# Patient Record
Sex: Female | Born: 1935 | Race: White | Hispanic: No | Marital: Married | Smoking: Former smoker
Health system: Southern US, Community
[De-identification: ages and names within clinical notes are randomized; demographics above are authoritative.]

## PROBLEM LIST (undated history)

## (undated) DIAGNOSIS — I480 Paroxysmal atrial fibrillation: Secondary | ICD-10-CM

## (undated) DIAGNOSIS — E785 Hyperlipidemia, unspecified: Secondary | ICD-10-CM

## (undated) DIAGNOSIS — C189 Malignant neoplasm of colon, unspecified: Secondary | ICD-10-CM

## (undated) DIAGNOSIS — E039 Hypothyroidism, unspecified: Secondary | ICD-10-CM

## (undated) DIAGNOSIS — I6529 Occlusion and stenosis of unspecified carotid artery: Secondary | ICD-10-CM

## (undated) DIAGNOSIS — M1990 Unspecified osteoarthritis, unspecified site: Secondary | ICD-10-CM

## (undated) DIAGNOSIS — Z808 Family history of malignant neoplasm of other organs or systems: Secondary | ICD-10-CM

## (undated) DIAGNOSIS — Z87891 Personal history of nicotine dependence: Secondary | ICD-10-CM

## (undated) DIAGNOSIS — Z8673 Personal history of transient ischemic attack (TIA), and cerebral infarction without residual deficits: Secondary | ICD-10-CM

## (undated) DIAGNOSIS — Z952 Presence of prosthetic heart valve: Secondary | ICD-10-CM

## (undated) DIAGNOSIS — I350 Nonrheumatic aortic (valve) stenosis: Secondary | ICD-10-CM

## (undated) DIAGNOSIS — I4510 Unspecified right bundle-branch block: Secondary | ICD-10-CM

## (undated) DIAGNOSIS — I10 Essential (primary) hypertension: Secondary | ICD-10-CM

## (undated) DIAGNOSIS — I5032 Chronic diastolic (congestive) heart failure: Secondary | ICD-10-CM

## (undated) DIAGNOSIS — I739 Peripheral vascular disease, unspecified: Secondary | ICD-10-CM

## (undated) HISTORY — DX: Family history of malignant neoplasm of other organs or systems: Z80.8

## (undated) HISTORY — DX: Personal history of nicotine dependence: Z87.891

## (undated) HISTORY — DX: Peripheral vascular disease, unspecified: I73.9

## (undated) HISTORY — PX: CARDIAC CATHETERIZATION: SHX172

## (undated) HISTORY — DX: Hypothyroidism, unspecified: E03.9

## (undated) HISTORY — PX: TONSILLECTOMY: SUR1361

## (undated) HISTORY — DX: Essential (primary) hypertension: I10

## (undated) HISTORY — DX: Malignant neoplasm of colon, unspecified: C18.9

## (undated) HISTORY — DX: Occlusion and stenosis of unspecified carotid artery: I65.29

## (undated) HISTORY — DX: Unspecified osteoarthritis, unspecified site: M19.90

## (undated) HISTORY — DX: Paroxysmal atrial fibrillation: I48.0

## (undated) HISTORY — DX: Hyperlipidemia, unspecified: E78.5

---

## 1996-01-12 HISTORY — PX: KNEE SURGERY: SHX244

## 1999-01-12 HISTORY — PX: COLONOSCOPY: SHX174

## 2003-01-12 HISTORY — PX: HIP SURGERY: SHX245

## 2009-01-11 DIAGNOSIS — Z8673 Personal history of transient ischemic attack (TIA), and cerebral infarction without residual deficits: Secondary | ICD-10-CM

## 2009-01-11 HISTORY — DX: Personal history of transient ischemic attack (TIA), and cerebral infarction without residual deficits: Z86.73

## 2015-01-20 DIAGNOSIS — D6851 Activated protein C resistance: Secondary | ICD-10-CM | POA: Diagnosis not present

## 2015-01-20 DIAGNOSIS — Z7901 Long term (current) use of anticoagulants: Secondary | ICD-10-CM | POA: Diagnosis not present

## 2015-01-23 DIAGNOSIS — E7801 Familial hypercholesterolemia: Secondary | ICD-10-CM | POA: Diagnosis not present

## 2015-01-23 DIAGNOSIS — E871 Hypo-osmolality and hyponatremia: Secondary | ICD-10-CM | POA: Diagnosis not present

## 2015-01-23 DIAGNOSIS — Z7901 Long term (current) use of anticoagulants: Secondary | ICD-10-CM | POA: Diagnosis not present

## 2015-01-23 DIAGNOSIS — E039 Hypothyroidism, unspecified: Secondary | ICD-10-CM | POA: Diagnosis not present

## 2015-02-04 DIAGNOSIS — M1712 Unilateral primary osteoarthritis, left knee: Secondary | ICD-10-CM | POA: Diagnosis not present

## 2015-02-11 DIAGNOSIS — M79674 Pain in right toe(s): Secondary | ICD-10-CM | POA: Diagnosis not present

## 2015-02-11 DIAGNOSIS — M79675 Pain in left toe(s): Secondary | ICD-10-CM | POA: Diagnosis not present

## 2015-02-11 DIAGNOSIS — B351 Tinea unguium: Secondary | ICD-10-CM | POA: Diagnosis not present

## 2015-02-26 DIAGNOSIS — L299 Pruritus, unspecified: Secondary | ICD-10-CM | POA: Diagnosis not present

## 2015-02-26 DIAGNOSIS — L821 Other seborrheic keratosis: Secondary | ICD-10-CM | POA: Diagnosis not present

## 2015-03-18 DIAGNOSIS — D6851 Activated protein C resistance: Secondary | ICD-10-CM | POA: Diagnosis not present

## 2015-03-18 DIAGNOSIS — Z7901 Long term (current) use of anticoagulants: Secondary | ICD-10-CM | POA: Diagnosis not present

## 2015-04-03 DIAGNOSIS — Z483 Aftercare following surgery for neoplasm: Secondary | ICD-10-CM | POA: Diagnosis not present

## 2015-04-03 DIAGNOSIS — Z85828 Personal history of other malignant neoplasm of skin: Secondary | ICD-10-CM | POA: Diagnosis not present

## 2015-04-15 DIAGNOSIS — D151 Benign neoplasm of heart: Secondary | ICD-10-CM | POA: Diagnosis not present

## 2015-04-15 DIAGNOSIS — I350 Nonrheumatic aortic (valve) stenosis: Secondary | ICD-10-CM | POA: Diagnosis not present

## 2015-04-15 DIAGNOSIS — I352 Nonrheumatic aortic (valve) stenosis with insufficiency: Secondary | ICD-10-CM | POA: Diagnosis not present

## 2015-04-15 DIAGNOSIS — I340 Nonrheumatic mitral (valve) insufficiency: Secondary | ICD-10-CM | POA: Diagnosis not present

## 2015-04-15 DIAGNOSIS — I10 Essential (primary) hypertension: Secondary | ICD-10-CM | POA: Diagnosis not present

## 2015-04-15 DIAGNOSIS — I342 Nonrheumatic mitral (valve) stenosis: Secondary | ICD-10-CM | POA: Diagnosis not present

## 2015-04-23 DIAGNOSIS — M79674 Pain in right toe(s): Secondary | ICD-10-CM | POA: Diagnosis not present

## 2015-04-23 DIAGNOSIS — M79675 Pain in left toe(s): Secondary | ICD-10-CM | POA: Diagnosis not present

## 2015-04-23 DIAGNOSIS — B351 Tinea unguium: Secondary | ICD-10-CM | POA: Diagnosis not present

## 2015-05-05 DIAGNOSIS — L821 Other seborrheic keratosis: Secondary | ICD-10-CM | POA: Diagnosis not present

## 2015-05-05 DIAGNOSIS — L299 Pruritus, unspecified: Secondary | ICD-10-CM | POA: Diagnosis not present

## 2015-05-05 DIAGNOSIS — L110 Acquired keratosis follicularis: Secondary | ICD-10-CM | POA: Diagnosis not present

## 2015-05-12 DIAGNOSIS — D6851 Activated protein C resistance: Secondary | ICD-10-CM | POA: Diagnosis not present

## 2015-05-12 DIAGNOSIS — Z7901 Long term (current) use of anticoagulants: Secondary | ICD-10-CM | POA: Diagnosis not present

## 2015-06-03 DIAGNOSIS — D2371 Other benign neoplasm of skin of right lower limb, including hip: Secondary | ICD-10-CM | POA: Diagnosis not present

## 2015-06-03 DIAGNOSIS — Z85828 Personal history of other malignant neoplasm of skin: Secondary | ICD-10-CM | POA: Diagnosis not present

## 2015-06-23 DIAGNOSIS — I350 Nonrheumatic aortic (valve) stenosis: Secondary | ICD-10-CM | POA: Diagnosis not present

## 2015-06-23 DIAGNOSIS — D151 Benign neoplasm of heart: Secondary | ICD-10-CM | POA: Diagnosis not present

## 2015-06-23 DIAGNOSIS — I340 Nonrheumatic mitral (valve) insufficiency: Secondary | ICD-10-CM | POA: Diagnosis not present

## 2015-06-23 DIAGNOSIS — I10 Essential (primary) hypertension: Secondary | ICD-10-CM | POA: Diagnosis not present

## 2015-07-02 DIAGNOSIS — M79674 Pain in right toe(s): Secondary | ICD-10-CM | POA: Diagnosis not present

## 2015-07-02 DIAGNOSIS — B351 Tinea unguium: Secondary | ICD-10-CM | POA: Diagnosis not present

## 2015-07-02 DIAGNOSIS — M79675 Pain in left toe(s): Secondary | ICD-10-CM | POA: Diagnosis not present

## 2015-07-17 DIAGNOSIS — D6851 Activated protein C resistance: Secondary | ICD-10-CM | POA: Diagnosis not present

## 2015-07-17 DIAGNOSIS — Z7901 Long term (current) use of anticoagulants: Secondary | ICD-10-CM | POA: Diagnosis not present

## 2015-07-21 DIAGNOSIS — E669 Obesity, unspecified: Secondary | ICD-10-CM | POA: Diagnosis not present

## 2015-07-21 DIAGNOSIS — Z8673 Personal history of transient ischemic attack (TIA), and cerebral infarction without residual deficits: Secondary | ICD-10-CM | POA: Diagnosis not present

## 2015-07-21 DIAGNOSIS — E7800 Pure hypercholesterolemia, unspecified: Secondary | ICD-10-CM | POA: Diagnosis not present

## 2015-07-21 DIAGNOSIS — I340 Nonrheumatic mitral (valve) insufficiency: Secondary | ICD-10-CM | POA: Diagnosis not present

## 2015-07-21 DIAGNOSIS — Z6837 Body mass index (BMI) 37.0-37.9, adult: Secondary | ICD-10-CM | POA: Diagnosis not present

## 2015-07-21 DIAGNOSIS — M549 Dorsalgia, unspecified: Secondary | ICD-10-CM | POA: Diagnosis not present

## 2015-07-21 DIAGNOSIS — Z79899 Other long term (current) drug therapy: Secondary | ICD-10-CM | POA: Diagnosis not present

## 2015-07-21 DIAGNOSIS — W19XXXA Unspecified fall, initial encounter: Secondary | ICD-10-CM | POA: Diagnosis not present

## 2015-07-21 DIAGNOSIS — N390 Urinary tract infection, site not specified: Secondary | ICD-10-CM | POA: Diagnosis not present

## 2015-07-21 DIAGNOSIS — Y9289 Other specified places as the place of occurrence of the external cause: Secondary | ICD-10-CM | POA: Diagnosis not present

## 2015-07-21 DIAGNOSIS — I350 Nonrheumatic aortic (valve) stenosis: Secondary | ICD-10-CM | POA: Diagnosis not present

## 2015-07-21 DIAGNOSIS — S32019A Unspecified fracture of first lumbar vertebra, initial encounter for closed fracture: Secondary | ICD-10-CM | POA: Diagnosis not present

## 2015-07-21 DIAGNOSIS — E039 Hypothyroidism, unspecified: Secondary | ICD-10-CM | POA: Diagnosis not present

## 2015-07-21 DIAGNOSIS — S32000A Wedge compression fracture of unspecified lumbar vertebra, initial encounter for closed fracture: Secondary | ICD-10-CM | POA: Diagnosis not present

## 2015-07-21 DIAGNOSIS — Y9301 Activity, walking, marching and hiking: Secondary | ICD-10-CM | POA: Diagnosis not present

## 2015-07-21 DIAGNOSIS — M545 Low back pain: Secondary | ICD-10-CM | POA: Diagnosis not present

## 2015-07-21 DIAGNOSIS — I10 Essential (primary) hypertension: Secondary | ICD-10-CM | POA: Diagnosis not present

## 2015-07-21 DIAGNOSIS — S32010A Wedge compression fracture of first lumbar vertebra, initial encounter for closed fracture: Secondary | ICD-10-CM | POA: Diagnosis not present

## 2015-07-21 DIAGNOSIS — W010XXA Fall on same level from slipping, tripping and stumbling without subsequent striking against object, initial encounter: Secondary | ICD-10-CM | POA: Diagnosis not present

## 2015-07-21 DIAGNOSIS — Z7901 Long term (current) use of anticoagulants: Secondary | ICD-10-CM | POA: Diagnosis not present

## 2015-07-22 DIAGNOSIS — S32010D Wedge compression fracture of first lumbar vertebra, subsequent encounter for fracture with routine healing: Secondary | ICD-10-CM | POA: Diagnosis not present

## 2015-07-23 DIAGNOSIS — S32010A Wedge compression fracture of first lumbar vertebra, initial encounter for closed fracture: Secondary | ICD-10-CM | POA: Diagnosis not present

## 2015-07-24 DIAGNOSIS — N390 Urinary tract infection, site not specified: Secondary | ICD-10-CM | POA: Diagnosis not present

## 2015-07-24 DIAGNOSIS — E785 Hyperlipidemia, unspecified: Secondary | ICD-10-CM | POA: Diagnosis not present

## 2015-07-24 DIAGNOSIS — I69331 Monoplegia of upper limb following cerebral infarction affecting right dominant side: Secondary | ICD-10-CM | POA: Diagnosis not present

## 2015-07-24 DIAGNOSIS — Z7901 Long term (current) use of anticoagulants: Secondary | ICD-10-CM | POA: Diagnosis not present

## 2015-07-24 DIAGNOSIS — I080 Rheumatic disorders of both mitral and aortic valves: Secondary | ICD-10-CM | POA: Diagnosis not present

## 2015-07-24 DIAGNOSIS — S32010D Wedge compression fracture of first lumbar vertebra, subsequent encounter for fracture with routine healing: Secondary | ICD-10-CM | POA: Diagnosis not present

## 2015-07-24 DIAGNOSIS — I10 Essential (primary) hypertension: Secondary | ICD-10-CM | POA: Diagnosis not present

## 2015-07-24 DIAGNOSIS — E039 Hypothyroidism, unspecified: Secondary | ICD-10-CM | POA: Diagnosis not present

## 2015-07-24 DIAGNOSIS — M5137 Other intervertebral disc degeneration, lumbosacral region: Secondary | ICD-10-CM | POA: Diagnosis not present

## 2015-07-24 DIAGNOSIS — D151 Benign neoplasm of heart: Secondary | ICD-10-CM | POA: Diagnosis not present

## 2015-07-24 DIAGNOSIS — Z9181 History of falling: Secondary | ICD-10-CM | POA: Diagnosis not present

## 2015-07-25 DIAGNOSIS — M5137 Other intervertebral disc degeneration, lumbosacral region: Secondary | ICD-10-CM | POA: Diagnosis not present

## 2015-07-25 DIAGNOSIS — N390 Urinary tract infection, site not specified: Secondary | ICD-10-CM | POA: Diagnosis not present

## 2015-07-25 DIAGNOSIS — D151 Benign neoplasm of heart: Secondary | ICD-10-CM | POA: Diagnosis not present

## 2015-07-25 DIAGNOSIS — I080 Rheumatic disorders of both mitral and aortic valves: Secondary | ICD-10-CM | POA: Diagnosis not present

## 2015-07-25 DIAGNOSIS — Z7901 Long term (current) use of anticoagulants: Secondary | ICD-10-CM | POA: Diagnosis not present

## 2015-07-25 DIAGNOSIS — S32010D Wedge compression fracture of first lumbar vertebra, subsequent encounter for fracture with routine healing: Secondary | ICD-10-CM | POA: Diagnosis not present

## 2015-07-25 DIAGNOSIS — E039 Hypothyroidism, unspecified: Secondary | ICD-10-CM | POA: Diagnosis not present

## 2015-07-25 DIAGNOSIS — I69331 Monoplegia of upper limb following cerebral infarction affecting right dominant side: Secondary | ICD-10-CM | POA: Diagnosis not present

## 2015-07-28 DIAGNOSIS — S32010D Wedge compression fracture of first lumbar vertebra, subsequent encounter for fracture with routine healing: Secondary | ICD-10-CM | POA: Diagnosis not present

## 2015-07-28 DIAGNOSIS — N390 Urinary tract infection, site not specified: Secondary | ICD-10-CM | POA: Diagnosis not present

## 2015-07-28 DIAGNOSIS — I69331 Monoplegia of upper limb following cerebral infarction affecting right dominant side: Secondary | ICD-10-CM | POA: Diagnosis not present

## 2015-07-28 DIAGNOSIS — D151 Benign neoplasm of heart: Secondary | ICD-10-CM | POA: Diagnosis not present

## 2015-07-28 DIAGNOSIS — I080 Rheumatic disorders of both mitral and aortic valves: Secondary | ICD-10-CM | POA: Diagnosis not present

## 2015-07-28 DIAGNOSIS — Z7901 Long term (current) use of anticoagulants: Secondary | ICD-10-CM | POA: Diagnosis not present

## 2015-07-28 DIAGNOSIS — M5137 Other intervertebral disc degeneration, lumbosacral region: Secondary | ICD-10-CM | POA: Diagnosis not present

## 2015-07-29 DIAGNOSIS — S32010D Wedge compression fracture of first lumbar vertebra, subsequent encounter for fracture with routine healing: Secondary | ICD-10-CM | POA: Diagnosis not present

## 2015-07-29 DIAGNOSIS — I69331 Monoplegia of upper limb following cerebral infarction affecting right dominant side: Secondary | ICD-10-CM | POA: Diagnosis not present

## 2015-07-29 DIAGNOSIS — D151 Benign neoplasm of heart: Secondary | ICD-10-CM | POA: Diagnosis not present

## 2015-07-29 DIAGNOSIS — I080 Rheumatic disorders of both mitral and aortic valves: Secondary | ICD-10-CM | POA: Diagnosis not present

## 2015-07-29 DIAGNOSIS — N390 Urinary tract infection, site not specified: Secondary | ICD-10-CM | POA: Diagnosis not present

## 2015-07-29 DIAGNOSIS — M5137 Other intervertebral disc degeneration, lumbosacral region: Secondary | ICD-10-CM | POA: Diagnosis not present

## 2015-07-30 DIAGNOSIS — S32010A Wedge compression fracture of first lumbar vertebra, initial encounter for closed fracture: Secondary | ICD-10-CM | POA: Diagnosis not present

## 2015-07-30 DIAGNOSIS — W1839XA Other fall on same level, initial encounter: Secondary | ICD-10-CM | POA: Diagnosis not present

## 2015-07-31 DIAGNOSIS — D151 Benign neoplasm of heart: Secondary | ICD-10-CM | POA: Diagnosis not present

## 2015-07-31 DIAGNOSIS — I69331 Monoplegia of upper limb following cerebral infarction affecting right dominant side: Secondary | ICD-10-CM | POA: Diagnosis not present

## 2015-07-31 DIAGNOSIS — S32010D Wedge compression fracture of first lumbar vertebra, subsequent encounter for fracture with routine healing: Secondary | ICD-10-CM | POA: Diagnosis not present

## 2015-07-31 DIAGNOSIS — I080 Rheumatic disorders of both mitral and aortic valves: Secondary | ICD-10-CM | POA: Diagnosis not present

## 2015-07-31 DIAGNOSIS — M5137 Other intervertebral disc degeneration, lumbosacral region: Secondary | ICD-10-CM | POA: Diagnosis not present

## 2015-07-31 DIAGNOSIS — N390 Urinary tract infection, site not specified: Secondary | ICD-10-CM | POA: Diagnosis not present

## 2015-08-01 DIAGNOSIS — S32010D Wedge compression fracture of first lumbar vertebra, subsequent encounter for fracture with routine healing: Secondary | ICD-10-CM | POA: Diagnosis not present

## 2015-08-01 DIAGNOSIS — N390 Urinary tract infection, site not specified: Secondary | ICD-10-CM | POA: Diagnosis not present

## 2015-08-01 DIAGNOSIS — M5137 Other intervertebral disc degeneration, lumbosacral region: Secondary | ICD-10-CM | POA: Diagnosis not present

## 2015-08-01 DIAGNOSIS — I69331 Monoplegia of upper limb following cerebral infarction affecting right dominant side: Secondary | ICD-10-CM | POA: Diagnosis not present

## 2015-08-01 DIAGNOSIS — I080 Rheumatic disorders of both mitral and aortic valves: Secondary | ICD-10-CM | POA: Diagnosis not present

## 2015-08-01 DIAGNOSIS — D151 Benign neoplasm of heart: Secondary | ICD-10-CM | POA: Diagnosis not present

## 2015-08-04 DIAGNOSIS — S32010D Wedge compression fracture of first lumbar vertebra, subsequent encounter for fracture with routine healing: Secondary | ICD-10-CM | POA: Diagnosis not present

## 2015-08-04 DIAGNOSIS — I69331 Monoplegia of upper limb following cerebral infarction affecting right dominant side: Secondary | ICD-10-CM | POA: Diagnosis not present

## 2015-08-04 DIAGNOSIS — M5137 Other intervertebral disc degeneration, lumbosacral region: Secondary | ICD-10-CM | POA: Diagnosis not present

## 2015-08-04 DIAGNOSIS — N390 Urinary tract infection, site not specified: Secondary | ICD-10-CM | POA: Diagnosis not present

## 2015-08-04 DIAGNOSIS — D151 Benign neoplasm of heart: Secondary | ICD-10-CM | POA: Diagnosis not present

## 2015-08-04 DIAGNOSIS — I080 Rheumatic disorders of both mitral and aortic valves: Secondary | ICD-10-CM | POA: Diagnosis not present

## 2015-08-05 DIAGNOSIS — N390 Urinary tract infection, site not specified: Secondary | ICD-10-CM | POA: Diagnosis not present

## 2015-08-05 DIAGNOSIS — I69331 Monoplegia of upper limb following cerebral infarction affecting right dominant side: Secondary | ICD-10-CM | POA: Diagnosis not present

## 2015-08-05 DIAGNOSIS — I080 Rheumatic disorders of both mitral and aortic valves: Secondary | ICD-10-CM | POA: Diagnosis not present

## 2015-08-05 DIAGNOSIS — S32010D Wedge compression fracture of first lumbar vertebra, subsequent encounter for fracture with routine healing: Secondary | ICD-10-CM | POA: Diagnosis not present

## 2015-08-05 DIAGNOSIS — M5137 Other intervertebral disc degeneration, lumbosacral region: Secondary | ICD-10-CM | POA: Diagnosis not present

## 2015-08-05 DIAGNOSIS — D151 Benign neoplasm of heart: Secondary | ICD-10-CM | POA: Diagnosis not present

## 2015-08-06 DIAGNOSIS — M5137 Other intervertebral disc degeneration, lumbosacral region: Secondary | ICD-10-CM | POA: Diagnosis not present

## 2015-08-06 DIAGNOSIS — I69331 Monoplegia of upper limb following cerebral infarction affecting right dominant side: Secondary | ICD-10-CM | POA: Diagnosis not present

## 2015-08-06 DIAGNOSIS — S32010D Wedge compression fracture of first lumbar vertebra, subsequent encounter for fracture with routine healing: Secondary | ICD-10-CM | POA: Diagnosis not present

## 2015-08-06 DIAGNOSIS — I080 Rheumatic disorders of both mitral and aortic valves: Secondary | ICD-10-CM | POA: Diagnosis not present

## 2015-08-06 DIAGNOSIS — D151 Benign neoplasm of heart: Secondary | ICD-10-CM | POA: Diagnosis not present

## 2015-08-06 DIAGNOSIS — N390 Urinary tract infection, site not specified: Secondary | ICD-10-CM | POA: Diagnosis not present

## 2015-08-07 DIAGNOSIS — N390 Urinary tract infection, site not specified: Secondary | ICD-10-CM | POA: Diagnosis not present

## 2015-08-07 DIAGNOSIS — S32010D Wedge compression fracture of first lumbar vertebra, subsequent encounter for fracture with routine healing: Secondary | ICD-10-CM | POA: Diagnosis not present

## 2015-08-07 DIAGNOSIS — I080 Rheumatic disorders of both mitral and aortic valves: Secondary | ICD-10-CM | POA: Diagnosis not present

## 2015-08-07 DIAGNOSIS — D151 Benign neoplasm of heart: Secondary | ICD-10-CM | POA: Diagnosis not present

## 2015-08-07 DIAGNOSIS — M5137 Other intervertebral disc degeneration, lumbosacral region: Secondary | ICD-10-CM | POA: Diagnosis not present

## 2015-08-07 DIAGNOSIS — I69331 Monoplegia of upper limb following cerebral infarction affecting right dominant side: Secondary | ICD-10-CM | POA: Diagnosis not present

## 2015-08-08 DIAGNOSIS — I080 Rheumatic disorders of both mitral and aortic valves: Secondary | ICD-10-CM | POA: Diagnosis not present

## 2015-08-08 DIAGNOSIS — N390 Urinary tract infection, site not specified: Secondary | ICD-10-CM | POA: Diagnosis not present

## 2015-08-08 DIAGNOSIS — M5137 Other intervertebral disc degeneration, lumbosacral region: Secondary | ICD-10-CM | POA: Diagnosis not present

## 2015-08-08 DIAGNOSIS — S32010D Wedge compression fracture of first lumbar vertebra, subsequent encounter for fracture with routine healing: Secondary | ICD-10-CM | POA: Diagnosis not present

## 2015-08-08 DIAGNOSIS — D151 Benign neoplasm of heart: Secondary | ICD-10-CM | POA: Diagnosis not present

## 2015-08-08 DIAGNOSIS — I69331 Monoplegia of upper limb following cerebral infarction affecting right dominant side: Secondary | ICD-10-CM | POA: Diagnosis not present

## 2015-08-11 DIAGNOSIS — I080 Rheumatic disorders of both mitral and aortic valves: Secondary | ICD-10-CM | POA: Diagnosis not present

## 2015-08-11 DIAGNOSIS — S32010D Wedge compression fracture of first lumbar vertebra, subsequent encounter for fracture with routine healing: Secondary | ICD-10-CM | POA: Diagnosis not present

## 2015-08-11 DIAGNOSIS — D151 Benign neoplasm of heart: Secondary | ICD-10-CM | POA: Diagnosis not present

## 2015-08-11 DIAGNOSIS — M5137 Other intervertebral disc degeneration, lumbosacral region: Secondary | ICD-10-CM | POA: Diagnosis not present

## 2015-08-11 DIAGNOSIS — I69331 Monoplegia of upper limb following cerebral infarction affecting right dominant side: Secondary | ICD-10-CM | POA: Diagnosis not present

## 2015-08-11 DIAGNOSIS — N390 Urinary tract infection, site not specified: Secondary | ICD-10-CM | POA: Diagnosis not present

## 2015-08-13 DIAGNOSIS — S32010D Wedge compression fracture of first lumbar vertebra, subsequent encounter for fracture with routine healing: Secondary | ICD-10-CM | POA: Diagnosis not present

## 2015-08-13 DIAGNOSIS — N390 Urinary tract infection, site not specified: Secondary | ICD-10-CM | POA: Diagnosis not present

## 2015-08-13 DIAGNOSIS — M5137 Other intervertebral disc degeneration, lumbosacral region: Secondary | ICD-10-CM | POA: Diagnosis not present

## 2015-08-13 DIAGNOSIS — I69331 Monoplegia of upper limb following cerebral infarction affecting right dominant side: Secondary | ICD-10-CM | POA: Diagnosis not present

## 2015-08-13 DIAGNOSIS — D151 Benign neoplasm of heart: Secondary | ICD-10-CM | POA: Diagnosis not present

## 2015-08-13 DIAGNOSIS — I080 Rheumatic disorders of both mitral and aortic valves: Secondary | ICD-10-CM | POA: Diagnosis not present

## 2015-08-14 DIAGNOSIS — M5137 Other intervertebral disc degeneration, lumbosacral region: Secondary | ICD-10-CM | POA: Diagnosis not present

## 2015-08-14 DIAGNOSIS — I080 Rheumatic disorders of both mitral and aortic valves: Secondary | ICD-10-CM | POA: Diagnosis not present

## 2015-08-14 DIAGNOSIS — S32010D Wedge compression fracture of first lumbar vertebra, subsequent encounter for fracture with routine healing: Secondary | ICD-10-CM | POA: Diagnosis not present

## 2015-08-14 DIAGNOSIS — N390 Urinary tract infection, site not specified: Secondary | ICD-10-CM | POA: Diagnosis not present

## 2015-08-14 DIAGNOSIS — I69331 Monoplegia of upper limb following cerebral infarction affecting right dominant side: Secondary | ICD-10-CM | POA: Diagnosis not present

## 2015-08-14 DIAGNOSIS — D151 Benign neoplasm of heart: Secondary | ICD-10-CM | POA: Diagnosis not present

## 2015-08-15 DIAGNOSIS — I69331 Monoplegia of upper limb following cerebral infarction affecting right dominant side: Secondary | ICD-10-CM | POA: Diagnosis not present

## 2015-08-15 DIAGNOSIS — D151 Benign neoplasm of heart: Secondary | ICD-10-CM | POA: Diagnosis not present

## 2015-08-15 DIAGNOSIS — N390 Urinary tract infection, site not specified: Secondary | ICD-10-CM | POA: Diagnosis not present

## 2015-08-15 DIAGNOSIS — S32010D Wedge compression fracture of first lumbar vertebra, subsequent encounter for fracture with routine healing: Secondary | ICD-10-CM | POA: Diagnosis not present

## 2015-08-15 DIAGNOSIS — M5137 Other intervertebral disc degeneration, lumbosacral region: Secondary | ICD-10-CM | POA: Diagnosis not present

## 2015-08-15 DIAGNOSIS — I080 Rheumatic disorders of both mitral and aortic valves: Secondary | ICD-10-CM | POA: Diagnosis not present

## 2015-08-19 DIAGNOSIS — M5137 Other intervertebral disc degeneration, lumbosacral region: Secondary | ICD-10-CM | POA: Diagnosis not present

## 2015-08-19 DIAGNOSIS — N390 Urinary tract infection, site not specified: Secondary | ICD-10-CM | POA: Diagnosis not present

## 2015-08-19 DIAGNOSIS — D151 Benign neoplasm of heart: Secondary | ICD-10-CM | POA: Diagnosis not present

## 2015-08-19 DIAGNOSIS — S32010D Wedge compression fracture of first lumbar vertebra, subsequent encounter for fracture with routine healing: Secondary | ICD-10-CM | POA: Diagnosis not present

## 2015-08-19 DIAGNOSIS — I69331 Monoplegia of upper limb following cerebral infarction affecting right dominant side: Secondary | ICD-10-CM | POA: Diagnosis not present

## 2015-08-19 DIAGNOSIS — I080 Rheumatic disorders of both mitral and aortic valves: Secondary | ICD-10-CM | POA: Diagnosis not present

## 2015-08-20 DIAGNOSIS — I080 Rheumatic disorders of both mitral and aortic valves: Secondary | ICD-10-CM | POA: Diagnosis not present

## 2015-08-20 DIAGNOSIS — S32010D Wedge compression fracture of first lumbar vertebra, subsequent encounter for fracture with routine healing: Secondary | ICD-10-CM | POA: Diagnosis not present

## 2015-08-20 DIAGNOSIS — M5137 Other intervertebral disc degeneration, lumbosacral region: Secondary | ICD-10-CM | POA: Diagnosis not present

## 2015-08-20 DIAGNOSIS — D151 Benign neoplasm of heart: Secondary | ICD-10-CM | POA: Diagnosis not present

## 2015-08-20 DIAGNOSIS — N390 Urinary tract infection, site not specified: Secondary | ICD-10-CM | POA: Diagnosis not present

## 2015-08-20 DIAGNOSIS — I69331 Monoplegia of upper limb following cerebral infarction affecting right dominant side: Secondary | ICD-10-CM | POA: Diagnosis not present

## 2015-08-21 DIAGNOSIS — M5137 Other intervertebral disc degeneration, lumbosacral region: Secondary | ICD-10-CM | POA: Diagnosis not present

## 2015-08-21 DIAGNOSIS — D151 Benign neoplasm of heart: Secondary | ICD-10-CM | POA: Diagnosis not present

## 2015-08-21 DIAGNOSIS — I69331 Monoplegia of upper limb following cerebral infarction affecting right dominant side: Secondary | ICD-10-CM | POA: Diagnosis not present

## 2015-08-21 DIAGNOSIS — S32010D Wedge compression fracture of first lumbar vertebra, subsequent encounter for fracture with routine healing: Secondary | ICD-10-CM | POA: Diagnosis not present

## 2015-08-21 DIAGNOSIS — I080 Rheumatic disorders of both mitral and aortic valves: Secondary | ICD-10-CM | POA: Diagnosis not present

## 2015-08-21 DIAGNOSIS — N390 Urinary tract infection, site not specified: Secondary | ICD-10-CM | POA: Diagnosis not present

## 2015-08-25 DIAGNOSIS — E871 Hypo-osmolality and hyponatremia: Secondary | ICD-10-CM | POA: Diagnosis not present

## 2015-08-25 DIAGNOSIS — E7801 Familial hypercholesterolemia: Secondary | ICD-10-CM | POA: Diagnosis not present

## 2015-08-25 DIAGNOSIS — S32010D Wedge compression fracture of first lumbar vertebra, subsequent encounter for fracture with routine healing: Secondary | ICD-10-CM | POA: Diagnosis not present

## 2015-08-25 DIAGNOSIS — M545 Low back pain: Secondary | ICD-10-CM | POA: Diagnosis not present

## 2015-08-25 DIAGNOSIS — M5137 Other intervertebral disc degeneration, lumbosacral region: Secondary | ICD-10-CM | POA: Diagnosis not present

## 2015-08-25 DIAGNOSIS — E039 Hypothyroidism, unspecified: Secondary | ICD-10-CM | POA: Diagnosis not present

## 2015-08-25 DIAGNOSIS — S32000D Wedge compression fracture of unspecified lumbar vertebra, subsequent encounter for fracture with routine healing: Secondary | ICD-10-CM | POA: Diagnosis not present

## 2015-08-25 DIAGNOSIS — D151 Benign neoplasm of heart: Secondary | ICD-10-CM | POA: Diagnosis not present

## 2015-08-25 DIAGNOSIS — I080 Rheumatic disorders of both mitral and aortic valves: Secondary | ICD-10-CM | POA: Diagnosis not present

## 2015-08-25 DIAGNOSIS — I69359 Hemiplegia and hemiparesis following cerebral infarction affecting unspecified side: Secondary | ICD-10-CM | POA: Diagnosis not present

## 2015-08-25 DIAGNOSIS — I69331 Monoplegia of upper limb following cerebral infarction affecting right dominant side: Secondary | ICD-10-CM | POA: Diagnosis not present

## 2015-08-25 DIAGNOSIS — N390 Urinary tract infection, site not specified: Secondary | ICD-10-CM | POA: Diagnosis not present

## 2015-08-25 DIAGNOSIS — Z23 Encounter for immunization: Secondary | ICD-10-CM | POA: Diagnosis not present

## 2015-08-28 DIAGNOSIS — S32010D Wedge compression fracture of first lumbar vertebra, subsequent encounter for fracture with routine healing: Secondary | ICD-10-CM | POA: Diagnosis not present

## 2015-08-28 DIAGNOSIS — N390 Urinary tract infection, site not specified: Secondary | ICD-10-CM | POA: Diagnosis not present

## 2015-08-28 DIAGNOSIS — I080 Rheumatic disorders of both mitral and aortic valves: Secondary | ICD-10-CM | POA: Diagnosis not present

## 2015-08-28 DIAGNOSIS — D151 Benign neoplasm of heart: Secondary | ICD-10-CM | POA: Diagnosis not present

## 2015-08-28 DIAGNOSIS — M5137 Other intervertebral disc degeneration, lumbosacral region: Secondary | ICD-10-CM | POA: Diagnosis not present

## 2015-08-28 DIAGNOSIS — I69331 Monoplegia of upper limb following cerebral infarction affecting right dominant side: Secondary | ICD-10-CM | POA: Diagnosis not present

## 2015-09-01 DIAGNOSIS — D151 Benign neoplasm of heart: Secondary | ICD-10-CM | POA: Diagnosis not present

## 2015-09-01 DIAGNOSIS — M5137 Other intervertebral disc degeneration, lumbosacral region: Secondary | ICD-10-CM | POA: Diagnosis not present

## 2015-09-01 DIAGNOSIS — S32010D Wedge compression fracture of first lumbar vertebra, subsequent encounter for fracture with routine healing: Secondary | ICD-10-CM | POA: Diagnosis not present

## 2015-09-01 DIAGNOSIS — I69331 Monoplegia of upper limb following cerebral infarction affecting right dominant side: Secondary | ICD-10-CM | POA: Diagnosis not present

## 2015-09-01 DIAGNOSIS — I080 Rheumatic disorders of both mitral and aortic valves: Secondary | ICD-10-CM | POA: Diagnosis not present

## 2015-09-01 DIAGNOSIS — N390 Urinary tract infection, site not specified: Secondary | ICD-10-CM | POA: Diagnosis not present

## 2015-09-04 DIAGNOSIS — M5137 Other intervertebral disc degeneration, lumbosacral region: Secondary | ICD-10-CM | POA: Diagnosis not present

## 2015-09-04 DIAGNOSIS — N390 Urinary tract infection, site not specified: Secondary | ICD-10-CM | POA: Diagnosis not present

## 2015-09-04 DIAGNOSIS — D151 Benign neoplasm of heart: Secondary | ICD-10-CM | POA: Diagnosis not present

## 2015-09-04 DIAGNOSIS — S32010D Wedge compression fracture of first lumbar vertebra, subsequent encounter for fracture with routine healing: Secondary | ICD-10-CM | POA: Diagnosis not present

## 2015-09-04 DIAGNOSIS — I080 Rheumatic disorders of both mitral and aortic valves: Secondary | ICD-10-CM | POA: Diagnosis not present

## 2015-09-04 DIAGNOSIS — I69331 Monoplegia of upper limb following cerebral infarction affecting right dominant side: Secondary | ICD-10-CM | POA: Diagnosis not present

## 2015-09-08 DIAGNOSIS — D6851 Activated protein C resistance: Secondary | ICD-10-CM | POA: Diagnosis not present

## 2015-09-08 DIAGNOSIS — Z7901 Long term (current) use of anticoagulants: Secondary | ICD-10-CM | POA: Diagnosis not present

## 2015-09-10 DIAGNOSIS — I69398 Other sequelae of cerebral infarction: Secondary | ICD-10-CM | POA: Diagnosis not present

## 2015-09-10 DIAGNOSIS — S32010D Wedge compression fracture of first lumbar vertebra, subsequent encounter for fracture with routine healing: Secondary | ICD-10-CM | POA: Diagnosis not present

## 2015-09-10 DIAGNOSIS — R29818 Other symptoms and signs involving the nervous system: Secondary | ICD-10-CM | POA: Diagnosis not present

## 2015-09-11 DIAGNOSIS — S32010D Wedge compression fracture of first lumbar vertebra, subsequent encounter for fracture with routine healing: Secondary | ICD-10-CM | POA: Diagnosis not present

## 2015-09-11 DIAGNOSIS — M545 Low back pain: Secondary | ICD-10-CM | POA: Diagnosis not present

## 2015-09-11 DIAGNOSIS — R2681 Unsteadiness on feet: Secondary | ICD-10-CM | POA: Diagnosis not present

## 2015-09-16 DIAGNOSIS — M79674 Pain in right toe(s): Secondary | ICD-10-CM | POA: Diagnosis not present

## 2015-09-16 DIAGNOSIS — B351 Tinea unguium: Secondary | ICD-10-CM | POA: Diagnosis not present

## 2015-09-16 DIAGNOSIS — M79675 Pain in left toe(s): Secondary | ICD-10-CM | POA: Diagnosis not present

## 2015-09-17 DIAGNOSIS — S32010D Wedge compression fracture of first lumbar vertebra, subsequent encounter for fracture with routine healing: Secondary | ICD-10-CM | POA: Diagnosis not present

## 2015-09-17 DIAGNOSIS — R2681 Unsteadiness on feet: Secondary | ICD-10-CM | POA: Diagnosis not present

## 2015-09-17 DIAGNOSIS — M545 Low back pain: Secondary | ICD-10-CM | POA: Diagnosis not present

## 2015-09-20 DIAGNOSIS — S32010D Wedge compression fracture of first lumbar vertebra, subsequent encounter for fracture with routine healing: Secondary | ICD-10-CM | POA: Diagnosis not present

## 2015-09-20 DIAGNOSIS — M545 Low back pain: Secondary | ICD-10-CM | POA: Diagnosis not present

## 2015-09-20 DIAGNOSIS — R2681 Unsteadiness on feet: Secondary | ICD-10-CM | POA: Diagnosis not present

## 2015-09-22 DIAGNOSIS — S32010D Wedge compression fracture of first lumbar vertebra, subsequent encounter for fracture with routine healing: Secondary | ICD-10-CM | POA: Diagnosis not present

## 2015-09-22 DIAGNOSIS — M545 Low back pain: Secondary | ICD-10-CM | POA: Diagnosis not present

## 2015-09-22 DIAGNOSIS — R2681 Unsteadiness on feet: Secondary | ICD-10-CM | POA: Diagnosis not present

## 2015-09-24 DIAGNOSIS — M545 Low back pain: Secondary | ICD-10-CM | POA: Diagnosis not present

## 2015-09-24 DIAGNOSIS — R2681 Unsteadiness on feet: Secondary | ICD-10-CM | POA: Diagnosis not present

## 2015-09-24 DIAGNOSIS — S32010D Wedge compression fracture of first lumbar vertebra, subsequent encounter for fracture with routine healing: Secondary | ICD-10-CM | POA: Diagnosis not present

## 2015-09-29 DIAGNOSIS — R2681 Unsteadiness on feet: Secondary | ICD-10-CM | POA: Diagnosis not present

## 2015-09-29 DIAGNOSIS — S32010D Wedge compression fracture of first lumbar vertebra, subsequent encounter for fracture with routine healing: Secondary | ICD-10-CM | POA: Diagnosis not present

## 2015-09-29 DIAGNOSIS — M545 Low back pain: Secondary | ICD-10-CM | POA: Diagnosis not present

## 2015-10-01 DIAGNOSIS — M545 Low back pain: Secondary | ICD-10-CM | POA: Diagnosis not present

## 2015-10-01 DIAGNOSIS — S32010D Wedge compression fracture of first lumbar vertebra, subsequent encounter for fracture with routine healing: Secondary | ICD-10-CM | POA: Diagnosis not present

## 2015-10-01 DIAGNOSIS — R2681 Unsteadiness on feet: Secondary | ICD-10-CM | POA: Diagnosis not present

## 2015-10-06 DIAGNOSIS — R2681 Unsteadiness on feet: Secondary | ICD-10-CM | POA: Diagnosis not present

## 2015-10-06 DIAGNOSIS — S32010D Wedge compression fracture of first lumbar vertebra, subsequent encounter for fracture with routine healing: Secondary | ICD-10-CM | POA: Diagnosis not present

## 2015-10-06 DIAGNOSIS — M545 Low back pain: Secondary | ICD-10-CM | POA: Diagnosis not present

## 2015-10-31 DIAGNOSIS — Z09 Encounter for follow-up examination after completed treatment for conditions other than malignant neoplasm: Secondary | ICD-10-CM | POA: Diagnosis not present

## 2015-10-31 DIAGNOSIS — Z96642 Presence of left artificial hip joint: Secondary | ICD-10-CM | POA: Diagnosis not present

## 2015-11-03 DIAGNOSIS — Z7901 Long term (current) use of anticoagulants: Secondary | ICD-10-CM | POA: Diagnosis not present

## 2015-11-03 DIAGNOSIS — D6851 Activated protein C resistance: Secondary | ICD-10-CM | POA: Diagnosis not present

## 2015-11-18 DIAGNOSIS — M1712 Unilateral primary osteoarthritis, left knee: Secondary | ICD-10-CM | POA: Diagnosis not present

## 2015-11-25 DIAGNOSIS — M79675 Pain in left toe(s): Secondary | ICD-10-CM | POA: Diagnosis not present

## 2015-11-25 DIAGNOSIS — B351 Tinea unguium: Secondary | ICD-10-CM | POA: Diagnosis not present

## 2015-11-25 DIAGNOSIS — M79674 Pain in right toe(s): Secondary | ICD-10-CM | POA: Diagnosis not present

## 2015-12-31 DIAGNOSIS — D6851 Activated protein C resistance: Secondary | ICD-10-CM | POA: Diagnosis not present

## 2015-12-31 DIAGNOSIS — Z7901 Long term (current) use of anticoagulants: Secondary | ICD-10-CM | POA: Diagnosis not present

## 2016-01-12 HISTORY — PX: BASAL CELL CARCINOMA EXCISION: SHX1214

## 2016-02-03 DIAGNOSIS — M79674 Pain in right toe(s): Secondary | ICD-10-CM | POA: Diagnosis not present

## 2016-02-03 DIAGNOSIS — B351 Tinea unguium: Secondary | ICD-10-CM | POA: Diagnosis not present

## 2016-02-03 DIAGNOSIS — M79675 Pain in left toe(s): Secondary | ICD-10-CM | POA: Diagnosis not present

## 2016-02-24 DIAGNOSIS — Z7901 Long term (current) use of anticoagulants: Secondary | ICD-10-CM | POA: Diagnosis not present

## 2016-02-24 DIAGNOSIS — D6851 Activated protein C resistance: Secondary | ICD-10-CM | POA: Diagnosis not present

## 2016-02-25 DIAGNOSIS — R197 Diarrhea, unspecified: Secondary | ICD-10-CM | POA: Diagnosis not present

## 2016-02-25 DIAGNOSIS — I7389 Other specified peripheral vascular diseases: Secondary | ICD-10-CM | POA: Diagnosis not present

## 2016-02-25 DIAGNOSIS — I63032 Cerebral infarction due to thrombosis of left carotid artery: Secondary | ICD-10-CM | POA: Diagnosis not present

## 2016-02-25 DIAGNOSIS — Z0001 Encounter for general adult medical examination with abnormal findings: Secondary | ICD-10-CM | POA: Diagnosis not present

## 2016-02-25 DIAGNOSIS — E039 Hypothyroidism, unspecified: Secondary | ICD-10-CM | POA: Diagnosis not present

## 2016-02-25 DIAGNOSIS — B372 Candidiasis of skin and nail: Secondary | ICD-10-CM | POA: Diagnosis not present

## 2016-02-25 DIAGNOSIS — E7801 Familial hypercholesterolemia: Secondary | ICD-10-CM | POA: Diagnosis not present

## 2016-02-25 DIAGNOSIS — Z7901 Long term (current) use of anticoagulants: Secondary | ICD-10-CM | POA: Diagnosis not present

## 2016-02-25 DIAGNOSIS — E871 Hypo-osmolality and hyponatremia: Secondary | ICD-10-CM | POA: Diagnosis not present

## 2016-04-07 DIAGNOSIS — H40013 Open angle with borderline findings, low risk, bilateral: Secondary | ICD-10-CM | POA: Diagnosis not present

## 2016-04-07 DIAGNOSIS — Z961 Presence of intraocular lens: Secondary | ICD-10-CM | POA: Diagnosis not present

## 2016-04-13 DIAGNOSIS — B351 Tinea unguium: Secondary | ICD-10-CM | POA: Diagnosis not present

## 2016-04-13 DIAGNOSIS — M79675 Pain in left toe(s): Secondary | ICD-10-CM | POA: Diagnosis not present

## 2016-04-13 DIAGNOSIS — M79674 Pain in right toe(s): Secondary | ICD-10-CM | POA: Diagnosis not present

## 2016-04-19 DIAGNOSIS — Z7901 Long term (current) use of anticoagulants: Secondary | ICD-10-CM | POA: Diagnosis not present

## 2016-04-20 DIAGNOSIS — Z7901 Long term (current) use of anticoagulants: Secondary | ICD-10-CM | POA: Diagnosis not present

## 2016-04-20 DIAGNOSIS — D6851 Activated protein C resistance: Secondary | ICD-10-CM | POA: Diagnosis not present

## 2016-04-26 DIAGNOSIS — D485 Neoplasm of uncertain behavior of skin: Secondary | ICD-10-CM | POA: Diagnosis not present

## 2016-04-26 DIAGNOSIS — Z85828 Personal history of other malignant neoplasm of skin: Secondary | ICD-10-CM | POA: Diagnosis not present

## 2016-04-29 DIAGNOSIS — Z7901 Long term (current) use of anticoagulants: Secondary | ICD-10-CM | POA: Diagnosis not present

## 2016-04-29 DIAGNOSIS — D151 Benign neoplasm of heart: Secondary | ICD-10-CM | POA: Diagnosis not present

## 2016-04-29 DIAGNOSIS — I10 Essential (primary) hypertension: Secondary | ICD-10-CM | POA: Diagnosis not present

## 2016-04-29 DIAGNOSIS — I350 Nonrheumatic aortic (valve) stenosis: Secondary | ICD-10-CM | POA: Diagnosis not present

## 2016-04-29 DIAGNOSIS — I340 Nonrheumatic mitral (valve) insufficiency: Secondary | ICD-10-CM | POA: Diagnosis not present

## 2016-05-17 DIAGNOSIS — Z7901 Long term (current) use of anticoagulants: Secondary | ICD-10-CM | POA: Diagnosis not present

## 2016-06-14 DIAGNOSIS — Z7901 Long term (current) use of anticoagulants: Secondary | ICD-10-CM | POA: Diagnosis not present

## 2016-06-14 DIAGNOSIS — D6851 Activated protein C resistance: Secondary | ICD-10-CM | POA: Diagnosis not present

## 2016-06-22 DIAGNOSIS — M79674 Pain in right toe(s): Secondary | ICD-10-CM | POA: Diagnosis not present

## 2016-06-22 DIAGNOSIS — M79675 Pain in left toe(s): Secondary | ICD-10-CM | POA: Diagnosis not present

## 2016-06-22 DIAGNOSIS — B351 Tinea unguium: Secondary | ICD-10-CM | POA: Diagnosis not present

## 2016-07-12 DIAGNOSIS — Z7901 Long term (current) use of anticoagulants: Secondary | ICD-10-CM | POA: Diagnosis not present

## 2016-08-10 DIAGNOSIS — Z7901 Long term (current) use of anticoagulants: Secondary | ICD-10-CM | POA: Diagnosis not present

## 2016-08-10 DIAGNOSIS — D6851 Activated protein C resistance: Secondary | ICD-10-CM | POA: Diagnosis not present

## 2016-08-31 DIAGNOSIS — B351 Tinea unguium: Secondary | ICD-10-CM | POA: Diagnosis not present

## 2016-08-31 DIAGNOSIS — M79674 Pain in right toe(s): Secondary | ICD-10-CM | POA: Diagnosis not present

## 2016-08-31 DIAGNOSIS — M79675 Pain in left toe(s): Secondary | ICD-10-CM | POA: Diagnosis not present

## 2016-09-20 DIAGNOSIS — Z7901 Long term (current) use of anticoagulants: Secondary | ICD-10-CM | POA: Diagnosis not present

## 2016-10-04 DIAGNOSIS — D6851 Activated protein C resistance: Secondary | ICD-10-CM | POA: Diagnosis not present

## 2016-10-04 DIAGNOSIS — Z7901 Long term (current) use of anticoagulants: Secondary | ICD-10-CM | POA: Diagnosis not present

## 2016-10-06 DIAGNOSIS — M545 Low back pain: Secondary | ICD-10-CM | POA: Diagnosis not present

## 2016-10-15 DIAGNOSIS — M2560 Stiffness of unspecified joint, not elsewhere classified: Secondary | ICD-10-CM | POA: Diagnosis not present

## 2016-10-15 DIAGNOSIS — M545 Low back pain: Secondary | ICD-10-CM | POA: Diagnosis not present

## 2016-10-15 DIAGNOSIS — R2681 Unsteadiness on feet: Secondary | ICD-10-CM | POA: Diagnosis not present

## 2016-10-17 DIAGNOSIS — Z23 Encounter for immunization: Secondary | ICD-10-CM | POA: Diagnosis not present

## 2016-10-22 DIAGNOSIS — M2560 Stiffness of unspecified joint, not elsewhere classified: Secondary | ICD-10-CM | POA: Diagnosis not present

## 2016-10-22 DIAGNOSIS — R2681 Unsteadiness on feet: Secondary | ICD-10-CM | POA: Diagnosis not present

## 2016-10-22 DIAGNOSIS — M545 Low back pain: Secondary | ICD-10-CM | POA: Diagnosis not present

## 2016-10-26 DIAGNOSIS — R2681 Unsteadiness on feet: Secondary | ICD-10-CM | POA: Diagnosis not present

## 2016-10-26 DIAGNOSIS — M2560 Stiffness of unspecified joint, not elsewhere classified: Secondary | ICD-10-CM | POA: Diagnosis not present

## 2016-10-26 DIAGNOSIS — M545 Low back pain: Secondary | ICD-10-CM | POA: Diagnosis not present

## 2016-10-28 DIAGNOSIS — M545 Low back pain: Secondary | ICD-10-CM | POA: Diagnosis not present

## 2016-10-28 DIAGNOSIS — M2560 Stiffness of unspecified joint, not elsewhere classified: Secondary | ICD-10-CM | POA: Diagnosis not present

## 2016-10-28 DIAGNOSIS — R2681 Unsteadiness on feet: Secondary | ICD-10-CM | POA: Diagnosis not present

## 2016-11-01 DIAGNOSIS — Z7901 Long term (current) use of anticoagulants: Secondary | ICD-10-CM | POA: Diagnosis not present

## 2016-11-09 DIAGNOSIS — M2560 Stiffness of unspecified joint, not elsewhere classified: Secondary | ICD-10-CM | POA: Diagnosis not present

## 2016-11-09 DIAGNOSIS — R2681 Unsteadiness on feet: Secondary | ICD-10-CM | POA: Diagnosis not present

## 2016-11-09 DIAGNOSIS — D485 Neoplasm of uncertain behavior of skin: Secondary | ICD-10-CM | POA: Diagnosis not present

## 2016-11-09 DIAGNOSIS — M545 Low back pain: Secondary | ICD-10-CM | POA: Diagnosis not present

## 2016-11-11 DIAGNOSIS — R2681 Unsteadiness on feet: Secondary | ICD-10-CM | POA: Diagnosis not present

## 2016-11-11 DIAGNOSIS — M2560 Stiffness of unspecified joint, not elsewhere classified: Secondary | ICD-10-CM | POA: Diagnosis not present

## 2016-11-11 DIAGNOSIS — M545 Low back pain: Secondary | ICD-10-CM | POA: Diagnosis not present

## 2016-11-16 DIAGNOSIS — B351 Tinea unguium: Secondary | ICD-10-CM | POA: Diagnosis not present

## 2016-11-16 DIAGNOSIS — M79675 Pain in left toe(s): Secondary | ICD-10-CM | POA: Diagnosis not present

## 2016-11-16 DIAGNOSIS — M79674 Pain in right toe(s): Secondary | ICD-10-CM | POA: Diagnosis not present

## 2016-11-17 DIAGNOSIS — C4491 Basal cell carcinoma of skin, unspecified: Secondary | ICD-10-CM | POA: Diagnosis not present

## 2016-11-17 DIAGNOSIS — C44311 Basal cell carcinoma of skin of nose: Secondary | ICD-10-CM | POA: Diagnosis not present

## 2016-11-22 DIAGNOSIS — Z7901 Long term (current) use of anticoagulants: Secondary | ICD-10-CM | POA: Diagnosis not present

## 2016-11-24 DIAGNOSIS — Z859 Personal history of malignant neoplasm, unspecified: Secondary | ICD-10-CM | POA: Diagnosis not present

## 2016-11-24 DIAGNOSIS — C44311 Basal cell carcinoma of skin of nose: Secondary | ICD-10-CM | POA: Diagnosis not present

## 2016-12-06 DIAGNOSIS — Z7901 Long term (current) use of anticoagulants: Secondary | ICD-10-CM | POA: Diagnosis not present

## 2016-12-21 DIAGNOSIS — Z7901 Long term (current) use of anticoagulants: Secondary | ICD-10-CM | POA: Diagnosis not present

## 2016-12-21 DIAGNOSIS — D6851 Activated protein C resistance: Secondary | ICD-10-CM | POA: Diagnosis not present

## 2017-01-25 DIAGNOSIS — B351 Tinea unguium: Secondary | ICD-10-CM | POA: Diagnosis not present

## 2017-01-25 DIAGNOSIS — M79674 Pain in right toe(s): Secondary | ICD-10-CM | POA: Diagnosis not present

## 2017-01-25 DIAGNOSIS — M79675 Pain in left toe(s): Secondary | ICD-10-CM | POA: Diagnosis not present

## 2017-02-18 DIAGNOSIS — E7801 Familial hypercholesterolemia: Secondary | ICD-10-CM | POA: Diagnosis not present

## 2017-02-18 DIAGNOSIS — I69359 Hemiplegia and hemiparesis following cerebral infarction affecting unspecified side: Secondary | ICD-10-CM | POA: Diagnosis not present

## 2017-02-18 DIAGNOSIS — I10 Essential (primary) hypertension: Secondary | ICD-10-CM | POA: Diagnosis not present

## 2017-02-18 DIAGNOSIS — E039 Hypothyroidism, unspecified: Secondary | ICD-10-CM | POA: Diagnosis not present

## 2017-02-18 DIAGNOSIS — Z7901 Long term (current) use of anticoagulants: Secondary | ICD-10-CM | POA: Diagnosis not present

## 2017-02-18 DIAGNOSIS — Z6839 Body mass index (BMI) 39.0-39.9, adult: Secondary | ICD-10-CM | POA: Diagnosis not present

## 2017-02-18 DIAGNOSIS — I350 Nonrheumatic aortic (valve) stenosis: Secondary | ICD-10-CM | POA: Diagnosis not present

## 2017-02-18 DIAGNOSIS — B372 Candidiasis of skin and nail: Secondary | ICD-10-CM | POA: Diagnosis not present

## 2017-02-18 DIAGNOSIS — E871 Hypo-osmolality and hyponatremia: Secondary | ICD-10-CM | POA: Diagnosis not present

## 2017-02-21 DIAGNOSIS — D6851 Activated protein C resistance: Secondary | ICD-10-CM | POA: Diagnosis not present

## 2017-02-21 DIAGNOSIS — Z7901 Long term (current) use of anticoagulants: Secondary | ICD-10-CM | POA: Diagnosis not present

## 2017-03-07 DIAGNOSIS — Z7901 Long term (current) use of anticoagulants: Secondary | ICD-10-CM | POA: Diagnosis not present

## 2017-03-08 DIAGNOSIS — I340 Nonrheumatic mitral (valve) insufficiency: Secondary | ICD-10-CM | POA: Diagnosis not present

## 2017-03-08 DIAGNOSIS — I361 Nonrheumatic tricuspid (valve) insufficiency: Secondary | ICD-10-CM | POA: Diagnosis not present

## 2017-03-08 DIAGNOSIS — D151 Benign neoplasm of heart: Secondary | ICD-10-CM | POA: Diagnosis not present

## 2017-03-08 DIAGNOSIS — I517 Cardiomegaly: Secondary | ICD-10-CM | POA: Diagnosis not present

## 2017-03-08 DIAGNOSIS — I10 Essential (primary) hypertension: Secondary | ICD-10-CM | POA: Diagnosis not present

## 2017-03-08 DIAGNOSIS — I350 Nonrheumatic aortic (valve) stenosis: Secondary | ICD-10-CM | POA: Diagnosis not present

## 2017-03-08 DIAGNOSIS — Z7901 Long term (current) use of anticoagulants: Secondary | ICD-10-CM | POA: Diagnosis not present

## 2017-04-14 DIAGNOSIS — I10 Essential (primary) hypertension: Secondary | ICD-10-CM | POA: Diagnosis not present

## 2017-04-14 DIAGNOSIS — E039 Hypothyroidism, unspecified: Secondary | ICD-10-CM | POA: Diagnosis not present

## 2017-04-14 DIAGNOSIS — Z0000 Encounter for general adult medical examination without abnormal findings: Secondary | ICD-10-CM | POA: Diagnosis not present

## 2017-04-14 DIAGNOSIS — I639 Cerebral infarction, unspecified: Secondary | ICD-10-CM | POA: Diagnosis not present

## 2017-04-14 DIAGNOSIS — Z5181 Encounter for therapeutic drug level monitoring: Secondary | ICD-10-CM | POA: Diagnosis not present

## 2017-04-14 DIAGNOSIS — Z7901 Long term (current) use of anticoagulants: Secondary | ICD-10-CM | POA: Diagnosis not present

## 2017-04-14 DIAGNOSIS — I2510 Atherosclerotic heart disease of native coronary artery without angina pectoris: Secondary | ICD-10-CM | POA: Diagnosis not present

## 2017-04-25 DIAGNOSIS — D6851 Activated protein C resistance: Secondary | ICD-10-CM | POA: Diagnosis not present

## 2017-04-25 DIAGNOSIS — Z7901 Long term (current) use of anticoagulants: Secondary | ICD-10-CM | POA: Diagnosis not present

## 2017-05-13 ENCOUNTER — Other Ambulatory Visit: Payer: Self-pay

## 2017-05-13 DIAGNOSIS — E039 Hypothyroidism, unspecified: Secondary | ICD-10-CM | POA: Insufficient documentation

## 2017-05-13 DIAGNOSIS — I10 Essential (primary) hypertension: Secondary | ICD-10-CM | POA: Insufficient documentation

## 2017-05-13 DIAGNOSIS — E785 Hyperlipidemia, unspecified: Secondary | ICD-10-CM | POA: Insufficient documentation

## 2017-05-13 DIAGNOSIS — M1990 Unspecified osteoarthritis, unspecified site: Secondary | ICD-10-CM | POA: Insufficient documentation

## 2017-05-17 DIAGNOSIS — Z7901 Long term (current) use of anticoagulants: Secondary | ICD-10-CM | POA: Diagnosis not present

## 2017-05-17 DIAGNOSIS — W108XXA Fall (on) (from) other stairs and steps, initial encounter: Secondary | ICD-10-CM | POA: Diagnosis not present

## 2017-05-17 DIAGNOSIS — Z7409 Other reduced mobility: Secondary | ICD-10-CM | POA: Diagnosis not present

## 2017-05-17 DIAGNOSIS — M25552 Pain in left hip: Secondary | ICD-10-CM | POA: Diagnosis not present

## 2017-05-17 DIAGNOSIS — S5002XA Contusion of left elbow, initial encounter: Secondary | ICD-10-CM | POA: Diagnosis not present

## 2017-05-17 DIAGNOSIS — M25522 Pain in left elbow: Secondary | ICD-10-CM | POA: Diagnosis not present

## 2017-05-17 DIAGNOSIS — I639 Cerebral infarction, unspecified: Secondary | ICD-10-CM | POA: Diagnosis not present

## 2017-05-19 DIAGNOSIS — R261 Paralytic gait: Secondary | ICD-10-CM | POA: Diagnosis not present

## 2017-05-19 DIAGNOSIS — C250 Malignant neoplasm of head of pancreas: Secondary | ICD-10-CM | POA: Diagnosis not present

## 2017-05-19 DIAGNOSIS — Z950 Presence of cardiac pacemaker: Secondary | ICD-10-CM | POA: Diagnosis not present

## 2017-05-19 DIAGNOSIS — I10 Essential (primary) hypertension: Secondary | ICD-10-CM | POA: Diagnosis not present

## 2017-05-19 DIAGNOSIS — M6281 Muscle weakness (generalized): Secondary | ICD-10-CM | POA: Diagnosis not present

## 2017-05-20 DIAGNOSIS — Z7901 Long term (current) use of anticoagulants: Secondary | ICD-10-CM | POA: Diagnosis not present

## 2017-05-20 DIAGNOSIS — Z8673 Personal history of transient ischemic attack (TIA), and cerebral infarction without residual deficits: Secondary | ICD-10-CM | POA: Diagnosis not present

## 2017-05-20 DIAGNOSIS — M25522 Pain in left elbow: Secondary | ICD-10-CM | POA: Diagnosis not present

## 2017-05-20 DIAGNOSIS — R261 Paralytic gait: Secondary | ICD-10-CM | POA: Diagnosis not present

## 2017-05-20 DIAGNOSIS — M6281 Muscle weakness (generalized): Secondary | ICD-10-CM | POA: Diagnosis not present

## 2017-05-20 DIAGNOSIS — S50812D Abrasion of left forearm, subsequent encounter: Secondary | ICD-10-CM | POA: Diagnosis not present

## 2017-05-20 DIAGNOSIS — M25552 Pain in left hip: Secondary | ICD-10-CM | POA: Diagnosis not present

## 2017-05-20 DIAGNOSIS — M25532 Pain in left wrist: Secondary | ICD-10-CM | POA: Diagnosis not present

## 2017-05-20 DIAGNOSIS — Z9181 History of falling: Secondary | ICD-10-CM | POA: Diagnosis not present

## 2017-05-20 DIAGNOSIS — I10 Essential (primary) hypertension: Secondary | ICD-10-CM | POA: Diagnosis not present

## 2017-05-20 DIAGNOSIS — I2510 Atherosclerotic heart disease of native coronary artery without angina pectoris: Secondary | ICD-10-CM | POA: Diagnosis not present

## 2017-05-23 ENCOUNTER — Encounter: Payer: Self-pay | Admitting: Cardiology

## 2017-05-23 DIAGNOSIS — M6281 Muscle weakness (generalized): Secondary | ICD-10-CM | POA: Diagnosis not present

## 2017-05-23 DIAGNOSIS — I10 Essential (primary) hypertension: Secondary | ICD-10-CM | POA: Diagnosis not present

## 2017-05-23 DIAGNOSIS — R261 Paralytic gait: Secondary | ICD-10-CM | POA: Diagnosis not present

## 2017-05-23 DIAGNOSIS — M25552 Pain in left hip: Secondary | ICD-10-CM | POA: Diagnosis not present

## 2017-05-23 DIAGNOSIS — S50812D Abrasion of left forearm, subsequent encounter: Secondary | ICD-10-CM | POA: Diagnosis not present

## 2017-05-23 DIAGNOSIS — I2510 Atherosclerotic heart disease of native coronary artery without angina pectoris: Secondary | ICD-10-CM | POA: Diagnosis not present

## 2017-05-24 DIAGNOSIS — W19XXXD Unspecified fall, subsequent encounter: Secondary | ICD-10-CM | POA: Diagnosis not present

## 2017-05-24 DIAGNOSIS — S5002XD Contusion of left elbow, subsequent encounter: Secondary | ICD-10-CM | POA: Diagnosis not present

## 2017-05-25 DIAGNOSIS — S50812D Abrasion of left forearm, subsequent encounter: Secondary | ICD-10-CM | POA: Diagnosis not present

## 2017-05-25 DIAGNOSIS — M6281 Muscle weakness (generalized): Secondary | ICD-10-CM | POA: Diagnosis not present

## 2017-05-25 DIAGNOSIS — M25552 Pain in left hip: Secondary | ICD-10-CM | POA: Diagnosis not present

## 2017-05-25 DIAGNOSIS — I10 Essential (primary) hypertension: Secondary | ICD-10-CM | POA: Diagnosis not present

## 2017-05-25 DIAGNOSIS — R261 Paralytic gait: Secondary | ICD-10-CM | POA: Diagnosis not present

## 2017-05-25 DIAGNOSIS — I2510 Atherosclerotic heart disease of native coronary artery without angina pectoris: Secondary | ICD-10-CM | POA: Diagnosis not present

## 2017-05-26 DIAGNOSIS — I2510 Atherosclerotic heart disease of native coronary artery without angina pectoris: Secondary | ICD-10-CM | POA: Diagnosis not present

## 2017-05-26 DIAGNOSIS — I10 Essential (primary) hypertension: Secondary | ICD-10-CM | POA: Diagnosis not present

## 2017-05-26 DIAGNOSIS — M25552 Pain in left hip: Secondary | ICD-10-CM | POA: Diagnosis not present

## 2017-05-26 DIAGNOSIS — S50812D Abrasion of left forearm, subsequent encounter: Secondary | ICD-10-CM | POA: Diagnosis not present

## 2017-05-26 DIAGNOSIS — R261 Paralytic gait: Secondary | ICD-10-CM | POA: Diagnosis not present

## 2017-05-26 DIAGNOSIS — M6281 Muscle weakness (generalized): Secondary | ICD-10-CM | POA: Diagnosis not present

## 2017-05-30 DIAGNOSIS — M6281 Muscle weakness (generalized): Secondary | ICD-10-CM | POA: Diagnosis not present

## 2017-05-30 DIAGNOSIS — M25552 Pain in left hip: Secondary | ICD-10-CM | POA: Diagnosis not present

## 2017-05-30 DIAGNOSIS — I10 Essential (primary) hypertension: Secondary | ICD-10-CM | POA: Diagnosis not present

## 2017-05-30 DIAGNOSIS — I2510 Atherosclerotic heart disease of native coronary artery without angina pectoris: Secondary | ICD-10-CM | POA: Diagnosis not present

## 2017-05-30 DIAGNOSIS — S50812D Abrasion of left forearm, subsequent encounter: Secondary | ICD-10-CM | POA: Diagnosis not present

## 2017-05-30 DIAGNOSIS — R261 Paralytic gait: Secondary | ICD-10-CM | POA: Diagnosis not present

## 2017-05-31 ENCOUNTER — Encounter: Payer: Self-pay | Admitting: Cardiology

## 2017-05-31 ENCOUNTER — Ambulatory Visit (INDEPENDENT_AMBULATORY_CARE_PROVIDER_SITE_OTHER): Payer: Medicare Other | Admitting: Cardiology

## 2017-05-31 VITALS — BP 128/62 | HR 65 | Ht 62.0 in | Wt 205.4 lb

## 2017-05-31 DIAGNOSIS — R2681 Unsteadiness on feet: Secondary | ICD-10-CM | POA: Diagnosis not present

## 2017-05-31 DIAGNOSIS — Z7901 Long term (current) use of anticoagulants: Secondary | ICD-10-CM | POA: Diagnosis not present

## 2017-05-31 DIAGNOSIS — R261 Paralytic gait: Secondary | ICD-10-CM | POA: Diagnosis not present

## 2017-05-31 DIAGNOSIS — I10 Essential (primary) hypertension: Secondary | ICD-10-CM | POA: Diagnosis not present

## 2017-05-31 DIAGNOSIS — M6281 Muscle weakness (generalized): Secondary | ICD-10-CM | POA: Diagnosis not present

## 2017-05-31 DIAGNOSIS — M25552 Pain in left hip: Secondary | ICD-10-CM | POA: Diagnosis not present

## 2017-05-31 DIAGNOSIS — S50812D Abrasion of left forearm, subsequent encounter: Secondary | ICD-10-CM | POA: Diagnosis not present

## 2017-05-31 DIAGNOSIS — Z8673 Personal history of transient ischemic attack (TIA), and cerebral infarction without residual deficits: Secondary | ICD-10-CM | POA: Insufficient documentation

## 2017-05-31 DIAGNOSIS — R296 Repeated falls: Secondary | ICD-10-CM | POA: Diagnosis not present

## 2017-05-31 DIAGNOSIS — I2510 Atherosclerotic heart disease of native coronary artery without angina pectoris: Secondary | ICD-10-CM | POA: Diagnosis not present

## 2017-05-31 DIAGNOSIS — I350 Nonrheumatic aortic (valve) stenosis: Secondary | ICD-10-CM | POA: Diagnosis not present

## 2017-05-31 MED ORDER — ASPIRIN EC 325 MG PO TBEC: 325.0000 mg | DELAYED_RELEASE_TABLET | Freq: Every day | ORAL | 5 refills | Status: DC

## 2017-05-31 NOTE — Patient Instructions (Signed)
Medication Instructions:  Your physician has recommended you make the following change in your medication:  Please call the office today or tomorrow regarding stopping coumadin once this is discussed with your PCP.  Labwork: none  Testing/Procedures: none  Follow-Up: Your physician recommends that you schedule a follow-up appointment in: 6 months  Any Other Special Instructions Will Be Listed Below (If Applicable).     If you need a refill on your cardiac medications before your next appointment, please call your pharmacy.   [place], RN, BSN

## 2017-05-31 NOTE — Progress Notes (Signed)
Cardiology Office Note:    Date:  05/31/2017   ID:  Julie Mcgee, DOB Dec 24, 1933, MRN [id number]  PCP:  Glenford Bayley, DO  Cardiologist:  Jenean Lindau, MD   Referring MD: Glenford Bayley, DO    ASSESSMENT:    1. Essential hypertension   2. Moderate to severe aortic stenosis   3. Current use of long term anticoagulation   4. History of stroke   5. Unstable gait   6. Recurrent falls    PLAN:    In order of problems listed above:  1. Secondary prevention stressed with the patient.  Importance of compliance with diet and medications stressed and she vocalized understanding.  Her blood pressure stable.  Diet was discussed for obesity.  Risks of obesity explained and she vocalized understanding. 2. Patient is on anticoagulation for history of stroke several years ago.  There is no history of atrial fibrillation in the record.  The patient does not provide any history of atrial fibrillation.  She is very unstable on her gait.  She is morbidly obese.  She uses a walker.  She has had 2 significant falls in the past 2 weeks and a significant hematoma in her elbow.  I told her that this would be a very high risk for anticoagulation use and recommended stopping anticoagulation.  I told her to substitute this with 325 mg of coated aspirin on a daily basis.  She mentions to me that she would like to call her primary care physician and her doctors in [place] about this before she makes a decision.  I respect her wishes.  I told her that they would be more than welcome to call me to discuss this issue.  I also told her that they could request for a copy of these records and my evaluation to understand my thought process on this issue. 3. Patient has moderate to severe aortic stenosis.  Aortic valve assessment by echocardiography and the report was reviewed by me.  Patient is aware of aortic valve intervention such as TAVR as her husband has had this intervention.  She is not keen on any intervention  at this time.  I respect her wishes she denies any history of chest pain or syncope.  She has no shortness of breath on exertion that is worse than what she has had for the past several years. 4. Patient will be seen in follow-up appointment in 6 months or earlier if the patient has any concerns    Medication Adjustments/Labs and Tests Ordered: Current medicines are reviewed at length with the patient today.  Concerns regarding medicines are outlined above.  No orders of the defined types were placed in this encounter.  No orders of the defined types were placed in this encounter.    History of Present Illness:    Julie Mcgee is a 84 y.o. female who is being seen today for getting established with me  At the request of [name], [name], DO.  The patient has history of moderate to severe aortic stenosis and history of stroke.  She is here to get established.  She denies history of hypertension or diabetes mellitus.  She is morbidly obese.  Her son brings her along.  She has moved from [place] to this area.  She is using a walker.  She is very unstable on her gait.  She has had 2 significant falls in the past 2 weeks.  She has a significant hematoma in  her left elbow managed by her primary care provider.  She has had a history of stroke.  There is no documented history of any arrhythmia such as atrial fibrillation in the documents that are available to me.  She denies any history of atrial fibrillation.  She mentions to me that her anticoagulation is only for stroke and she is been on it for the past several years.  Past Medical History:  Diagnosis Date  . Anticoagulant long-term use   . Arthritis   . Carotid artery stenosis   . CVA (cerebral vascular accident) ([place])   . Former smoker    1/2 ppd for 45 years  . Hyperlipidemia   . Hypertension   . Hyponatremia   . Hypothyroidism   . Peripheral vascular disease [facility]     Past Surgical History:  Procedure Laterality Date  . BASAL  CELL CARCINOMA EXCISION  2018  . COLONOSCOPY  2001  . HIP SURGERY  2005  . KNEE SURGERY  1998    Current Medications: Current Meds  Medication Sig  . calcium carbonate (OSCAL) 1500 (600 Ca) MG TABS tablet Take 1 tablet by mouth 2 (two) times daily with a meal.  . hydrochlorothiazide (HYDRODIURIL) 12.5 MG tablet Take 12.5 mg by mouth daily.  [name] levothyroxine (SYNTHROID, LEVOTHROID) 100 MCG tablet Take 100 mcg by mouth daily before breakfast.  . metoprolol succinate (TOPROL-XL) 50 MG 24 hr tablet Take 50 mg by mouth 2 (two) times daily. Take with or immediately following a meal.  . metoprolol tartrate (LOPRESSOR) 50 MG tablet Take 1 tablet by mouth 2 (two) times daily.  . Multiple Vitamins-Minerals (MULTIVITAMIN ADULTS 50+) TABS Take 1 tablet by mouth daily.  . simvastatin (ZOCOR) 80 MG tablet Take 80 mg by mouth every evening.  . warfarin (COUMADIN) 2 MG tablet Take 2 mg by mouth daily.     Allergies:   Patient has no known allergies.   Social History   Socioeconomic History  . Marital status: Married    Spouse name: Not on file  . Number of children: Not on file  . Years of education: Not on file  . Highest education level: Not on file  Occupational History  . Not on file  Social Needs  . Financial resource strain: Not on file  . Food insecurity:    Worry: Not on file    Inability: Not on file  . Transportation needs:    Medical: Not on file    Non-medical: Not on file  Tobacco Use  . Smoking status: Former Smoker    Packs/day: 0.50    Years: 45.00    Pack years: 22.50    Types: Cigarettes    Last attempt to quit: 05/23/2009    Years since quitting: 8.0  . Smokeless tobacco: Never Used  Substance and Sexual Activity  . Alcohol use: Not Currently  . Drug use: Not on file  . Sexual activity: Not on file  Lifestyle  . Physical activity:    Days per week: Not on file    Minutes per session: Not on file  . Stress: Not on file  Relationships  . Social connections:     Talks on phone: Not on file    Gets together: Not on file    Attends religious service: Not on file    Active member of club or organization: Not on file    Attends meetings of clubs or organizations: Not on file    Relationship status: Not on file  Other Topics Concern  . Not on file  Social History Narrative  . Not on file     Family History: The patient's family history is not on file.  ROS:   Please see the history of present illness.    All other systems reviewed and are negative.  EKGs/Labs/Other Studies Reviewed:    The following studies were reviewed today: EKG reveals sinus rhythm and nonspecific ST-T changes.   Recent Labs: No results found for requested labs within last 8760 hours.  Recent Lipid Panel No results found for: CHOL, TRIG, HDL, CHOLHDL, VLDL, LDLCALC, LDLDIRECT  Physical Exam:    VS:  BP 128/62 (BP Location: Right Arm, Patient Position: Sitting, Cuff Size: Normal)   Pulse 65   Ht 5\' 2"  (1.575 m)   Wt 205 lb 6.4 oz (93.2 kg)   SpO2 97%   BMI 37.57 kg/m     Wt Readings from Last 3 Encounters:  05/31/17 205 lb 6.4 oz (93.2 kg)     GEN: Patient is in no acute distress HEENT: Normal NECK: No JVD; No carotid bruits LYMPHATICS: No lymphadenopathy CARDIAC: S1 S2 regular, 2/6 systolic murmur at the apex. RESPIRATORY:  Clear to auscultation without rales, wheezing or rhonchi  ABDOMEN: Soft, non-tender, non-distended MUSCULOSKELETAL:  No edema; No deformity  SKIN: Warm and dry NEUROLOGIC:  Alert and oriented x 3 PSYCHIATRIC:  Normal affect    Signed, Jenean Lindau, MD  05/31/2017 11:01 AM    [place]

## 2017-06-01 ENCOUNTER — Telehealth: Payer: Self-pay | Admitting: Cardiology

## 2017-06-01 DIAGNOSIS — M6281 Muscle weakness (generalized): Secondary | ICD-10-CM | POA: Diagnosis not present

## 2017-06-01 DIAGNOSIS — M25552 Pain in left hip: Secondary | ICD-10-CM | POA: Diagnosis not present

## 2017-06-01 DIAGNOSIS — I10 Essential (primary) hypertension: Secondary | ICD-10-CM | POA: Diagnosis not present

## 2017-06-01 DIAGNOSIS — S50812D Abrasion of left forearm, subsequent encounter: Secondary | ICD-10-CM | POA: Diagnosis not present

## 2017-06-01 DIAGNOSIS — I2510 Atherosclerotic heart disease of native coronary artery without angina pectoris: Secondary | ICD-10-CM | POA: Diagnosis not present

## 2017-06-01 DIAGNOSIS — R261 Paralytic gait: Secondary | ICD-10-CM | POA: Diagnosis not present

## 2017-06-01 NOTE — Telephone Encounter (Signed)
Please call patient's son.[name]

## 2017-06-02 ENCOUNTER — Other Ambulatory Visit: Payer: Self-pay

## 2017-06-02 NOTE — Telephone Encounter (Signed)
Spoke with patient's son yesterday regarding stopping warfarin per Dr. Julien Nordmann recommendation. The son stated that the patient's doctor's were not convinced that she needed to discontinue the medication. Per the patient's wishes she does not want to discontinue warfarin at this time. Dr. Geraldo Pitter spoke with Dr. Marin Comment regarding warfarin, per Dr. Marin Comment she would review the patient's case and decide. Informed patient's son to not initiate the 325 mg ASA if staying on warfarin.

## 2017-06-03 DIAGNOSIS — I2510 Atherosclerotic heart disease of native coronary artery without angina pectoris: Secondary | ICD-10-CM | POA: Diagnosis not present

## 2017-06-03 DIAGNOSIS — M6281 Muscle weakness (generalized): Secondary | ICD-10-CM | POA: Diagnosis not present

## 2017-06-03 DIAGNOSIS — M25552 Pain in left hip: Secondary | ICD-10-CM | POA: Diagnosis not present

## 2017-06-03 DIAGNOSIS — I10 Essential (primary) hypertension: Secondary | ICD-10-CM | POA: Diagnosis not present

## 2017-06-03 DIAGNOSIS — S50812D Abrasion of left forearm, subsequent encounter: Secondary | ICD-10-CM | POA: Diagnosis not present

## 2017-06-03 DIAGNOSIS — R261 Paralytic gait: Secondary | ICD-10-CM | POA: Diagnosis not present

## 2017-06-07 ENCOUNTER — Encounter: Payer: Self-pay | Admitting: Neurology

## 2017-06-07 ENCOUNTER — Ambulatory Visit (INDEPENDENT_AMBULATORY_CARE_PROVIDER_SITE_OTHER): Payer: Medicare Other | Admitting: Neurology

## 2017-06-07 VITALS — BP 156/70 | HR 62 | Ht 62.0 in | Wt 203.0 lb

## 2017-06-07 DIAGNOSIS — I2510 Atherosclerotic heart disease of native coronary artery without angina pectoris: Secondary | ICD-10-CM | POA: Diagnosis not present

## 2017-06-07 DIAGNOSIS — Z7901 Long term (current) use of anticoagulants: Secondary | ICD-10-CM | POA: Diagnosis not present

## 2017-06-07 DIAGNOSIS — Z8673 Personal history of transient ischemic attack (TIA), and cerebral infarction without residual deficits: Secondary | ICD-10-CM | POA: Diagnosis not present

## 2017-06-07 DIAGNOSIS — R2681 Unsteadiness on feet: Secondary | ICD-10-CM

## 2017-06-07 DIAGNOSIS — R261 Paralytic gait: Secondary | ICD-10-CM | POA: Diagnosis not present

## 2017-06-07 DIAGNOSIS — I350 Nonrheumatic aortic (valve) stenosis: Secondary | ICD-10-CM

## 2017-06-07 DIAGNOSIS — M6281 Muscle weakness (generalized): Secondary | ICD-10-CM | POA: Diagnosis not present

## 2017-06-07 DIAGNOSIS — S50812D Abrasion of left forearm, subsequent encounter: Secondary | ICD-10-CM | POA: Diagnosis not present

## 2017-06-07 DIAGNOSIS — I10 Essential (primary) hypertension: Secondary | ICD-10-CM

## 2017-06-07 DIAGNOSIS — M25552 Pain in left hip: Secondary | ICD-10-CM | POA: Diagnosis not present

## 2017-06-07 NOTE — Patient Instructions (Addendum)
-   continue coumadin and simvastatin for now. - will get the medical record in [facility] for the etiology of your stroke in 01/2009.  - will call you after reviewing the record - continue PT/OT for gait and balance training - be cautious on walking and avoid fall - Follow up with your primary care physician for stroke risk factor modification. Recommend maintain blood pressure goal around130/80, diabetes with hemoglobin A1c goal below 7.0% and lipids with LDL cholesterol goal below 70 mg/dL.  - check BP at home and record - follow up in 2 months with Janett Billow

## 2017-06-07 NOTE — Progress Notes (Signed)
NEUROLOGY CLINIC NEW PATIENT NOTE  NAME: Julie Mcgee DOB: 1933-12-04 REFERRING PHYSICIAN: Glenford Bayley, DO  I saw Julie Mcgee as a new consult in the neurovascular clinic today regarding  Chief Complaint  Patient presents with  . New Patient (Initial Visit)    h/o stroke. Patient has had 2 falls in the last few weeks, difficulty walking.   [name]  HPI: Julie Mcgee is a 84 y.o. female with PMH of HTN, stroke in 2011 and aortic stenosis who presents as a new patient for hx of stroke on [facility].   Pt accompanied by son and husband. She stated that she had stroke on 02/09/09, she was on computer and had acute onset weakness and not able to speak. Husband saw her at that time and stated that she became unconscious, drooling and right sided weakness. 911 called and she was admitted to neuro ICU in [facility] in [place] for 3-4 days. Not intubated as per son. She then had rehab for 3 months. Her neuro condition improved over the years, only had mild weakness of right hand and balance difficulty, walking with walker. However, she and family did not know what the etiology for the stroke but was put on coumadin since the stroke.  She just moved from [place] in 03/2017 and already had two falls about 3 and 4 weeks ago, respectively. However, both were mechanical falls, including one in the parking lot due to uneven ground, and one stumbled on the curb and falling backward. She had left elbow hematoma and has been following with PCP. Her last INR 2.6. She denies any black out, CP, weakness or numbness, seizure associated with the falls. Currently has PT/OT at home for balance training.  She saw Dr. Geraldo Pitter on 05/31/17 in cardiology for AS, and did not find any indication for coumadin use based on available records. Given the falls, he recommended switch coumadin to ASA 325mg . However, pt is very worried about recurrent stroke if off coumadin, she was referred here for further opinion.    She has hx of HTN, on HCTZ and metoprolol. Her BP today 156/70. She did not check BP at home. She had AS and following with Dr. Marijean Bravo at [place] before moved to [place] 2 months ago.   Past Medical History:  Diagnosis Date  . Anticoagulant long-term use   . Arthritis   . Carotid artery stenosis   . CVA (cerebral vascular accident) ([place])   . Former smoker    1/2 ppd for 45 years  . Hyperlipidemia   . Hypertension   . Hyponatremia   . Hypothyroidism   . Peripheral vascular disease [facility])    Past Surgical History:  Procedure Laterality Date  . BASAL CELL CARCINOMA EXCISION  2018  . COLONOSCOPY  2001  . HIP SURGERY  2005  . KNEE SURGERY  1998   No family history on file. Current Outpatient Medications  Medication Sig Dispense Refill  . calcium carbonate (OSCAL) 1500 (600 Ca) MG TABS tablet Take 1 tablet by mouth 2 (two) times daily with a meal.    . hydrochlorothiazide (HYDRODIURIL) 12.5 MG tablet Take 12.5 mg by mouth daily.    [name] levothyroxine (SYNTHROID, LEVOTHROID) 100 MCG tablet Take 100 mcg by mouth daily before breakfast.    . metoprolol succinate (TOPROL-XL) 50 MG 24 hr tablet Take 50 mg by mouth 2 (two) times daily. Take with or immediately following a meal.    . metoprolol tartrate (LOPRESSOR) 50 MG  tablet Take 1 tablet by mouth 2 (two) times daily.  3  . Multiple Vitamins-Minerals (MULTIVITAMIN ADULTS 50+) TABS Take 1 tablet by mouth daily.    . simvastatin (ZOCOR) 80 MG tablet Take 80 mg by mouth every evening.    . warfarin (COUMADIN) 2 MG tablet Take 2 mg by mouth daily.     No current facility-administered medications for this visit.    No Known Allergies Social History   Socioeconomic History  . Marital status: Married    Spouse name: Not on file  . Number of children: Not on file  . Years of education: Not on file  . Highest education level: Not on file  Occupational History  . Not on file  Social Needs  . Financial resource strain: Not on file  .  Food insecurity:    Worry: Not on file    Inability: Not on file  . Transportation needs:    Medical: Not on file    Non-medical: Not on file  Tobacco Use  . Smoking status: Former Smoker    Packs/day: 0.50    Years: 45.00    Pack years: 22.50    Types: Cigarettes    Last attempt to quit: 05/23/2009    Years since quitting: 8.0  . Smokeless tobacco: Never Used  Substance and Sexual Activity  . Alcohol use: Not Currently  . Drug use: Not on file  . Sexual activity: Not on file  Lifestyle  . Physical activity:    Days per week: Not on file    Minutes per session: Not on file  . Stress: Not on file  Relationships  . Social connections:    Talks on phone: Not on file    Gets together: Not on file    Attends religious service: Not on file    Active member of club or organization: Not on file    Attends meetings of clubs or organizations: Not on file    Relationship status: Not on file  . Intimate partner violence:    Fear of current or ex partner: Not on file    Emotionally abused: Not on file    Physically abused: Not on file    Forced sexual activity: Not on file  Other Topics Concern  . Not on file  Social History Narrative  . Not on file    Review of Systems Full 14 system review of systems performed and notable only for those listed, all others are neg:  Constitutional:   Cardiovascular:  Ear/Nose/Throat:   Skin:  Eyes:   Respiratory:  SOB Gastroitestinal:  incontinence Genitourinary:  Hematology/Lymphatic:  Easy bruising, easy bleeding Endocrine:  Musculoskeletal:   Allergy/Immunology:   Neurological:  Slurry speech Psychiatric:  Sleep:    Physical Exam  Vitals:   06/07/17 1112  BP: (!) 156/70  Pulse: 62    General - Well nourished, well developed, in no apparent distress.  Ophthalmologic - Sharp disc margins OU.  Cardiovascular - Regular rate and rhythm with no murmur.   Neck - supple, no nuchal rigidity .  Mental Status -  Level of  arousal and orientation to time, place, and person were intact. Language including expression, naming, repetition, comprehension, reading, and writing was assessed and found intact. Fund of Knowledge was assessed and was intact.  Cranial Nerves II - XII - II - Visual field intact OU. III, IV, VI - Extraocular movements intact. V - Facial sensation intact bilaterally. VII - Facial movement intact bilaterally. VIII -  Hearing & vestibular intact bilaterally. X - Palate elevates symmetrically, mild dysarthria. XI - Chin turning & shoulder shrug intact bilaterally. XII - Tongue protrusion intact.  Motor Strength - The patient's strength was normal in all extremities except right hand mild dexterity difficulty and pronator drift was absent.  Bulk was normal and fasciculations were absent.   Motor Tone - Muscle tone was assessed at the neck and appendages and was normal.  Reflexes - The patient's reflexes were normal in all extremities and she had no pathological reflexes.  Sensory - Light touch, temperature/pinprick were assessed and were normal.    Coordination - The patient had normal movements in the hands with no ataxia or dysmetria.  Tremor was absent.  Gait and Station - walking with walker, steady, very subtle right hemiparetic gait.   Imaging none  Lab Review none    Assessment and Plan:   In summary, Sidonie Dexheimer is a 84 y.o. female with PMH of HTN, stroke in 2011 and aortic stenosis who presents as a new patient for hx of stroke on [facility]. Pt had stroke on 02/09/09 with aphasia and right sided weakness and treated in [facility] in [place]. Not sure about the etiology for the stroke but she was on coumadin since the stroke. She just moved to [facility] in 03/2017 and already had two mechanical falls about 3 and 4 weeks ago, respectively. She saw cardiology for AS, and did not find any indication for coumadin use based on available records. Given the falls, he  recommended switch coumadin to ASA 325mg . However, pt is very worried about recurrent stroke if off coumadin. We need more info about her stroke before making medication changes. Pt has PT/OT for balance training.  - continue coumadin and simvastatin for now. - will get the medical record in [facility] for the etiology of the stroke in 01/2009.  - continue PT/OT for gait and balance training - be cautious on walking and avoid fall - Follow up with your primary care physician for stroke risk factor modification. Recommend maintain blood pressure goal around130/80, diabetes with hemoglobin A1c goal below 7.0% and lipids with LDL cholesterol goal below 70 mg/dL.  - check BP at home and record - follow up in 2 months with Janett Billow  Thank you very much for the opportunity to participate in the care of this patient.  Please do not hesitate to call if any questions or concerns arise.  No orders of the defined types were placed in this encounter.   No orders of the defined types were placed in this encounter.   Patient Instructions  - continue coumadin and simvastatin for now. - will get the medical record in [facility] for the etiology of your stroke in 01/2009.  - will call you after reviewing the record - continue PT/OT for gait and balance training - be cautious on walking and avoid fall - Follow up with your primary care physician for stroke risk factor modification. Recommend maintain blood pressure goal around130/80, diabetes with hemoglobin A1c goal below 7.0% and lipids with LDL cholesterol goal below 70 mg/dL.  - check BP at home and record - follow up in 2 months with Ace Gins, MD PhD [facility] [address] [place], [place] [address] [phone]

## 2017-06-08 ENCOUNTER — Telehealth: Payer: Self-pay

## 2017-06-08 DIAGNOSIS — R261 Paralytic gait: Secondary | ICD-10-CM | POA: Diagnosis not present

## 2017-06-08 DIAGNOSIS — M6281 Muscle weakness (generalized): Secondary | ICD-10-CM | POA: Diagnosis not present

## 2017-06-08 DIAGNOSIS — I10 Essential (primary) hypertension: Secondary | ICD-10-CM | POA: Diagnosis not present

## 2017-06-08 DIAGNOSIS — M25552 Pain in left hip: Secondary | ICD-10-CM | POA: Diagnosis not present

## 2017-06-08 DIAGNOSIS — S50812D Abrasion of left forearm, subsequent encounter: Secondary | ICD-10-CM | POA: Diagnosis not present

## 2017-06-08 DIAGNOSIS — I2510 Atherosclerotic heart disease of native coronary artery without angina pectoris: Secondary | ICD-10-CM | POA: Diagnosis not present

## 2017-06-08 NOTE — Telephone Encounter (Signed)
RN call patients son [name] on dpr. Rn stated the correct name of the hospital in [place] needs to be verify to get his mom hospital notes from 2011 to 2012. The son stated its  [facility].

## 2017-06-08 NOTE — Telephone Encounter (Signed)
Medical records from hospital in [place] in [place] receive. Given to Dr. Erlinda Hong.

## 2017-06-08 NOTE — Telephone Encounter (Signed)
Rn call [facility] at [phone] to get medical records. They did verify pt was hospitalized 201, 2012,and 2017. Rn stated per MD only 2011 to 2012 was needed per stroke MD. Fax number was given of [phone]. Release from fax for stat request at [phone]. Form fax twice and confirmed.

## 2017-06-09 ENCOUNTER — Telehealth: Payer: Self-pay | Admitting: Neurology

## 2017-06-09 NOTE — Telephone Encounter (Addendum)
Received medical record from [facility] between 02/09/2009 to 02/18/2009 to find out the reason she was on coumadin.   Summary of the record: Pt presented on 02/09/2009 for left sided weakness, left facial droop, dysarthria.  NIH score 7 at that time.  She was admitted for neuro ICU for 5 days.  Stroke work-up showed acute bilateral cerebellar, right occipital lobe and right thalamic infarcts.  Embolic pattern.  CTA head showed no evidence of aneurysm or significant stenosis or AVM.  CT of the neck severe stenosis of the right vertebral artery origin along with moderate stenosis of left ICA proximal.  There was also mild stenosis at right ICA proximal.  2D echo showed EF 60 to 65% with left atrium moderately dilated.  However, there was a moderate sized vegetation or mass in the mitral wall.  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  Differential include thrombus and fibroblastoma.  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  Patient was started on heparin drip and then transition to Coumadin on discharge in anticipation of outpatient follow-up with cardiovascular surgery.  INR 2.61 on discharge.  During hospitalization, neurosurgery was also consulted for cerebral edema and fourth ventricle encasement, however, no surgery was EVD needed.  She was started on 3% saline.  However, developed pulmonary edema which gradually resolved after stopping 3% saline.  Repeat CT showed no midline shift or hydrocephalus.  EEG shows diffuse slow with no seizure like activity.  Patient also developed alcohol withdrawal with DT, restless, agitated.  On CIWA protocol, vitamin B1, folic acid and multivitamin.  Withdrawal symptoms improved.  She was also put on nicotine patch  for smoking cessation. Discharged in stable condition.   Telephone call: I called pt today and let her know the medical record I reviewed and I asked her whether she saw the cardiovascular surgeon after discharge in 2011.  She said she cannot remember the details but seems that she saw the cardiologist surgeon and was given option for surgery but she declined surgery at that time and prefer to continue medical therapy with Coumadin.  Therefore, her Coumadin was continued until now.  I told her that the Coumadin use probably appropriate for her condition at that time.  However, it is also an option now to repeat TEE to evaluate the current status of mitral valve mass. This will also help [place] decide her anticoagulation use. She said she will talk to her husband and son and will let us know. I told her that I would keep her on coumadin in the meantime. She expressed understanding and appreciation.   Rosalin Hawking, MD PhD Stroke Neurology 06/09/2017 9:49 AM

## 2017-06-09 NOTE — Telephone Encounter (Signed)
Rn call the son about the telephone call his wife had with Dr.Xu. Rn the MD calls the pt and the main number for any concerns that was listed.Rn stated the MD does not call anyone on the dpr unless they cannot get in contact with the pt. Rn also stated there were no instructions to call him for everything. RN stated there can be a note and the main number can be change to him. The son stated he would like for all calls to be sent to him concerning labs, meds or etc. Rn made a note in demographics,and change to the sons phone number. .Rn read the son Dr. Erlinda Hong note below about pt staying on coumadin,and considering her having a TEE. Rn stated his mom wanted to discuss it with son and husband. The son verbalized understanding,and will speak with his mom. He will call back when they make a decision about the TEE.

## 2017-06-09 NOTE — Telephone Encounter (Signed)
Pt son(on DPR) has called stating Dr Erlinda Hong relayed information to pt and he would like to be made aware of what all was relayed to pt.  Pt son is asking for a call

## 2017-06-10 DIAGNOSIS — S50812D Abrasion of left forearm, subsequent encounter: Secondary | ICD-10-CM | POA: Diagnosis not present

## 2017-06-10 DIAGNOSIS — M25552 Pain in left hip: Secondary | ICD-10-CM | POA: Diagnosis not present

## 2017-06-10 DIAGNOSIS — M6281 Muscle weakness (generalized): Secondary | ICD-10-CM | POA: Diagnosis not present

## 2017-06-10 DIAGNOSIS — S5002XD Contusion of left elbow, subsequent encounter: Secondary | ICD-10-CM | POA: Diagnosis not present

## 2017-06-10 DIAGNOSIS — I10 Essential (primary) hypertension: Secondary | ICD-10-CM | POA: Diagnosis not present

## 2017-06-10 DIAGNOSIS — I2510 Atherosclerotic heart disease of native coronary artery without angina pectoris: Secondary | ICD-10-CM | POA: Diagnosis not present

## 2017-06-10 DIAGNOSIS — R261 Paralytic gait: Secondary | ICD-10-CM | POA: Diagnosis not present

## 2017-06-13 DIAGNOSIS — M25552 Pain in left hip: Secondary | ICD-10-CM | POA: Diagnosis not present

## 2017-06-13 DIAGNOSIS — I2510 Atherosclerotic heart disease of native coronary artery without angina pectoris: Secondary | ICD-10-CM | POA: Diagnosis not present

## 2017-06-13 DIAGNOSIS — M6281 Muscle weakness (generalized): Secondary | ICD-10-CM | POA: Diagnosis not present

## 2017-06-13 DIAGNOSIS — S50812D Abrasion of left forearm, subsequent encounter: Secondary | ICD-10-CM | POA: Diagnosis not present

## 2017-06-13 DIAGNOSIS — I10 Essential (primary) hypertension: Secondary | ICD-10-CM | POA: Diagnosis not present

## 2017-06-13 DIAGNOSIS — R261 Paralytic gait: Secondary | ICD-10-CM | POA: Diagnosis not present

## 2017-06-16 DIAGNOSIS — S50812D Abrasion of left forearm, subsequent encounter: Secondary | ICD-10-CM | POA: Diagnosis not present

## 2017-06-16 DIAGNOSIS — I2510 Atherosclerotic heart disease of native coronary artery without angina pectoris: Secondary | ICD-10-CM | POA: Diagnosis not present

## 2017-06-16 DIAGNOSIS — I10 Essential (primary) hypertension: Secondary | ICD-10-CM | POA: Diagnosis not present

## 2017-06-16 DIAGNOSIS — M25552 Pain in left hip: Secondary | ICD-10-CM | POA: Diagnosis not present

## 2017-06-16 DIAGNOSIS — R261 Paralytic gait: Secondary | ICD-10-CM | POA: Diagnosis not present

## 2017-06-16 DIAGNOSIS — M6281 Muscle weakness (generalized): Secondary | ICD-10-CM | POA: Diagnosis not present

## 2017-06-22 DIAGNOSIS — S50812D Abrasion of left forearm, subsequent encounter: Secondary | ICD-10-CM | POA: Diagnosis not present

## 2017-06-22 DIAGNOSIS — M6281 Muscle weakness (generalized): Secondary | ICD-10-CM | POA: Diagnosis not present

## 2017-06-22 DIAGNOSIS — I2510 Atherosclerotic heart disease of native coronary artery without angina pectoris: Secondary | ICD-10-CM | POA: Diagnosis not present

## 2017-06-22 DIAGNOSIS — M25552 Pain in left hip: Secondary | ICD-10-CM | POA: Diagnosis not present

## 2017-06-22 DIAGNOSIS — I10 Essential (primary) hypertension: Secondary | ICD-10-CM | POA: Diagnosis not present

## 2017-06-22 DIAGNOSIS — R261 Paralytic gait: Secondary | ICD-10-CM | POA: Diagnosis not present

## 2017-06-23 ENCOUNTER — Telehealth: Payer: Self-pay

## 2017-06-23 DIAGNOSIS — M6281 Muscle weakness (generalized): Secondary | ICD-10-CM | POA: Diagnosis not present

## 2017-06-23 DIAGNOSIS — M25552 Pain in left hip: Secondary | ICD-10-CM | POA: Diagnosis not present

## 2017-06-23 DIAGNOSIS — R261 Paralytic gait: Secondary | ICD-10-CM | POA: Diagnosis not present

## 2017-06-23 DIAGNOSIS — I2510 Atherosclerotic heart disease of native coronary artery without angina pectoris: Secondary | ICD-10-CM | POA: Diagnosis not present

## 2017-06-23 DIAGNOSIS — S50812D Abrasion of left forearm, subsequent encounter: Secondary | ICD-10-CM | POA: Diagnosis not present

## 2017-06-23 DIAGNOSIS — I10 Essential (primary) hypertension: Secondary | ICD-10-CM | POA: Diagnosis not present

## 2017-06-23 NOTE — Telephone Encounter (Signed)
-----   Message from Jenean Lindau, MD sent at 06/23/2017  2:29 PM EDT ----- Her anticoagulation is managed by her primary care doctor.  They need to review this echo report and her primary care doctor and neurology must determine whether she needs to continue anticoagulation.  I have discussed this issue with the patient and in view of her gait instability mentioned that I would be very unfavorable to her continuing on anticoagulation considering that benefit risk ratio does not benefit her Jenean Lindau, MD 06/23/2017 2:28 PM

## 2017-06-23 NOTE — Telephone Encounter (Signed)
Contacted patient's son per DPR. Advised that Dr. Geraldo Pitter would like for the patient to review anticoagulation with primary care physician and neurologist to determine risk versus benefit as patient has history of falls.   Julie Mcgee states that the patient has seen primary care and neurology since appointment with Dr. Geraldo Pitter. Julie Mcgee states that they spent a lot of time discussing this with the neurologist. Julie Mcgee states that the patient has spent a lot of time with physical therapy and gait has approved. Julie Mcgee, the patient, and neuorologist agreed patient should stay on warfarin at this time.   Will make Dr. Geraldo Pitter aware. No further questions.

## 2017-06-24 DIAGNOSIS — I10 Essential (primary) hypertension: Secondary | ICD-10-CM | POA: Diagnosis not present

## 2017-06-24 DIAGNOSIS — M6281 Muscle weakness (generalized): Secondary | ICD-10-CM | POA: Diagnosis not present

## 2017-06-24 DIAGNOSIS — I2510 Atherosclerotic heart disease of native coronary artery without angina pectoris: Secondary | ICD-10-CM | POA: Diagnosis not present

## 2017-06-24 DIAGNOSIS — M25552 Pain in left hip: Secondary | ICD-10-CM | POA: Diagnosis not present

## 2017-06-24 DIAGNOSIS — S50812D Abrasion of left forearm, subsequent encounter: Secondary | ICD-10-CM | POA: Diagnosis not present

## 2017-06-24 DIAGNOSIS — R261 Paralytic gait: Secondary | ICD-10-CM | POA: Diagnosis not present

## 2017-06-27 DIAGNOSIS — R261 Paralytic gait: Secondary | ICD-10-CM | POA: Diagnosis not present

## 2017-06-27 DIAGNOSIS — I2510 Atherosclerotic heart disease of native coronary artery without angina pectoris: Secondary | ICD-10-CM | POA: Diagnosis not present

## 2017-06-27 DIAGNOSIS — S50812D Abrasion of left forearm, subsequent encounter: Secondary | ICD-10-CM | POA: Diagnosis not present

## 2017-06-27 DIAGNOSIS — M6281 Muscle weakness (generalized): Secondary | ICD-10-CM | POA: Diagnosis not present

## 2017-06-27 DIAGNOSIS — M25552 Pain in left hip: Secondary | ICD-10-CM | POA: Diagnosis not present

## 2017-06-27 DIAGNOSIS — I10 Essential (primary) hypertension: Secondary | ICD-10-CM | POA: Diagnosis not present

## 2017-06-30 DIAGNOSIS — S50812D Abrasion of left forearm, subsequent encounter: Secondary | ICD-10-CM | POA: Diagnosis not present

## 2017-06-30 DIAGNOSIS — I10 Essential (primary) hypertension: Secondary | ICD-10-CM | POA: Diagnosis not present

## 2017-06-30 DIAGNOSIS — M25552 Pain in left hip: Secondary | ICD-10-CM | POA: Diagnosis not present

## 2017-06-30 DIAGNOSIS — I2510 Atherosclerotic heart disease of native coronary artery without angina pectoris: Secondary | ICD-10-CM | POA: Diagnosis not present

## 2017-06-30 DIAGNOSIS — M6281 Muscle weakness (generalized): Secondary | ICD-10-CM | POA: Diagnosis not present

## 2017-06-30 DIAGNOSIS — R261 Paralytic gait: Secondary | ICD-10-CM | POA: Diagnosis not present

## 2017-07-04 DIAGNOSIS — Z7901 Long term (current) use of anticoagulants: Secondary | ICD-10-CM | POA: Diagnosis not present

## 2017-07-04 DIAGNOSIS — D6851 Activated protein C resistance: Secondary | ICD-10-CM | POA: Diagnosis not present

## 2017-07-06 DIAGNOSIS — I2510 Atherosclerotic heart disease of native coronary artery without angina pectoris: Secondary | ICD-10-CM | POA: Diagnosis not present

## 2017-07-06 DIAGNOSIS — I10 Essential (primary) hypertension: Secondary | ICD-10-CM | POA: Diagnosis not present

## 2017-07-06 DIAGNOSIS — R261 Paralytic gait: Secondary | ICD-10-CM | POA: Diagnosis not present

## 2017-07-06 DIAGNOSIS — M6281 Muscle weakness (generalized): Secondary | ICD-10-CM | POA: Diagnosis not present

## 2017-07-06 DIAGNOSIS — S50812D Abrasion of left forearm, subsequent encounter: Secondary | ICD-10-CM | POA: Diagnosis not present

## 2017-07-06 DIAGNOSIS — M25552 Pain in left hip: Secondary | ICD-10-CM | POA: Diagnosis not present

## 2017-07-13 DIAGNOSIS — I10 Essential (primary) hypertension: Secondary | ICD-10-CM | POA: Diagnosis not present

## 2017-07-13 DIAGNOSIS — M6281 Muscle weakness (generalized): Secondary | ICD-10-CM | POA: Diagnosis not present

## 2017-07-13 DIAGNOSIS — R261 Paralytic gait: Secondary | ICD-10-CM | POA: Diagnosis not present

## 2017-07-13 DIAGNOSIS — I2510 Atherosclerotic heart disease of native coronary artery without angina pectoris: Secondary | ICD-10-CM | POA: Diagnosis not present

## 2017-07-13 DIAGNOSIS — M25552 Pain in left hip: Secondary | ICD-10-CM | POA: Diagnosis not present

## 2017-07-13 DIAGNOSIS — S50812D Abrasion of left forearm, subsequent encounter: Secondary | ICD-10-CM | POA: Diagnosis not present

## 2017-07-20 DIAGNOSIS — R2689 Other abnormalities of gait and mobility: Secondary | ICD-10-CM | POA: Diagnosis not present

## 2017-07-20 DIAGNOSIS — I69353 Hemiplegia and hemiparesis following cerebral infarction affecting right non-dominant side: Secondary | ICD-10-CM | POA: Diagnosis not present

## 2017-07-20 DIAGNOSIS — Z9181 History of falling: Secondary | ICD-10-CM | POA: Diagnosis not present

## 2017-07-26 DIAGNOSIS — Z9181 History of falling: Secondary | ICD-10-CM | POA: Diagnosis not present

## 2017-07-26 DIAGNOSIS — R2689 Other abnormalities of gait and mobility: Secondary | ICD-10-CM | POA: Diagnosis not present

## 2017-07-26 DIAGNOSIS — I69353 Hemiplegia and hemiparesis following cerebral infarction affecting right non-dominant side: Secondary | ICD-10-CM | POA: Diagnosis not present

## 2017-07-29 DIAGNOSIS — I69353 Hemiplegia and hemiparesis following cerebral infarction affecting right non-dominant side: Secondary | ICD-10-CM | POA: Diagnosis not present

## 2017-07-29 DIAGNOSIS — R2689 Other abnormalities of gait and mobility: Secondary | ICD-10-CM | POA: Diagnosis not present

## 2017-07-29 DIAGNOSIS — Z9181 History of falling: Secondary | ICD-10-CM | POA: Diagnosis not present

## 2017-08-03 DIAGNOSIS — R2689 Other abnormalities of gait and mobility: Secondary | ICD-10-CM | POA: Diagnosis not present

## 2017-08-03 DIAGNOSIS — I69353 Hemiplegia and hemiparesis following cerebral infarction affecting right non-dominant side: Secondary | ICD-10-CM | POA: Diagnosis not present

## 2017-08-03 DIAGNOSIS — Z9181 History of falling: Secondary | ICD-10-CM | POA: Diagnosis not present

## 2017-08-05 DIAGNOSIS — R2689 Other abnormalities of gait and mobility: Secondary | ICD-10-CM | POA: Diagnosis not present

## 2017-08-05 DIAGNOSIS — Z9181 History of falling: Secondary | ICD-10-CM | POA: Diagnosis not present

## 2017-08-05 DIAGNOSIS — I69353 Hemiplegia and hemiparesis following cerebral infarction affecting right non-dominant side: Secondary | ICD-10-CM | POA: Diagnosis not present

## 2017-08-09 ENCOUNTER — Ambulatory Visit: Payer: Medicare Other | Admitting: Adult Health

## 2017-08-09 DIAGNOSIS — Z9181 History of falling: Secondary | ICD-10-CM | POA: Diagnosis not present

## 2017-08-09 DIAGNOSIS — R2689 Other abnormalities of gait and mobility: Secondary | ICD-10-CM | POA: Diagnosis not present

## 2017-08-09 DIAGNOSIS — I69353 Hemiplegia and hemiparesis following cerebral infarction affecting right non-dominant side: Secondary | ICD-10-CM | POA: Diagnosis not present

## 2017-08-16 DIAGNOSIS — I69353 Hemiplegia and hemiparesis following cerebral infarction affecting right non-dominant side: Secondary | ICD-10-CM | POA: Diagnosis not present

## 2017-08-16 DIAGNOSIS — R2689 Other abnormalities of gait and mobility: Secondary | ICD-10-CM | POA: Diagnosis not present

## 2017-08-16 DIAGNOSIS — Z9181 History of falling: Secondary | ICD-10-CM | POA: Diagnosis not present

## 2017-08-19 DIAGNOSIS — I69353 Hemiplegia and hemiparesis following cerebral infarction affecting right non-dominant side: Secondary | ICD-10-CM | POA: Diagnosis not present

## 2017-08-19 DIAGNOSIS — R2689 Other abnormalities of gait and mobility: Secondary | ICD-10-CM | POA: Diagnosis not present

## 2017-08-19 DIAGNOSIS — Z9181 History of falling: Secondary | ICD-10-CM | POA: Diagnosis not present

## 2017-08-24 DIAGNOSIS — Z9181 History of falling: Secondary | ICD-10-CM | POA: Diagnosis not present

## 2017-08-24 DIAGNOSIS — R2689 Other abnormalities of gait and mobility: Secondary | ICD-10-CM | POA: Diagnosis not present

## 2017-08-24 DIAGNOSIS — I69353 Hemiplegia and hemiparesis following cerebral infarction affecting right non-dominant side: Secondary | ICD-10-CM | POA: Diagnosis not present

## 2017-08-26 DIAGNOSIS — Z9181 History of falling: Secondary | ICD-10-CM | POA: Diagnosis not present

## 2017-08-26 DIAGNOSIS — I69353 Hemiplegia and hemiparesis following cerebral infarction affecting right non-dominant side: Secondary | ICD-10-CM | POA: Diagnosis not present

## 2017-08-26 DIAGNOSIS — R2689 Other abnormalities of gait and mobility: Secondary | ICD-10-CM | POA: Diagnosis not present

## 2017-08-30 DIAGNOSIS — I69353 Hemiplegia and hemiparesis following cerebral infarction affecting right non-dominant side: Secondary | ICD-10-CM | POA: Diagnosis not present

## 2017-08-30 DIAGNOSIS — R2689 Other abnormalities of gait and mobility: Secondary | ICD-10-CM | POA: Diagnosis not present

## 2017-08-30 DIAGNOSIS — Z9181 History of falling: Secondary | ICD-10-CM | POA: Diagnosis not present

## 2017-08-30 DIAGNOSIS — Z7901 Long term (current) use of anticoagulants: Secondary | ICD-10-CM | POA: Diagnosis not present

## 2017-08-30 DIAGNOSIS — D6851 Activated protein C resistance: Secondary | ICD-10-CM | POA: Diagnosis not present

## 2017-09-02 DIAGNOSIS — Z9181 History of falling: Secondary | ICD-10-CM | POA: Diagnosis not present

## 2017-09-02 DIAGNOSIS — R2689 Other abnormalities of gait and mobility: Secondary | ICD-10-CM | POA: Diagnosis not present

## 2017-09-02 DIAGNOSIS — I69353 Hemiplegia and hemiparesis following cerebral infarction affecting right non-dominant side: Secondary | ICD-10-CM | POA: Diagnosis not present

## 2017-09-06 DIAGNOSIS — I69353 Hemiplegia and hemiparesis following cerebral infarction affecting right non-dominant side: Secondary | ICD-10-CM | POA: Diagnosis not present

## 2017-09-06 DIAGNOSIS — R2689 Other abnormalities of gait and mobility: Secondary | ICD-10-CM | POA: Diagnosis not present

## 2017-09-06 DIAGNOSIS — Z9181 History of falling: Secondary | ICD-10-CM | POA: Diagnosis not present

## 2017-09-09 DIAGNOSIS — R2689 Other abnormalities of gait and mobility: Secondary | ICD-10-CM | POA: Diagnosis not present

## 2017-09-09 DIAGNOSIS — Z9181 History of falling: Secondary | ICD-10-CM | POA: Diagnosis not present

## 2017-09-09 DIAGNOSIS — I69353 Hemiplegia and hemiparesis following cerebral infarction affecting right non-dominant side: Secondary | ICD-10-CM | POA: Diagnosis not present

## 2017-09-13 DIAGNOSIS — Z9181 History of falling: Secondary | ICD-10-CM | POA: Diagnosis not present

## 2017-09-13 DIAGNOSIS — R2689 Other abnormalities of gait and mobility: Secondary | ICD-10-CM | POA: Diagnosis not present

## 2017-09-13 DIAGNOSIS — I69353 Hemiplegia and hemiparesis following cerebral infarction affecting right non-dominant side: Secondary | ICD-10-CM | POA: Diagnosis not present

## 2017-09-16 DIAGNOSIS — R2689 Other abnormalities of gait and mobility: Secondary | ICD-10-CM | POA: Diagnosis not present

## 2017-09-16 DIAGNOSIS — I69353 Hemiplegia and hemiparesis following cerebral infarction affecting right non-dominant side: Secondary | ICD-10-CM | POA: Diagnosis not present

## 2017-09-16 DIAGNOSIS — Z9181 History of falling: Secondary | ICD-10-CM | POA: Diagnosis not present

## 2017-10-14 ENCOUNTER — Other Ambulatory Visit: Payer: Self-pay

## 2017-10-14 ENCOUNTER — Emergency Department (HOSPITAL_COMMUNITY)
Admission: EM | Admit: 2017-10-14 | Discharge: 2017-10-14 | Disposition: A | Discharge status: Home / Self Care | Payer: Medicare Other | Attending: Emergency Medicine | Admitting: Emergency Medicine

## 2017-10-14 ENCOUNTER — Encounter (HOSPITAL_COMMUNITY): Payer: Self-pay

## 2017-10-14 ENCOUNTER — Emergency Department (HOSPITAL_COMMUNITY): Payer: Medicare Other

## 2017-10-14 DIAGNOSIS — Z23 Encounter for immunization: Secondary | ICD-10-CM | POA: Insufficient documentation

## 2017-10-14 DIAGNOSIS — R58 Hemorrhage, not elsewhere classified: Secondary | ICD-10-CM | POA: Diagnosis not present

## 2017-10-14 DIAGNOSIS — Z87891 Personal history of nicotine dependence: Secondary | ICD-10-CM | POA: Diagnosis not present

## 2017-10-14 DIAGNOSIS — E039 Hypothyroidism, unspecified: Secondary | ICD-10-CM | POA: Insufficient documentation

## 2017-10-14 DIAGNOSIS — I10 Essential (primary) hypertension: Secondary | ICD-10-CM | POA: Insufficient documentation

## 2017-10-14 DIAGNOSIS — Z8673 Personal history of transient ischemic attack (TIA), and cerebral infarction without residual deficits: Secondary | ICD-10-CM | POA: Insufficient documentation

## 2017-10-14 DIAGNOSIS — S0990XA Unspecified injury of head, initial encounter: Secondary | ICD-10-CM | POA: Diagnosis not present

## 2017-10-14 DIAGNOSIS — Z79899 Other long term (current) drug therapy: Secondary | ICD-10-CM | POA: Insufficient documentation

## 2017-10-14 DIAGNOSIS — I959 Hypotension, unspecified: Secondary | ICD-10-CM | POA: Diagnosis not present

## 2017-10-14 DIAGNOSIS — E785 Hyperlipidemia, unspecified: Secondary | ICD-10-CM | POA: Diagnosis not present

## 2017-10-14 DIAGNOSIS — R55 Syncope and collapse: Secondary | ICD-10-CM | POA: Diagnosis not present

## 2017-10-14 DIAGNOSIS — Z743 Need for continuous supervision: Secondary | ICD-10-CM | POA: Diagnosis not present

## 2017-10-14 DIAGNOSIS — R0902 Hypoxemia: Secondary | ICD-10-CM | POA: Diagnosis not present

## 2017-10-14 LAB — URINALYSIS, ROUTINE W REFLEX MICROSCOPIC
Bilirubin Urine: NEGATIVE
Glucose, UA: NEGATIVE mg/dL
Ketones, ur: 5 mg/dL — AB
Nitrite: NEGATIVE
Protein, ur: NEGATIVE mg/dL
Specific Gravity, Urine: 1.011 (ref 1.005–1.030)
pH: 7 (ref 5.0–8.0)

## 2017-10-14 LAB — CBC
HCT: 39.8 % (ref 36.0–46.0)
Hemoglobin: 12.8 g/dL (ref 12.0–15.0)
MCH: 29.4 pg (ref 26.0–34.0)
MCHC: 32.2 g/dL (ref 30.0–36.0)
MCV: 91.5 fL (ref 78.0–100.0)
Platelets: 272 10*3/uL (ref 150–400)
RBC: 4.35 MIL/uL (ref 3.87–5.11)
RDW: 14.1 % (ref 11.5–15.5)
WBC: 10 10*3/uL (ref 4.0–10.5)

## 2017-10-14 LAB — PROTIME-INR
INR: 2.05
Prothrombin Time: 22.9 seconds — ABNORMAL HIGH (ref 11.4–15.2)

## 2017-10-14 LAB — BASIC METABOLIC PANEL
Anion gap: 12 (ref 5–15)
BUN: 12 mg/dL (ref 8–23)
CO2: 24 mmol/L (ref 22–32)
Calcium: 9.6 mg/dL (ref 8.9–10.3)
Chloride: 101 mmol/L (ref 98–111)
Creatinine, Ser: 0.87 mg/dL (ref 0.44–1.00)
GFR calc Af Amer: >60 mL/min (ref >60)
GFR calc non Af Amer: >60 mL/min (ref >60)
Glucose, Bld: 98 mg/dL (ref 70–99)
Potassium: 3.9 mmol/L (ref 3.5–5.1)
Sodium: 137 mmol/L (ref 135–145)

## 2017-10-14 LAB — CBG MONITORING, ED: Glucose-Capillary: 91 mg/dL (ref 70–99)

## 2017-10-14 LAB — I-STAT TROPONIN, ED: Troponin i, poc: 0.04 ng/mL (ref 0.00–0.08)

## 2017-10-14 LAB — MAGNESIUM: Magnesium: 1.8 mg/dL (ref 1.7–2.4)

## 2017-10-14 MED ORDER — SODIUM CHLORIDE 0.9 % IV BOLUS
1000.0000 mL | Freq: Once | INTRAVENOUS | Status: AC
  Administered 2017-10-14: 1000 mL via INTRAVENOUS

## 2017-10-14 MED ORDER — TETANUS-DIPHTH-ACELL PERTUSSIS 5-2.5-18.5 LF-MCG/0.5 IM SUSP
0.5000 mL | Freq: Once | INTRAMUSCULAR | Status: AC
  Administered 2017-10-14: 0.5 mL via INTRAMUSCULAR
  Filled 2017-10-14: qty 0.5

## 2017-10-14 NOTE — ED Provider Notes (Signed)
See the patient in signout from Dr. Sherry Ruffing.  Briefly the patient had multiple syncopal episodes today.  Both seem to be orthostatic in nature.  She has been able to ambulate here without difficulty has been able to eat.  Currently is asymptomatic.  We are awaiting a urine if that is negative will discharge home.  UA with TNTC bacteria though no whites or reds.  I feel this is unlikely to represent infection.  She has no urinary complaints.  Will discharge home.   Deno Etienne, DO 10/14/17 1643

## 2017-10-14 NOTE — Discharge Instructions (Signed)
Eat and drink well for the next couple days.  Return for worsening symptoms, chest pain, repeat syncope

## 2017-10-14 NOTE — ED Triage Notes (Signed)
Per [facility], pt had Witnessed syncopal episode in the kitchen where pt hit the bridge of her nose and has an abrasion, pt helped up and passed back out. Pt is on coumadin. EKG showed 1st degree av block and RBBB. Hx of cva 7 years ago. Pt is disoriented to situation with repetitive questioning. BP 180/90, HR 62, RR 16, spo2 98% on RA, CBG 116. Pt has some baseline confusion but this is abnormal for her.

## 2017-10-14 NOTE — ED Provider Notes (Signed)
[facility] Provider Note   CSN: [id number] Arrival date & time: 10/14/17  1122     History   Chief Complaint Chief Complaint  Patient presents with  . Loss of Consciousness    HPI Julie Mcgee is a 84 y.o. female.  The history is provided by the patient, a relative and medical records. No language interpreter was used.  Loss of Consciousness   This is a new problem. The current episode started less than 1 hour ago. The problem occurs constantly. The problem has been resolved. She lost consciousness for a period of less than one minute. The problem is associated with standing up. Associated symptoms include headaches (mild) and light-headedness. Pertinent negatives include abdominal pain, back pain, bowel incontinence, chest pain, confusion, congestion, diaphoresis, dizziness, fever, focal sensory loss, focal weakness, malaise/fatigue, nausea, palpitations, seizures, slurred speech, vertigo, visual change, vomiting and weakness. She has tried nothing for the symptoms. The treatment provided no relief.    Past Medical History:  Diagnosis Date  . Anticoagulant long-term use   . Arthritis   . Carotid artery stenosis   . CVA (cerebral vascular accident) ([place])   . Former smoker    1/2 ppd for 45 years  . Hyperlipidemia   . Hypertension   . Hyponatremia   . Hypothyroidism   . Peripheral vascular disease [facility])     Patient Active Problem List   Diagnosis Date Noted  . Moderate to severe aortic stenosis 05/31/2017  . Current use of long term anticoagulation 05/31/2017  . History of stroke 05/31/2017  . Unstable gait 05/31/2017  . Recurrent falls 05/31/2017  . Hypertension 05/13/2017  . Hyperlipidemia 05/13/2017  . Hypothyroidism 05/13/2017  . Arthritis 05/13/2017    Past Surgical History:  Procedure Laterality Date  . BASAL CELL CARCINOMA EXCISION  2018  . COLONOSCOPY  2001  . HIP SURGERY  2005  . KNEE SURGERY  1998     OB  History   None      Home Medications    Prior to Admission medications   Medication Sig Start Date End Date Taking? Authorizing Provider  calcium carbonate (OSCAL) 1500 (600 Ca) MG TABS tablet Take 1 tablet by mouth 2 (two) times daily with a meal.    [provider]  hydrochlorothiazide (HYDRODIURIL) 12.5 MG tablet Take 12.5 mg by mouth daily.    [provider]  levothyroxine (SYNTHROID, LEVOTHROID) 100 MCG tablet Take 100 mcg by mouth daily before breakfast.    [provider]  metoprolol succinate (TOPROL-XL) 50 MG 24 hr tablet Take 50 mg by mouth 2 (two) times daily. Take with or immediately following a meal.    [provider]  metoprolol tartrate (LOPRESSOR) 50 MG tablet Take 1 tablet by mouth 2 (two) times daily. 04/22/17   [provider]  Multiple Vitamins-Minerals (MULTIVITAMIN ADULTS 50+) TABS Take 1 tablet by mouth daily.    [provider]  simvastatin (ZOCOR) 80 MG tablet Take 80 mg by mouth every evening.    [provider]  warfarin (COUMADIN) 2 MG tablet Take 2 mg by mouth daily.    [provider]    Family History History reviewed. No pertinent family history.  Social History Social History   Tobacco Use  . Smoking status: Former Smoker    Packs/day: 0.50    Years: 45.00    Pack years: 22.50    Types: Cigarettes    Last attempt to quit: 05/23/2009  Years since quitting: 8.4  . Smokeless tobacco: Never Used  Substance Use Topics  . Alcohol use: Not Currently  . Drug use: Not on file     Allergies   Patient has no known allergies.   Review of Systems Review of Systems  Constitutional: Negative for chills, diaphoresis, fatigue, fever and malaise/fatigue.  HENT: Negative for congestion.   Eyes: Negative for photophobia and visual disturbance.  Respiratory: Negative for cough, chest tightness, shortness of breath and wheezing.   Cardiovascular: Positive for syncope. Negative for  chest pain and palpitations.  Gastrointestinal: Negative for abdominal pain, bowel incontinence, nausea and vomiting.  Genitourinary: Negative for flank pain.  Musculoskeletal: Negative for back pain, neck pain and neck stiffness.  Skin: Positive for wound. Negative for color change and rash.  Neurological: Positive for syncope, light-headedness and headaches (mild). Negative for dizziness, vertigo, focal weakness, seizures, facial asymmetry, speech difficulty, weakness and numbness.  Psychiatric/Behavioral: Negative for agitation and confusion.  All other systems reviewed and are negative.    Physical Exam Updated Vital Signs BP (!) 141/57   Pulse (!) 58   Temp 98.3 F (36.8 C) (Oral)   Resp (!) 22   Ht 5\' 2"  (1.575 m)   Wt 90.7 kg   SpO2 98%   BMI 36.58 kg/m   Physical Exam  Constitutional: She is oriented to person, place, and time. She appears well-developed and well-nourished. No distress.  HENT:  Head: Head is with abrasion.    Mouth/Throat: Oropharynx is clear and moist. No oropharyngeal exudate.  Eyes: Pupils are equal, round, and reactive to light. Conjunctivae and EOM are normal.  Neck: Normal range of motion. Neck supple.  Cardiovascular: Normal rate, regular rhythm and intact distal pulses.  No murmur heard. Pulmonary/Chest: Effort normal and breath sounds normal. No stridor. No respiratory distress. She has no wheezes. She has no rales. She exhibits no tenderness.  Abdominal: Soft. She exhibits no distension. There is no tenderness.  Musculoskeletal: She exhibits no edema or tenderness.  Neurological: She is alert and oriented to person, place, and time. She displays normal reflexes. No cranial nerve deficit or sensory deficit. She exhibits normal muscle tone. Coordination ( Ataxia with finger-nose-finger testing on the right.  This is unchanged from baseline per report.) abnormal.  Skin: Skin is warm and dry. Capillary refill takes less than 2 seconds. No rash  noted. She is not diaphoretic. No erythema.  Psychiatric: She has a normal mood and affect.  Nursing note and vitals reviewed.    ED Treatments / Results  Labs (all labs ordered are listed, but only abnormal results are displayed) Labs Reviewed  URINALYSIS, ROUTINE W REFLEX MICROSCOPIC - Abnormal; Notable for the following components:      Result Value   APPearance CLOUDY (*)    Hgb urine dipstick MODERATE (*)    Ketones, ur 5 (*)    Leukocytes, UA SMALL (*)    Bacteria, UA MANY (*)    All other components within normal limits  PROTIME-INR - Abnormal; Notable for the following components:   Prothrombin Time 22.9 (*)    All other components within normal limits  BASIC METABOLIC PANEL  CBC  MAGNESIUM  CBG MONITORING, ED  I-STAT TROPONIN, ED    EKG EKG Interpretation  Date/Time:  Friday October 14 2017 11:28:52 EDT Ventricular Rate:  65 PR Interval:    QRS Duration: 156 QT Interval:  485 QTC Calculation: 505 R Axis:   68 Text Interpretation:  Sinus rhythm Probable left  atrial enlargement Right bundle branch block No prior ECG for comparison.  No STEMI Confirmed by Antony Blackbird [phone]) on 10/14/2017 12:12:25 PM   Radiology Ct Head Wo Contrast  Result Date: 10/14/2017 CLINICAL DATA:  Witnessed syncopal episode, fell in kitchen striking bridge of nose; history hypertension, former smoker, stroke EXAM: CT HEAD WITHOUT CONTRAST TECHNIQUE: Contiguous axial images were obtained from the base of the skull through the vertex without intravenous contrast. Sagittal and coronal MPR images reconstructed from axial data set. COMPARISON:  None FINDINGS: Brain: Generalized atrophy. Normal ventricular morphology. No midline shift or mass effect. Small vessel chronic ischemic changes of deep cerebral white matter. Large old BILATERAL cerebellar infarcts. No intracranial hemorrhage, mass lesion or evidence of acute infarction. No extra-axial fluid collections. Vascular: Atherosclerotic  calcification of internal carotid arteries at skull base Skull: Demineralized but intact Sinuses/Orbits: Clear Other: N/A IMPRESSION: Atrophy with small vessel chronic ischemic changes of deep cerebral white matter. Large old BILATERAL cerebellar infarcts. No acute intracranial abnormalities. Electronically Signed   By: Lavonia Dana M.D.   On: 10/14/2017 13:01    Procedures Procedures (including critical care time)  Medications Ordered in ED Medications  sodium chloride 0.9 % bolus 1,000 mL (0 mLs Intravenous Stopped 10/14/17 1510)  Tdap (BOOSTRIX) injection 0.5 mL (0.5 mLs Intramuscular Given 10/14/17 1625)     Initial Impression / Assessment and Plan / ED Course  I have reviewed the triage vital signs and the nursing notes.  Pertinent labs & imaging results that were available during my care of the patient were reviewed by me and considered in my medical decision making (see chart for details).     Julie Mcgee is a 84 y.o. female with a past medical history significant for prior stroke on Coumadin therapy, hyper tension, hyperlipidemia, and carotid stenosis who presents for syncopal episode.  Patient reports that she had 2 syncopal episodes today.  She reports that she was in the kitchen and got lightheaded and passed out.  She reports that after standing back up again she passed out a second time.  Episode lasted less than 10 seconds each episode.  She had no persistent unconsciousness.  She denied any palpitations, chest pain, or shortness of breath.  She denies any preceding symptoms such as fevers, chills, cough, congestion, urinary symptoms or GI symptoms.  She thinks she is staying relatively well hydrated.  She denies recent medication changes.  She reports striking her bridge of her nose on the wall when she passed out the first time.  She is unsure of her last tetanus shot.  She reports mild headache but denies any vision changes or unilateral neurologic deficits.  On exam, lungs  clear chest nontender.  Abdomen nontender.  Patient had ataxia with finger-nose finger testing on right arm which she reports is at her baseline.  No new focal neurologic deficit seen.  Normal extra ocular movements and pupils.  Clear speech.  Patient's heart rate is normal and she is not hypotensive.  No nasal septal hematoma seen.  No significant maxillary or mandibular tenderness.  Due to fall causing an abrasion to her face and mild headache on Coumadin, CT head was ordered.  CT was reassuring.  No evidence of fracture or dislocation.    Patient other screen laboratory testing to look for etiologies of syncope.  Patient had no urinary symptoms but will obtain urine to look for convincing UTI.    Patient was given fluids during initial work-up.  After fluids she reports feeling  even better and no longer feels any lightheadedness.  Patient's troponin was negative.  Glucose normal.  Magnesium and metabolic panel reassuring.  INR was therapeutic at 2.05 and CBC shows no anemia or leukocytosis.    Care transferred to Dr. Tyrone Nine while awaiting results of urinalysis.  Anticipate discharge after urinalysis.  Patient was able to ambulate without difficulty and was able to tolerate p.o.    Final Clinical Impressions(s) / ED Diagnoses   Final diagnoses:  Syncope and collapse    ED Discharge Orders    None      Clinical Impression: 1. Syncope and collapse     Disposition: Discharge  Condition: Good  I have discussed the results, Dx and Tx plan with the pt(& family if present). He/she/they expressed understanding and agree(s) with the plan. Discharge instructions discussed at great length. Strict return precautions discussed and pt &/or family have verbalized understanding of the instructions. No further questions at time of discharge.    Discharge Medication List as of 10/14/2017  4:25 PM      Follow Up: Glenford Bayley, DO [place] [address] [phone]  In 1  week      Teriana Danker, Gwenyth Allegra, MD 10/14/17 [phone]

## 2017-10-14 NOTE — ED Notes (Signed)
Pt stated she needed to use restroom. Pt ambulated to restroom across from room with one assist while using walker.

## 2017-10-24 DIAGNOSIS — D6851 Activated protein C resistance: Secondary | ICD-10-CM | POA: Diagnosis not present

## 2017-10-24 DIAGNOSIS — Z7901 Long term (current) use of anticoagulants: Secondary | ICD-10-CM | POA: Diagnosis not present

## 2017-11-30 DIAGNOSIS — I080 Rheumatic disorders of both mitral and aortic valves: Secondary | ICD-10-CM | POA: Diagnosis not present

## 2017-11-30 DIAGNOSIS — Z23 Encounter for immunization: Secondary | ICD-10-CM | POA: Diagnosis not present

## 2017-11-30 DIAGNOSIS — Z7901 Long term (current) use of anticoagulants: Secondary | ICD-10-CM | POA: Diagnosis not present

## 2017-11-30 DIAGNOSIS — I639 Cerebral infarction, unspecified: Secondary | ICD-10-CM | POA: Diagnosis not present

## 2017-11-30 DIAGNOSIS — I10 Essential (primary) hypertension: Secondary | ICD-10-CM | POA: Diagnosis not present

## 2018-02-18 ENCOUNTER — Other Ambulatory Visit: Payer: Self-pay

## 2018-02-18 ENCOUNTER — Emergency Department (HOSPITAL_COMMUNITY): Payer: Medicare Other

## 2018-02-18 ENCOUNTER — Encounter (HOSPITAL_COMMUNITY): Payer: Self-pay | Admitting: Internal Medicine

## 2018-02-18 ENCOUNTER — Inpatient Hospital Stay (HOSPITAL_COMMUNITY)
Admission: EM | Admit: 2018-02-18 | Discharge: 2018-02-22 | DRG: 287 | Disposition: A | Discharge status: Home Health | Payer: Medicare Other | Attending: Internal Medicine | Admitting: Internal Medicine

## 2018-02-18 DIAGNOSIS — R0989 Other specified symptoms and signs involving the circulatory and respiratory systems: Secondary | ICD-10-CM | POA: Diagnosis not present

## 2018-02-18 DIAGNOSIS — Z79890 Hormone replacement therapy (postmenopausal): Secondary | ICD-10-CM

## 2018-02-18 DIAGNOSIS — D6489 Other specified anemias: Secondary | ICD-10-CM | POA: Diagnosis present

## 2018-02-18 DIAGNOSIS — I2510 Atherosclerotic heart disease of native coronary artery without angina pectoris: Secondary | ICD-10-CM | POA: Diagnosis present

## 2018-02-18 DIAGNOSIS — E785 Hyperlipidemia, unspecified: Secondary | ICD-10-CM | POA: Diagnosis present

## 2018-02-18 DIAGNOSIS — Z8673 Personal history of transient ischemic attack (TIA), and cerebral infarction without residual deficits: Secondary | ICD-10-CM

## 2018-02-18 DIAGNOSIS — Z7901 Long term (current) use of anticoagulants: Secondary | ICD-10-CM

## 2018-02-18 DIAGNOSIS — R0602 Shortness of breath: Secondary | ICD-10-CM

## 2018-02-18 DIAGNOSIS — I5031 Acute diastolic (congestive) heart failure: Secondary | ICD-10-CM | POA: Diagnosis not present

## 2018-02-18 DIAGNOSIS — I110 Hypertensive heart disease with heart failure: Principal | ICD-10-CM | POA: Diagnosis present

## 2018-02-18 DIAGNOSIS — Z87891 Personal history of nicotine dependence: Secondary | ICD-10-CM

## 2018-02-18 DIAGNOSIS — E039 Hypothyroidism, unspecified: Secondary | ICD-10-CM | POA: Diagnosis not present

## 2018-02-18 DIAGNOSIS — Z6835 Body mass index (BMI) 35.0-35.9, adult: Secondary | ICD-10-CM

## 2018-02-18 DIAGNOSIS — J90 Pleural effusion, not elsewhere classified: Secondary | ICD-10-CM | POA: Diagnosis not present

## 2018-02-18 DIAGNOSIS — Z85828 Personal history of other malignant neoplasm of skin: Secondary | ICD-10-CM

## 2018-02-18 DIAGNOSIS — Z79899 Other long term (current) drug therapy: Secondary | ICD-10-CM | POA: Diagnosis not present

## 2018-02-18 DIAGNOSIS — I5033 Acute on chronic diastolic (congestive) heart failure: Secondary | ICD-10-CM | POA: Diagnosis not present

## 2018-02-18 DIAGNOSIS — I509 Heart failure, unspecified: Secondary | ICD-10-CM | POA: Diagnosis not present

## 2018-02-18 DIAGNOSIS — D72829 Elevated white blood cell count, unspecified: Secondary | ICD-10-CM | POA: Diagnosis present

## 2018-02-18 DIAGNOSIS — I248 Other forms of acute ischemic heart disease: Secondary | ICD-10-CM | POA: Diagnosis present

## 2018-02-18 DIAGNOSIS — I10 Essential (primary) hypertension: Secondary | ICD-10-CM | POA: Diagnosis not present

## 2018-02-18 DIAGNOSIS — R0609 Other forms of dyspnea: Secondary | ICD-10-CM | POA: Diagnosis present

## 2018-02-18 DIAGNOSIS — I739 Peripheral vascular disease, unspecified: Secondary | ICD-10-CM | POA: Diagnosis present

## 2018-02-18 DIAGNOSIS — I69322 Dysarthria following cerebral infarction: Secondary | ICD-10-CM | POA: Diagnosis not present

## 2018-02-18 DIAGNOSIS — I350 Nonrheumatic aortic (valve) stenosis: Secondary | ICD-10-CM | POA: Diagnosis not present

## 2018-02-18 DIAGNOSIS — R079 Chest pain, unspecified: Secondary | ICD-10-CM | POA: Diagnosis not present

## 2018-02-18 DIAGNOSIS — I080 Rheumatic disorders of both mitral and aortic valves: Secondary | ICD-10-CM | POA: Diagnosis present

## 2018-02-18 DIAGNOSIS — I272 Pulmonary hypertension, unspecified: Secondary | ICD-10-CM | POA: Diagnosis present

## 2018-02-18 DIAGNOSIS — D649 Anemia, unspecified: Secondary | ICD-10-CM

## 2018-02-18 DIAGNOSIS — R0902 Hypoxemia: Secondary | ICD-10-CM | POA: Diagnosis not present

## 2018-02-18 DIAGNOSIS — R296 Repeated falls: Secondary | ICD-10-CM | POA: Diagnosis present

## 2018-02-18 DIAGNOSIS — Z66 Do not resuscitate: Secondary | ICD-10-CM | POA: Diagnosis present

## 2018-02-18 DIAGNOSIS — Z7982 Long term (current) use of aspirin: Secondary | ICD-10-CM

## 2018-02-18 DIAGNOSIS — Z8249 Family history of ischemic heart disease and other diseases of the circulatory system: Secondary | ICD-10-CM

## 2018-02-18 DIAGNOSIS — R0789 Other chest pain: Secondary | ICD-10-CM | POA: Diagnosis not present

## 2018-02-18 DIAGNOSIS — I351 Nonrheumatic aortic (valve) insufficiency: Secondary | ICD-10-CM | POA: Diagnosis not present

## 2018-02-18 DIAGNOSIS — I340 Nonrheumatic mitral (valve) insufficiency: Secondary | ICD-10-CM | POA: Diagnosis not present

## 2018-02-18 DIAGNOSIS — I050 Rheumatic mitral stenosis: Secondary | ICD-10-CM | POA: Diagnosis not present

## 2018-02-18 DIAGNOSIS — R0600 Dyspnea, unspecified: Secondary | ICD-10-CM | POA: Diagnosis present

## 2018-02-18 HISTORY — DX: Unspecified right bundle-branch block: I45.10

## 2018-02-18 LAB — CBC WITH DIFFERENTIAL/PLATELET
Abs Immature Granulocytes: 0.03 10*3/uL (ref 0.00–0.07)
Basophils Absolute: 0 10*3/uL (ref 0.0–0.1)
Basophils Relative: 0 %
Eosinophils Absolute: 0.1 10*3/uL (ref 0.0–0.5)
Eosinophils Relative: 1 %
HCT: 28.9 % — ABNORMAL LOW (ref 36.0–46.0)
Hemoglobin: 8.8 g/dL — ABNORMAL LOW (ref 12.0–15.0)
Immature Granulocytes: 0 %
Lymphocytes Relative: 7 %
Lymphs Abs: 0.8 10*3/uL (ref 0.7–4.0)
MCH: 27.7 pg (ref 26.0–34.0)
MCHC: 30.4 g/dL (ref 30.0–36.0)
MCV: 90.9 fL (ref 80.0–100.0)
Monocytes Absolute: 0.6 10*3/uL (ref 0.1–1.0)
Monocytes Relative: 6 %
Neutro Abs: 9.8 10*3/uL — ABNORMAL HIGH (ref 1.7–7.7)
Neutrophils Relative %: 86 %
Platelets: 294 10*3/uL (ref 150–400)
RBC: 3.18 MIL/uL — ABNORMAL LOW (ref 3.87–5.11)
RDW: 15 % (ref 11.5–15.5)
WBC: 11.4 10*3/uL — ABNORMAL HIGH (ref 4.0–10.5)
nRBC: 0 % (ref 0.0–0.2)

## 2018-02-18 LAB — PROTIME-INR
INR: 1.13
Prothrombin Time: 14.4 seconds (ref 11.4–15.2)

## 2018-02-18 LAB — URINALYSIS, ROUTINE W REFLEX MICROSCOPIC
Bilirubin Urine: NEGATIVE
Glucose, UA: NEGATIVE mg/dL
Hgb urine dipstick: NEGATIVE
Ketones, ur: NEGATIVE mg/dL
Nitrite: NEGATIVE
Protein, ur: NEGATIVE mg/dL
Specific Gravity, Urine: 1.019 (ref 1.005–1.030)
pH: 5 (ref 5.0–8.0)

## 2018-02-18 LAB — TSH: TSH: 3.355 u[IU]/mL (ref 0.350–4.500)

## 2018-02-18 LAB — I-STAT TROPONIN, ED: Troponin i, poc: 0.04 ng/mL (ref 0.00–0.08)

## 2018-02-18 LAB — BASIC METABOLIC PANEL
Anion gap: 11 (ref 5–15)
BUN: 23 mg/dL (ref 8–23)
CO2: 23 mmol/L (ref 22–32)
Calcium: 9.2 mg/dL (ref 8.9–10.3)
Chloride: 102 mmol/L (ref 98–111)
Creatinine, Ser: 0.84 mg/dL (ref 0.44–1.00)
GFR calc Af Amer: >60 mL/min (ref >60)
GFR calc non Af Amer: >60 mL/min (ref >60)
Glucose, Bld: 119 mg/dL — ABNORMAL HIGH (ref 70–99)
Potassium: 4.6 mmol/L (ref 3.5–5.1)
Sodium: 136 mmol/L (ref 135–145)

## 2018-02-18 LAB — TROPONIN I: Troponin I: 0.12 ng/mL (ref <0.03)

## 2018-02-18 LAB — PROCALCITONIN: Procalcitonin: <0.1 ng/mL

## 2018-02-18 LAB — BRAIN NATRIURETIC PEPTIDE: B Natriuretic Peptide: 735.4 pg/mL — ABNORMAL HIGH (ref 0.0–100.0)

## 2018-02-18 MED ORDER — ONDANSETRON HCL 4 MG PO TABS: 4.0000 mg | ORAL_TABLET | Freq: Four times a day (QID) | ORAL | Status: DC | PRN

## 2018-02-18 MED ORDER — FUROSEMIDE 10 MG/ML IJ SOLN
40.0000 mg | Freq: Once | INTRAMUSCULAR | Status: AC
  Administered 2018-02-18: 40 mg via INTRAVENOUS
  Filled 2018-02-18: qty 4

## 2018-02-18 MED ORDER — ACETAMINOPHEN 650 MG RE SUPP: 650.0000 mg | Freq: Four times a day (QID) | RECTAL | Status: DC | PRN

## 2018-02-18 MED ORDER — ATORVASTATIN CALCIUM 40 MG PO TABS
40.0000 mg | ORAL_TABLET | Freq: Every day | ORAL | Status: DC
  Administered 2018-02-19 – 2018-02-21 (×3): 40 mg via ORAL
  Filled 2018-02-18 (×3): qty 1

## 2018-02-18 MED ORDER — ENOXAPARIN SODIUM 40 MG/0.4ML ~~LOC~~ SOLN
40.0000 mg | SUBCUTANEOUS | Status: DC
  Administered 2018-02-18 – 2018-02-21 (×4): 40 mg via SUBCUTANEOUS
  Filled 2018-02-18 (×4): qty 0.4

## 2018-02-18 MED ORDER — LEVOTHYROXINE SODIUM 100 MCG PO TABS
100.0000 ug | ORAL_TABLET | Freq: Every day | ORAL | Status: DC
  Administered 2018-02-19 – 2018-02-22 (×4): 100 ug via ORAL
  Filled 2018-02-18 (×4): qty 1

## 2018-02-18 MED ORDER — FUROSEMIDE 10 MG/ML IJ SOLN
40.0000 mg | Freq: Every day | INTRAMUSCULAR | Status: DC
  Administered 2018-02-18 – 2018-02-19 (×2): 40 mg via INTRAVENOUS
  Filled 2018-02-18 (×2): qty 4

## 2018-02-18 MED ORDER — ACETAMINOPHEN 325 MG PO TABS: 650.0000 mg | ORAL_TABLET | Freq: Four times a day (QID) | ORAL | Status: DC | PRN

## 2018-02-18 MED ORDER — ONDANSETRON HCL 4 MG/2ML IJ SOLN: 4.0000 mg | Freq: Four times a day (QID) | INTRAMUSCULAR | Status: DC | PRN

## 2018-02-18 MED ORDER — ASPIRIN EC 325 MG PO TBEC
325.0000 mg | DELAYED_RELEASE_TABLET | Freq: Every day | ORAL | Status: DC
  Administered 2018-02-19 – 2018-02-22 (×4): 325 mg via ORAL
  Filled 2018-02-18 (×4): qty 1

## 2018-02-18 MED ORDER — TRAMADOL HCL 50 MG PO TABS: 50.0000 mg | ORAL_TABLET | Freq: Four times a day (QID) | ORAL | Status: DC | PRN

## 2018-02-18 MED ORDER — METOPROLOL TARTRATE 50 MG PO TABS
50.0000 mg | ORAL_TABLET | Freq: Two times a day (BID) | ORAL | Status: DC
  Administered 2018-02-18 – 2018-02-22 (×7): 50 mg via ORAL
  Filled 2018-02-18 (×8): qty 1

## 2018-02-18 MED ORDER — POLYETHYLENE GLYCOL 3350 17 G PO PACK: 17.0000 g | PACK | Freq: Every day | ORAL | Status: DC | PRN

## 2018-02-18 NOTE — ED Provider Notes (Signed)
[facility] EMERGENCY DEPARTMENT Provider Note   CSN: [id number] Arrival date & time: 02/18/18  1250     History   Chief Complaint Chief Complaint  Patient presents with  . Shortness of Breath  . Chest Pain    HPI Julie Mcgee is a 85 y.o. female.  85 y.o female with a PMH of CVA, HTN, Aortic stenosis presents to the ED with a chief complaint of shortness of breath and chest pain x yesterday. Patient reports exertional chest pain which she describes as an intermittent ache on the right side of her chest. She reports the pain is worse with standing from her chair and better with sitting down and resting.  She also endorses some shortness of breath when these episodes come in. According to medical records patient was seen in the ED in October due to syncopal episode. She reports following up with "a cardiologist at [facility]", but I am unable to track this records. She denies any previous Cardiac history. She is currently on coumadin. She denies any fever, chills, headache, abdominal pain or urinary symptoms.      Past Medical History:  Diagnosis Date  . Anticoagulant long-term use   . Arthritis   . Carotid artery stenosis   . CVA (cerebral vascular accident) ([place])   . Former smoker    1/2 ppd for 45 years  . Hyperlipidemia   . Hypertension   . Hyponatremia   . Hypothyroidism   . Peripheral vascular disease [facility])     Patient Active Problem List   Diagnosis Date Noted  . Moderate to severe aortic stenosis 05/31/2017  . Current use of long term anticoagulation 05/31/2017  . History of stroke 05/31/2017  . Unstable gait 05/31/2017  . Recurrent falls 05/31/2017  . Hypertension 05/13/2017  . Hyperlipidemia 05/13/2017  . Hypothyroidism 05/13/2017  . Arthritis 05/13/2017    Past Surgical History:  Procedure Laterality Date  . BASAL CELL CARCINOMA EXCISION  2018  . COLONOSCOPY  2001  . HIP SURGERY  2005  . KNEE SURGERY  1998     OB History   No  obstetric history on file.      Home Medications    Prior to Admission medications   Medication Sig Start Date End Date Taking? Authorizing Provider  aspirin EC 325 MG tablet Take 325 mg by mouth daily.   Yes [provider]  Calcium Carb-Cholecalciferol (CALTRATE 600+D3 PO) Take 1 tablet by mouth 2 (two) times daily.   Yes [provider]  hydrochlorothiazide (HYDRODIURIL) 12.5 MG tablet Take 12.5 mg by mouth daily.   Yes [provider]  levothyroxine (SYNTHROID, LEVOTHROID) 100 MCG tablet Take 100 mcg by mouth daily before breakfast.   Yes [provider]  metoprolol tartrate (LOPRESSOR) 50 MG tablet Take 50 mg by mouth 2 (two) times daily.   Yes [provider]  Multiple Vitamins-Minerals (MULTIVITAMIN ADULTS 50+) TABS Take 1 tablet by mouth daily.   Yes [provider]  simvastatin (ZOCOR) 80 MG tablet Take 80 mg by mouth every evening.   Yes [provider]    Family History No family history on file.  Social History Social History   Tobacco Use  . Smoking status: Former Smoker    Packs/day: 0.50    Years: 45.00    Pack years: 22.50    Types: Cigarettes    Last attempt to quit: 05/23/2009    Years since quitting: 8.7  . Smokeless tobacco: Never Used  Substance  Use Topics  . Alcohol use: Not Currently  . Drug use: Not on file     Allergies   Adhesive [tape]   Review of Systems Review of Systems  Constitutional: Negative for chills and fever.  HENT: Negative for ear pain and sore throat.   Eyes: Negative for pain and visual disturbance.  Respiratory: Positive for shortness of breath. Negative for cough.   Cardiovascular: Positive for chest pain. Negative for palpitations.  Gastrointestinal: Negative for abdominal pain and vomiting.  Genitourinary: Negative for dysuria and hematuria.  Musculoskeletal: Negative for arthralgias and back pain.  Skin: Negative for color change and rash.  Neurological:  Negative for seizures, syncope, facial asymmetry and headaches.  All other systems reviewed and are negative.    Physical Exam Updated Vital Signs BP (!) 142/45   Pulse 61   Temp 98.2 F (36.8 C) (Oral)   Resp 18   Ht 5\' 2"  (1.575 m)   Wt 90.7 kg   SpO2 96%   BMI 36.58 kg/m   Physical Exam Vitals signs and nursing note reviewed.  Constitutional:      General: She is not in acute distress.    Appearance: She is well-developed.     Comments: No acute distress, non ill appearing with husband at the bedside.   HENT:     Head: Normocephalic and atraumatic.     Mouth/Throat:     Pharynx: No oropharyngeal exudate.  Eyes:     Pupils: Pupils are equal, round, and reactive to light.  Neck:     Musculoskeletal: Normal range of motion.  Cardiovascular:     Rate and Rhythm: Regular rhythm.     Heart sounds: Murmur present. Systolic murmur present.  Pulmonary:     Effort: Pulmonary effort is normal. No respiratory distress.     Breath sounds: Decreased breath sounds present. No wheezing, rhonchi or rales.  Abdominal:     General: Bowel sounds are normal. There is no distension.     Palpations: Abdomen is soft.     Tenderness: There is no abdominal tenderness.  Musculoskeletal:        General: No tenderness or deformity.     Right lower leg: No edema.     Left lower leg: No edema.  Skin:    General: Skin is warm and dry.  Neurological:     Mental Status: She is alert and oriented to person, place, and time.     Comments: Alert, oriented, thought content appropriate. Speech fluent without evidence of aphasia. Able to follow 2 step commands without difficulty.  Cranial Nerves:  II:  Peripheral visual fields grossly normal, pupils, round, reactive to light III,IV, VI: ptosis not present, extra-ocular motions intact bilaterally  V,VII: smile symmetric, facial light touch sensation equal VIII: hearing grossly normal bilaterally  IX,X: midline uvula rise  XI: bilateral shoulder  shrug equal and strong XII: midline tongue extension  Motor:  4/5 on right upper extremities and lower, previous CVA deficit. 5/5 in rest of upper and lower extremities bilaterally including strong and equal grip strength and dorsiflexion/plantar flexion Sensory: light touch normal in all extremities.  Cerebellar: normal finger-to-nose with bilateral upper extremities, pronator drift negative.       ED Treatments / Results  Labs (all labs ordered are listed, but only abnormal results are displayed) Labs Reviewed  CBC WITH DIFFERENTIAL/PLATELET - Abnormal; Notable for the following components:      Result Value   WBC 11.4 (*)    RBC 3.18 (*)  Hemoglobin 8.8 (*)    HCT 28.9 (*)    Neutro Abs 9.8 (*)    All other components within normal limits  BASIC METABOLIC PANEL - Abnormal; Notable for the following components:   Glucose, Bld 119 (*)    All other components within normal limits  URINALYSIS, ROUTINE W REFLEX MICROSCOPIC - Abnormal; Notable for the following components:   APPearance HAZY (*)    Leukocytes, UA MODERATE (*)    Bacteria, UA RARE (*)    All other components within normal limits  BRAIN NATRIURETIC PEPTIDE - Abnormal; Notable for the following components:   B Natriuretic Peptide 735.4 (*)    All other components within normal limits  PROTIME-INR  I-STAT TROPONIN, ED    EKG EKG Interpretation  Date/Time:  Saturday February 18 2018 12:56:25 EST Ventricular Rate:  61 PR Interval:    QRS Duration: 152 QT Interval:  497 QTC Calculation: 501 R Axis:   84 Text Interpretation:  Sinus rhythm Right bundle branch block No significant change since last tracing Confirmed by Wandra Arthurs [phone]) on 02/18/2018 3:40:49 PM   Radiology Dg Chest 2 View  Result Date: 02/18/2018 CLINICAL DATA:  Episodes of SOB on exertion for about 2 days, pt also reports she had a episode of non radiating centralized cp along with nausea that lasted around 5 min EXAM: CHEST - 2 VIEW  COMPARISON:  None. FINDINGS: The heart is enlarged. There is significant mitral annulus calcification. Pulmonary vascular congestion is present. Patchy opacities are identified in the RIGHT UPPER lobe and RIGHT LOWER lobe. There is an associated RIGHT pleural effusion. L1 compression fracture is chronic. IMPRESSION: 1. Cardiomegaly and pulmonary vascular congestion. 2. RIGHT lung infiltrates and pleural effusion. Electronically Signed   By: Nolon Nations M.D.   On: 02/18/2018 14:18    Procedures Procedures (including critical care time)  Medications Ordered in ED Medications  furosemide (LASIX) injection 40 mg (40 mg Intravenous Given 02/18/18 1647)     Initial Impression / Assessment and Plan / ED Course  I have reviewed the triage vital signs and the nursing notes.  Pertinent labs & imaging results that were available during my care of the patient were reviewed by me and considered in my medical decision making (see chart for details).     Presents from home with shortness of breath, chest pain on exertion for 2 days.  EMS reported patient was at 90% on room air.  Also endorses some nausea with this chest pain.  Was given aspirin by EMS prior to arrival.  Evaluation patient is resting comfortably satting at 95%.  No tachycardia.  She does have a previous visit in the month of October, reports she had a syncopal episode, was released from the ED and according to daughter she followed up with a cardiologist, unsure who followed her care.  No previous records for this on chart. BC shows slight elevation of white blood cell count, hemoglobin is significantly decreased at 8.8 since last visit of 12.8, denies any bleeding.  BMP showed no electrolyte changes, creatinine is within normal limits.  Troponin was negative.  DG chest x-ray showed: 1. Cardiomegaly and pulmonary vascular congestion.  2. RIGHT lung infiltrates and pleural effusion.   A BNP has been ordered, patient does have a previous  history of aortic stenosis, unsure whether her shortness of breath is due to artifact versus infiltrates.  3:55 PM IV Lasix 40 mg given.   4:36 PM BNP is 735.4  At this  time I believe patient will need to be further evaluate due to cardiac , pneumonia and symptomatic anemia. Call placed for hospitalist.   4:54 PM Spoke to hospitalist, patient will be admitted.   Final Clinical Impressions(s) / ED Diagnoses   Final diagnoses:  Low hemoglobin  Chest pain, unspecified type  Shortness of breath    ED Discharge Orders    None       Janeece Fitting, PA-C 02/18/18 1654    Drenda Freeze, MD 02/20/18 [phone]

## 2018-02-18 NOTE — H&P (Signed)
[facility] History and Physical  Julie Mcgee ZOX:[id number] DOB: 01/10/1934 DOA: 02/18/2018  Referring physician: Janeece Fitting, PA PCP: Glenford Bayley, DO   Chief Complaint: shortness of breath and chest pain  HPI: Julie Mcgee is a 85 y.o. female with medical history significant for HTN, aortic stenosis, hypertension, history of stroke (2011) who presents on 02/18/2018 with 2 days of progressive dyspnea on exertion.  She states noticing worsening shortness of breath while walking back and forth from her bedroom to the living room over the past 2 days.  Typically she is fairly active and does not have difficulty with breathing.  With rest it tended to resolve on its own, she did have an occasional nonproductive cough but otherwise denied any fevers, chills, recent sick contacts.  She has not had any changes to her medications.  On the day of admission she also noticed some slight chest pain described as an achy feeling in the middle of the chest that was nonradiating and not associated with nausea, vomiting.  She is unsure if this occurred during her episodes of dyspnea.  This morning was the first time she noticed significant lower leg swelling.  She has no history of CHF preserved on diuretics.  She was seen by her cardiologist (who follows her for aortic stenosis) on 05/31/2017 at which time her warfarin for stroke was discontinued given recent history of falls.  She was seen by neurology on 06/07/2017 and she is since remained on full dose aspirin.  Last evaluated in ED on 10/2017 for syncopal episodes.  She denies any recurrent falls or syncope since that presentation.    ED Course:  Afebrile, blood pressure 139/56. UA negative WBC BC 11.4, hemoglobin 8.8 BNP 735 X-ray with cardiomegaly, pulmonary vascular congestion, right lung infiltrates and pleural effusion Given IV Lasix.  [facility] hospital[facility] was called for admission.  Review of Systems (positive bolded):  Constitutional:  No  weight loss, night sweats, Fevers, chills, fatigue.  HEENT:  No headaches, Difficulty swallowing,Tooth/dental problems,Sore throat,  No sneezing, itching, ear ache, nasal congestion, post nasal drip,  Cardio-vascular:  No chest pain, Orthopnea, PND, swelling in lower extremities, anasarca, dizziness, palpitations  GI:  No heartburn, indigestion, abdominal pain, nausea, vomiting, diarrhea, change in bowel habits, loss of appetite  Resp:  shortness of breath with exertion . No excess mucus, no productive cough,  non-productive cough, No coughing up of blood.No change in color of mucus.No wheezing.No chest wall deformity  Skin:  no rash or lesions.  GU:  no dysuria, change in color of urine, no urgency or frequency. No flank pain.  Musculoskeletal:  No joint pain or swelling. No decreased range of motion. No back pain.  Psych:  No change in mood or affect. No depression or anxiety. No memory loss.   Past Medical History:  Diagnosis Date  . Anticoagulant long-term use   . Arthritis   . Carotid artery stenosis   . CVA (cerebral vascular accident) ([place])   . Former smoker    1/2 ppd for 45 years  . Hyperlipidemia   . Hypertension   . Hyponatremia   . Hypothyroidism   . Peripheral vascular disease [facility])    Past Surgical History:  Procedure Laterality Date  . BASAL CELL CARCINOMA EXCISION  2018  . COLONOSCOPY  2001  . HIP SURGERY  2005  . KNEE SURGERY  1998   Social History:  reports that she quit smoking about 8 years ago. Her smoking use included cigarettes. She has a  45.00 pack-year smoking history. She has never used smokeless tobacco. She reports previous alcohol use. No history on file for drug.  Allergies  Allergen Reactions  . Adhesive [Tape] Other (See Comments)    Causes BLISTERS    Family History  Problem Relation Age of Onset  . Heart failure Mother   . Chronic Renal Failure Mother   . Alcohol abuse Father     Prior to Admission medications   Medication Sig  Start Date End Date Taking? Authorizing Provider  aspirin EC 325 MG tablet Take 325 mg by mouth daily.   Yes [provider]  Calcium Carb-Cholecalciferol (CALTRATE 600+D3 PO) Take 1 tablet by mouth 2 (two) times daily.   Yes [provider]  hydrochlorothiazide (HYDRODIURIL) 12.5 MG tablet Take 12.5 mg by mouth daily.   Yes [provider]  levothyroxine (SYNTHROID, LEVOTHROID) 100 MCG tablet Take 100 mcg by mouth daily before breakfast.   Yes [provider]  metoprolol tartrate (LOPRESSOR) 50 MG tablet Take 50 mg by mouth 2 (two) times daily.   Yes [provider]  Multiple Vitamins-Minerals (MULTIVITAMIN ADULTS 50+) TABS Take 1 tablet by mouth daily.   Yes [provider]  simvastatin (ZOCOR) 80 MG tablet Take 80 mg by mouth every evening.   Yes [provider]   Physical Exam: Vitals:   02/18/18 1445 02/18/18 1515 02/18/18 1615 02/18/18 1745  BP: (!) 146/56  (!) 142/45 (!) 139/56  Pulse: 62 60 61 60  Resp: (!) 23 17 18 18   Temp:      TempSrc:      SpO2: 96% 95% 96%   Weight:      Height:        Wt Readings from Last 3 Encounters:  02/18/18 90.7 kg  10/14/17 90.7 kg  06/07/17 92.1 kg    Constitutional normal appearing elderly female, lying in bed in no distress Eyes: EOMI, anicteric, normal conjunctivae ENMT: Oropharynx with moist mucous membranes Cardiovascular: RRR with loud systolic murmur heard loudest in second left intercostal space, JVD positive to mid neck, 1+ pitting edema to both ankles Respiratory: Normal respiratory effort on room air, crackles heard at bases Abdomen: Soft,non-tender,  Musculoskeletal: no clubbing / cyanosis. No joint deformity upper and lower extremities. Good ROM, no contractures. Normal muscle tone. Skin: No rash ulcers, or lesions. Without skin tenting  Neurologic: Grossly no focal neuro deficit. Psychiatric:Appropriate affect, and mood. Mental status AAOx3          Labs on  Admission:  Basic Metabolic Panel: Recent Labs  Lab 02/18/18 1319  NA 136  K 4.6  CL 102  CO2 23  GLUCOSE 119*  BUN 23  CREATININE 0.84  CALCIUM 9.2   Liver Function Tests: No results for input(s): AST, ALT, ALKPHOS, BILITOT, PROT, ALBUMIN in the last 168 hours. No results for input(s): LIPASE, AMYLASE in the last 168 hours. No results for input(s): AMMONIA in the last 168 hours. CBC: Recent Labs  Lab 02/18/18 1319  WBC 11.4*  NEUTROABS 9.8*  HGB 8.8*  HCT 28.9*  MCV 90.9  PLT 294   Cardiac Enzymes: No results for input(s): CKTOTAL, CKMB, CKMBINDEX, TROPONINI in the last 168 hours.  BNP (last 3 results) Recent Labs    02/18/18 1319  BNP 735.4*    ProBNP (last 3 results) No results for input(s): PROBNP in the last 8760 hours.  CBG: No results for input(s): GLUCAP in the last 168 hours.  Radiological Exams on Admission: Dg Chest 2  View  Result Date: 02/18/2018 CLINICAL DATA:  Episodes of SOB on exertion for about 2 days, pt also reports she had a episode of non radiating centralized cp along with nausea that lasted around 5 min EXAM: CHEST - 2 VIEW COMPARISON:  None. FINDINGS: The heart is enlarged. There is significant mitral annulus calcification. Pulmonary vascular congestion is present. Patchy opacities are identified in the RIGHT UPPER lobe and RIGHT LOWER lobe. There is an associated RIGHT pleural effusion. L1 compression fracture is chronic. IMPRESSION: 1. Cardiomegaly and pulmonary vascular congestion. 2. RIGHT lung infiltrates and pleural effusion. Electronically Signed   By: Nolon Nations M.D.   On: 02/18/2018 14:18    EKG: Independently reviewed. unchanged from previous tracings, normal sinus rhythm, RBBB.  Assessment/Plan Active Problems:   Hypertension   Hyperlipidemia   Hypothyroidism   Moderate to severe aortic stenosis   History of stroke   CHF (congestive heart failure) (HCC)   Dyspnea on exertion   Leukocytosis   1. Dyspnea on  exertion x2 days, most concerning for new onset CHF, stable.  Given elevated BNP, vascular congestion and cardiomegaly, peripheral edema and positive JVD I think this is most consistent with new onset CHF.  We will follow-up with TTE for better evaluation.  Continue IV Lasix, strict I's and O's, daily weights.   She also has acute on chronic anemia which could contribute to shortness of breath but given above symptoms think CHF is most likely.  2. Right lung infiltrates and pleural effusion. I think infection is less likely given she is afebrile, no productive cough, no risk factors, though does have nonspecific slight white count, follow-up procalcitonin.  Pleural effusion likely related to presumed CHF.  Currently not requiring any supplemental oxygen.  3. Acute on chronic anemia.  Previous baseline hemoglobin of 11 on 11/2017.  8.8 on admission.  Denies any melena, hematuria or other signs of bleeding.  Follow daily CBC.  4. Chest pain, atypical, currently resolved.  Occurring at rest, described as achy, and initial troponin negative.  Do not suspect cardiac etiology, nonischemic EKG.  Continue tropoin trend. Will be able to assess for wall motion abnormalities on TTE.  5. Leukocytosis.  Slight elevation in WBC to 11.  Patient afebrile with no localizing signs or symptoms of infection.   Blood cultures if spikes temperature hold off on antibiotics for now as infection less likely as mentioned above.  6. Moderate to severe aortic stenosis.  Followed by cardiology as outpatient (Dr.Renvankar, [facility]).  Evaluate on TTE.  7. Hypothyroidism.  Check TSH, continue home Synthroid  8. Hyperlipidemia, stable.  Continue statin  9. Hypertension, stable continue metoprolol tartrate.  Hold home HCTZ while on IV Lasix.  10. History of stroke, with right hand weakness and some dysarthria, stable.  Continue full dose aspirin.  Was previously on warfarin that was discontinued several months ago  by cardiologist    Consultants: None Code Status: DNR, discussed on day of admission  DVT Prophylaxis:lovenox Family Communication: husband and son updated at bedside  Disposition Plan: admitted as inpatient, expect > 2 midnight stay given need for IV lasix for likely new CHF, evaluation of aortic stenosis and anemia.      Desiree Hane MD [facility]  Pager [phone]  If 7PM-7AM, please contact night-coverage [email or web address] Password [facility]  02/18/2018, 6:04 PM

## 2018-02-18 NOTE — ED Triage Notes (Addendum)
Pt brought in by ems for c.o sob with exertion x2 days; pt denies any cough ; pt also reports she had a episode of non radiating centralized cp along with nausea that lasted around 5 min ; pt states she is still having some chest pain and some nausea  ; ems reports patient was 90 % on RA upon arrival ; pt received 324 of asa prior to arrival

## 2018-02-19 DIAGNOSIS — I350 Nonrheumatic aortic (valve) stenosis: Secondary | ICD-10-CM

## 2018-02-19 DIAGNOSIS — I509 Heart failure, unspecified: Secondary | ICD-10-CM

## 2018-02-19 DIAGNOSIS — R0609 Other forms of dyspnea: Secondary | ICD-10-CM

## 2018-02-19 DIAGNOSIS — D72829 Elevated white blood cell count, unspecified: Secondary | ICD-10-CM

## 2018-02-19 LAB — BASIC METABOLIC PANEL
Anion gap: 15 (ref 5–15)
BUN: 22 mg/dL (ref 8–23)
CO2: 26 mmol/L (ref 22–32)
Calcium: 8.7 mg/dL — ABNORMAL LOW (ref 8.9–10.3)
Chloride: 99 mmol/L (ref 98–111)
Creatinine, Ser: 1.09 mg/dL — ABNORMAL HIGH (ref 0.44–1.00)
GFR calc Af Amer: 55 mL/min — ABNORMAL LOW (ref >60)
GFR calc non Af Amer: 47 mL/min — ABNORMAL LOW (ref >60)
Glucose, Bld: 89 mg/dL (ref 70–99)
Potassium: 3.9 mmol/L (ref 3.5–5.1)
Sodium: 140 mmol/L (ref 135–145)

## 2018-02-19 LAB — CBC
HCT: 27.6 % — ABNORMAL LOW (ref 36.0–46.0)
Hemoglobin: 8.9 g/dL — ABNORMAL LOW (ref 12.0–15.0)
MCH: 28.6 pg (ref 26.0–34.0)
MCHC: 32.2 g/dL (ref 30.0–36.0)
MCV: 88.7 fL (ref 80.0–100.0)
Platelets: 320 10*3/uL (ref 150–400)
RBC: 3.11 MIL/uL — ABNORMAL LOW (ref 3.87–5.11)
RDW: 15 % (ref 11.5–15.5)
WBC: 10.9 10*3/uL — ABNORMAL HIGH (ref 4.0–10.5)
nRBC: 0 % (ref 0.0–0.2)

## 2018-02-19 LAB — PROCALCITONIN: Procalcitonin: <0.1 ng/mL

## 2018-02-19 LAB — TROPONIN I
Troponin I: 0.1 ng/mL (ref <0.03)
Troponin I: 0.12 ng/mL (ref <0.03)

## 2018-02-19 MED ORDER — TRAZODONE HCL 50 MG PO TABS
50.0000 mg | ORAL_TABLET | Freq: Once | ORAL | Status: AC
  Administered 2018-02-19: 50 mg via ORAL
  Filled 2018-02-19: qty 1

## 2018-02-19 MED ORDER — FUROSEMIDE 10 MG/ML IJ SOLN
40.0000 mg | Freq: Two times a day (BID) | INTRAMUSCULAR | Status: DC
  Administered 2018-02-19 – 2018-02-21 (×4): 40 mg via INTRAVENOUS
  Filled 2018-02-19 (×4): qty 4

## 2018-02-19 NOTE — Progress Notes (Deleted)
Incentive spirometer ordered. Education given. Patient demonstrated correct use of device. 

## 2018-02-19 NOTE — Progress Notes (Signed)
PROGRESS NOTE    Julie Mcgee  WSF:[id number] DOB: 01-17-1933 DOA: 02/18/2018 PCP: Glenford Bayley, DO  Outpatient Specialists:   Brief Narrative:  Julie Mcgee is an 85 year old Caucasian female, with past medical history significant for carotid artery disease, CVA, hyperlipidemia, hypertension, PVD, hyponatremia, hypothyroidism and aortic stenosis as per scanned echocardiogram done November 2015.  No recent echocardiogram visualized.  Patient has had prior syncopal episode.  Patient was admitted with dyspnea on exertion, fatigue and shortness of breath.  Patient started noticing worsening shortness of breath while walking back and forth from her bedroom to the living room about 2 days prior to presentation.  Typically, patient was fairly active and did not have difficulty with breathing.  Cardiac BNP was elevated at 735.  Troponin was 0.12 on presentation, but has remained fairly stable.  Chest x-ray done revealed cardiomegaly and pulmonary vascular congestion.  Patient is currently on IV diuretics, and seems to be improving.  Echocardiogram is pending.  Will have a low threshold to consult cardiology team.  Assessment & Plan:   Active Problems:   Hypertension   Hyperlipidemia   Hypothyroidism   Moderate to severe aortic stenosis   History of stroke   CHF (congestive heart failure) (HCC)   Dyspnea on exertion   Leukocytosis  Dyspnea on exertion x2 days, most concerning for new onset CHF, stable.  Given elevated BNP, vascular congestion and cardiomegaly, peripheral edema and positive JVD I think this is most consistent with new onset CHF.  We will follow-up with TTE for better evaluation.  Continue IV Lasix, strict I's and O's, daily weights.   She also has acute on chronic anemia which could contribute to shortness of breath but given above symptoms think CHF is most likely. 02/19/2018: Patient is improving.  Continue IV diuretics.  Pursue echocardiogram.   Right lung infiltrates and pleural  effusion. I think infection is less likely given she is afebrile, no productive cough, no risk factors, though does have nonspecific slight white count, follow-up procalcitonin.  Pleural effusion likely related to presumed CHF.  Currently not requiring any supplemental oxygen. 02/19/2018: Repeat chest x-ray in the morning.  Acute on chronic anemia.  Previous baseline hemoglobin of 11 on 11/2017.  8.8 on admission.  Denies any melena, hematuria or other signs of bleeding.  Follow daily CBC. 02/19/2018: Hemoglobin is 8.9 g/dL today.  Hemoglobin is currently stable.  Chest pain, atypical, currently resolved.  Occurring at rest, described as achy, and initial troponin negative.  Do not suspect cardiac etiology, nonischemic EKG.  Continue tropoin trend. Will be able to assess for wall motion abnormalities on TTE. 02/19/2018: Chest pain has resolved.  Leukocytosis.  Slight elevation in WBC to 11.  Patient afebrile with no localizing signs or symptoms of infection.   Blood cultures if spikes temperature hold off on antibiotics for now as infection less likely as mentioned above. 02/19/2018: Leukocytosis is resolving.  Her calcitonin is less than 0.1.  Moderate to severe aortic stenosis.  Followed by cardiology as outpatient (Dr.Renvankar, [facility] [place]).  Evaluate on TTE.  Hypothyroidism.  Check TSH, continue home Synthroid  Hyperlipidemia, stable.  Continue statin  Hypertension, stable continue metoprolol tartrate.  Hold home HCTZ while on IV Lasix.  History of stroke, with right hand weakness and some dysarthria, stable.  Continue full dose aspirin.  Was previously on warfarin that was discontinued several months ago by cardiologist  Consultants: None. Code Status: DNR. DVT Prophylaxis:lovenox Family Communication:  Daughter-in-law Disposition Plan:  Likely DC home.     Procedures:   None  Antimicrobials:   None   Subjective: Shortness of breath is  improving.  Objective: Vitals:   02/18/18 1828 02/18/18 1917 02/19/18 0029 02/19/18 0429  BP: (!) 131/50 (!) 126/55 (!) 138/51 (!) 125/47  Pulse: 64 63 61 (!) 57  Resp: 18 18 18 18   Temp: 97.6 F (36.4 C) (!) 97.4 F (36.3 C) 98.7 F (37.1 C) 98.5 F (36.9 C)  TempSrc: Oral Oral Oral Oral  SpO2: 96% 95% 94% 98%  Weight: 86.9 kg     Height: 5' 1.5" (1.562 m)       Intake/Output Summary (Last 24 hours) at 02/19/2018 1349 Last data filed at 02/19/2018 1109 Gross per 24 hour  Intake 360 ml  Output 2100 ml  Net -1740 ml   Filed Weights   02/18/18 1256 02/18/18 1828  Weight: 90.7 kg 86.9 kg    Examination:  General exam: Patient is moderately obese.  Not in any distress.  Awake, alert and oriented to time, place and person. Respiratory system: Clear to auscultation.  Cardiovascular system: S1 & S2, with ejection systolic murmur.  Minimal bilateral lower leg edema.   Gastrointestinal system: Abdomen is morbidly obese, soft and nontender.  Organs difficult to assess. Central nervous system: Alert and oriented.  Is all limbs. Extremities: Mild bilateral lower leg edema.  Data Reviewed: I have personally reviewed following labs and imaging studies  CBC: Recent Labs  Lab 02/18/18 1319 02/19/18 0548  WBC 11.4* 10.9*  NEUTROABS 9.8*  --   HGB 8.8* 8.9*  HCT 28.9* 27.6*  MCV 90.9 88.7  PLT 294 341   Basic Metabolic Panel: Recent Labs  Lab 02/18/18 1319 02/19/18 0548  NA 136 140  K 4.6 3.9  CL 102 99  CO2 23 26  GLUCOSE 119* 89  BUN 23 22  CREATININE 0.84 1.09*  CALCIUM 9.2 8.7*   GFR: Estimated Creatinine Clearance: 40.3 mL/min (A) (by C-G formula based on SCr of 1.09 mg/dL (H)). Liver Function Tests: No results for input(s): AST, ALT, ALKPHOS, BILITOT, PROT, ALBUMIN in the last 168 hours. No results for input(s): LIPASE, AMYLASE in the last 168 hours. No results for input(s): AMMONIA in the last 168 hours. Coagulation Profile: Recent Labs  Lab 02/18/18 1319   INR 1.13   Cardiac Enzymes: Recent Labs  Lab 02/18/18 1822 02/18/18 2341 02/19/18 0548  TROPONINI 0.12* 0.12* 0.10*   BNP (last 3 results) No results for input(s): PROBNP in the last 8760 hours. HbA1C: No results for input(s): HGBA1C in the last 72 hours. CBG: No results for input(s): GLUCAP in the last 168 hours. Lipid Profile: No results for input(s): CHOL, HDL, LDLCALC, TRIG, CHOLHDL, LDLDIRECT in the last 72 hours. Thyroid Function Tests: Recent Labs    02/18/18 1822  TSH 3.355   Anemia Panel: No results for input(s): VITAMINB12, FOLATE, FERRITIN, TIBC, IRON, RETICCTPCT in the last 72 hours. Urine analysis:    Component Value Date/Time   COLORURINE YELLOW 02/18/2018 1544   APPEARANCEUR HAZY (A) 02/18/2018 1544   LABSPEC 1.019 02/18/2018 1544   PHURINE 5.0 02/18/2018 1544   GLUCOSEU NEGATIVE 02/18/2018 1544   HGBUR NEGATIVE 02/18/2018 1544   BILIRUBINUR NEGATIVE 02/18/2018 1544   KETONESUR NEGATIVE 02/18/2018 1544   PROTEINUR NEGATIVE 02/18/2018 1544   NITRITE NEGATIVE 02/18/2018 1544   LEUKOCYTESUR MODERATE (A) 02/18/2018 1544   Sepsis Labs: @LABRCNTIP (procalcitonin:4,lacticidven:4)  )No results found for this or any previous visit (from the past 240  hour(s)).       Radiology Studies: Dg Chest 2 View  Result Date: 02/18/2018 CLINICAL DATA:  Episodes of SOB on exertion for about 2 days, pt also reports she had a episode of non radiating centralized cp along with nausea that lasted around 5 min EXAM: CHEST - 2 VIEW COMPARISON:  None. FINDINGS: The heart is enlarged. There is significant mitral annulus calcification. Pulmonary vascular congestion is present. Patchy opacities are identified in the RIGHT UPPER lobe and RIGHT LOWER lobe. There is an associated RIGHT pleural effusion. L1 compression fracture is chronic. IMPRESSION: 1. Cardiomegaly and pulmonary vascular congestion. 2. RIGHT lung infiltrates and pleural effusion. Electronically Signed   By: Nolon Nations M.D.   On: 02/18/2018 14:18    Scheduled Meds: . aspirin EC  325 mg Oral Daily  . atorvastatin  40 mg Oral q1800  . enoxaparin (LOVENOX) injection  40 mg Subcutaneous Q24H  . furosemide  40 mg Intravenous Daily  . levothyroxine  100 mcg Oral QAC breakfast  . metoprolol tartrate  50 mg Oral BID   Continuous Infusions:   LOS: 1 day    Time spent: 77 Minutes   Dana Allan, MD  [facility] Pager #: [phone] 7PM-7AM contact night coverage as above

## 2018-02-20 ENCOUNTER — Inpatient Hospital Stay (HOSPITAL_COMMUNITY): Payer: Medicare Other

## 2018-02-20 DIAGNOSIS — I050 Rheumatic mitral stenosis: Secondary | ICD-10-CM

## 2018-02-20 DIAGNOSIS — I10 Essential (primary) hypertension: Secondary | ICD-10-CM

## 2018-02-20 DIAGNOSIS — I5031 Acute diastolic (congestive) heart failure: Secondary | ICD-10-CM

## 2018-02-20 DIAGNOSIS — R0789 Other chest pain: Secondary | ICD-10-CM

## 2018-02-20 DIAGNOSIS — I340 Nonrheumatic mitral (valve) insufficiency: Secondary | ICD-10-CM

## 2018-02-20 DIAGNOSIS — I351 Nonrheumatic aortic (valve) insufficiency: Secondary | ICD-10-CM

## 2018-02-20 LAB — CBC WITH DIFFERENTIAL/PLATELET
Abs Immature Granulocytes: 0.03 10*3/uL (ref 0.00–0.07)
Basophils Absolute: 0 10*3/uL (ref 0.0–0.1)
Basophils Relative: 0 %
Eosinophils Absolute: 0.7 10*3/uL — ABNORMAL HIGH (ref 0.0–0.5)
Eosinophils Relative: 7 %
HCT: 29 % — ABNORMAL LOW (ref 36.0–46.0)
Hemoglobin: 9.1 g/dL — ABNORMAL LOW (ref 12.0–15.0)
Immature Granulocytes: 0 %
Lymphocytes Relative: 21 %
Lymphs Abs: 2 10*3/uL (ref 0.7–4.0)
MCH: 28 pg (ref 26.0–34.0)
MCHC: 31.4 g/dL (ref 30.0–36.0)
MCV: 89.2 fL (ref 80.0–100.0)
Monocytes Absolute: 0.9 10*3/uL (ref 0.1–1.0)
Monocytes Relative: 9 %
Neutro Abs: 6 10*3/uL (ref 1.7–7.7)
Neutrophils Relative %: 63 %
Platelets: 329 10*3/uL (ref 150–400)
RBC: 3.25 MIL/uL — ABNORMAL LOW (ref 3.87–5.11)
RDW: 14.8 % (ref 11.5–15.5)
WBC: 9.6 10*3/uL (ref 4.0–10.5)
nRBC: 0 % (ref 0.0–0.2)

## 2018-02-20 LAB — RENAL FUNCTION PANEL
Albumin: 3.1 g/dL — ABNORMAL LOW (ref 3.5–5.0)
Anion gap: 13 (ref 5–15)
BUN: 23 mg/dL (ref 8–23)
CO2: 27 mmol/L (ref 22–32)
Calcium: 8.9 mg/dL (ref 8.9–10.3)
Chloride: 97 mmol/L — ABNORMAL LOW (ref 98–111)
Creatinine, Ser: 1.02 mg/dL — ABNORMAL HIGH (ref 0.44–1.00)
GFR calc Af Amer: 59 mL/min — ABNORMAL LOW (ref >60)
GFR calc non Af Amer: 51 mL/min — ABNORMAL LOW (ref >60)
Glucose, Bld: 99 mg/dL (ref 70–99)
Phosphorus: 4.1 mg/dL (ref 2.5–4.6)
Potassium: 3.6 mmol/L (ref 3.5–5.1)
Sodium: 137 mmol/L (ref 135–145)

## 2018-02-20 LAB — MAGNESIUM: Magnesium: 2 mg/dL (ref 1.7–2.4)

## 2018-02-20 LAB — ECHOCARDIOGRAM COMPLETE
Height: 61.5 in
Weight: 3022.4 oz

## 2018-02-20 LAB — PROCALCITONIN: Procalcitonin: <0.1 ng/mL

## 2018-02-20 MED ORDER — SODIUM CHLORIDE 0.9% FLUSH: 3.0000 mL | INTRAVENOUS | Status: DC | PRN

## 2018-02-20 MED ORDER — ASPIRIN 81 MG PO CHEW
81.0000 mg | CHEWABLE_TABLET | ORAL | Status: AC
  Administered 2018-02-21: 81 mg via ORAL
  Filled 2018-02-20: qty 1

## 2018-02-20 MED ORDER — TRAZODONE HCL 50 MG PO TABS
50.0000 mg | ORAL_TABLET | Freq: Every evening | ORAL | Status: DC | PRN
  Administered 2018-02-20: 50 mg via ORAL
  Filled 2018-02-20: qty 1

## 2018-02-20 MED ORDER — SODIUM CHLORIDE 0.9% FLUSH
3.0000 mL | Freq: Two times a day (BID) | INTRAVENOUS | Status: DC
  Administered 2018-02-20: 3 mL via INTRAVENOUS

## 2018-02-20 MED ORDER — SODIUM CHLORIDE 0.9 % IV SOLN: 250.0000 mL | INTRAVENOUS | Status: DC | PRN

## 2018-02-20 MED ORDER — SODIUM CHLORIDE 0.9 % IV SOLN: INTRAVENOUS | Status: DC

## 2018-02-20 NOTE — H&P (View-Only) (Signed)
Cardiology Consultation:   Patient ID: Julie Mcgee MRN: [id number]; DOB: Feb 01, 1933  Admit date: 02/18/2018 Date of Consult: 02/20/2018  Primary Care Provider: Glenford Bayley, DO Primary Cardiologist: Jenean Lindau, MD  Primary Electrophysiologist:  None    Patient Profile:   Julie Mcgee is a 85 y.o. female with a hx of HTN, HLD, hx of CVA (2011), CAD, carotid artery disease, moderate to severe aortic stenosis, and current long-term anticoagulation who is being seen today for the evaluation of CHF exacerbation at the request of Dr. Marthenia Rolling.  History of Present Illness:   Julie Mcgee established cardiac care with Dr. Geraldo Pitter 05/2017 after she moved to [facility] from [facility]. She has a history of stroke and no documented history of atrial fibrillation. She was noted to be on chronic anticoagulation with warfarin, but has an unsteady gait and frequent falls. Dr. Geraldo Pitter recommended stopping anticoagulation and replacing with full dose ASA.  She is also on lopressor, HCTZ, and zocor.   She presented to [facility] with lower extremity swelling, SOB, and chest pain. BNP was 735. CXR with minimal pleural effusions. Troponin was mildly elevated 0.12 --> 0.12 --> 0.10.  EKG with RBBB, not new. Cardiology was consulted.   On my interview, she seems confused about the timeline of her symptoms and whether or not she had chest pain. She developed shortness of breath/dyspnea on exertion approximately 4 days ago. She describes increased shortness of breath when she walked short distances. This was accompanied by right-sided chest discomfort. The chest discomfort did not radiate. The chest discomfort was associated with the SOB/DOE and was relieved when her SOB was relieved - with rest. She also reports mild lower extremity edema. She denies orthopnea, palpitations, dizziness, syncope or recent falls, and recent illness. She has received 2 doses of IV lasix. She has not had shortness of breath since being in the  hospital, unclear if she has had symptoms while ambulating in the halls.   Echo with severe AS and moderate mitral stenosis secondary to rheumatic disease. Preserved EF.    Past Medical History:  Diagnosis Date  . Anticoagulant long-term use   . Arthritis   . Carotid artery stenosis   . CVA (cerebral vascular accident) ([place])   . Former smoker    1/2 ppd for 45 years  . Hyperlipidemia   . Hypertension   . Hyponatremia   . Hypothyroidism   . Peripheral vascular disease [facility])     Past Surgical History:  Procedure Laterality Date  . BASAL CELL CARCINOMA EXCISION  2018  . COLONOSCOPY  2001  . HIP SURGERY  2005  . KNEE SURGERY  1998     Home Medications:  Prior to Admission medications   Medication Sig Start Date End Date Taking? Authorizing Provider  aspirin EC 325 MG tablet Take 325 mg by mouth daily.   Yes [provider]  Calcium Carb-Cholecalciferol (CALTRATE 600+D3 PO) Take 1 tablet by mouth 2 (two) times daily.   Yes [provider]  hydrochlorothiazide (HYDRODIURIL) 12.5 MG tablet Take 12.5 mg by mouth daily.   Yes [provider]  levothyroxine (SYNTHROID, LEVOTHROID) 100 MCG tablet Take 100 mcg by mouth daily before breakfast.   Yes [provider]  metoprolol tartrate (LOPRESSOR) 50 MG tablet Take 50 mg by mouth 2 (two) times daily.   Yes [provider]  Multiple Vitamins-Minerals (MULTIVITAMIN ADULTS 50+) TABS Take 1 tablet by mouth daily.   Yes [provider]  simvastatin (ZOCOR) 80 MG  tablet Take 80 mg by mouth every evening.   Yes [provider]    Inpatient Medications: Scheduled Meds: . aspirin EC  325 mg Oral Daily  . atorvastatin  40 mg Oral q1800  . enoxaparin (LOVENOX) injection  40 mg Subcutaneous Q24H  . furosemide  40 mg Intravenous BID  . levothyroxine  100 mcg Oral QAC breakfast  . metoprolol tartrate  50 mg Oral BID   Continuous Infusions:  PRN Meds: acetaminophen **OR**  acetaminophen, ondansetron **OR** ondansetron (ZOFRAN) IV, polyethylene glycol  Allergies:    Allergies  Allergen Reactions  . Adhesive [Tape] Other (See Comments)    Causes BLISTERS    Social History:   Social History   Socioeconomic History  . Marital status: Married    Spouse name: Not on file  . Number of children: Not on file  . Years of education: Not on file  . Highest education level: Not on file  Occupational History  . Not on file  Social Needs  . Financial resource strain: Not on file  . Food insecurity:    Worry: Not on file    Inability: Not on file  . Transportation needs:    Medical: Not on file    Non-medical: Not on file  Tobacco Use  . Smoking status: Former Smoker    Packs/day: 1.00    Years: 45.00    Pack years: 45.00    Types: Cigarettes    Last attempt to quit: 05/23/2009    Years since quitting: 8.7  . Smokeless tobacco: Never Used  Substance and Sexual Activity  . Alcohol use: Not Currently  . Drug use: Not on file  . Sexual activity: Not on file  Lifestyle  . Physical activity:    Days per week: Not on file    Minutes per session: Not on file  . Stress: Not on file  Relationships  . Social connections:    Talks on phone: Not on file    Gets together: Not on file    Attends religious service: Not on file    Active member of club or organization: Not on file    Attends meetings of clubs or organizations: Not on file    Relationship status: Not on file  . Intimate partner violence:    Fear of current or ex partner: Not on file    Emotionally abused: Not on file    Physically abused: Not on file    Forced sexual activity: Not on file  Other Topics Concern  . Not on file  Social History Narrative  . Not on file    Family History:    Family History  Problem Relation Age of Onset  . Heart failure Mother   . Chronic Renal Failure Mother   . Alcohol abuse Father      ROS:  Please see the history of present illness.   All other  ROS reviewed and negative.     Physical Exam/Data:   Vitals:   02/20/18 0042 02/20/18 0526 02/20/18 0829 02/20/18 0955  BP: (!) 121/45 (!) 142/55 (!) 123/41   Pulse: (!) 58 (!) 56 (!) 56 65  Resp: 20 18 18    Temp: 97.6 F (36.4 C) 97.8 F (36.6 C) 98.1 F (36.7 C)   TempSrc: Oral Oral Oral   SpO2: 95% 96% 95%   Weight:  85.7 kg    Height:        Intake/Output Summary (Last 24 hours) at 02/20/2018 1454 Last data  filed at 02/20/2018 1227 Gross per 24 hour  Intake 720 ml  Output 1800 ml  Net -1080 ml   Last 3 Weights 02/20/2018 02/18/2018 02/18/2018  Weight (lbs) 188 lb 14.4 oz 191 lb 9.6 oz 200 lb  Weight (kg) 85.684 kg 86.909 kg 90.719 kg     Body mass index is 35.11 kg/m.  General:  Well nourished, well developed, in no acute distress HEENT: normal Neck: no JVD Vascular: bruits difficult given murmur  Cardiac:  normal S1, S2; RRR; 4/6 systolic murmur Lungs:  clear to auscultation bilaterally, no wheezing, rhonchi or rales, slightly diminished in bases Abd: soft, nontender, no hepatomegaly  Ext: no edema Musculoskeletal:  No deformities, BUE and BLE strength normal and equal Skin: warm and dry  Neuro:  CNs 2-12 intact, no focal abnormalities noted Psych:  Normal affect   EKG:  The EKG was personally reviewed and demonstrates:  Sinus with RBBB (not new) Telemetry:  Telemetry was personally reviewed and demonstrates:  Sinus bradycardia  Relevant CV Studies:  Echo 02/20/18: 1. The left ventricle has hyperdynamic systolic function of >82%. The cavity size was normal. There is mildly increased left ventricular wall thickness. Echo evidence of pseudonormalization in diastolic relaxation Elevated mean left atrial pressure.  2. The right ventricle has normal systolic function. The cavity was normal. There is no increase in right ventricular wall thickness.  3. Left atrial size was severely dilated.  4. The mitral valve is rheumatic. There is moderate thickening and moderate  calcification. There is severe mitral annular calcification present. moderate mitral valve stenosis.  5. The tricuspid valve is normal in structure.  6. The aortic valve is tricuspid There is severe thickening and severe calcifcation of the aortic valve. Aortic valve regurgitation is mild to moderate by color flow Doppler. The calculated aortic valve area is 0.71 cm, consistent with severe stenosis.  7. The inferior vena cava was dilated in size with >50% respiratory variability.  8. AV is thickened, calcified with restricted motion. Peak and mean gradients through the valve are 69 and 44 mm Hg repectively consistent with severe AS.  Laboratory Data:  Chemistry Recent Labs  Lab 02/18/18 1319 02/19/18 0548 02/20/18 0616  NA 136 140 137  K 4.6 3.9 3.6  CL 102 99 97*  CO2 23 26 27   GLUCOSE 119* 89 99  BUN 23 22 23   CREATININE 0.84 1.09* 1.02*  CALCIUM 9.2 8.7* 8.9  GFRNONAA >60 47* 51*  GFRAA >60 55* 59*  ANIONGAP 11 15 13     Recent Labs  Lab 02/20/18 0616  ALBUMIN 3.1*   Hematology Recent Labs  Lab 02/18/18 1319 02/19/18 0548 02/20/18 0616  WBC 11.4* 10.9* 9.6  RBC 3.18* 3.11* 3.25*  HGB 8.8* 8.9* 9.1*  HCT 28.9* 27.6* 29.0*  MCV 90.9 88.7 89.2  MCH 27.7 28.6 28.0  MCHC 30.4 32.2 31.4  RDW 15.0 15.0 14.8  PLT 294 320 329   Cardiac Enzymes Recent Labs  Lab 02/18/18 1822 02/18/18 2341 02/19/18 0548  TROPONINI 0.12* 0.12* 0.10*    Recent Labs  Lab 02/18/18 1322  TROPIPOC 0.04    BNP Recent Labs  Lab 02/18/18 1319  BNP 735.4*    DDimer No results for input(s): DDIMER in the last 168 hours.  Radiology/Studies:  Dg Chest 2 View  Result Date: 02/20/2018 CLINICAL DATA:  Congestive heart failure. EXAM: CHEST - 2 VIEW COMPARISON:  Radiographs of February 18, 2018. FINDINGS: Stable cardiomegaly. Atherosclerosis of thoracic aorta is noted. No pneumothorax is  noted. Minimal pleural effusions are noted. No consolidative process is noted. Bony thorax is  unremarkable. IMPRESSION: Minimal pleural effusions.  No consolidative process is noted. Aortic Atherosclerosis (ICD10-I70.0). Electronically Signed   By: Marijo Conception, M.D.   On: 02/20/2018 09:40   Dg Chest 2 View  Result Date: 02/18/2018 CLINICAL DATA:  Episodes of SOB on exertion for about 2 days, pt also reports she had a episode of non radiating centralized cp along with nausea that lasted around 5 min EXAM: CHEST - 2 VIEW COMPARISON:  None. FINDINGS: The heart is enlarged. There is significant mitral annulus calcification. Pulmonary vascular congestion is present. Patchy opacities are identified in the RIGHT UPPER lobe and RIGHT LOWER lobe. There is an associated RIGHT pleural effusion. L1 compression fracture is chronic. IMPRESSION: 1. Cardiomegaly and pulmonary vascular congestion. 2. RIGHT lung infiltrates and pleural effusion. Electronically Signed   By: Nolon Nations M.D.   On: 02/18/2018 14:18    Assessment and Plan:   1. Chronic diastolic heart failure - EF is preserved at greater than 65% - primary started IV lasix, she reports improvement in symptoms - BNP elevated in the setting of normal creatinine - CXR clear - transition lasix to 40 mg daily, she appears euvolemic on exam - may require BID dosing given her valvular disease - monitor pressures - stop home HCTZ - continue home meds ASA, statin, and lopressor   2. 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 Severe aortic stenosis - likely contributing to her shortness of breath - may consider TAVR (husband had TAVR 3 years ago) - needs to be followed closely   4. Chest discomfort - mildly elevated troponin: 0.12 --> 0.12 --> 0.10 - EKG with old RBBB - troponin elevation may be due to demand ischemia in the setting of new onset heart failure - HPI difficult as patient didn't remember progression of chest pain at first, prompted by her son at bedside - once  she is euvolemic without symptoms, would consider ischemic evaluation given exertional chest pain resolved with rest      For questions or updates, please contact [place] Please consult [email or web address] for contact info under     Signed, Ledora Bottcher, PA  02/20/2018 2:54 PM

## 2018-02-20 NOTE — Consult Note (Signed)
Cardiology Consultation:   Patient ID: Lowen Mansouri MRN: [id number]; DOB: Oct 12, 1933  Admit date: 02/18/2018 Date of Consult: 02/20/2018  Primary Care Provider: Glenford Bayley, DO Primary Cardiologist: Jenean Lindau, MD  Primary Electrophysiologist:  None    Patient Profile:   Dyna Figuereo is a 85 y.o. female with a hx of HTN, HLD, hx of CVA (2011), CAD, carotid artery disease, moderate to severe aortic stenosis, and current long-term anticoagulation who is being seen today for the evaluation of CHF exacerbation at the request of Dr. Marthenia Rolling.  History of Present Illness:   Ms. Murdock established cardiac care with Dr. Geraldo Pitter 05/2017 after she moved to [facility] from [facility]. She has a history of stroke and no documented history of atrial fibrillation. She was noted to be on chronic anticoagulation with warfarin, but has an unsteady gait and frequent falls. Dr. Geraldo Pitter recommended stopping anticoagulation and replacing with full dose ASA.  She is also on lopressor, HCTZ, and zocor.   She presented to [facility] with lower extremity swelling, SOB, and chest pain. BNP was 735. CXR with minimal pleural effusions. Troponin was mildly elevated 0.12 --> 0.12 --> 0.10.  EKG with RBBB, not new. Cardiology was consulted.   On my interview, she seems confused about the timeline of her symptoms and whether or not she had chest pain. She developed shortness of breath/dyspnea on exertion approximately 4 days ago. She describes increased shortness of breath when she walked short distances. This was accompanied by right-sided chest discomfort. The chest discomfort did not radiate. The chest discomfort was associated with the SOB/DOE and was relieved when her SOB was relieved - with rest. She also reports mild lower extremity edema. She denies orthopnea, palpitations, dizziness, syncope or recent falls, and recent illness. She has received 2 doses of IV lasix. She has not had shortness of breath since being in the  hospital, unclear if she has had symptoms while ambulating in the halls.   Echo with severe AS and moderate mitral stenosis secondary to rheumatic disease. Preserved EF.    Past Medical History:  Diagnosis Date  . Anticoagulant long-term use   . Arthritis   . Carotid artery stenosis   . CVA (cerebral vascular accident) ([place])   . Former smoker    1/2 ppd for 45 years  . Hyperlipidemia   . Hypertension   . Hyponatremia   . Hypothyroidism   . Peripheral vascular disease [facility])     Past Surgical History:  Procedure Laterality Date  . BASAL CELL CARCINOMA EXCISION  2018  . COLONOSCOPY  2001  . HIP SURGERY  2005  . KNEE SURGERY  1998     Home Medications:  Prior to Admission medications   Medication Sig Start Date End Date Taking? Authorizing Provider  aspirin EC 325 MG tablet Take 325 mg by mouth daily.   Yes [provider]  Calcium Carb-Cholecalciferol (CALTRATE 600+D3 PO) Take 1 tablet by mouth 2 (two) times daily.   Yes [provider]  hydrochlorothiazide (HYDRODIURIL) 12.5 MG tablet Take 12.5 mg by mouth daily.   Yes [provider]  levothyroxine (SYNTHROID, LEVOTHROID) 100 MCG tablet Take 100 mcg by mouth daily before breakfast.   Yes [provider]  metoprolol tartrate (LOPRESSOR) 50 MG tablet Take 50 mg by mouth 2 (two) times daily.   Yes [provider]  Multiple Vitamins-Minerals (MULTIVITAMIN ADULTS 50+) TABS Take 1 tablet by mouth daily.   Yes [provider]  simvastatin (ZOCOR) 80 MG  tablet Take 80 mg by mouth every evening.   Yes [provider]    Inpatient Medications: Scheduled Meds: . aspirin EC  325 mg Oral Daily  . atorvastatin  40 mg Oral q1800  . enoxaparin (LOVENOX) injection  40 mg Subcutaneous Q24H  . furosemide  40 mg Intravenous BID  . levothyroxine  100 mcg Oral QAC breakfast  . metoprolol tartrate  50 mg Oral BID   Continuous Infusions:  PRN Meds: acetaminophen **OR**  acetaminophen, ondansetron **OR** ondansetron (ZOFRAN) IV, polyethylene glycol  Allergies:    Allergies  Allergen Reactions  . Adhesive [Tape] Other (See Comments)    Causes BLISTERS    Social History:   Social History   Socioeconomic History  . Marital status: Married    Spouse name: Not on file  . Number of children: Not on file  . Years of education: Not on file  . Highest education level: Not on file  Occupational History  . Not on file  Social Needs  . Financial resource strain: Not on file  . Food insecurity:    Worry: Not on file    Inability: Not on file  . Transportation needs:    Medical: Not on file    Non-medical: Not on file  Tobacco Use  . Smoking status: Former Smoker    Packs/day: 1.00    Years: 45.00    Pack years: 45.00    Types: Cigarettes    Last attempt to quit: 05/23/2009    Years since quitting: 8.7  . Smokeless tobacco: Never Used  Substance and Sexual Activity  . Alcohol use: Not Currently  . Drug use: Not on file  . Sexual activity: Not on file  Lifestyle  . Physical activity:    Days per week: Not on file    Minutes per session: Not on file  . Stress: Not on file  Relationships  . Social connections:    Talks on phone: Not on file    Gets together: Not on file    Attends religious service: Not on file    Active member of club or organization: Not on file    Attends meetings of clubs or organizations: Not on file    Relationship status: Not on file  . Intimate partner violence:    Fear of current or ex partner: Not on file    Emotionally abused: Not on file    Physically abused: Not on file    Forced sexual activity: Not on file  Other Topics Concern  . Not on file  Social History Narrative  . Not on file    Family History:    Family History  Problem Relation Age of Onset  . Heart failure Mother   . Chronic Renal Failure Mother   . Alcohol abuse Father      ROS:  Please see the history of present illness.   All other  ROS reviewed and negative.     Physical Exam/Data:   Vitals:   02/20/18 0042 02/20/18 0526 02/20/18 0829 02/20/18 0955  BP: (!) 121/45 (!) 142/55 (!) 123/41   Pulse: (!) 58 (!) 56 (!) 56 65  Resp: 20 18 18    Temp: 97.6 F (36.4 C) 97.8 F (36.6 C) 98.1 F (36.7 C)   TempSrc: Oral Oral Oral   SpO2: 95% 96% 95%   Weight:  85.7 kg    Height:        Intake/Output Summary (Last 24 hours) at 02/20/2018 1454 Last data  filed at 02/20/2018 1227 Gross per 24 hour  Intake 720 ml  Output 1800 ml  Net -1080 ml   Last 3 Weights 02/20/2018 02/18/2018 02/18/2018  Weight (lbs) 188 lb 14.4 oz 191 lb 9.6 oz 200 lb  Weight (kg) 85.684 kg 86.909 kg 90.719 kg     Body mass index is 35.11 kg/m.  General:  Well nourished, well developed, in no acute distress HEENT: normal Neck: no JVD Vascular: bruits difficult given murmur  Cardiac:  normal S1, S2; RRR; 4/6 systolic murmur Lungs:  clear to auscultation bilaterally, no wheezing, rhonchi or rales, slightly diminished in bases Abd: soft, nontender, no hepatomegaly  Ext: no edema Musculoskeletal:  No deformities, BUE and BLE strength normal and equal Skin: warm and dry  Neuro:  CNs 2-12 intact, no focal abnormalities noted Psych:  Normal affect   EKG:  The EKG was personally reviewed and demonstrates:  Sinus with RBBB (not new) Telemetry:  Telemetry was personally reviewed and demonstrates:  Sinus bradycardia  Relevant CV Studies:  Echo 02/20/18: 1. The left ventricle has hyperdynamic systolic function of >57%. The cavity size was normal. There is mildly increased left ventricular wall thickness. Echo evidence of pseudonormalization in diastolic relaxation Elevated mean left atrial pressure.  2. The right ventricle has normal systolic function. The cavity was normal. There is no increase in right ventricular wall thickness.  3. Left atrial size was severely dilated.  4. The mitral valve is rheumatic. There is moderate thickening and moderate  calcification. There is severe mitral annular calcification present. moderate mitral valve stenosis.  5. The tricuspid valve is normal in structure.  6. The aortic valve is tricuspid There is severe thickening and severe calcifcation of the aortic valve. Aortic valve regurgitation is mild to moderate by color flow Doppler. The calculated aortic valve area is 0.71 cm, consistent with severe stenosis.  7. The inferior vena cava was dilated in size with >50% respiratory variability.  8. AV is thickened, calcified with restricted motion. Peak and mean gradients through the valve are 69 and 44 mm Hg repectively consistent with severe AS.  Laboratory Data:  Chemistry Recent Labs  Lab 02/18/18 1319 02/19/18 0548 02/20/18 0616  NA 136 140 137  K 4.6 3.9 3.6  CL 102 99 97*  CO2 23 26 27   GLUCOSE 119* 89 99  BUN 23 22 23   CREATININE 0.84 1.09* 1.02*  CALCIUM 9.2 8.7* 8.9  GFRNONAA >60 47* 51*  GFRAA >60 55* 59*  ANIONGAP 11 15 13     Recent Labs  Lab 02/20/18 0616  ALBUMIN 3.1*   Hematology Recent Labs  Lab 02/18/18 1319 02/19/18 0548 02/20/18 0616  WBC 11.4* 10.9* 9.6  RBC 3.18* 3.11* 3.25*  HGB 8.8* 8.9* 9.1*  HCT 28.9* 27.6* 29.0*  MCV 90.9 88.7 89.2  MCH 27.7 28.6 28.0  MCHC 30.4 32.2 31.4  RDW 15.0 15.0 14.8  PLT 294 320 329   Cardiac Enzymes Recent Labs  Lab 02/18/18 1822 02/18/18 2341 02/19/18 0548  TROPONINI 0.12* 0.12* 0.10*    Recent Labs  Lab 02/18/18 1322  TROPIPOC 0.04    BNP Recent Labs  Lab 02/18/18 1319  BNP 735.4*    DDimer No results for input(s): DDIMER in the last 168 hours.  Radiology/Studies:  Dg Chest 2 View  Result Date: 02/20/2018 CLINICAL DATA:  Congestive heart failure. EXAM: CHEST - 2 VIEW COMPARISON:  Radiographs of February 18, 2018. FINDINGS: Stable cardiomegaly. Atherosclerosis of thoracic aorta is noted. No pneumothorax is  noted. Minimal pleural effusions are noted. No consolidative process is noted. Bony thorax is  unremarkable. IMPRESSION: Minimal pleural effusions.  No consolidative process is noted. Aortic Atherosclerosis (ICD10-I70.0). Electronically Signed   By: Marijo Conception, M.D.   On: 02/20/2018 09:40   Dg Chest 2 View  Result Date: 02/18/2018 CLINICAL DATA:  Episodes of SOB on exertion for about 2 days, pt also reports she had a episode of non radiating centralized cp along with nausea that lasted around 5 min EXAM: CHEST - 2 VIEW COMPARISON:  None. FINDINGS: The heart is enlarged. There is significant mitral annulus calcification. Pulmonary vascular congestion is present. Patchy opacities are identified in the RIGHT UPPER lobe and RIGHT LOWER lobe. There is an associated RIGHT pleural effusion. L1 compression fracture is chronic. IMPRESSION: 1. Cardiomegaly and pulmonary vascular congestion. 2. RIGHT lung infiltrates and pleural effusion. Electronically Signed   By: Nolon Nations M.D.   On: 02/18/2018 14:18    Assessment and Plan:   1. Chronic diastolic heart failure - EF is preserved at greater than 65% - primary started IV lasix, she reports improvement in symptoms - BNP elevated in the setting of normal creatinine - CXR clear - transition lasix to 40 mg daily, she appears euvolemic on exam - may require BID dosing given her valvular disease - monitor pressures - stop home HCTZ - continue home meds ASA, statin, and lopressor   2. 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 Severe aortic stenosis - likely contributing to her shortness of breath - may consider TAVR (husband had TAVR 3 years ago) - needs to be followed closely   4. Chest discomfort - mildly elevated troponin: 0.12 --> 0.12 --> 0.10 - EKG with old RBBB - troponin elevation may be due to demand ischemia in the setting of new onset heart failure - HPI difficult as patient didn't remember progression of chest pain at first, prompted by her son at bedside - once  she is euvolemic without symptoms, would consider ischemic evaluation given exertional chest pain resolved with rest      For questions or updates, please contact [place] Please consult [email or web address] for contact info under     Signed, Ledora Bottcher, PA  02/20/2018 2:54 PM

## 2018-02-20 NOTE — Progress Notes (Signed)
  Echocardiogram 2D Echocardiogram has been performed.  Julie Mcgee 02/20/2018, 1:14 PM

## 2018-02-20 NOTE — Care Management Note (Signed)
Case Management Note  Patient Details  Name: Julie Mcgee MRN: [id number] Date of Birth: [birth year]  Subjective/Objective:      Pt admitted with CHF. She is from home with spouse. DME: walker, 3 in 1 No issues obtaining her meds. Son provides needed transportation.              Action/Plan: PT recommending [place] services. CM provided choice and [facility] selected. Cassie with [facility] notified and will follow. MD please place [place] orders and F2F.  CM following for further needs.   Expected Discharge Date:                  Expected Discharge Plan:  [place]  In-House Referral:     Discharge planning Services  CM Consult  Post Acute Care Choice:  Home Health Choice offered to:     DME Arranged:    DME Agency:     HH Arranged:    HH Agency:  [facility]  Status of Service:  In process, will continue to follow  If discussed at Long Length of Stay Meetings, dates discussed:    Additional Comments:  Pollie Friar, RN 02/20/2018, 4:22 PM

## 2018-02-20 NOTE — Progress Notes (Signed)
SATURATION QUALIFICATIONS: (This note is used to comply with regulatory documentation for home oxygen)  Patient Saturations on Room Air at Rest = 92%  Patient Saturations on Room Air while Ambulating = 88%  Patient Saturations on 2 Liters of oxygen while Ambulating = 94%  Please briefly explain why patient needs home oxygen:Pt did not desat below 88% on RA with activity.  Will follow acutely. Thank.s  [place] Pager:  [phone]  Office:  [phone]

## 2018-02-20 NOTE — Evaluation (Signed)
Occupational Therapy Evaluation Patient Details Name: Julie Mcgee MRN: [id number] DOB: 09-15-1933 Today's Date: 02/20/2018    History of Present Illness Elene Downum is an 85 year old Caucasian female, with past medical history significant for carotid artery disease, CVA, hyperlipidemia, hypertension, PVD, hyponatremia, hypothyroidism and aortic stenosis as per scanned echocardiogram done November 2015.  Patient was admitted with dyspnea on exertion, fatigue and shortness of breath.     Clinical Impression   Pt with decline in function and safety with ADLs and ADL mobility with decreased strength, balance and endurance. Pt on RA to during in room activity in bathroom and at sink. Pt did not demo any SOB. Pt would benefit from acute OT services to address impairments to maximize level of function and safety    Follow Up Recommendations  No OT follow up;Supervision - Intermittent    Equipment Recommendations  None recommended by OT    Recommendations for Other Services       Precautions / Restrictions Precautions Precautions: Fall Restrictions Weight Bearing Restrictions: No      Mobility Bed Mobility               General bed mobility comments: Pt in chair upon arrival  Transfers Overall transfer level: Needs assistance Equipment used: Rolling walker (2 wheeled) Transfers: Sit to/from Stand Sit to Stand: Min guard         General transfer comment: needed steadying assist fro balance    Balance Overall balance assessment: Needs assistance Sitting-balance support: No upper extremity supported;Feet supported Sitting balance-Leahy Scale: Fair     Standing balance support: Bilateral upper extremity supported;During functional activity Standing balance-Leahy Scale: Poor Standing balance comment: relieso n UE support.                            ADL either performed or assessed with clinical judgement   ADL Overall ADL's : Needs  assistance/impaired     Grooming: Wash/dry hands;Wash/dry face;Supervision/safety;Standing   Upper Body Bathing: Supervision/ safety;Sitting   Lower Body Bathing: Min guard   Upper Body Dressing : Supervision/safety;Sitting   Lower Body Dressing: Min guard   Toilet Transfer: Min guard;Ambulation;RW;Grab bars;Cueing for safety   Toileting- Clothing Manipulation and Hygiene: Supervision/safety;Sit to/from stand   Tub/ Shower Transfer: Min guard;Ambulation;Rolling walker;Grab bars;3 in 1;Cueing for safety   Functional mobility during ADLs: Min guard;Cueing for safety General ADL Comments: min guard A for LB ADLs due to Poor standing balance. Reviewed energy conservation with pt from handout provided by PT     Vision Patient Visual Report: No change from baseline       Perception     Praxis      Pertinent Vitals/Pain Pain Assessment: No/denies pain     Hand Dominance Right   Extremity/Trunk Assessment Upper Extremity Assessment Upper Extremity Assessment: Generalized weakness   Lower Extremity Assessment Lower Extremity Assessment: Defer to PT evaluation   Cervical / Trunk Assessment Cervical / Trunk Assessment: Normal   Communication Communication Communication: No difficulties   Cognition Arousal/Alertness: Awake/alert Behavior During Therapy: WFL for tasks assessed/performed Overall Cognitive Status: Within Functional Limits for tasks assessed                                     General Comments       Exercises Exercises: General Lower Extremity General Exercises - Lower Extremity Ankle Circles/Pumps: AROM;Both;10 reps;Seated Long Arc  Quad: AROM;Both;10 reps;Seated   Shoulder Instructions      Home Living Family/patient expects to be discharged to:: Private residence Living Arrangements: Spouse/significant other Available Help at Discharge: Family;Available 24 hours/day Type of Home: Independent living facility Home Access: Level  entry     Home Layout: One level     Bathroom Shower/Tub: Occupational psychologist: Standard     Home Equipment: Environmental consultant - 2 wheels;Walker - 4 wheels;Cane - single point;Bedside commode;Grab bars - tub/shower          Prior Functioning/Environment Level of Independence: Independent with assistive device(s)        Comments: Pt used RW outdoors and rollator indoors        OT Problem List: Decreased activity tolerance;Impaired balance (sitting and/or standing)      OT Treatment/Interventions:      OT Goals(Current goals can be found in the care plan section) Acute Rehab OT Goals Patient Stated Goal: to go home OT Goal Formulation: With patient/family ADL Goals Pt Will Perform Grooming: with supervision;with set-up;standing Pt Will Perform Lower Body Bathing: with supervision;with set-up;with modified independence Pt Will Perform Lower Body Dressing: with supervision;with set-up;with modified independence Pt Will Transfer to Toilet: with supervision;with modified independence;ambulating Additional ADL Goal #1: Pt will verbalize and demo 3 energy conservation techniques during ADLs and ADL mobility  OT Frequency:     Barriers to D/C:    no barriers       Co-evaluation              AM-PAC OT "6 Clicks" Daily Activity     Outcome Measure Help from another person eating meals?: None Help from another person taking care of personal grooming?: None Help from another person toileting, which includes using toliet, bedpan, or urinal?: None Help from another person bathing (including washing, rinsing, drying)?: A Little   Help from another person to put on and taking off regular lower body clothing?: A Little 6 Click Score: 18   End of Session Equipment Utilized During Treatment: Gait belt;Rolling walker;Other (comment)(3 in 1)  Activity Tolerance: Patient tolerated treatment well Patient left: in chair;with call bell/phone within reach;with family/visitor  present  OT Visit Diagnosis: Muscle weakness (generalized) (M62.81);Unsteadiness on feet (R26.81)                Time: 4098-1191 OT Time Calculation (min): 24 min Charges:  OT General Charges $OT Visit: 1 Visit OT Evaluation $OT Eval Moderate Complexity: 1 Mod OT Treatments $Self Care/Home Management : 8-22 mins   Britt Bottom 02/20/2018, 1:58 PM

## 2018-02-20 NOTE — Evaluation (Signed)
Physical Therapy Evaluation Patient Details Name: Julie Mcgee MRN: [id number] DOB: 31-May-1933 Today's Date: 02/20/2018   History of Present Illness  Julie Mcgee is an 85 year old Caucasian female, with past medical history significant for carotid artery disease, CVA, hyperlipidemia, hypertension, PVD, hyponatremia, hypothyroidism and aortic stenosis as per scanned echocardiogram done November 2015.  Patient was admitted with dyspnea on exertion, fatigue and shortness of breath.    Clinical Impression  Pt admitted with above diagnosis. Pt currently with functional limitations due to the deficits listed below (see PT Problem List). Pt was able to ambulate with min guard assist with RW.  Pt did not desat below 88% however does have DOE 3/4 and needing pursed lip breathing cues and Energy conservation techniques.  Will follow acutely.  Pt will benefit from skilled PT to increase their independence and safety with mobility to allow discharge to the venue listed below.    SATURATION QUALIFICATIONS: (This note is used to comply with regulatory documentation for home oxygen)  Patient Saturations on Room Air at Rest = 92%  Patient Saturations on Room Air while Ambulating = 88%  Patient Saturations on 2 Liters of oxygen while Ambulating = 94%  Please briefly explain why patient needs home oxygen:Pt did not desat below 88% on RA with activity.  Follow Up Recommendations Home health PT;Supervision/Assistance - 24 hour    Equipment Recommendations  Other (comment)(possibly O2)    Recommendations for Other Services       Precautions / Restrictions Precautions Precautions: Fall Restrictions Weight Bearing Restrictions: No      Mobility  Bed Mobility               General bed mobility comments: Pt in chair  Transfers Overall transfer level: Needs assistance Equipment used: Rolling walker (2 wheeled) Transfers: Sit to/from Stand Sit to Stand: Min guard         General  transfer comment: needed steadying assist fro balance  Ambulation/Gait Ambulation/Gait assistance: Min guard Gait Distance (Feet): 150 Feet Assistive device: Rolling walker (2 wheeled) Gait Pattern/deviations: Step-through pattern;Decreased stride length;Trunk flexed   Gait velocity interpretation: <1.31 ft/sec, indicative of household ambulator General Gait Details: Pt was able to ambulate with min guard assist wtih RW with overall good stability.  Pt sats 88% and greater. Needed pursed lip breathing as she did desat.    Stairs            Wheelchair Mobility    Modified Rankin (Stroke Patients Only)       Balance Overall balance assessment: Needs assistance Sitting-balance support: No upper extremity supported;Feet supported Sitting balance-Leahy Scale: Fair     Standing balance support: Bilateral upper extremity supported;During functional activity Standing balance-Leahy Scale: Poor Standing balance comment: relieso n UE support.                              Pertinent Vitals/Pain Pain Assessment: No/denies pain    Home Living Family/patient expects to be discharged to:: Private residence Living Arrangements: Spouse/significant other Available Help at Discharge: Family;Available 24 hours/day(husbnad with pt and children check in intermittently daily) Type of Home: Independent living facility Home Access: Level entry     Home Layout: One level Home Equipment: [place] - 2 wheels;Walker - 4 wheels;Cane - single point;Bedside commode;Grab bars - tub/shower      Prior Function Level of Independence: Independent with assistive device(s)         Comments: Pt used RW outdoors and  rollator indoors     Hand Dominance        Extremity/Trunk Assessment   Upper Extremity Assessment Upper Extremity Assessment: Defer to OT evaluation    Lower Extremity Assessment Lower Extremity Assessment: Generalized weakness    Cervical / Trunk  Assessment Cervical / Trunk Assessment: Normal  Communication   Communication: No difficulties  Cognition Arousal/Alertness: Awake/alert Behavior During Therapy: WFL for tasks assessed/performed Overall Cognitive Status: Within Functional Limits for tasks assessed                                        General Comments      Exercises General Exercises - Lower Extremity Ankle Circles/Pumps: AROM;Both;10 reps;Seated Long Arc Quad: AROM;Both;10 reps;Seated   Assessment/Plan    PT Assessment Patient needs continued PT services  PT Problem List Decreased activity tolerance;Decreased balance;Decreased mobility;Decreased knowledge of use of DME;Decreased safety awareness;Decreased knowledge of precautions;Cardiopulmonary status limiting activity       PT Treatment Interventions DME instruction;Gait training;Functional mobility training;Therapeutic activities;Therapeutic exercise;Balance training;Patient/family education    PT Goals (Current goals can be found in the Care Plan section)  Acute Rehab PT Goals Patient Stated Goal: to go home PT Goal Formulation: With patient Time For Goal Achievement: 03/06/18 Potential to Achieve Goals: Good    Frequency Min 3X/week   Barriers to discharge        Co-evaluation               AM-PAC PT "6 Clicks" Mobility  Outcome Measure Help needed turning from your back to your side while in a flat bed without using bedrails?: A Little Help needed moving from lying on your back to sitting on the side of a flat bed without using bedrails?: A Little Help needed moving to and from a bed to a chair (including a wheelchair)?: A Little Help needed standing up from a chair using your arms (e.g., wheelchair or bedside chair)?: A Little Help needed to walk in hospital room?: A Little Help needed climbing 3-5 steps with a railing? : A Little 6 Click Score: 18    End of Session Equipment Utilized During Treatment: Gait  belt Activity Tolerance: Patient limited by fatigue Patient left: in chair;with call bell/phone within reach;with family/visitor present Nurse Communication: Mobility status PT Visit Diagnosis: Muscle weakness (generalized) (M62.81)    Time: 1030-1100 PT Time Calculation (min) (ACUTE ONLY): 30 min   Charges:   PT Evaluation $PT Eval Moderate Complexity: 1 Mod PT Treatments $Gait Training: 8-22 mins        [place] Pager:  [phone]  Office:  [place] 02/20/2018, 1:18 PM

## 2018-02-20 NOTE — Progress Notes (Signed)
PROGRESS NOTE    Julie Mcgee  PJA:[id number] DOB: 03/16/1933 DOA: 02/18/2018 PCP: Glenford Bayley, DO  Outpatient Specialists:   Brief Narrative:  Julie Mcgee is an 85 year old Caucasian female, with past medical history significant for carotid artery disease, CVA, hyperlipidemia, hypertension, PVD, hyponatremia, hypothyroidism and aortic stenosis as per scanned echocardiogram done November 2015.  No recent echocardiogram visualized.  Patient has had prior syncopal episode.  Patient was admitted with dyspnea on exertion, fatigue and shortness of breath.  Patient started noticing worsening shortness of breath while walking back and forth from her bedroom to the living room about 2 days prior to presentation.  Typically, patient was fairly active and did not have difficulty with breathing.  Cardiac BNP was elevated at 735.  Troponin was 0.12 on presentation, but has remained fairly stable.  Chest x-ray done revealed cardiomegaly and pulmonary vascular congestion.  Patient is currently on IV diuretics, and seems to be improving.  Echocardiogram is pending.  Will have a low threshold to consult cardiology team.  02/20/2018: Patient seen alongside patient's husband and son.  Cardiology input is highly appreciated.  For likely cardiac catheterization in the morning.  The plan is to eventually proceed to [place].  Echocardiogram revealed severe aortic stenosis.   Assessment & Plan:   Active Problems:   Hypertension   Hyperlipidemia   Hypothyroidism   Moderate to severe aortic stenosis   History of stroke   CHF (congestive heart failure) (HCC)   Dyspnea on exertion   Leukocytosis  Dyspnea on exertion x2 days, most concerning for new onset CHF, stable.  Given elevated BNP, vascular congestion and cardiomegaly, peripheral edema and positive JVD I think this is most consistent with new onset CHF.  We will follow-up with TTE for better evaluation.  Continue IV Lasix, strict I's and O's, daily weights.   She  also has acute on chronic anemia which could contribute to shortness of breath but given above symptoms think CHF is most likely. 02/19/2018: Patient is improving.  Continue IV diuretics.  Pursue echocardiogram.   Right lung infiltrates and pleural effusion. I think infection is less likely given she is afebrile, no productive cough, no risk factors, though does have nonspecific slight white count, follow-up procalcitonin.  Pleural effusion likely related to presumed CHF.  Currently not requiring any supplemental oxygen. 02/19/2018: Repeat chest x-ray in the morning.  Acute on chronic anemia.  Previous baseline hemoglobin of 11 on 11/2017.  8.8 on admission.  Denies any melena, hematuria or other signs of bleeding.  Follow daily CBC. 02/19/2018: Hemoglobin is 8.9 g/dL today.  Hemoglobin is currently stable.  Chest pain, atypical, currently resolved.  Occurring at rest, described as achy, and initial troponin negative.  Do not suspect cardiac etiology, nonischemic EKG.  Continue tropoin trend. Will be able to assess for wall motion abnormalities on TTE. 02/19/2018: Chest pain has resolved. 02/20/2018: Repeat chest x-ray result is noted.  Leukocytosis.  Slight elevation in WBC to 11.  Patient afebrile with no localizing signs or symptoms of infection.   Blood cultures if spikes temperature hold off on antibiotics for now as infection less likely as mentioned above. 02/19/2018: Leukocytosis is resolving.  Her calcitonin is less than 0.1.  Moderate to severe aortic stenosis: -Followed by cardiology as outpatient (Dr.Renvankar, [facility]).  Evaluate on TTE. -02/20/2018: Patient seen alongside patient's husband and son.  Cardiology input is highly appreciated.  For likely cardiac catheterization in the morning.  The plan is to eventually  proceed to [place].  Echocardiogram revealed severe aortic stenosis.   Hypothyroidism: -TSH was 3.355. -Continue Synthroid.  Hyperlipidemia, stable.   Continue statin  Hypertension, stable continue metoprolol tartrate.  Hold home HCTZ while on IV Lasix.  History of stroke, with right hand weakness and some dysarthria, stable.  Continue full dose aspirin.  Was previously on warfarin that was discontinued several months ago by cardiologist  Consultants: None. Code Status: DNR. DVT Prophylaxis:lovenox Family Communication:  Daughter-in-law Disposition Plan:  Likely DC home.     Procedures:   None  Antimicrobials:   None   Subjective: Patient is sedated and intubated.  Objective: Vitals:   02/20/18 0042 02/20/18 0526 02/20/18 0829 02/20/18 0955  BP: (!) 121/45 (!) 142/55 (!) 123/41   Pulse: (!) 58 (!) 56 (!) 56 65  Resp: 20 18 18    Temp: 97.6 F (36.4 C) 97.8 F (36.6 C) 98.1 F (36.7 C)   TempSrc: Oral Oral Oral   SpO2: 95% 96% 95%   Weight:  85.7 kg    Height:        Intake/Output Summary (Last 24 hours) at 02/20/2018 1621 Last data filed at 02/20/2018 1606 Gross per 24 hour  Intake 720 ml  Output 1850 ml  Net -1130 ml   Filed Weights   02/18/18 1256 02/18/18 1828 02/20/18 0526  Weight: 90.7 kg 86.9 kg 85.7 kg    Examination:  General exam: Patient is moderately obese.  Patient is sedated and intubated.  Respiratory system: Clear to auscultation.  Cardiovascular system: S1 & S2 Gastrointestinal system: Abdomen is morbidly obese, soft and nontender.  Organs difficult to assess. Central nervous system: Sedated and intubated. Extremities: Mild bilateral lower leg edema.  Left lower extremity is wrapped.  Data Reviewed: I have personally reviewed following labs and imaging studies  CBC: Recent Labs  Lab 02/18/18 1319 02/19/18 0548 02/20/18 0616  WBC 11.4* 10.9* 9.6  NEUTROABS 9.8*  --  6.0  HGB 8.8* 8.9* 9.1*  HCT 28.9* 27.6* 29.0*  MCV 90.9 88.7 89.2  PLT 294 320 169   Basic Metabolic Panel: Recent Labs  Lab 02/18/18 1319 02/19/18 0548 02/20/18 0616  NA 136 140 137  K 4.6 3.9 3.6  CL  102 99 97*  CO2 23 26 27   GLUCOSE 119* 89 99  BUN 23 22 23   CREATININE 0.84 1.09* 1.02*  CALCIUM 9.2 8.7* 8.9  MG  --   --  2.0  PHOS  --   --  4.1   GFR: Estimated Creatinine Clearance: 42.8 mL/min (A) (by C-G formula based on SCr of 1.02 mg/dL (H)). Liver Function Tests: Recent Labs  Lab 02/20/18 0616  ALBUMIN 3.1*   No results for input(s): LIPASE, AMYLASE in the last 168 hours. No results for input(s): AMMONIA in the last 168 hours. Coagulation Profile: Recent Labs  Lab 02/18/18 1319  INR 1.13   Cardiac Enzymes: Recent Labs  Lab 02/18/18 1822 02/18/18 2341 02/19/18 0548  TROPONINI 0.12* 0.12* 0.10*   BNP (last 3 results) No results for input(s): PROBNP in the last 8760 hours. HbA1C: No results for input(s): HGBA1C in the last 72 hours. CBG: No results for input(s): GLUCAP in the last 168 hours. Lipid Profile: No results for input(s): CHOL, HDL, LDLCALC, TRIG, CHOLHDL, LDLDIRECT in the last 72 hours. Thyroid Function Tests: Recent Labs    02/18/18 1822  TSH 3.355   Anemia Panel: No results for input(s): VITAMINB12, FOLATE, FERRITIN, TIBC, IRON, RETICCTPCT in the last 72 hours. Urine  analysis:    Component Value Date/Time   COLORURINE YELLOW 02/18/2018 1544   APPEARANCEUR HAZY (A) 02/18/2018 1544   LABSPEC 1.019 02/18/2018 1544   PHURINE 5.0 02/18/2018 1544   GLUCOSEU NEGATIVE 02/18/2018 1544   HGBUR NEGATIVE 02/18/2018 [place] 02/18/2018 1544   KETONESUR NEGATIVE 02/18/2018 1544   PROTEINUR NEGATIVE 02/18/2018 1544   NITRITE NEGATIVE 02/18/2018 1544   LEUKOCYTESUR MODERATE (A) 02/18/2018 1544   Sepsis Labs: @LABRCNTIP (procalcitonin:4,lacticidven:4)  )No results found for this or any previous visit (from the past 240 hour(s)).       Radiology Studies: Dg Chest 2 View  Result Date: 02/20/2018 CLINICAL DATA:  Congestive heart failure. EXAM: CHEST - 2 VIEW COMPARISON:  Radiographs of February 18, 2018. FINDINGS: Stable  cardiomegaly. Atherosclerosis of thoracic aorta is noted. No pneumothorax is noted. Minimal pleural effusions are noted. No consolidative process is noted. Bony thorax is unremarkable. IMPRESSION: Minimal pleural effusions.  No consolidative process is noted. Aortic Atherosclerosis (ICD10-I70.0). Electronically Signed   By: Marijo Conception, M.D.   On: 02/20/2018 09:40    Scheduled Meds: . aspirin EC  325 mg Oral Daily  . atorvastatin  40 mg Oral q1800  . enoxaparin (LOVENOX) injection  40 mg Subcutaneous Q24H  . furosemide  40 mg Intravenous BID  . levothyroxine  100 mcg Oral QAC breakfast  . metoprolol tartrate  50 mg Oral BID   Continuous Infusions:   LOS: 2 days    Time spent: 7 Minutes   Dana Allan, MD  [facility] Pager #: [phone] 7PM-7AM contact night coverage as above

## 2018-02-21 ENCOUNTER — Inpatient Hospital Stay (HOSPITAL_COMMUNITY): Payer: Medicare Other

## 2018-02-21 ENCOUNTER — Other Ambulatory Visit: Payer: Self-pay

## 2018-02-21 ENCOUNTER — Encounter (HOSPITAL_COMMUNITY)
Admission: EM | Disposition: A | Discharge status: Home Health | Payer: Self-pay | Source: Home / Self Care | Attending: Internal Medicine

## 2018-02-21 ENCOUNTER — Encounter (HOSPITAL_COMMUNITY): Payer: Self-pay | Admitting: Physician Assistant

## 2018-02-21 DIAGNOSIS — I2510 Atherosclerotic heart disease of native coronary artery without angina pectoris: Secondary | ICD-10-CM

## 2018-02-21 DIAGNOSIS — R0989 Other specified symptoms and signs involving the circulatory and respiratory systems: Secondary | ICD-10-CM

## 2018-02-21 DIAGNOSIS — Z8673 Personal history of transient ischemic attack (TIA), and cerebral infarction without residual deficits: Secondary | ICD-10-CM

## 2018-02-21 DIAGNOSIS — I5033 Acute on chronic diastolic (congestive) heart failure: Secondary | ICD-10-CM

## 2018-02-21 DIAGNOSIS — I350 Nonrheumatic aortic (valve) stenosis: Secondary | ICD-10-CM

## 2018-02-21 HISTORY — PX: RIGHT/LEFT HEART CATH AND CORONARY ANGIOGRAPHY: CATH118266

## 2018-02-21 LAB — POCT I-STAT EG7
Acid-Base Excess: 5 mmol/L — ABNORMAL HIGH (ref 0.0–2.0)
Bicarbonate: 29 mmol/L — ABNORMAL HIGH (ref 20.0–28.0)
Calcium, Ion: 1.19 mmol/L (ref 1.15–1.40)
HCT: 28 % — ABNORMAL LOW (ref 36.0–46.0)
Hemoglobin: 9.5 g/dL — ABNORMAL LOW (ref 12.0–15.0)
O2 Saturation: 63 %
Potassium: 3.5 mmol/L (ref 3.5–5.1)
Sodium: 136 mmol/L (ref 135–145)
TCO2: 30 mmol/L (ref 22–32)
pCO2, Ven: 41.4 mmHg — ABNORMAL LOW (ref 44.0–60.0)
pH, Ven: 7.454 — ABNORMAL HIGH (ref 7.250–7.430)
pO2, Ven: 31 mmHg — CL (ref 32.0–45.0)

## 2018-02-21 LAB — POCT I-STAT 7, (LYTES, BLD GAS, ICA,H+H)
Acid-Base Excess: 4 mmol/L — ABNORMAL HIGH (ref 0.0–2.0)
Bicarbonate: 27.8 mmol/L (ref 20.0–28.0)
Calcium, Ion: 1.17 mmol/L (ref 1.15–1.40)
HCT: 28 % — ABNORMAL LOW (ref 36.0–46.0)
Hemoglobin: 9.5 g/dL — ABNORMAL LOW (ref 12.0–15.0)
O2 Saturation: 96 %
Potassium: 3.4 mmol/L — ABNORMAL LOW (ref 3.5–5.1)
Sodium: 136 mmol/L (ref 135–145)
TCO2: 29 mmol/L (ref 22–32)
pCO2 arterial: 37.1 mmHg (ref 32.0–48.0)
pH, Arterial: 7.483 — ABNORMAL HIGH (ref 7.350–7.450)
pO2, Arterial: 74 mmHg — ABNORMAL LOW (ref 83.0–108.0)

## 2018-02-21 SURGERY — RIGHT/LEFT HEART CATH AND CORONARY ANGIOGRAPHY
Anesthesia: LOCAL

## 2018-02-21 MED ORDER — LIDOCAINE HCL (PF) 1 % IJ SOLN
INTRAMUSCULAR | Status: DC | PRN
  Administered 2018-02-21 (×2): 2 mL via SUBCUTANEOUS

## 2018-02-21 MED ORDER — HEPARIN SODIUM (PORCINE) 1000 UNIT/ML IJ SOLN
INTRAMUSCULAR | Status: DC | PRN
  Administered 2018-02-21: 4000 [IU] via INTRAVENOUS

## 2018-02-21 MED ORDER — FENTANYL CITRATE (PF) 100 MCG/2ML IJ SOLN
INTRAMUSCULAR | Status: DC | PRN
  Administered 2018-02-21: 25 ug via INTRAVENOUS

## 2018-02-21 MED ORDER — HEPARIN (PORCINE) IN NACL 1000-0.9 UT/500ML-% IV SOLN
INTRAVENOUS | Status: DC | PRN
  Administered 2018-02-21 (×2): 500 mL

## 2018-02-21 MED ORDER — HEPARIN SODIUM (PORCINE) 1000 UNIT/ML IJ SOLN
INTRAMUSCULAR | Status: AC
  Filled 2018-02-21: qty 1

## 2018-02-21 MED ORDER — FUROSEMIDE 40 MG PO TABS
40.0000 mg | ORAL_TABLET | Freq: Every day | ORAL | Status: DC
  Administered 2018-02-22: 40 mg via ORAL
  Filled 2018-02-21: qty 1

## 2018-02-21 MED ORDER — FENTANYL CITRATE (PF) 100 MCG/2ML IJ SOLN
INTRAMUSCULAR | Status: AC
  Filled 2018-02-21: qty 2

## 2018-02-21 MED ORDER — IOHEXOL 350 MG/ML SOLN
INTRAVENOUS | Status: DC | PRN
  Administered 2018-02-21: 30 mL via INTRA_ARTERIAL

## 2018-02-21 MED ORDER — VERAPAMIL HCL 2.5 MG/ML IV SOLN
INTRAVENOUS | Status: DC | PRN
  Administered 2018-02-21: 10 mL via INTRA_ARTERIAL

## 2018-02-21 MED ORDER — VERAPAMIL HCL 2.5 MG/ML IV SOLN
INTRAVENOUS | Status: AC
  Filled 2018-02-21: qty 2

## 2018-02-21 MED ORDER — HEPARIN (PORCINE) IN NACL 1000-0.9 UT/500ML-% IV SOLN
INTRAVENOUS | Status: AC
  Filled 2018-02-21: qty 1000

## 2018-02-21 MED ORDER — SODIUM CHLORIDE 0.9% FLUSH: 3.0000 mL | INTRAVENOUS | Status: DC | PRN

## 2018-02-21 MED ORDER — LIDOCAINE HCL (PF) 1 % IJ SOLN
INTRAMUSCULAR | Status: AC
  Filled 2018-02-21: qty 30

## 2018-02-21 MED ORDER — MIDAZOLAM HCL 2 MG/2ML IJ SOLN
INTRAMUSCULAR | Status: AC
  Filled 2018-02-21: qty 2

## 2018-02-21 MED ORDER — SODIUM CHLORIDE 0.9% FLUSH
3.0000 mL | Freq: Two times a day (BID) | INTRAVENOUS | Status: DC
  Administered 2018-02-21 – 2018-02-22 (×3): 3 mL via INTRAVENOUS

## 2018-02-21 MED ORDER — SODIUM CHLORIDE 0.9 % IV SOLN: 250.0000 mL | INTRAVENOUS | Status: DC | PRN

## 2018-02-21 MED ORDER — MIDAZOLAM HCL 2 MG/2ML IJ SOLN
INTRAMUSCULAR | Status: DC | PRN
  Administered 2018-02-21 (×2): 1 mg via INTRAVENOUS

## 2018-02-21 MED ORDER — SODIUM CHLORIDE 0.9 % IV SOLN: INTRAVENOUS | Status: DC

## 2018-02-21 SURGICAL SUPPLY — 12 items

## 2018-02-21 NOTE — Care Management Important Message (Signed)
Important Message  Patient Details  Name: Julie Mcgee MRN: [id number] Date of Birth: May 26, 1933   Medicare Important Message Given:  Yes    Angeline Trick P Jayshon Dommer 02/21/2018, 4:40 PM

## 2018-02-21 NOTE — Interval H&P Note (Signed)
History and Physical Interval Note:  02/21/2018 8:00 AM  Julie Mcgee  has presented today for surgery, with the diagnosis of as  The various methods of treatment have been discussed with the patient and family. After consideration of risks, benefits and other options for treatment, the patient has consented to  Procedure(s): RIGHT/LEFT HEART CATH AND CORONARY ANGIOGRAPHY (N/A) as a surgical intervention .  The patient's history has been reviewed, patient examined, no change in status, stable for surgery.  I have reviewed the patient's chart and labs.  Questions were answered to the patient's satisfaction.     Sherren Mocha

## 2018-02-21 NOTE — Progress Notes (Signed)
PROGRESS NOTE    Julie Mcgee  UEA:[id number] DOB: May 12, 1933 DOA: 02/18/2018 PCP: Glenford Bayley, DO  Outpatient Specialists:   Brief Narrative:  Julie Mcgee is an 85 year old Caucasian female, with past medical history significant for carotid artery disease, CVA, hyperlipidemia, hypertension, PVD, hyponatremia, hypothyroidism and aortic stenosis as per scanned echocardiogram done November 2015.  No recent echocardiogram visualized.  Patient has had prior syncopal episode.  Patient was admitted with dyspnea on exertion, fatigue and shortness of breath.  Patient started noticing worsening shortness of breath while walking back and forth from her bedroom to the living room about 2 days prior to presentation.  Typically, patient was fairly active and did not have difficulty with breathing.  Cardiac BNP was elevated at 735.  Troponin was 0.12 on presentation, but has remained fairly stable.  Chest x-ray done revealed cardiomegaly and pulmonary vascular congestion.  Patient is currently on IV diuretics, and seems to be improving.  Echocardiogram is pending.  Will have a low threshold to consult cardiology team.  02/21/2018: Echocardiogram revealed severe aortic stenosis.  Patient underwent right and left cardiac catheterization today.  Cardiac cath findings revealed "Mild to moderate focal proximal LAD stenosis at the origin of the first diagonal branch, does not appear flow obstructive, severe calcific aortic stenosis with a peak to peak gradient of 60 mmHg, mean gradient 48 mmHg, and calculated valve area 0.96 cm, severe mitral annular calcification biplane fluoroscopy with moderate mitral stenosis present, mean gradient 10 mmHg, calculated valve area 1.7 cm, normal LVEDP, mild pulmonary hypertension, and mildly elevated right heart pressures".  We will continue medical therapy for nonobstructive coronary artery disease.  Patient is being evaluated for possible TAVR.  Cardiology is directing care.   Cardiothoracic team will also be consulted.  Assessment & Plan:   Active Problems:   Hypertension   Hyperlipidemia   Hypothyroidism   Severe aortic stenosis   History of stroke   CHF (congestive heart failure) (HCC)   Leukocytosis  Severe aortic stenosis:  Patient underwent left and right cardiac catheterization earlier today. TAVR is being considered. Patient is currently being worked up for possible TAVR. Cardiothoracic team will be consulted. Further management depend on hospital course.  Acute on chronic diastolic congestive heart failure: -Symptoms are improving with diuresis.   -Continue current management.  -Echocardiogram reveals EF of greater than 65%, severely dilated left atrium, rheumatic mitral valve, moderate mitral valve stenosis, severe aortic stenosis. -Cardiology is directing care.  Acute on chronic anemia: Previous baseline hemoglobin of 11 as per deciliter on 11/2017.   Hemoglobin was 8.8 g/dL on admission.   To monitor CBC for stability.  Chest pain: Deemed atypical. Currently resolved. Negative troponin and nonischemic EKG. Continue to monitor closely.  Hypothyroidism: -TSH was 3.355. -Continue Synthroid.  Hyperlipidemia: Stable. Continue statins.  Hypertension: Stable  Continue to monitor. Patient is currently on metoprolol 50 mg p.o. twice daily and Lasix 40 mg p.o. once daily.    History of stroke, with right hand weakness and some dysarthria: Stable.    Continue full dose aspirin.   Patient was previously on warfarin that was discontinued several months ago by cardiologist  Consultants: None. Code Status: DNR. DVT Prophylaxis:lovenox Family Communication:  Daughter-in-law Disposition Plan:  Likely DC home.     Procedures:   None  Antimicrobials:   None   Subjective: Patient seen. No shortness of breath No chest pain  Objective: Vitals:   02/21/18 0911 02/21/18 1032 02/21/18 1131 02/21/18 1149  BP: 113/64  [name])  113/54 (!) 119/52 124/60  Pulse:      Resp:      Temp:    97.8 F (36.6 C)  TempSrc:    Oral  SpO2:    96%  Weight:      Height:        Intake/Output Summary (Last 24 hours) at 02/21/2018 1601 Last data filed at 02/21/2018 1131 Gross per 24 hour  Intake 220 ml  Output 1650 ml  Net -1430 ml   Filed Weights   02/18/18 1828 02/20/18 0526 02/21/18 0250  Weight: 86.9 kg 85.7 kg 84.5 kg    Examination:  General exam: Patient is not in any distress.  Respiratory system: Clear to auscultation.  Cardiovascular system: S1 & S2 Gastrointestinal system: Abdomen is morbidly obese, soft and nontender.  Organs difficult to assess. Central nervous system: Awake and alert.  Patient moves all limbs.  Data Reviewed: I have personally reviewed following labs and imaging studies  CBC: Recent Labs  Lab 02/18/18 1319 02/19/18 0548 02/20/18 0616 02/21/18 0817 02/21/18 0818  WBC 11.4* 10.9* 9.6  --   --   NEUTROABS 9.8*  --  6.0  --   --   HGB 8.8* 8.9* 9.1* 9.5* 9.5*  HCT 28.9* 27.6* 29.0* 28.0* 28.0*  MCV 90.9 88.7 89.2  --   --   PLT 294 320 329  --   --    Basic Metabolic Panel: Recent Labs  Lab 02/18/18 1319 02/19/18 0548 02/20/18 0616 02/21/18 0817 02/21/18 0818  NA 136 140 137 136 136  K 4.6 3.9 3.6 3.5 3.4*  CL 102 99 97*  --   --   CO2 23 26 27   --   --   GLUCOSE 119* 89 99  --   --   BUN 23 22 23   --   --   CREATININE 0.84 1.09* 1.02*  --   --   CALCIUM 9.2 8.7* 8.9  --   --   MG  --   --  2.0  --   --   PHOS  --   --  4.1  --   --    GFR: Estimated Creatinine Clearance: 42.4 mL/min (A) (by C-G formula based on SCr of 1.02 mg/dL (H)). Liver Function Tests: Recent Labs  Lab 02/20/18 0616  ALBUMIN 3.1*   No results for input(s): LIPASE, AMYLASE in the last 168 hours. No results for input(s): AMMONIA in the last 168 hours. Coagulation Profile: Recent Labs  Lab 02/18/18 1319  INR 1.13   Cardiac Enzymes: Recent Labs  Lab 02/18/18 1822 02/18/18 2341  02/19/18 0548  TROPONINI 0.12* 0.12* 0.10*   BNP (last 3 results) No results for input(s): PROBNP in the last 8760 hours. HbA1C: No results for input(s): HGBA1C in the last 72 hours. CBG: No results for input(s): GLUCAP in the last 168 hours. Lipid Profile: No results for input(s): CHOL, HDL, LDLCALC, TRIG, CHOLHDL, LDLDIRECT in the last 72 hours. Thyroid Function Tests: Recent Labs    02/18/18 1822  TSH 3.355   Anemia Panel: No results for input(s): VITAMINB12, FOLATE, FERRITIN, TIBC, IRON, RETICCTPCT in the last 72 hours. Urine analysis:    Component Value Date/Time   COLORURINE YELLOW 02/18/2018 1544   APPEARANCEUR HAZY (A) 02/18/2018 1544   LABSPEC 1.019 02/18/2018 1544   PHURINE 5.0 02/18/2018 [place] 02/18/2018 [place] 02/18/2018 [place] 02/18/2018 1544   [place] 02/18/2018 1544  PROTEINUR NEGATIVE 02/18/2018 1544   NITRITE NEGATIVE 02/18/2018 1544   LEUKOCYTESUR MODERATE (A) 02/18/2018 1544   Sepsis Labs: @LABRCNTIP (procalcitonin:4,lacticidven:4)  )No results found for this or any previous visit (from the past 240 hour(s)).       Radiology Studies: Dg Chest 2 View  Result Date: 02/20/2018 CLINICAL DATA:  Congestive heart failure. EXAM: CHEST - 2 VIEW COMPARISON:  Radiographs of February 18, 2018. FINDINGS: Stable cardiomegaly. Atherosclerosis of thoracic aorta is noted. No pneumothorax is noted. Minimal pleural effusions are noted. No consolidative process is noted. Bony thorax is unremarkable. IMPRESSION: Minimal pleural effusions.  No consolidative process is noted. Aortic Atherosclerosis (ICD10-I70.0). Electronically Signed   By: Marijo Conception, M.D.   On: 02/20/2018 09:40    Scheduled Meds: . aspirin EC  325 mg Oral Daily  . atorvastatin  40 mg Oral q1800  . enoxaparin (LOVENOX) injection  40 mg Subcutaneous Q24H  . furosemide  40 mg Intravenous BID  . levothyroxine  100 mcg Oral QAC  breakfast  . metoprolol tartrate  50 mg Oral BID  . sodium chloride flush  3 mL Intravenous Q12H   Continuous Infusions: . sodium chloride       LOS: 3 days    Time spent: 25 Minutes   Dana Allan, MD  [facility] Pager #: [phone] 7PM-7AM contact night coverage as above

## 2018-02-21 NOTE — Consult Note (Addendum)
[facility] TEAM  Inpatient TAVR Consultation:   Patient ID: Julie Mcgee; [id number]; [birth year]   Admit date: 02/18/2018 Date of Consult: 02/21/2018  Primary Care Provider: Glenford Bayley, DO Primary Cardiologist: Dr. Geraldo Pitter  Patient Profile:   Julie Mcgee is a 85 y.o. female with a hx of HTN, HLD, RBBB, previous CVA (on chronic anticoagulation with warfarin for a long period of time but this was stopped due to frequent falls and unsteady gait), carotid artery disease, moderate mitral stenosis and mod-severe AS who is being seen today for the evaluation of severe AS with acute CHF at the request of Dr. Radford Pax.  History of Present Illness:   Julie Mcgee used to work in [employer].  She retired about 10 years ago.  She has 4 children and moved from [place] to [place] in 03/2017 to be closer to her son.  She lives with her husband in [place] in an independent living cottage at [place].  She stays busy crocheting and doing light housework.  Of note, she has partial dentures on top and some of her native teeth on bottom.  She is scheduled to see Dr. Lynnette Caffey DDS in [place] this Thursday to evaluate a sore on her upper gum.  Otherwise, she does get regular dental checkups and is unaware of any active infections.  The patient has no prior cardiac history aside from moderate aortic stenosis which was followed in [place].  She had a massive stroke in 2011 with a prolonged rehabilitation.  She states that she had to relearn how to walk and speak.  Since that time she has walked with a cane and a walker. She has mild right handed weakness. She is mostly limited by balance issues but is able to do some light housework and do her own grocery shopping.  She established care with Dr. Geraldo Pitter with cardiology in 05/2017. He did not find any indication for coumadin use based on available records and recommended she switch coumadin to ASA 364m given  frequent falls. She sought a second opinion with neurology and eventually discontinued coumadin.   [facility].  She was felt to have orthostatic hypotension and discharged from the ER.  She was in her usual state of health until this past Thursday when she noticed worsening shortness of breath with exertion.  Just walking across the room caused significant dyspnea.  On 02/18/2018 she had an episode of acute shortness of breath, chest pain and dizziness that was associated with nausea and EMS was called.  [facility] was noted to be elevated at 735, troponin 0.10, chest x-ray with pulmonary vascular congestion.  She was admitted by the hospitalist service for IV diuresis.  Echocardiogram on 02/20/2018 showed normal LV function with moderate mitral stenosis and severe aortic stenosis with mild aortic regurgitation (mean gradient 43.5, peak gradient 68.6, DVI 0.25, AVA 0.70 cm.)  [facility] CB[facility]  This showed mild to moderate nonobstructive CAD with severe calcific aortic stenosis with a mean/peak gradient of 48/60 mmHg and AVA of 0.96 cm, normal LVEDP, mild pulm HTN and mildly elevated right heart pressures.  The structural heart team was consulted for consideration of TAVR.  The patient is currently feeling much better after IV diuresis.  She is eager to go  home.  Family is at bedside.  She and her husband are familiar with TAVR as her husband had a TAVR about 3 years ago in [place].  The patient currently denies shortness of breath, chest pain or dizziness.  She has had resolution of her lower extremity edema.  She denies orthopnea or PND.  Past Medical History:  Diagnosis Date  . Anticoagulant long-term use   . Arthritis   . Carotid artery stenosis   . CVA (cerebral vascular accident)  ([place])   . Former smoker    1/2 ppd for 45 years  . Hyperlipidemia   . Hypertension   . Hyponatremia   . Hypothyroidism   . Peripheral vascular disease [facility])     Past Surgical History:  Procedure Laterality Date  . BASAL CELL CARCINOMA EXCISION  2018  . COLONOSCOPY  2001  . HIP SURGERY  2005  . KNEE SURGERY  1998     Inpatient Medications: Scheduled Meds: . aspirin EC  325 mg Oral Daily  . atorvastatin  40 mg Oral q1800  . enoxaparin (LOVENOX) injection  40 mg Subcutaneous Q24H  . furosemide  40 mg Intravenous BID  . levothyroxine  100 mcg Oral QAC breakfast  . metoprolol tartrate  50 mg Oral BID  . sodium chloride flush  3 mL Intravenous Q12H   Continuous Infusions: . sodium chloride 75 mL/hr at 02/21/18 0900  . sodium chloride     PRN Meds: sodium chloride, acetaminophen **OR** acetaminophen, ondansetron **OR** ondansetron (ZOFRAN) IV, polyethylene glycol, sodium chloride flush, traZODone  Allergies:    Allergies  Allergen Reactions  . Adhesive [Tape] Other (See Comments)    Causes BLISTERS    Social History:   Social History   Socioeconomic History  . Marital status: Married    Spouse name: Not on file  . Number of children: Not on file  . Years of education: Not on file  . Highest education level: Not on file  Occupational History  . Not on file  Social Needs  . Financial resource strain: Not on file  . Food insecurity:    Worry: Not on file    Inability: Not on file  . Transportation needs:    Medical: Not on file    Non-medical: Not on file  Tobacco Use  . Smoking status: Former Smoker    Packs/day: 1.00    Years: 45.00    Pack years: 45.00    Types: Cigarettes    Last attempt to quit: 05/23/2009    Years since quitting: 8.7  . Smokeless tobacco: Never Used  Substance and Sexual Activity  . Alcohol use: Not Currently  . Drug use: Not on file  . Sexual activity: Not on file  Lifestyle  . Physical activity:    Days per week: Not on file     Minutes per session: Not on file  . Stress: Not on file  Relationships  . Social connections:    Talks on phone: Not on file    Gets together: Not on file    Attends religious service: Not on file    Active member of club or organization: Not on file    Attends meetings of clubs or organizations: Not on file    Relationship status: Not on file  . Intimate partner violence:    Fear of current or ex partner: Not on file    Emotionally abused: Not on file    Physically abused: Not on file    Forced  sexual activity: Not on file  Other Topics Concern  . Not on file  Social History Narrative  . Not on file    Family History:   The patient's family history includes Alcohol abuse in her father; Chronic Renal Failure in her mother; Heart failure in her mother.  ROS:  Please see the history of present illness.  ROS  All other ROS reviewed and negative.     Physical Exam/Data:   Vitals:   02/21/18 0847 02/21/18 0900 02/21/18 0911 02/21/18 1032  BP:  (!) 100/47 113/64 (!) 113/54  Pulse: (!) 0 60    Resp: (!) 0     Temp:      TempSrc:      SpO2: (!) 0% 93%    Weight:      Height:        Intake/Output Summary (Last 24 hours) at 02/21/2018 1040 Last data filed at 02/21/2018 0600 Gross per 24 hour  Intake 220 ml  Output 2150 ml  Net -1930 ml   Filed Weights   02/18/18 1828 02/20/18 0526 02/21/18 0250  Weight: 86.9 kg 85.7 kg 84.5 kg   Body mass index is 34.61 kg/m.  General: Obese, chronically ill-appearing white female HEENT: normal Lymph: no adenopathy Neck: no JVD Endocrine:  No thryomegaly Vascular: No carotid bruits; FA pulses 2+ bilaterally without bruits  Cardiac:  normal S1, S2; RRR; 4 out of 6 harsh systolic ejection murmur with radiation to the carotid Lungs:  clear to auscultation bilaterally, no wheezing, rhonchi or rales  Abd: soft, nontender, no hepatomegaly  Ext: no edema Musculoskeletal:  No deformities, BUE and BLE strength normal and equal Skin: warm  and dry  Neuro:  CNs 2-12 intact, no focal abnormalities noted Psych:  Normal affect   EKG:  The EKG was personally reviewed and demonstrates: Sinus with RBBB Telemetry:  Telemetry was personally reviewed and demonstrates: Sinus   Relevant CV Studies: Echo 02/20/18: 1. The left ventricle has hyperdynamic systolic function of >89%. The cavity size was normal. There is mildly increased left ventricular wall thickness. Echo evidence of pseudonormalization in diastolic relaxation Elevated mean left atrial pressure. 2. The right ventricle has normal systolic function. The cavity was normal. There is no increase in right ventricular wall thickness. 3. Left atrial size was severely dilated. 4. The mitral valve is rheumatic. There is moderate thickening and moderate calcification. There is severe mitral annular calcification present. moderate mitral valve stenosis. 5. The tricuspid valve is normal in structure. 6. The aortic valve is tricuspid There is severe thickening and severe calcifcation of the aortic valve. Aortic valve regurgitation is mild to moderate by color flow Doppler. The calculated aortic valve area is 0.71 cm, consistent with severe stenosis. 7. The inferior vena cava was dilated in size with >50% respiratory variability. 8. AV is thickened, calcified with restricted motion. Peak and mean gradients through the valve are 69 and 44 mm Hg repectively consistent with severe AS.  ______________  [facility] 02/21/18 Conclusion  1.  Mild to moderate focal proximal LAD stenosis at the origin of the first diagonal branch, does not appear flow obstructive 2.  Severe calcific aortic stenosis with a peak to peak gradient of 60 mmHg, mean gradient 48 mmHg, and calculated valve area 0.96 cm. 3.  Severe mitral annular calcification biplane fluoroscopy with moderate mitral stenosis present, mean gradient 10 mmHg, calculated valve area 1.7 cm 4.  Normal LVEDP, mild pulmonary hypertension, and  mildly elevated right heart pressures  Recommendations: Medical  therapy for nonobstructive CAD, continued evaluation for consideration of TAVR and treatment of this patient's valvular heart disease.  Anticipate moving forward with CTA studies of the heart and chest/abdomen/pelvis followed by cardiac surgical consultation as part of a multidisciplinary evaluation.    Laboratory Data:  Chemistry Recent Labs  Lab 02/18/18 1319 02/19/18 0548 02/20/18 0616  NA 136 140 137  K 4.6 3.9 3.6  CL 102 99 97*  CO2 _0 GLUCOSE 119* 89 99  BUN _1 CREATININE 0.84 1.09* 1.02*  CALCIUM 9.2 8.7* 8.9  GFRNONAA >60 47* 51*  GFRAA >60 55* 59*  ANIONGAP _2 Recent Labs  Lab 02/20/18 0616  ALBUMIN 3.1*   Hematology Recent Labs  Lab 02/18/18 1319 02/19/18 0548 02/20/18 0616  WBC 11.4* 10.9* 9.6  RBC 3.18* 3.11* 3.25*  HGB 8.8* 8.9* 9.1*  HCT 28.9* 27.6* 29.0*  MCV 90.9 88.7 89.2  MCH 27.7 28.6 28.0  MCHC 30.4 32.2 31.4  RDW 15.0 15.0 14.8  PLT 294 320 329   Cardiac Enzymes Recent Labs  Lab 02/18/18 1822 02/18/18 2341 02/19/18 0548  TROPONINI 0.12* 0.12* 0.10*    Recent Labs  Lab 02/18/18 1322  TROPIPOC 0.04    BNP Recent Labs  Lab 02/18/18 1319  BNP 735.4*    DDimer No results for input(s): DDIMER in the last 168 hours.  Radiology/Studies:  Dg Chest 2 View  Result Date: 02/20/2018 CLINICAL DATA:  Congestive heart failure. EXAM: CHEST - 2 VIEW COMPARISON:  Radiographs of February 18, 2018. FINDINGS: Stable cardiomegaly. Atherosclerosis of thoracic aorta is noted. No pneumothorax is noted. Minimal pleural effusions are noted. No consolidative process is noted. Bony thorax is unremarkable. IMPRESSION: Minimal pleural effusions.  No consolidative process is noted. Aortic Atherosclerosis (ICD10-I70.0). Electronically Signed   By: Marijo Conception, M.D.   On: 02/20/2018 09:40   Dg Chest 2 View  Result Date: 02/18/2018 CLINICAL DATA:  Episodes of SOB on  exertion for about 2 days, pt also reports she had a episode of non radiating centralized cp along with nausea that lasted around 5 min EXAM: CHEST - 2 VIEW COMPARISON:  None. FINDINGS: The heart is enlarged. There is significant mitral annulus calcification. Pulmonary vascular congestion is present. Patchy opacities are identified in the RIGHT UPPER lobe and RIGHT LOWER lobe. There is an associated RIGHT pleural effusion. L1 compression fracture is chronic. IMPRESSION: 1. Cardiomegaly and pulmonary vascular congestion. 2. RIGHT lung infiltrates and pleural effusion. Electronically Signed   By: Nolon Nations M.D.   On: 02/18/2018 14:18    STS Risk Calculator:  Procedure: Isolated AVR  Risk of Mortality:  2.989%   Renal Failure:  2.878%   Permanent Stroke:  2.169%   Prolonged Ventilation:  12.134%   DSW Infection:  0.160%   Reoperation:  2.566%   Morbidity or Mortality:  16.569%   Short Length of Stay:  22.586%   Long Length of Stay:  8.654%    Assessment and Plan:   Julie Mcgee is a 85 y.o. female with symptoms of severe, stage D1 aortic stenosis with NYHA Class III symptoms of chest pain, shortness of breath and fatigue who is currently admitted for acute CHF. I have reviewed the patient's recent echocardiogram which is notable for normal LV systolic function, mild mitral stenosis and severe aortic stenosis with peak gradient of 68.6 mmHg and mean transvalvular gradient of 43.5 mmHg. The patient's dimensionless index is 0.25 and calculated aortic  valve area is 0.70 cm. R/LHC today showed mild to moderate nonobstructive CAD with severe calcific aortic stenosis with a mean/peak gradient of 48/60 mmHg and AVA of 0.96 cm, normal LVEDP, mild pulm HTN and mildly elevated right heart pressures.  I have reviewed the natural history of aortic stenosis with the patient. We have discussed the limitations of medical therapy and the poor prognosis associated with symptomatic aortic stenosis. We have  reviewed potential treatment options, including palliative medical therapy, conventional surgical aortic valve replacement, and transcatheter aortic valve replacement. We discussed treatment options in the context of this patient's specific comorbid medical conditions.   The patient's predicted risk of mortality with conventional aortic valve replacement is 2.989% primarily based on age, acute CHF, previous CVA, mitral stenosis, HTN, obesity, carotid artery disease. Other significant comorbid conditions include chronic debilitation and poor mobility. TAVR seems like a reasonable treatment option for this patient pending formal cardiac surgical consultation. She would be a poor surgical candidate due to her advanced age, limited mobility, obesity and previous CVA.   We discussed typical evaluation which will require a gated cardiac CTA and a CTA of the chest/abdomen/pelvis to evaluate both his cardiac anatomy and peripheral vasculature, PFTs, carotid dopplers and PT eval. Follow-up testing will be arranged as an inpatient and outpatient. She can likely be discharged today on oral lasix. She has a dental evaluation this Thursday to evaluate a sore on her upper gum. CT scans have been arranged for Friday. We will call her with all other appointments.    Signed, Angelena Form, PA-C  02/21/2018 10:40 AM   Patient seen, examined. Available data reviewed. Agree with findings, assessment, and plan as outlined by Nell Range, PA-C. The patient is independently interviewed and examined. On my exam: Vitals:   02/21/18 1149 02/21/18 1622  BP: 124/60 100/72  Pulse:    Resp:    Temp: 97.8 F (36.6 C) 98 F (36.7 C)  SpO2: 96% 96%   Pt is alert and oriented, elderly woman NAD HEENT: normal Neck: JVP - normal, carotids 2+= with bilateral bruits Lungs: CTA bilaterally CV: RRR with a harsh 4/6 late peaking systolic murmur at the RUSB, no diastolic murmur appreciated Abd: soft, NT, Positive BS, no  hepatomegaly Ext: no C/C/E, distal pulses intact and equal Skin: warm/dry no rash  Echo images are personally reviewed. Cath data and images have been personally reviewed. The patient's echo demonstrates normal LV systolic function with severe mitral annular calcification especially involving the posterior annulus. She also has thickening of the mitral valve leaflets and doppler evidence of moderate MS. The aortic valve is severely calcified with partial fusion of the right and left cusps and severe aortic stenosis with a mean gradient of 52mHg and mild AI by color flow assessment. Cath data reveals nonobstructive LAD stenosis and otherwise widely patent coronary arteries. Invasive hemodynamics confirm moderate mitral stenosis and severe aortic stenosis. There is severe MAC again seen on plain fluoroscopy. Mean transaortic gradient by invasive assessment is also > 40 mmHg.   The patient has acute on chronic diastolic heart failure with NYHA functional class III symptoms in the presence of severe valvular heart disease with both aortic and mitral stenosis. She may have rheumatic disease or degenerative valvular stenosis. While her STS PROM is 3% for isolated AVR, I do not think she would be a candidate for conventional surgery as she would likely require AVR and MVR. Considering her advanced age, history of major stroke, and anatomic features of severe  calcification involving the mitral annulus, I suspect her surgical risk would be quite high. TAVR seems like a reasonable treatment consideration as her mitral stenosis appears to be in the moderate range by both cath and echo assessment. The patient will have CTA studies followed by formal cardiac surgical evaluation. She and her son and daughter in law are counseled extensively regarding TAVR work-up, procedural risks, expectations, and typical recovery. They understand that her CTA studies will be important in determining whether she is a candidate for  treatment. All follow-up will be arranged and she is stable for discharge tomorrow after PFT's are completed (scheduled for tomorrow am). Please call if any questions.   Sherren Mocha, M.D. 02/21/2018 4:58 PM

## 2018-02-21 NOTE — Progress Notes (Signed)
Patient TR band has been removed, gauze and tegraderm applied to site. Site is clean dry intact.

## 2018-02-21 NOTE — Progress Notes (Signed)
PT Cancellation Note  Patient Details Name: Julie Mcgee MRN: [id number] DOB: 1933-03-20   Cancelled Treatment:    Reason Eval/Treat Not Completed: Other (comment).  Pt was being seen by other staff when PT attempted, and will try again at another time.   Ramond Dial 02/21/2018, 4:36 PM   Mee Hives, PT MS Acute Rehab Dept. Number: [place] and [place]

## 2018-02-22 ENCOUNTER — Inpatient Hospital Stay (HOSPITAL_COMMUNITY): Payer: Medicare Other

## 2018-02-22 DIAGNOSIS — E039 Hypothyroidism, unspecified: Secondary | ICD-10-CM

## 2018-02-22 DIAGNOSIS — R079 Chest pain, unspecified: Secondary | ICD-10-CM

## 2018-02-22 DIAGNOSIS — R0602 Shortness of breath: Secondary | ICD-10-CM

## 2018-02-22 DIAGNOSIS — E785 Hyperlipidemia, unspecified: Secondary | ICD-10-CM

## 2018-02-22 LAB — PULMONARY FUNCTION TEST
DL/VA % pred: 73 %
DL/VA: 3.02 ml/min/mmHg/L
DLCO cor % pred: 49 %
DLCO cor: 8.42 ml/min/mmHg
DLCO unc % pred: 42 %
DLCO unc: 7.2 ml/min/mmHg
FEF 25-75 Pre: 0.8 L/sec
FEF2575-%Pred-Pre: 69 %
FEV1-%Pred-Pre: 75 %
FEV1-Pre: 1.24 L
FEV1FVC-%Pred-Pre: 92 %
FEV6-%Pred-Pre: 85 %
FEV6-Pre: 1.79 L
FEV6FVC-%Pred-Pre: 106 %
FVC-%Pred-Pre: 81 %
FVC-Pre: 1.82 L
Pre FEV1/FVC ratio: 68 %
Pre FEV6/FVC Ratio: 100 %

## 2018-02-22 MED ORDER — POTASSIUM CHLORIDE CRYS ER 20 MEQ PO TBCR: 20.0000 meq | EXTENDED_RELEASE_TABLET | Freq: Every day | ORAL | 0 refills | Status: DC

## 2018-02-22 MED ORDER — FUROSEMIDE 40 MG PO TABS: 40.0000 mg | ORAL_TABLET | Freq: Every day | ORAL | 0 refills | Status: DC

## 2018-02-22 MED ORDER — POTASSIUM CHLORIDE CRYS ER 20 MEQ PO TBCR
20.0000 meq | EXTENDED_RELEASE_TABLET | Freq: Every day | ORAL | Status: DC
  Administered 2018-02-22: 20 meq via ORAL
  Filled 2018-02-22: qty 1

## 2018-02-22 NOTE — Discharge Summary (Signed)
Physician Discharge Summary  Julie Mcgee UXL:[id number] DOB: June 17, 1933 DOA: 02/18/2018  PCP: Glenford Bayley, DO  Admit date: 02/18/2018 Discharge date: 02/22/2018  Admitted From: Home  Disposition:  Home   Recommendations for Outpatient Follow-up and new medication changes:  1. Follow up with Dr. Marin Comment in 7 days.  2. Follow with TAVR team, will need CT studies as outpatient.  3. HCTZ has been discontinued 4. Started with furosemide 40 mg daily along with Kcl supplements 20 meq daily.  5. Patient completed carotid US and full PFT inpatient.   Home Health: no   Equipment/Devices: no    Discharge Condition: stable  CODE STATUS: DNR   Diet recommendation: heart healthy, low salt and fluid restriction 1000 ml per day.    Brief/Interim Summary: 85 year old female who presented with dyspnea and chest pain.  She does have significant past medical history for hypertension, aortic stenosis, hypertension, and history of stroke who presented with 2 days of worsening dyspnea on exertion.  Reported 2 days of progressive symptoms, mainly on exertion, associated with lower extremity edema and chest pain.  On her initial physical examination blood pressure 146/56, heart rate 62, respiratory rate 23, oxygen saturation 95%, her lungs had rales bilaterally at bases, heart S1-S2 present, positive loud systolic murmur at the second left intercostal space, positive JVD, and lower extremity pitting edema.  Sodium 136, potassium 4.6, chloride 102, bicarb 23, glucose 119, BUN 23, creatinine 0.84, BNP 735, troponin 0.12, white count 11.4, hemoglobin 8.8, hematocrit 28.9, platelets 294, urinalysis 21-50 white cells.  Her chest x-ray had cardiomegaly with increased vascular congestion.  EKG sinus rhythm, normal axis, right bundle branch block.  Patient was admitted to the hospital with a working diagnosis of acute exacerbation of diastolic heart failure.   1.  Acute on chronic diastolic heart failure exacerbation, complicated  by aortic and mitral valve stenosis.  Patient was admitted to the telemetry unit, she received aggressive diuresis with IV furosemide, negative fluid balance was achieved (-4,590 ml and 8 kg weight loss since admission), with significant improvement of her symptoms.  Further work-up with echocardiography showed hyperdynamic left ventricle, ejection fraction greater than 65%.  Severe left atrial dilatation, mitral valve with rheumatic changes, moderate thickening and moderate calcification, severe mitral annular calcification, moderate mitral valve stenosis.  The aortic valve had severe thickening and severe calcification, calculated aortic valve area 0.71 cm consistent severe stenosis, peak and mean gradients 69 and 44 mmHg respectively.   2.  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  Aortic valve area 0.71 cm, peak and mean gradient 69 and 44 mmHg.  Further work-up with cardiac catheterization showed mild to moderate focal proximal LAD stenosis. Severe calcific aortic stenosis peak to peak gradient 60 mmHg, mean gradient 48 mmHg, calculated valve area 0.96 cm.  Severe mitral annular calcification, moderate stenosis, mean gradient 10 mmHg, valve area 1.7 cm.  Normal left ventricle end-diastolic pressure.  Mild pulmonary hypertension. Patient was seen by cardiology, possible good candidate for TAVR, will need further CTA studies as an outpatient.  She underwent full PFT while hospitalized.  Carotid ultrasonography bilaterally with no significant stenosis.   3.  Hypertension.  Blood pressure remained well controlled, metoprolol 50 mg twice daily, hydrochlorothiazide has been changed to furosemide to improve diuresis.  4.  Hypothyroidism.  Continue levothyroxine.  5.  Dyslipidemia.  Continue statin therapy.  6.  History of CVA.  Residual right hand weakness and dysarthria.  Patient will continue full dose aspirin.  Currently  warfarin was discontinued several months ago  as an outpatient.  Discharge Diagnoses:  Active Problems:   Hypertension   Hyperlipidemia   Hypothyroidism   Severe aortic stenosis   History of stroke   CHF (congestive heart failure) (HCC)   Leukocytosis    Discharge Instructions   Allergies as of 02/22/2018      Reactions   Adhesive [tape] Other (See Comments)   Causes BLISTERS      Medication List    STOP taking these medications   hydrochlorothiazide 12.5 MG tablet Commonly known as:  HYDRODIURIL     TAKE these medications   aspirin EC 325 MG tablet Take 325 mg by mouth daily.   CALTRATE 600+D3 PO Take 1 tablet by mouth 2 (two) times daily.   furosemide 40 MG tablet Commonly known as:  LASIX Take 1 tablet (40 mg total) by mouth daily for 30 days.   levothyroxine 100 MCG tablet Commonly known as:  SYNTHROID, LEVOTHROID Take 100 mcg by mouth daily before breakfast.   metoprolol tartrate 50 MG tablet Commonly known as:  LOPRESSOR Take 50 mg by mouth 2 (two) times daily.   MULTIVITAMIN ADULTS 50+ Tabs Take 1 tablet by mouth daily.   potassium chloride SA 20 MEQ tablet Commonly known as:  K-DUR,KLOR-CON Take 1 tablet (20 mEq total) by mouth daily for 30 days.   simvastatin 80 MG tablet Commonly known as:  ZOCOR Take 80 mg by mouth every evening.      Follow-up Information    [facility] Follow up.   Specialty:  Home Health Services Why:  they will contact you for the first visit. Contact information: [place] [address] [phone]          Allergies  Allergen Reactions  . Adhesive [Tape] Other (See Comments)    Causes BLISTERS    Consultations:  Cardiology    Procedures/Studies: Dg Chest 2 View  Result Date: 02/20/2018 CLINICAL DATA:  Congestive heart failure. EXAM: CHEST - 2 VIEW COMPARISON:  Radiographs of February 18, 2018. FINDINGS: Stable cardiomegaly. Atherosclerosis of thoracic aorta is noted. No pneumothorax is noted. Minimal  pleural effusions are noted. No consolidative process is noted. Bony thorax is unremarkable. IMPRESSION: Minimal pleural effusions.  No consolidative process is noted. Aortic Atherosclerosis (ICD10-I70.0). Electronically Signed   By: Marijo Conception, M.D.   On: 02/20/2018 09:40   Dg Chest 2 View  Result Date: 02/18/2018 CLINICAL DATA:  Episodes of SOB on exertion for about 2 days, pt also reports she had a episode of non radiating centralized cp along with nausea that lasted around 5 min EXAM: CHEST - 2 VIEW COMPARISON:  None. FINDINGS: The heart is enlarged. There is significant mitral annulus calcification. Pulmonary vascular congestion is present. Patchy opacities are identified in the RIGHT UPPER lobe and RIGHT LOWER lobe. There is an associated RIGHT pleural effusion. L1 compression fracture is chronic. IMPRESSION: 1. Cardiomegaly and pulmonary vascular congestion. 2. RIGHT lung infiltrates and pleural effusion. Electronically Signed   By: Nolon Nations M.D.   On: 02/18/2018 14:18   Vas US Carotid  Result Date: 02/21/2018 Carotid Arterial Duplex Study Indications: TAVR. Performing Technologist: June Leap RDMS, RVT  Examination Guidelines: A complete evaluation includes B-mode imaging, spectral Doppler, color Doppler, and power Doppler as needed of all accessible portions of each vessel. Bilateral testing is considered an integral part of a complete examination. Limited examinations for reoccurring indications may be performed as noted.  Right  Carotid Findings: +----------+--------+--------+--------+------------+--------+           PSV cm/sEDV cm/sStenosisDescribe    Comments +----------+--------+--------+--------+------------+--------+ CCA Prox  81                                           +----------+--------+--------+--------+------------+--------+ CCA Distal61      7                                    +----------+--------+--------+--------+------------+--------+ ICA Prox   69      18      1-39%   heterogenous         +----------+--------+--------+--------+------------+--------+ ICA Distal53      12                                   +----------+--------+--------+--------+------------+--------+ ECA       81      12                                   +----------+--------+--------+--------+------------+--------+ +----------+--------+-------+----------------+-------------------+           PSV cm/sEDV cmsDescribe        Arm Pressure (mmHG) +----------+--------+-------+----------------+-------------------+ [id number]            Multiphasic, WNL                    +----------+--------+-------+----------------+-------------------+ +---------+--------+--------+------+ VertebralPSV cm/sEDV cm/sAbsent +---------+--------+--------+------+  Left Carotid Findings: +----------+--------+--------+--------+-------------------------+--------+           PSV cm/sEDV cm/sStenosisDescribe                 Comments +----------+--------+--------+--------+-------------------------+--------+ CCA Prox  95      9                                                 +----------+--------+--------+--------+-------------------------+--------+ CCA Distal75      8                                                 +----------+--------+--------+--------+-------------------------+--------+ ICA Prox  120     21      1-39%   heterogenous and calcific         +----------+--------+--------+--------+-------------------------+--------+ ICA Distal124     15                                                +----------+--------+--------+--------+-------------------------+--------+ ECA       218     7                                                 +----------+--------+--------+--------+-------------------------+--------+ +----------+--------+--------+----------------+-------------------+ SubclavianPSV cm/sEDV cm/sDescribe  Arm Pressure (mmHG)  +----------+--------+--------+----------------+-------------------+           158             Multiphasic, WNL                    +----------+--------+--------+----------------+-------------------+ +---------+--------+--+--------+--+---------+ VertebralPSV cm/s50EDV cm/s12Antegrade +---------+--------+--+--------+--+---------+  Summary: Right Carotid: Velocities in the right ICA are consistent with a 1-39% stenosis. Left Carotid: Velocities in the left ICA are consistent with a 1-39% stenosis.  *See table(s) above for measurements and observations.  Electronically signed by Deitra Mayo MD on 02/21/2018 at 4:51:54 PM.    Final        Subjective: Patient is feeling better, no further dyspnea or chest pain, no nausea or vomiting.   Discharge Exam: Vitals:   02/21/18 2132 02/22/18 0645  BP: (!) 137/49 (!) 126/48  Pulse: 66 63  Resp: 18 18  Temp: 98.3 F (36.8 C) 98 F (36.7 C)  SpO2: 98% 96%   Vitals:   02/21/18 1622 02/21/18 2132 02/22/18 0253 02/22/18 0645  BP: 100/72 (!) 137/49  (!) 126/48  Pulse:  66  63  Resp:  18  18  Temp: 98 F (36.7 C) 98.3 F (36.8 C)  98 F (36.7 C)  TempSrc: Oral Oral  Oral  SpO2: 96% 98%  96%  Weight:   82.4 kg   Height:        General: Not in pain or dyspnea.  Neurology: Awake and alert, non focal  E ENT: no pallor, no icterus, oral mucosa moist Cardiovascular: No JVD. S1-S2 present, rhythmic, no gallops, or rubs. Positive systolic murmur 3/6, right sternal border, radiated to the neck. No lower extremity edema. Pulmonary: positive breath sounds bilaterally, adequate air movement, no wheezing, rhonchi or rales. Gastrointestinal. Abdomen with no organomegaly, non tender, no rebound or guarding Skin. No rashes Musculoskeletal: no joint deformities   The results of significant diagnostics from this hospitalization (including imaging, microbiology, ancillary and laboratory) are listed below for reference.     Microbiology: No  results found for this or any previous visit (from the past 240 hour(s)).   Labs: BNP (last 3 results) Recent Labs    02/18/18 1319  BNP 601.0*   Basic Metabolic Panel: Recent Labs  Lab 02/18/18 1319 02/19/18 0548 02/20/18 0616 02/21/18 0817 02/21/18 0818  NA 136 140 137 136 136  K 4.6 3.9 3.6 3.5 3.4*  CL 102 99 97*  --   --   CO2 23 26 27   --   --   GLUCOSE 119* 89 99  --   --   BUN 23 22 23   --   --   CREATININE 0.84 1.09* 1.02*  --   --   CALCIUM 9.2 8.7* 8.9  --   --   MG  --   --  2.0  --   --   PHOS  --   --  4.1  --   --    Liver Function Tests: Recent Labs  Lab 02/20/18 0616  ALBUMIN 3.1*   No results for input(s): LIPASE, AMYLASE in the last 168 hours. No results for input(s): AMMONIA in the last 168 hours. CBC: Recent Labs  Lab 02/18/18 1319 02/19/18 0548 02/20/18 0616 02/21/18 0817 02/21/18 0818  WBC 11.4* 10.9* 9.6  --   --   NEUTROABS 9.8*  --  6.0  --   --   HGB 8.8* 8.9* 9.1* 9.5* 9.5*  HCT 28.9* 27.6* 29.0* 28.0* 28.0*  MCV 90.9 88.7 89.2  --   --   PLT 294 320 329  --   --    Cardiac Enzymes: Recent Labs  Lab 02/18/18 1822 02/18/18 2341 02/19/18 0548  TROPONINI 0.12* 0.12* 0.10*   BNP: Invalid input(s): POCBNP CBG: No results for input(s): GLUCAP in the last 168 hours. D-Dimer No results for input(s): DDIMER in the last 72 hours. Hgb A1c No results for input(s): HGBA1C in the last 72 hours. Lipid Profile No results for input(s): CHOL, HDL, LDLCALC, TRIG, CHOLHDL, LDLDIRECT in the last 72 hours. Thyroid function studies No results for input(s): TSH, T4TOTAL, T3FREE, THYROIDAB in the last 72 hours.  Invalid input(s): FREET3 Anemia work up No results for input(s): VITAMINB12, FOLATE, FERRITIN, TIBC, IRON, RETICCTPCT in the last 72 hours. Urinalysis    Component Value Date/Time   COLORURINE YELLOW 02/18/2018 1544   APPEARANCEUR HAZY (A) 02/18/2018 1544   LABSPEC 1.019 02/18/2018 1544   PHURINE 5.0 02/18/2018 1544    GLUCOSEU NEGATIVE 02/18/2018 1544   HGBUR NEGATIVE 02/18/2018 [place] 02/18/2018 1544   KETONESUR NEGATIVE 02/18/2018 1544   PROTEINUR NEGATIVE 02/18/2018 1544   NITRITE NEGATIVE 02/18/2018 1544   LEUKOCYTESUR MODERATE (A) 02/18/2018 1544   Sepsis Labs Invalid input(s): PROCALCITONIN,  WBC,  LACTICIDVEN Microbiology No results found for this or any previous visit (from the past 240 hour(s)).   Time coordinating discharge: 45 minutes  SIGNED:   Tawni Millers, MD  [facility] 02/22/2018, 8:45 AM

## 2018-02-22 NOTE — Progress Notes (Signed)
Physical Therapy Treatment Patient Details Name: Julie Mcgee MRN: [id number] DOB: 23-Dec-1933 Today's Date: 02/22/2018    History of Present Illness Pt is an 85 y.o. female admitted 02/18/18 with SOB and fatigue; worked up for CHF exacerbation. Workup revealed severe aortic valve stenosis/moderate mitral stenosi; potential plan for TAVR 02/28/18. PMH includes CAD, CVA, HTN, PVD, aortic stenosis.   PT Comments    Pt progressing well with mobility. Ambulatory with RW at supervision-level; able to increase ambulation distance with intermittent standing rest breaks secondary to fatigue. SpO2 >/90% on RA with ambulation. Pt motivated for discharge home today.    Follow Up Recommendations  Home health PT;Supervision for mobility/OOB     Equipment Recommendations  None recommended by PT    Recommendations for Other Services       Precautions / Restrictions Precautions Precautions: Fall Restrictions Weight Bearing Restrictions: No    Mobility  Bed Mobility Overal bed mobility: Independent                Transfers Overall transfer level: Modified independent Equipment used: Rolling walker (2 wheeled) Transfers: Sit to/from Stand           General transfer comment: Mod indep standing from bed and BSC over toilet to RW  Ambulation/Gait Ambulation/Gait assistance: Supervision Gait Distance (Feet): 400 Feet Assistive device: Rolling walker (2 wheeled) Gait Pattern/deviations: Step-through pattern;Decreased stride length;Trunk flexed Gait velocity: Decreased Gait velocity interpretation: 1.31 - 2.62 ft/sec, indicative of limited community ambulator General Gait Details: Slow, steady gait with RW and supervision for safety; intermittent cues to maintain closer proximity to RW, pt tends to drift to L-side of it. Intermittent standing rest breaks secondary to DOE/fatigue. SpO2 >90% on RA   Stairs             Wheelchair Mobility    Modified Rankin (Stroke Patients  Only)       Balance Overall balance assessment: Needs assistance Sitting-balance support: No upper extremity supported;Feet supported Sitting balance-Leahy Scale: Good     Standing balance support: Bilateral upper extremity supported;During functional activity Standing balance-Leahy Scale: Fair Standing balance comment: Can static stand without UE support; dynamic stability improved with RW                            Cognition Arousal/Alertness: Awake/alert Behavior During Therapy: WFL for tasks assessed/performed Overall Cognitive Status: Within Functional Limits for tasks assessed                                 General Comments: Decreased attention, easily distracted; likely baseline cognition. Baseline dysarthria      Exercises      General Comments General comments (skin integrity, edema, etc.): Son and daughter-in-law present at end of session      Pertinent Vitals/Pain Pain Assessment: No/denies pain    Home Living                      Prior Function            PT Goals (current goals can now be found in the care plan section) Acute Rehab PT Goals Patient Stated Goal: to go home PT Goal Formulation: With patient Time For Goal Achievement: 03/06/18 Potential to Achieve Goals: Good Progress towards PT goals: Progressing toward goals    Frequency    Min 3X/week      PT Plan Current  plan remains appropriate    Co-evaluation              AM-PAC PT "6 Clicks" Mobility   Outcome Measure  Help needed turning from your back to your side while in a flat bed without using bedrails?: None Help needed moving from lying on your back to sitting on the side of a flat bed without using bedrails?: None Help needed moving to and from a bed to a chair (including a wheelchair)?: None Help needed standing up from a chair using your arms (e.g., wheelchair or bedside chair)?: A Little Help needed to walk in hospital room?: A  Little Help needed climbing 3-5 steps with a railing? : A Little 6 Click Score: 21    End of Session Equipment Utilized During Treatment: Gait belt Activity Tolerance: Patient tolerated treatment well Patient left: in bed;with call bell/phone within reach;with family/visitor present;Other (comment)(with MD present) Nurse Communication: Mobility status PT Visit Diagnosis: Muscle weakness (generalized) (M62.81)     Time: 8756-4332 PT Time Calculation (min) (ACUTE ONLY): 17 min  Charges:  $Gait Training: 8-22 mins                    Mabeline Caras, PT, DPT Acute Rehabilitation Services  Pager [phone] Office [phone]  Derry Lory 02/22/2018, 10:54 AM

## 2018-02-23 DIAGNOSIS — I110 Hypertensive heart disease with heart failure: Secondary | ICD-10-CM | POA: Diagnosis not present

## 2018-02-23 DIAGNOSIS — I350 Nonrheumatic aortic (valve) stenosis: Secondary | ICD-10-CM | POA: Diagnosis not present

## 2018-02-23 DIAGNOSIS — Z87891 Personal history of nicotine dependence: Secondary | ICD-10-CM | POA: Diagnosis not present

## 2018-02-23 DIAGNOSIS — M6281 Muscle weakness (generalized): Secondary | ICD-10-CM | POA: Diagnosis not present

## 2018-02-23 DIAGNOSIS — I69331 Monoplegia of upper limb following cerebral infarction affecting right dominant side: Secondary | ICD-10-CM | POA: Diagnosis not present

## 2018-02-23 DIAGNOSIS — R2681 Unsteadiness on feet: Secondary | ICD-10-CM | POA: Diagnosis not present

## 2018-02-23 DIAGNOSIS — I69322 Dysarthria following cerebral infarction: Secondary | ICD-10-CM | POA: Diagnosis not present

## 2018-02-23 DIAGNOSIS — I872 Venous insufficiency (chronic) (peripheral): Secondary | ICD-10-CM | POA: Diagnosis not present

## 2018-02-23 DIAGNOSIS — I5033 Acute on chronic diastolic (congestive) heart failure: Secondary | ICD-10-CM | POA: Diagnosis not present

## 2018-02-24 ENCOUNTER — Ambulatory Visit: Payer: Medicare Other | Admitting: Physical Therapy

## 2018-02-24 ENCOUNTER — Ambulatory Visit (HOSPITAL_COMMUNITY)
Admit: 2018-02-24 | Discharge: 2018-02-24 | Disposition: A | Discharge status: Home / Self Care | Payer: Medicare Other | Attending: Cardiovascular Disease | Admitting: Cardiovascular Disease

## 2018-02-24 ENCOUNTER — Other Ambulatory Visit: Payer: Self-pay

## 2018-02-24 ENCOUNTER — Ambulatory Visit (HOSPITAL_COMMUNITY)
Admission: RE | Admit: 2018-02-24 | Discharge: 2018-02-24 | Disposition: A | Discharge status: Home / Self Care | Payer: Medicare Other | Source: Ambulatory Visit | Attending: Cardiovascular Disease | Admitting: Cardiovascular Disease

## 2018-02-24 DIAGNOSIS — K449 Diaphragmatic hernia without obstruction or gangrene: Secondary | ICD-10-CM | POA: Diagnosis not present

## 2018-02-24 DIAGNOSIS — I5033 Acute on chronic diastolic (congestive) heart failure: Secondary | ICD-10-CM | POA: Diagnosis not present

## 2018-02-24 DIAGNOSIS — I350 Nonrheumatic aortic (valve) stenosis: Secondary | ICD-10-CM | POA: Insufficient documentation

## 2018-02-24 DIAGNOSIS — I110 Hypertensive heart disease with heart failure: Secondary | ICD-10-CM | POA: Diagnosis not present

## 2018-02-24 DIAGNOSIS — Z952 Presence of prosthetic heart valve: Secondary | ICD-10-CM | POA: Diagnosis not present

## 2018-02-24 DIAGNOSIS — I69331 Monoplegia of upper limb following cerebral infarction affecting right dominant side: Secondary | ICD-10-CM | POA: Diagnosis not present

## 2018-02-24 DIAGNOSIS — I872 Venous insufficiency (chronic) (peripheral): Secondary | ICD-10-CM | POA: Diagnosis not present

## 2018-02-24 DIAGNOSIS — I69322 Dysarthria following cerebral infarction: Secondary | ICD-10-CM | POA: Diagnosis not present

## 2018-02-24 MED ORDER — IOPAMIDOL (ISOVUE-370) INJECTION 76%
100.0000 mL | Freq: Once | INTRAVENOUS | Status: AC | PRN
  Administered 2018-02-24: 100 mL via INTRAVENOUS

## 2018-02-27 ENCOUNTER — Encounter: Payer: Self-pay | Admitting: Thoracic Surgery (Cardiothoracic Vascular Surgery)

## 2018-02-27 ENCOUNTER — Encounter (HOSPITAL_COMMUNITY)
Admission: RE | Admit: 2018-02-27 | Discharge: 2018-02-27 | Disposition: A | Discharge status: Home / Self Care | Payer: Medicare Other | Source: Ambulatory Visit | Attending: Cardiovascular Disease | Admitting: Cardiovascular Disease

## 2018-02-27 ENCOUNTER — Institutional Professional Consult (permissible substitution) (INDEPENDENT_AMBULATORY_CARE_PROVIDER_SITE_OTHER): Payer: Medicare Other | Admitting: Thoracic Surgery (Cardiothoracic Vascular Surgery)

## 2018-02-27 ENCOUNTER — Other Ambulatory Visit: Payer: Self-pay

## 2018-02-27 ENCOUNTER — Encounter (HOSPITAL_COMMUNITY): Payer: Self-pay

## 2018-02-27 VITALS — BP 144/58 | HR 62 | Resp 20 | Ht 61.5 in | Wt 180.0 lb

## 2018-02-27 DIAGNOSIS — I350 Nonrheumatic aortic (valve) stenosis: Secondary | ICD-10-CM

## 2018-02-27 DIAGNOSIS — Z01812 Encounter for preprocedural laboratory examination: Secondary | ICD-10-CM | POA: Insufficient documentation

## 2018-02-27 LAB — COMPREHENSIVE METABOLIC PANEL
ALT: 12 U/L (ref 0–44)
AST: 18 U/L (ref 15–41)
Albumin: 3.7 g/dL (ref 3.5–5.0)
Alkaline Phosphatase: 68 U/L (ref 38–126)
Anion gap: 13 (ref 5–15)
BUN: 28 mg/dL — ABNORMAL HIGH (ref 8–23)
CO2: 19 mmol/L — ABNORMAL LOW (ref 22–32)
Calcium: 9.3 mg/dL (ref 8.9–10.3)
Chloride: 105 mmol/L (ref 98–111)
Creatinine, Ser: 0.91 mg/dL (ref 0.44–1.00)
GFR calc Af Amer: >60 mL/min (ref >60)
GFR calc non Af Amer: 59 mL/min — ABNORMAL LOW (ref >60)
Glucose, Bld: 100 mg/dL — ABNORMAL HIGH (ref 70–99)
Potassium: 4.3 mmol/L (ref 3.5–5.1)
Sodium: 137 mmol/L (ref 135–145)
Total Bilirubin: 0.6 mg/dL (ref 0.3–1.2)
Total Protein: 6.9 g/dL (ref 6.5–8.1)

## 2018-02-27 LAB — BLOOD GAS, ARTERIAL
Acid-base deficit: 1.5 mmol/L (ref 0.0–2.0)
Bicarbonate: 20.8 mmol/L (ref 20.0–28.0)
Drawn by: 42180
FIO2: 21
O2 Saturation: 99.4 %
Patient temperature: 98.6
pCO2 arterial: 24 mmHg — ABNORMAL LOW (ref 32.0–48.0)
pH, Arterial: 7.547 — ABNORMAL HIGH (ref 7.350–7.450)
pO2, Arterial: 142 mmHg — ABNORMAL HIGH (ref 83.0–108.0)

## 2018-02-27 LAB — URINALYSIS, ROUTINE W REFLEX MICROSCOPIC
Bilirubin Urine: NEGATIVE
Glucose, UA: NEGATIVE mg/dL
Hgb urine dipstick: NEGATIVE
Ketones, ur: NEGATIVE mg/dL
Nitrite: NEGATIVE
Protein, ur: NEGATIVE mg/dL
Specific Gravity, Urine: 1.023 (ref 1.005–1.030)
pH: 6 (ref 5.0–8.0)

## 2018-02-27 LAB — BRAIN NATRIURETIC PEPTIDE: B Natriuretic Peptide: 1020.2 pg/mL — ABNORMAL HIGH (ref 0.0–100.0)

## 2018-02-27 LAB — CBC
HCT: 28.2 % — ABNORMAL LOW (ref 36.0–46.0)
Hemoglobin: 8.7 g/dL — ABNORMAL LOW (ref 12.0–15.0)
MCH: 27.3 pg (ref 26.0–34.0)
MCHC: 30.9 g/dL (ref 30.0–36.0)
MCV: 88.4 fL (ref 80.0–100.0)
Platelets: 357 10*3/uL (ref 150–400)
RBC: 3.19 MIL/uL — ABNORMAL LOW (ref 3.87–5.11)
RDW: 15.2 % (ref 11.5–15.5)
WBC: 10.4 10*3/uL (ref 4.0–10.5)
nRBC: 0 % (ref 0.0–0.2)

## 2018-02-27 LAB — SURGICAL PCR SCREEN
MRSA, PCR: NEGATIVE
Staphylococcus aureus: NEGATIVE

## 2018-02-27 LAB — PROTIME-INR
INR: 1.13
Prothrombin Time: 14.4 seconds (ref 11.4–15.2)

## 2018-02-27 LAB — ABO/RH: ABO/RH(D): A POS

## 2018-02-27 LAB — APTT: aPTT: 33 seconds (ref 24–36)

## 2018-02-27 MED ORDER — NITROGLYCERIN IN D5W 200-5 MCG/ML-% IV SOLN
2.0000 ug/min | INTRAVENOUS | Status: DC
  Filled 2018-02-27: qty 250

## 2018-02-27 MED ORDER — DOPAMINE-DEXTROSE 3.2-5 MG/ML-% IV SOLN
0.0000 ug/kg/min | INTRAVENOUS | Status: DC
  Filled 2018-02-27: qty 250

## 2018-02-27 MED ORDER — PHENYLEPHRINE HCL-NACL 20-0.9 MG/250ML-% IV SOLN
30.0000 ug/min | INTRAVENOUS | Status: DC
  Filled 2018-02-27: qty 250

## 2018-02-27 MED ORDER — NOREPINEPHRINE 4 MG/250ML-% IV SOLN
0.0000 ug/min | INTRAVENOUS | Status: DC
  Filled 2018-02-27: qty 250

## 2018-02-27 MED ORDER — MAGNESIUM SULFATE 50 % IJ SOLN
40.0000 meq | INTRAMUSCULAR | Status: DC
  Filled 2018-02-27: qty 9.85

## 2018-02-27 MED ORDER — SODIUM CHLORIDE 0.9 % IV SOLN
INTRAVENOUS | Status: DC
  Filled 2018-02-27: qty 30

## 2018-02-27 MED ORDER — VANCOMYCIN HCL 10 G IV SOLR
1250.0000 mg | INTRAVENOUS | Status: AC
  Administered 2018-02-28: 1250 mg via INTRAVENOUS
  Filled 2018-02-27: qty 1250

## 2018-02-27 MED ORDER — POTASSIUM CHLORIDE 2 MEQ/ML IV SOLN
80.0000 meq | INTRAVENOUS | Status: DC
  Filled 2018-02-27: qty 40

## 2018-02-27 MED ORDER — SODIUM CHLORIDE 0.9 % IV SOLN
1.5000 g | INTRAVENOUS | Status: AC
  Administered 2018-02-28: 1.5 g via INTRAVENOUS
  Filled 2018-02-27: qty 1.5

## 2018-02-27 MED ORDER — INSULIN REGULAR(HUMAN) IN NACL 100-0.9 UT/100ML-% IV SOLN
INTRAVENOUS | Status: DC
  Filled 2018-02-27: qty 100

## 2018-02-27 MED ORDER — EPINEPHRINE PF 1 MG/ML IJ SOLN
0.0000 ug/min | INTRAVENOUS | Status: DC
  Filled 2018-02-27: qty 4

## 2018-02-27 MED ORDER — DEXMEDETOMIDINE HCL IN NACL 400 MCG/100ML IV SOLN
0.1000 ug/kg/h | INTRAVENOUS | Status: AC
  Administered 2018-02-28: 1 ug/kg/h via INTRAVENOUS
  Filled 2018-02-27: qty 100

## 2018-02-27 NOTE — Progress Notes (Signed)
PCP - Dr. Crissie Sickles Cardiologist - Dr. Geraldo Pitter  Chest x-ray - 02/20/2018 EKG - 02/19/2018 Stress Test -  ECHO - 02/20/2018 Cardiac Cath - 02/21/2018  Sleep Study - patient denies CPAP -   Fasting Blood Sugar - n/a Checks Blood Sugar _____ times a day  Blood Thinner Instructions: n/a Aspirin Instructions: continue but do not take the day of surgery  Anesthesia review: yes, Jeneen Rinks notified this morning since patient has a cardiac history, abnormal EKG, and is scheduled for surgery tomorrow.  Patient denies shortness of breath, fever, cough and chest pain at PAT appointment   Patient verbalized understanding of instructions that were given to them at the PAT appointment. Patient was also instructed that they will need to review over the PAT instructions again at home before surgery.

## 2018-02-27 NOTE — H&P (View-Only) (Signed)
[facility]  MULTIDISCIPLINARY HEART VALVE CLINIC  CARDIOTHORACIC SURGERY CONSULTATION REPORT  Referring Provider is Sueanne Margarita, MD  Primary Cardiologist is Revankar, Sunny Schlein, MD PCP is Masneri, Adele Barthel, DO  Chief Complaint  Julie Mcgee presents with  . Aortic Stenosis    TAVR, review all studies    HPI:  Julie Mcgee is an 85 year old female with history of aortic stenosis, mitral stenosis, hypertension, previous stroke with residual right hemiparesis, right bundle branch block, and hyperlipidemia who has been referred for surgical consultation to discuss treatment options for management of severe symptomatic aortic stenosis.  Julie Mcgee states she has known of presence of a heart murmur for many years.  She suffered a massive stroke in 2011 which left her with residual right-sided hemiparesis.  She apparently had a prolonged rehabilitation.  She was followed for aortic stenosis by a local cardiologist, and approximately 1 year ago she moved to [place] to live near her son.  She has been followed by Dr. Geraldo Pitter since May 2019.  She admits to a long history of exertional shortness of breath.    In October 2019 she had a syncopal episode for which she was evaluated in the emergency department.  Her symptoms were attributed to orthostatic hypotension.  She was not seen by cardiology at that time.    Symptoms of shortness of breath rapidly progressed over the last few weeks culminating the Julie Mcgee's hospital admission with resting shortness of breath, chest discomfort, and dizziness without syncope.  BNP level was mildly elevated and chest x-ray revealed pulmonary vascular congestion.  She was hospitalized for intravenous diuresis.  Transthoracic echocardiogram performed February 20, 2018 revealed normal left ventricular systolic function with moderate mitral stenosis and severe aortic stenosis with mild aortic insufficiency.  Peak velocity across the aortic valve was measured  4.1 m/s corresponding to mean transvalvular gradient estimated 43 mmHg.  DVI was 0.25 with aortic valve area calculated 0.7 cm.  Mean transvalvular gradient across mitral valve was estimated 9.0 mmHg corresponding to the mitral valve area estimated 1.46 cm using pressure half-time.  Left ventricular function appeared normal with ejection fraction estimated greater than 65%.  The Julie Mcgee was seen in consultation by Dr. Burt Knack and underwent left and right heart catheterization on February 21, 2018.  This revealed mild to moderate nonobstructive coronary artery disease.  There was severe aortic stenosis with peak to peak and mean transvalvular gradients measured 60 and 48 mmHg respectively.  Aortic valve area was calculated 0.96 cm.  The mitral valve was severely calcified.  Mean transvalvular gradient across the mitral valve was measured 10 mmHg corresponding to mitral valve area calculated 1.7 cm.  There was mild pulmonary hypertension and mildly elevated right heart pressures.  Julie Mcgee was discharged from the hospital and underwent CT angiography.  She was seen by a dentist last week.  She has been cleared for surgical intervention.  Julie Mcgee is married and lives locally in [place] with her husband.  Her son lives several miles away and is very supportive and accompanies her for her office consultation visit today.  Julie Mcgee has significant residual right-sided hemiparesis and unstable gait dating back to her stroke in 2011.  She walks using a rolling walker for support.  She has had several mechanical falls.  She gets short of breath with exertion.  At the time of her recent hospitalization she had resting shortness of breath and chest discomfort.  The symptoms resolved with intravenous diuresis.  She has mild memory problems with impaired short-term  memory.  She does not drive an automobile.  She is able to get around acceptably well at home but her mobility is quite limited.  Past Medical History:    Diagnosis Date  . Anticoagulant long-term use   . Arthritis   . Carotid artery stenosis   . CVA (cerebral vascular accident) ([place])   . Former smoker    1/2 ppd for 45 years  . Hyperlipidemia   . Hypertension   . Hyponatremia   . Hypothyroidism   . Peripheral vascular disease ([place])   . RBBB     Past Surgical History:  Procedure Laterality Date  . BASAL CELL CARCINOMA EXCISION  2018  . COLONOSCOPY  2001  . HIP SURGERY  2005  . KNEE SURGERY  1998  . RIGHT/LEFT HEART CATH AND CORONARY ANGIOGRAPHY N/A 02/21/2018   Procedure: RIGHT/LEFT HEART CATH AND CORONARY ANGIOGRAPHY;  Surgeon: Sherren Mocha, MD;  Location: [facility];  Service: Cardiovascular;  Laterality: N/A;    Family History  Problem Relation Age of Onset  . Heart failure Mother   . Chronic Renal Failure Mother   . Alcohol abuse Father     Social History   Socioeconomic History  . Marital status: Married    Spouse name: Not on file  . Number of children: Not on file  . Years of education: Not on file  . Highest education level: Not on file  Occupational History  . Not on file  Social Needs  . Financial resource strain: Not on file  . Food insecurity:    Worry: Not on file    Inability: Not on file  . Transportation needs:    Medical: Not on file    Non-medical: Not on file  Tobacco Use  . Smoking status: Former Smoker    Packs/day: 1.00    Years: 45.00    Pack years: 45.00    Types: Cigarettes    Last attempt to quit: 05/23/2009    Years since quitting: 8.7  . Smokeless tobacco: Never Used  Substance and Sexual Activity  . Alcohol use: Not Currently  . Drug use: Not on file  . Sexual activity: Not on file  Lifestyle  . Physical activity:    Days per week: Not on file    Minutes per session: Not on file  . Stress: Not on file  Relationships  . Social connections:    Talks on phone: Not on file    Gets together: Not on file    Attends religious service: Not on file    Active member  of club or organization: Not on file    Attends meetings of clubs or organizations: Not on file    Relationship status: Not on file  . Intimate partner violence:    Fear of current or ex partner: Not on file    Emotionally abused: Not on file    Physically abused: Not on file    Forced sexual activity: Not on file  Other Topics Concern  . Not on file  Social History Narrative  . Not on file    Current Outpatient Medications  Medication Sig Dispense Refill  . aspirin EC 325 MG tablet Take 325 mg by mouth daily.    . Calcium Carb-Cholecalciferol (CALTRATE 600+D3 PO) Take 1 tablet by mouth 2 (two) times daily.    . furosemide (LASIX) 40 MG tablet Take 1 tablet (40 mg total) by mouth daily for 30 days. 30 tablet 0  . levothyroxine (SYNTHROID,  LEVOTHROID) 100 MCG tablet Take 100 mcg by mouth daily before breakfast.    . metoprolol tartrate (LOPRESSOR) 50 MG tablet Take 50 mg by mouth 2 (two) times daily.    . Multiple Vitamins-Minerals (MULTIVITAMIN ADULTS 50+) TABS Take 1 tablet by mouth daily.    . potassium chloride SA (K-DUR,KLOR-CON) 20 MEQ tablet Take 1 tablet (20 mEq total) by mouth daily for 30 days. 30 tablet 0  . simvastatin (ZOCOR) 80 MG tablet Take 80 mg by mouth every evening.     No current facility-administered medications for this visit.     Allergies  Allergen Reactions  . Adhesive [Tape] Other (See Comments)    Causes BLISTERS      Review of Systems:   General:  normal appetite, decreased energy, no weight gain, no weight loss, no fever  Cardiac:  + chest pain with exertion, no chest pain at rest, +SOB with exertion, + recent episode resting SOB, no PND, no orthopnea, no palpitations, no arrhythmia, no atrial fibrillation, + LE edema, + dizzy spells, + syncope  Respiratory:  + shortness of breath, no home oxygen, no productive cough, no dry cough, no bronchitis, no wheezing, no hemoptysis, no asthma, no pain with inspiration or cough, no sleep apnea, no CPAP at  night  GI:   no difficulty swallowing, no reflux, no frequent heartburn, no hiatal hernia, no abdominal pain, no constipation, no diarrhea, no hematochezia, no hematemesis, no melena  GU:   no dysuria,  no frequency, no urinary tract infection, no hematuria, no kidney stones, no kidney disease  Vascular:  no pain suggestive of claudication, no pain in feet, no leg cramps, no varicose veins, no DVT, no non-healing foot ulcer  Neuro:   + stroke, no TIA's, no seizures, no headaches, no temporary blindness one eye,  + slurred speech, no peripheral neuropathy, no chronic pain, + instability of gait, + memory/cognitive dysfunction  Musculoskeletal: + arthritis, no joint swelling, no myalgias, + some difficulty walking, limited mobility   Skin:   no rash, no itching, no skin infections, no pressure sores or ulcerations  Psych:   no anxiety, no depression, no nervousness, no unusual recent stress  Eyes:   no blurry vision, no floaters, no recent vision changes, + wears glasses or contacts  ENT:   no hearing loss, no loose or painful teeth, + partial dentures, last saw dentist earlier today  Hematologic:  no easy bruising, no abnormal bleeding, no clotting disorder, no frequent epistaxis  Endocrine:   diabetes, does not check CBG's at home           Physical Exam:   BP (!) 144/58   Pulse 62   Resp 20   Ht 5' 1.5" (1.562 m)   Wt 180 lb (81.6 kg)   SpO2 93% Comment: RA  BMI 33.46 kg/m   General:  Obese elderly female NAD    HEENT:  Unremarkable   Neck:   no JVD, no bruits, no adenopathy   Chest:   clear to auscultation, symmetrical breath sounds, no wheezes, no rhonchi   CV:   RRR, grade III/VI crescendo/decrescendo murmur heard best at RSB,  no diastolic murmur  Abdomen:  soft, non-tender, no masses   Extremities:  warm, well-perfused, pulses diminished, no LE edema  Rectal/GU  Deferred  Neuro:   Grossly non-focal and symmetrical throughout  Skin:   Clean and dry, no rashes, no  breakdown   Diagnostic Tests:  EKG: NSR w/RBBB     ECHOCARDIOGRAM  REPORT       Julie Mcgee Name:   Julie Mcgee Date of Exam: 02/20/2018 Medical Rec #:  [id number]     Height:       61.5 in Accession #:    [id number]    Weight:       188.9 lb Date of Birth:  1933/11/24     BSA:          1.85 m Julie Mcgee Age:    25 years      BP:           123/41 mmHg Julie Mcgee Gender: F             HR:           55 bpm. Exam Location:  Inpatient    Procedure: 2D Echo  Indications:    Cardiomegaly 429.3   History:        Julie Mcgee has prior history of Echocardiogram examinations, most                 recent 11/29/2013. Aortic Valve Disease; Risk Factors:                 Dyslipidemia, Hypertension and history of CVA.   Sonographer:    Johny Chess Referring Phys: [id number] [place]    1. The left ventricle has hyperdynamic systolic function of >89%. The cavity size was normal. There is mildly increased left ventricular wall thickness. Echo evidence of pseudonormalization in diastolic relaxation Elevated mean left atrial pressure.  2. The right ventricle has normal systolic function. The cavity was normal. There is no increase in right ventricular wall thickness.  3. Left atrial size was severely dilated.  4. The mitral valve is rheumatic. There is moderate thickening and moderate calcification. There is severe mitral annular calcification present. moderate mitral valve stenosis.  5. The tricuspid valve is normal in structure.  6. The aortic valve is tricuspid There is severe thickening and severe calcifcation of the aortic valve. Aortic valve regurgitation is mild to moderate by color flow Doppler. The calculated aortic valve area is 0.71 cm, consistent with severe stenosis.  7. The inferior vena cava was dilated in size with >50% respiratory variability.  8. AV is thickened, calcified with restricted motion. Peak and mean gradients through the valve are 69 and 44 mm Hg  repectively consistent with severe AS.  FINDINGS  Left Ventricle: The left ventricle has hyperdynamic systolic function of >38%. The cavity size was normal. There is mildly increased left ventricular wall thickness. Echo evidence of pseudonormalization in diastolic relaxation Elevated mean left atrial  pressure Right Ventricle: The right ventricle has normal systolic function. The cavity was normal. There is no increase in right ventricular wall thickness. Left Atrium: left atrial size was severely dilated Right Atrium: right atrial size was normal in size Interatrial Septum: No atrial level shunt detected by color flow Doppler.  Pericardium: There is no evidence of pericardial effusion. Mitral Valve: The mitral valve is rheumatic. There is moderate thickening and moderate calcification. There is severe mitral annular calcification present. Mitral valve regurgitation is mild by color flow Doppler. moderate mitral valve stenosis. Tricuspid Valve: The tricuspid valve is normal in structure. Tricuspid valve regurgitation is mild by color flow Doppler. Aortic Valve: The aortic valve is tricuspid There is severe thickening of the aortic valve. Aortic valve regurgitation is mild to moderate by color flow Doppler. The calculated aortic valve area is 0.71 cm, consistent with severe stenosis. Pulmonic Valve: The  pulmonic valve was grossly normal. Pulmonic valve regurgitation is not visualized by color flow Doppler. Venous: The inferior vena cava is dilated in size with greater than 50% respiratory variability. Additional Findings: AV is thickened, calcified with restricted motion. Peak and mean gradients through the valve are 69 and 44 mm Hg repectively consistent with severe AS.   LEFT VENTRICLE PLAX 2D (Teich) LV EF:          74.9 %   Diastology LVIDd:          4.48 cm  LV e' lateral:   5.33 cm/s LVIDs:          2.53 cm  LV E/e' lateral: 43.9 LV PW:          1.23 cm  LV e' medial:    2.55 cm/s LV  IVS:         0.83 cm  LV E/e' medial:  91.8 LVOT diam:      1.90 cm LV SV:          68 ml LVOT Area:      2.84 cm  RIGHT VENTRICLE RV S prime:     10.50 cm/s TAPSE (M-mode): 2.3 cm RVSP:           30.5 mmHg  LEFT ATRIUM              Index       RIGHT ATRIUM           Index LA diam:        4.90 cm  2.64 cm/m  RA Pressure: 3 mmHg LA Vol (A2C):   158.0 ml 85.19 ml/m RA Area:     19.40 cm LA Vol (A4C):   123.0 ml 66.32 ml/m RA Volume:   57.10 ml  30.79 ml/m LA Biplane Vol: 142.0 ml 76.56 ml/m  AORTIC VALVE AV Area (Vmax):    0.73 cm AV Area (Vmean):   0.70 cm AV Area (VTI):     0.71 cm AV Vmax:           414.00 cm/s AV Vmean:          315.500 cm/s AV VTI:            1.150 m AV Peak Grad:      68.6 mmHg AV Mean Grad:      43.5 mmHg LVOT Vmax:         106.00 cm/s LVOT Vmean:        77.600 cm/s LVOT VTI:          0.286 m LVOT/AV VTI ratio: 0.25 AR PHT:            420 msec   AORTA Ao Root diam: 2.80 cm  MITRAL VALVE               TR Peak grad: 27.5 mmHg MV Area (PHT): 1.46 cm    TR Vmax:      296.00 cm/s MV Mean grad:  9.0 mmHg    RVSP:         30.5 mmHg MV PHT:        150.22 msec MV Decel Time: 518 msec MR Peak grad: 171.1 mmHg MR Mean grad: 105.0 mmHg MR Vmax:      654.00 cm/s MR Vmean:     485.0 cm/s MV E velocity: 234.00 cm/s MV A velocity: 140.00 cm/s MV E/A ratio:  1.67    Dorris Carnes MD Electronically signed by Dorris Carnes MD Signature Date/Time: 02/20/2018/3:48:02 PM  RIGHT/LEFT HEART CATH AND CORONARY ANGIOGRAPHY  Conclusion   1.  Mild to moderate focal proximal LAD stenosis at the origin of the first diagonal branch, does not appear flow obstructive 2.  Severe calcific aortic stenosis with a peak to peak gradient of 60 mmHg, mean gradient 48 mmHg, and calculated valve area 0.96 cm. 3.  Severe mitral annular calcification biplane fluoroscopy with moderate mitral stenosis present, mean gradient 10 mmHg, calculated valve area 1.7 cm 4.  Normal  LVEDP, mild pulmonary hypertension, and mildly elevated right heart pressures  Recommendations: Medical therapy for nonobstructive CAD, continued evaluation for consideration of TAVR and treatment of this Julie Mcgee's valvular heart disease.  Anticipate moving forward with CTA studies of the heart and chest/abdomen/pelvis followed by cardiac surgical consultation as part of a multidisciplinary evaluation.  Indications   Severe aortic stenosis [I35.0 (ICD-10-CM)]  Procedural Details   Technical Details INDICATION: CHF, severe aortic stenosis, mitral stenosis  PROCEDURAL DETAILS: There was an indwelling IV in a right antecubital vein. Using normal sterile technique, the IV was changed out for a 5 Fr brachial sheath over a 0.018 inch wire. The right wrist was then prepped, draped, and anesthetized with 1% lidocaine. Using the modified Seldinger technique a 5/6 French Slender sheath was placed in the right radial artery. Intra-arterial verapamil was administered through the radial artery sheath. IV heparin was administered after a JR4 catheter was advanced into the central aorta. A Swan-Ganz catheter was used for the right heart catheterization. Standard protocol was followed for recording of right heart pressures and sampling of oxygen saturations. Fick cardiac output was calculated. Standard Judkins catheters were used for selective coronary angiography. LV pressure is recorded. Simultaneous wedge pressure and LV pressure is recorded to assess for mitral stenosis. An aortic valve pullback is performed. There were no immediate procedural complications. The Julie Mcgee was transferred to the post catheterization recovery area for further monitoring.    Estimated blood loss <50 mL.   During this procedure medications were administered to achieve and maintain moderate conscious sedation while the Julie Mcgee's heart rate, blood pressure, and oxygen saturation were continuously monitored and I was present  face-to-face 100% of this time.  Medications  (Filter: Administrations occurring from 02/21/18 0714 to 02/21/18 0844)  Medication Rate/Dose/Volume Action  Date Time   fentaNYL (SUBLIMAZE) injection (mcg) 25 mcg Given 02/21/18 0757   Total dose as of 02/27/18 1404        25 mcg        midazolam (VERSED) injection (mg) 1 mg Given 02/21/18 0757   Total dose as of 02/27/18 1404 1 mg Given 0816   2 mg        Heparin (Porcine) in NaCl 1000-0.9 UT/500ML-% SOLN (mL) 500 mL Given 02/21/18 0758   Total dose as of 02/27/18 1404 500 mL Given 0758   1,000 mL        lidocaine (PF) (XYLOCAINE) 1 % injection (mL) 2 mL Given 02/21/18 0808   Total dose as of 02/27/18 1404 2 mL Given 0809   4 mL        Radial Cocktail/Verapamil only (mL) 10 mL Given 02/21/18 0810   Total dose as of 02/27/18 1404        10 mL        heparin injection (Units) 4,000 Units Given 02/21/18 0818   Total dose as of 02/27/18 1404        4,000 Units        iohexol (OMNIPAQUE) 350 MG/ML injection (mL)  30 mL Given 02/21/18 0832   Total dose as of 02/27/18 1404        30 mL        aspirin EC tablet 325 mg (mg) *Not included in total [facility] Hold 02/21/18 0737   Dosing weight:  90.7 kg        Total dose as of 02/27/18 1404        Cannot be calculated        atorvastatin (LIPITOR) tablet 40 mg (mg) *Not included in total [facility] Hold 02/21/18 0737   Dosing weight:  90.7 kg        Total dose as of 02/27/18 1404        Cannot be calculated        enoxaparin (LOVENOX) injection 40 mg (mg) *Not included in total [facility] Hold 02/21/18 0737   Dosing weight:  90.7 kg        Total dose as of 02/27/18 1404        Cannot be calculated        furosemide (LASIX) injection 40 mg (mg) *Not included in total [facility] Hold 02/21/18 0737   Dosing weight:  86.9 kg *Not included in total [facility]d 0800   Total dose as of 02/27/18 1404        Cannot be calculated        levothyroxine (SYNTHROID, LEVOTHROID) tablet 100 mcg (mcg) *Not included in total  [facility] Hold 02/21/18 0737   Total dose as of 02/27/18 1404        Cannot be calculated        metoprolol tartrate (LOPRESSOR) tablet 50 mg (mg) *Not included in total [facility] Hold 02/21/18 0737   Dosing weight:  90.7 kg        Total dose as of 02/27/18 1404        Cannot be calculated        polyethylene glycol (MIRALAX / GLYCOLAX) packet 17 g (g) *Not included in total [facility] Hold 02/21/18 0737   Dosing weight:  90.7 kg        Total dose as of 02/27/18 1404        Cannot be calculated        traZODone (DESYREL) tablet 50 mg (mg) *Not included in total [facility] Hold 02/21/18 0737   Dosing weight:  85.7 kg        Total dose as of 02/27/18 1404        Cannot be calculated        Sedation Time   Sedation Time Physician-1: 30 minutes 23 seconds  Coronary Findings   Diagnostic  Dominance: Right  Left Main  Vessel is large. Vessel is angiographically normal.  Left Anterior Descending  Prox LAD lesion 40% stenosed  Prox LAD lesion is 40% stenosed. The lesion is discrete. The LAD is a large-caliber vessel. The vessel is widely patent proximally. Just at the origin of the first diagonal, there is a 40% stenosis present. This occurs in an area of marked angulation into the mid LAD. The remaining portions of the LAD are widely patent without stenosis. The first diagonal is widely patent without stenosis.  Left Circumflex  Vessel is normal in caliber. Vessel is angiographically normal. The circumflex is patent without significant stenosis. There are 2 OM branches without stenosis.  Right Coronary Artery  Vessel is large. The vessel exhibits minimal luminal irregularities. The RCA is a large, dominant vessel. The vessel has mild irregularities throughout. Distally, divides into the  PDA and posterior AV segment. The posterior AV segment supplies a large posterolateral branch with no significant stenosis. The PDA has no significant stenosis.  Intervention   No interventions have been documented.  Left Heart    Mitral Valve There is moderate mitral valve stenosis. The annulus is calcified.  Aortic Valve There is severe aortic valve stenosis. The aortic valve is calcified. There is restricted aortic valve motion.  Coronary Diagrams   Diagnostic  Dominance: Right    Intervention   Implants    No implant documentation for this case.  Syngo Images   Show images for CARDIAC CATHETERIZATION  MERGE Images   Show images for CARDIAC CATHETERIZATION   Link to Procedure Log   Procedure Log    Hemo Data    Most Recent Value  Fick Cardiac Output 6.06 L/min  Fick Cardiac Output Index 3.26 (L/min)/BSA  Aortic Mean Gradient 47.94 mmHg  Aortic Peak Gradient 60 mmHg  Aortic Valve Area 0.96  Aortic Value Area Index 0.52 cm2/BSA  Mitral Mean Gradient 10.47 mmHg  Mitral Peak Gradient 6 mmHg  Mitral Valve Area Index 0.9 cm2/BSA  RA A Wave 12 mmHg  RA V Wave 7 mmHg  RA Mean 5 mmHg  RV Systolic Pressure 43 mmHg  RV Diastolic Pressure 2 mmHg  RV EDP 10 mmHg  PA Systolic Pressure 45 mmHg  PA Diastolic Pressure 20 mmHg  PA Mean 30 mmHg  PW A Wave 26 mmHg  PW V Wave 31 mmHg  PW Mean 18 mmHg  AO Systolic Pressure 785 mmHg  AO Diastolic Pressure 39 mmHg  AO Mean 63 mmHg  LV Systolic Pressure 885 mmHg  LV Diastolic Pressure 4 mmHg  LV EDP 12 mmHg  AOp Systolic Pressure 027 mmHg  AOp Diastolic Pressure 40 mmHg  AOp Mean Pressure 64 mmHg  LVp Systolic Pressure 741 mmHg  LVp Diastolic Pressure 7 mmHg  LVp EDP Pressure 12 mmHg  QP/QS 1  TPVR Index 9.2 HRUI  TSVR Index 19.32 HRUI  PVR SVR Ratio 0.19  TPVR/TSVR Ratio 0.48    Cardiac TAVR CT  TECHNIQUE: The Julie Mcgee was scanned on a [employer]. A 120 kV retrospective scan was triggered in the descending thoracic aorta at 111 HU's. Gantry rotation speed was 250 msecs and collimation was .6 mm. No beta blockade or nitro were given. The 3D data set was reconstructed in 5% intervals of the R-R cycle. Systolic and diastolic  phases were analyzed on a dedicated work station using MPR, MIP and VRT modes. The Julie Mcgee received 80 cc of contrast.  FINDINGS: Aortic Valve: Trileaflet aortic valve with severely thickened, moderately calcified leaflets with severely restricted leaflet opening and mild asymmetric calcifications extending into the LVOT under the left coronary cusp.  Aorta: Normal size with moderate diffuse atherosclerotic disease and calcifications. No dissection.  Sinotubular Junction: 27 x 27 mm  Ascending Thoracic Aorta: 32 x 31 mm  Aortic Arch: 28 x 25 mm  Descending Thoracic Aorta: 22 x 22 mm  Sinus of Valsalva Measurements:  Non-coronary: 30 mm  Right -coronary: 31 mm  Left -coronary: 30 mm  Coronary Artery Height above Annulus:  Left Main: 14 mm  Right Coronary: 15 mm  Virtual Basal Annulus Measurements:  Maximum/Minimum Diameter: 24.4 x 19.9 mm  Mean Diameter: 21.4 mm  Perimeter: 69.6 mm  Area: 359 mm2  Optimum Fluoroscopic Angle for Delivery: RAO 8 CRA 7  IMPRESSION: 1. Trileaflet aortic valve with severely thickened, moderately calcified leaflets with severely restricted  leaflet opening and mild asymmetric calcifications extending into the LVOT under the left coronary cusp. Annular measurements suitable for delivery of a 23 mm Edwards-SAPIEN 3 valve.  2. Sufficient coronary to annulus distance.  3. Optimum Fluoroscopic Angle for Delivery: RAO 8 CRA 7.  4. No thrombus in the left atrial appendage.  5. Dilated pulmonary artery measuring 38 mm consistent with pulmonary hypertension.   Electronically Signed   By: Ena Dawley   On: 02/24/2018 15:02   CT ANGIOGRAPHY CHEST, ABDOMEN AND PELVIS  TECHNIQUE: Multidetector CT imaging through the chest, abdomen and pelvis was performed using the standard protocol during bolus administration of intravenous contrast. Multiplanar reconstructed images and MIPs were obtained and  reviewed to evaluate the vascular anatomy.  CONTRAST:  148mL ISOVUE-370 IOPAMIDOL (ISOVUE-370) INJECTION 76%  COMPARISON:  No priors.  FINDINGS: CTA CHEST FINDINGS  Cardiovascular: Heart size is enlarged with left atrial dilatation. There is no significant pericardial fluid, thickening or pericardial calcification. There is aortic atherosclerosis, as well as atherosclerosis of the great vessels of the mediastinum and the coronary arteries, including calcified atherosclerotic plaque in the left main, left anterior descending and right coronary arteries. Severe thickening calcification of the aortic valve. There is also severe thickening calcification of the mitral valve as well as the mitral annulus. Dilatation of the pulmonic trunk (3.8 cm in diameter).  Mediastinum/Lymph Nodes: Multiple prominent borderline enlarged and minimally enlarged mediastinal and bilateral hilar lymph nodes, measuring up to 1.7 cm in short axis in the right paratracheal nodal station. Small hiatal hernia.  Lungs/Pleura: No suspicious appearing pulmonary nodules or masses are noted. No acute consolidative airspace disease. No pleural effusions.  Musculoskeletal/Soft Tissues: Chronic appearing T12 compression fracture with 50% loss of anterior vertebral body height. There are no aggressive appearing lytic or blastic lesions noted in the visualized portions of the skeleton.  CTA ABDOMEN AND PELVIS FINDINGS  Hepatobiliary: No suspicious appearing cystic or solid hepatic lesions. No intra or extrahepatic biliary ductal dilatation. Gallbladder is normal in appearance.  Pancreas: No pancreatic mass. No pancreatic ductal dilatation. No pancreatic or peripancreatic fluid or inflammatory changes.  Spleen: Unremarkable.  Adrenals/Urinary Tract: Bilateral kidneys and bilateral adrenal glands are normal in appearance. No hydroureteronephrosis. Urinary bladder is partially obscured by beam  hardening artifact from the left hip arthroplasty, but is generally unremarkable in appearance.  Stomach/Bowel: Normal appearance of the stomach. No pathologic dilatation of small bowel or colon. The appendix is not confidently identified and may be surgically absent. Regardless, there are no inflammatory changes noted adjacent to the cecum to suggest the presence of an acute appendicitis at this time.  Vascular/Lymphatic: Aortic atherosclerosis, without evidence of aneurysm or dissection in the abdominal or pelvic vasculature. Vascular findings and measurements pertinent to potential TAVR procedure, as detailed below. No lymphadenopathy noted in the abdomen or pelvis.  Reproductive: Uterus and ovaries are atrophic.  Other: No significant volume of ascites.  No pneumoperitoneum.  Musculoskeletal: Status post left hip arthroplasty. Chronic appearing compression fracture of L1 with 20% loss of anterior vertebral body height. There are no aggressive appearing lytic or blastic lesions noted in the visualized portions of the skeleton.  VASCULAR MEASUREMENTS PERTINENT TO TAVR:  AORTA:  Minimal Aortic Diameter-13 x 13 mm  Severity of Aortic Calcification-severe  RIGHT PELVIS:  Right Common Iliac Artery -  Minimal Diameter-9.8 x 7.1 mm  Tortuosity-mild  Calcification-severe  Right External Iliac Artery -  Minimal Diameter-5.2 x 5.0 mm  Tortuosity-moderate  Calcification-moderate to severe  Right Common  Femoral Artery -  Minimal Diameter-8.2 x 7.6 mm  Tortuosity-mild  Calcification-moderate  LEFT PELVIS:  Left Common Iliac Artery -  Minimal Diameter-9.3 x 7.1 mm  Tortuosity-mild  Calcification-severe  Left External Iliac Artery -  Minimal Diameter-7.9 x 2.6 mm  Tortuosity-moderate  Calcification-moderate severe  Left Common Femoral Artery -  Minimal Diameter-7.5 x 8.0  mm  Tortuosity-mild  Calcification-moderate  Review of the MIP images confirms the above findings.  IMPRESSION: 1. Vascular findings and measurements pertinent to potential TAVR procedure, as detailed above. 2. Severe thickening calcification of the aortic valve, compatible with reported clinical history of severe aortic stenosis. 3. There is also severe thickening calcification of the mitral valve and mitral annulus. 4. Cardiomegaly with left atrial dilatation. 5. Dilatation of the pulmonic trunk (3.8 cm in diameter), concerning for pulmonary arterial hypertension. 6. Aortic atherosclerosis, in addition to left main and 2 vessel coronary artery disease. 7. Additional incidental findings, as above.   Electronically Signed   By: Vinnie Langton M.D.   On: 02/24/2018 11:04   Impression:  Julie Mcgee has stage D severe symptomatic aortic stenosis.  She describes a long history of symptoms of exertional shortness of breath and fatigue with recent hospitalization for acute symptoms at rest consistent with acute on chronic diastolic congestive heart failure, [place] Heart Association functional class IV.  Since hospital discharge she is doing better with symptoms occurring with moderate exertion, functional class III.  I have personally reviewed the Julie Mcgee's recent transthoracic echocardiogram, diagnostic cardiac catheterization, and CT angiograms.  Echocardiogram demonstrates the presence of normal left ventricular systolic function.  There is severe aortic stenosis.  The aortic valve is trileaflet.  There is severe thickening, calcification, and restricted leaflet mobility involving all 3 leaflets of the aortic valve.  Peak velocity across aortic valve measured greater than 4.1 m/s corresponding to mean transvalvular gradient greater than 40 mmHg.  There is at least mild aortic insufficiency.  Julie Mcgee also has severe calcification with restricted leaflet mobility involving both  leaflets of the mitral valve.  Mean transvalvular gradient across the mitral valve was estimated 10 mmHg by both echocardiogram and diagnostic cardiac catheterization.  She does not have significant mitral regurgitation.  Coronary angiography revealed moderate nonobstructive coronary artery disease.  Right heart pressures were mildly elevated.  I agree the Julie Mcgee would likely benefit from aortic valve replacement.  Risks associated with conventional surgery would be relatively high because of the Julie Mcgee's advanced age, previous stroke with significant residual deficit, limited mobility and functional status, mild cognitive impairment, and significant mitral stenosis which would likely require mitral valve replacement.  I would not consider this Julie Mcgee a candidate for conventional surgery.  Cardiac-gated CTA of the heart reveals anatomical characteristics consistent with aortic stenosis suitable for treatment by transcatheter aortic valve replacement without any significant complicating features and CTA of the aorta and iliac vessels demonstrate what appears to be adequate pelvic vascular access to facilitate a transfemoral approach.  Baseline EKG reveals sinus rhythm with right bundle branch block so the Julie Mcgee may be at somewhat increased risk for the development of complete heart block postoperatively.    Plan:  The Julie Mcgee and her family were counseled at length regarding treatment alternatives for management of severe symptomatic aortic stenosis. Alternative approaches such as conventional aortic valve replacement, transcatheter aortic valve replacement, and continued medical therapy without intervention were compared and contrasted at length.  The risks associated with conventional surgical aortic valve replacement were discussed in detail, as were expectations for  post-operative convalescence, and why I would be reluctant to consider this Julie Mcgee a candidate for conventional surgery.  Issues specific  to transcatheter aortic valve replacement were discussed including questions about long term valve durability, the potential for paravalvular leak, possible increased risk of need for permanent pacemaker placement, and other technical complications related to the procedure itself.  Long-term prognosis with medical therapy was discussed. This discussion was placed in the context of the Julie Mcgee's own specific clinical presentation and past medical history.  All of their questions have been addressed.  The Julie Mcgee desires to proceed with transcatheter aortic valve replacement.  Following the decision to proceed with transcatheter aortic valve replacement, a discussion has been held regarding what types of management strategies would be attempted intraoperatively in the event of life-threatening complications, including whether or not the Julie Mcgee would be considered a candidate for the use of cardiopulmonary bypass and/or conversion to open sternotomy for attempted surgical intervention.  The Julie Mcgee has been advised of a variety of complications that might develop including but not limited to risks of death, stroke, paravalvular leak, aortic dissection or other major vascular complications, aortic annulus rupture, device embolization, cardiac rupture or perforation, mitral regurgitation, acute myocardial infarction, arrhythmia, heart block or bradycardia requiring permanent pacemaker placement, congestive heart failure, respiratory failure, renal failure, pneumonia, infection, other late complications related to structural valve deterioration or migration, or other complications that might ultimately cause a temporary or permanent loss of functional independence or other long term morbidity.  The Julie Mcgee provides full informed consent for the procedure as described and all questions were answered.   I spent in excess of 90 minutes during the conduct of this office consultation and >50% of this time involved direct  face-to-face encounter with the Julie Mcgee for counseling and/or coordination of their care.     Valentina Gu. Roxy Manns, MD 02/27/2018 1:11 PM

## 2018-02-27 NOTE — Anesthesia Preprocedure Evaluation (Addendum)
Anesthesia Evaluation  Patient identified by MRN, date of birth, ID band Patient awake    Reviewed: Allergy & Precautions, H&P , NPO status , Patient's Chart, lab work & pertinent test results, reviewed documented beta blocker date and time   Airway Mallampati: II  TM Distance: >3 FB Neck ROM: Full    Dental no notable dental hx. (+) Partial Upper, Dental Advisory Given   Pulmonary neg pulmonary ROS, former smoker,    Pulmonary exam normal breath sounds clear to auscultation       Cardiovascular Exercise Tolerance: Good hypertension, Pt. on medications and Pt. on home beta blockers + Peripheral Vascular Disease and +CHF  negative cardio ROS  + Valvular Problems/Murmurs AS  Rhythm:Regular Rate:Normal + Systolic murmurs    Neuro/Psych negative neurological ROS  negative psych ROS   GI/Hepatic negative GI ROS, Neg liver ROS,   Endo/Other  negative endocrine ROSHypothyroidism   Renal/GU negative Renal ROS  negative genitourinary   Musculoskeletal  (+) Arthritis ,   Abdominal   Peds  Hematology negative hematology ROS (+)   Anesthesia Other Findings   Reproductive/Obstetrics negative OB ROS                            Anesthesia Physical Anesthesia Plan  ASA: IV  Anesthesia Plan: MAC   Post-op Pain Management:    Induction: Intravenous  PONV Risk Score and Plan: 3 and Ondansetron, Propofol infusion and Dexamethasone  Airway Management Planned: Simple Face Mask  Additional Equipment: Arterial line, CVP and Ultrasound Guidance Line Placement  Intra-op Plan:   Post-operative Plan:   Informed Consent: I have reviewed the patients History and Physical, chart, labs and discussed the procedure including the risks, benefits and alternatives for the proposed anesthesia with the patient or authorized representative who has indicated his/her understanding and acceptance.     Dental  advisory given  Plan Discussed with: CRNA  Anesthesia Plan Comments:         Anesthesia Quick Evaluation

## 2018-02-27 NOTE — Pre-Procedure Instructions (Signed)
Joannah Gitlin  02/27/2018      [id number] - [place], [place] - [address] [place] [address] Phone: [phone] Fax: [phone]  [facility] - [place], [place] [address] Phone: [phone] Fax: [phone]    Your procedure is scheduled on Tuesday 02/28/2018.  Report to [facility] Entr[facility] "[facility]" Admitting Office at 1015 A.M.  Call this number if you have problems the morning of surgery:  [phone]   Remember:  Do not eat or drink after midnight tonight.    Take these medicines the morning of surgery with A SIP OF WATER: NONE    Do not wear jewelry, make-up or nail polish.  Do not wear lotions, powders, or perfumes, or deodorant.  Do not shave 48 hours prior to surgery.    Do not bring valuables to the hospital.  [facility] is not responsible for any belongings or valuables.  Contacts, Eyeglasses, Hearing Aids, dentures or bridgework may not be worn into surgery.  Leave your suitcase in the car.  After surgery it may be brought to your room.  For patients admitted to the hospital, discharge time will be determined by your treatment team.  Patients discharged the day of surgery will not be allowed to drive home.   Name and phone number of your driver:    Special instructions:   [place]- Preparing For Surgery  Before surgery, you can play an important role. Because skin is not sterile, your skin needs to be as free of germs as possible. You can reduce the number of germs on your skin by washing with CHG (chlorahexidine gluconate) Soap before surgery.  CHG is an antiseptic cleaner which kills germs and bonds with the skin to continue killing germs even after washing.    Oral Hygiene is also important to reduce your risk of infection.  Remember - BRUSH YOUR TEETH THE MORNING OF SURGERY WITH YOUR REGULAR TOOTHPASTE  Please do not use if you  have an allergy to CHG or antibacterial soaps. If your skin becomes reddened/irritated stop using the CHG.  Do not shave (including legs and underarms) for at least 48 hours prior to first CHG shower. It is OK to shave your face.  Please follow these instructions carefully.   1. Shower the NIGHT BEFORE SURGERY and the MORNING OF SURGERY with CHG.   2. If you chose to wash your hair, wash your hair first as usual with your normal shampoo.  3. After you shampoo, rinse your hair and body thoroughly to remove the shampoo.  4. Use CHG as you would any other liquid soap. You can apply CHG directly to the skin and wash gently with a scrungie or a clean washcloth.   5. Apply the CHG Soap to your body ONLY FROM THE NECK DOWN.  Do not use on open wounds or open sores. Avoid contact with your eyes, ears, mouth and genitals (private parts). Wash Face and genitals (private parts)  with your normal soap.  6. Wash thoroughly, paying special attention to the area where your surgery will be performed.  7. Thoroughly rinse your body with warm water from the neck down.  8. DO NOT shower/wash with your normal soap after using and rinsing off the CHG Soap.  9. Pat yourself dry with a CLEAN TOWEL.  10. Wear CLEAN PAJAMAS to bed the night before surgery,  wear comfortable clothes the morning of surgery  11. Place CLEAN SHEETS on your bed the night of your first shower and DO NOT SLEEP WITH PETS.    Day of Surgery: Shower as stated above. Do not apply any deodorants/lotions.  Please wear clean clothes to the hospital/surgery center.   Remember to brush your teeth WITH YOUR REGULAR TOOTHPASTE.    Please read over the following fact sheets that you were given. Pain Booklet, Coughing and Deep Breathing, Blood Transfusion Information, MRSA Information and Surgical Site Infection Prevention

## 2018-02-27 NOTE — Patient Instructions (Signed)
   Continue taking all current medications without change through the day before surgery.  Have nothing to eat or drink after midnight the night before surgery.  On the morning of surgery take only Synthroid with a sip of water.     

## 2018-02-27 NOTE — Progress Notes (Addendum)
[facility]  MULTIDISCIPLINARY HEART VALVE CLINIC  CARDIOTHORACIC SURGERY CONSULTATION REPORT  Referring Provider is Sueanne Margarita, MD  Primary Cardiologist is Revankar, Sunny Schlein, MD PCP is Masneri, Adele Barthel, DO  Chief Complaint  Patient presents with  . Aortic Stenosis    TAVR, review all studies    HPI:  Patient is an 85 year old female with history of aortic stenosis, mitral stenosis, hypertension, previous stroke with residual right hemiparesis, right bundle branch block, and hyperlipidemia who has been referred for surgical consultation to discuss treatment options for management of severe symptomatic aortic stenosis.  Patient states she has known of presence of a heart murmur for many years.  She suffered a massive stroke in 2011 which left her with residual right-sided hemiparesis.  She apparently had a prolonged rehabilitation.  She was followed for aortic stenosis by a local cardiologist, and approximately 1 year ago she moved to [place] to live near her son.  She has been followed by Dr. Geraldo Pitter since May 2019.  She admits to a long history of exertional shortness of breath.    In October 2019 she had a syncopal episode for which she was evaluated in the emergency department.  Her symptoms were attributed to orthostatic hypotension.  She was not seen by cardiology at that time.    Symptoms of shortness of breath rapidly progressed over the last few weeks culminating the patient's hospital admission with resting shortness of breath, chest discomfort, and dizziness without syncope.  BNP level was mildly elevated and chest x-ray revealed pulmonary vascular congestion.  She was hospitalized for intravenous diuresis.  Transthoracic echocardiogram performed February 20, 2018 revealed normal left ventricular systolic function with moderate mitral stenosis and severe aortic stenosis with mild aortic insufficiency.  Peak velocity across the aortic valve was measured  4.1 m/s corresponding to mean transvalvular gradient estimated 43 mmHg.  DVI was 0.25 with aortic valve area calculated 0.7 cm.  Mean transvalvular gradient across mitral valve was estimated 9.0 mmHg corresponding to the mitral valve area estimated 1.46 cm using pressure half-time.  Left ventricular function appeared normal with ejection fraction estimated greater than 65%.  The patient was seen in consultation by Dr. Burt Knack and underwent left and right heart catheterization on February 21, 2018.  This revealed mild to moderate nonobstructive coronary artery disease.  There was severe aortic stenosis with peak to peak and mean transvalvular gradients measured 60 and 48 mmHg respectively.  Aortic valve area was calculated 0.96 cm.  The mitral valve was severely calcified.  Mean transvalvular gradient across the mitral valve was measured 10 mmHg corresponding to mitral valve area calculated 1.7 cm.  There was mild pulmonary hypertension and mildly elevated right heart pressures.  Patient was discharged from the hospital and underwent CT angiography.  She was seen by a dentist last week.  She has been cleared for surgical intervention.  Patient is married and lives locally in [place] with her husband.  Her son lives several miles away and is very supportive and accompanies her for her office consultation visit today.  Patient has significant residual right-sided hemiparesis and unstable gait dating back to her stroke in 2011.  She walks using a rolling walker for support.  She has had several mechanical falls.  She gets short of breath with exertion.  At the time of her recent hospitalization she had resting shortness of breath and chest discomfort.  The symptoms resolved with intravenous diuresis.  She has mild memory problems with impaired short-term  memory.  She does not drive an automobile.  She is able to get around acceptably well at home but her mobility is quite limited.  Past Medical History:    Diagnosis Date  . Anticoagulant long-term use   . Arthritis   . Carotid artery stenosis   . CVA (cerebral vascular accident) ([place])   . Former smoker    1/2 ppd for 45 years  . Hyperlipidemia   . Hypertension   . Hyponatremia   . Hypothyroidism   . Peripheral vascular disease ([place])   . RBBB     Past Surgical History:  Procedure Laterality Date  . BASAL CELL CARCINOMA EXCISION  2018  . COLONOSCOPY  2001  . HIP SURGERY  2005  . KNEE SURGERY  1998  . RIGHT/LEFT HEART CATH AND CORONARY ANGIOGRAPHY N/A 02/21/2018   Procedure: RIGHT/LEFT HEART CATH AND CORONARY ANGIOGRAPHY;  Surgeon: Sherren Mocha, MD;  Location: [facility];  Service: Cardiovascular;  Laterality: N/A;    Family History  Problem Relation Age of Onset  . Heart failure Mother   . Chronic Renal Failure Mother   . Alcohol abuse Father     Social History   Socioeconomic History  . Marital status: Married    Spouse name: Not on file  . Number of children: Not on file  . Years of education: Not on file  . Highest education level: Not on file  Occupational History  . Not on file  Social Needs  . Financial resource strain: Not on file  . Food insecurity:    Worry: Not on file    Inability: Not on file  . Transportation needs:    Medical: Not on file    Non-medical: Not on file  Tobacco Use  . Smoking status: Former Smoker    Packs/day: 1.00    Years: 45.00    Pack years: 45.00    Types: Cigarettes    Last attempt to quit: 05/23/2009    Years since quitting: 8.7  . Smokeless tobacco: Never Used  Substance and Sexual Activity  . Alcohol use: Not Currently  . Drug use: Not on file  . Sexual activity: Not on file  Lifestyle  . Physical activity:    Days per week: Not on file    Minutes per session: Not on file  . Stress: Not on file  Relationships  . Social connections:    Talks on phone: Not on file    Gets together: Not on file    Attends religious service: Not on file    Active member  of club or organization: Not on file    Attends meetings of clubs or organizations: Not on file    Relationship status: Not on file  . Intimate partner violence:    Fear of current or ex partner: Not on file    Emotionally abused: Not on file    Physically abused: Not on file    Forced sexual activity: Not on file  Other Topics Concern  . Not on file  Social History Narrative  . Not on file    Current Outpatient Medications  Medication Sig Dispense Refill  . aspirin EC 325 MG tablet Take 325 mg by mouth daily.    . Calcium Carb-Cholecalciferol (CALTRATE 600+D3 PO) Take 1 tablet by mouth 2 (two) times daily.    . furosemide (LASIX) 40 MG tablet Take 1 tablet (40 mg total) by mouth daily for 30 days. 30 tablet 0  . levothyroxine (SYNTHROID,  LEVOTHROID) 100 MCG tablet Take 100 mcg by mouth daily before breakfast.    . metoprolol tartrate (LOPRESSOR) 50 MG tablet Take 50 mg by mouth 2 (two) times daily.    . Multiple Vitamins-Minerals (MULTIVITAMIN ADULTS 50+) TABS Take 1 tablet by mouth daily.    . potassium chloride SA (K-DUR,KLOR-CON) 20 MEQ tablet Take 1 tablet (20 mEq total) by mouth daily for 30 days. 30 tablet 0  . simvastatin (ZOCOR) 80 MG tablet Take 80 mg by mouth every evening.     No current facility-administered medications for this visit.     Allergies  Allergen Reactions  . Adhesive [Tape] Other (See Comments)    Causes BLISTERS      Review of Systems:   General:  normal appetite, decreased energy, no weight gain, no weight loss, no fever  Cardiac:  + chest pain with exertion, no chest pain at rest, +SOB with exertion, + recent episode resting SOB, no PND, no orthopnea, no palpitations, no arrhythmia, no atrial fibrillation, + LE edema, + dizzy spells, + syncope  Respiratory:  + shortness of breath, no home oxygen, no productive cough, no dry cough, no bronchitis, no wheezing, no hemoptysis, no asthma, no pain with inspiration or cough, no sleep apnea, no CPAP at  night  GI:   no difficulty swallowing, no reflux, no frequent heartburn, no hiatal hernia, no abdominal pain, no constipation, no diarrhea, no hematochezia, no hematemesis, no melena  GU:   no dysuria,  no frequency, no urinary tract infection, no hematuria, no kidney stones, no kidney disease  Vascular:  no pain suggestive of claudication, no pain in feet, no leg cramps, no varicose veins, no DVT, no non-healing foot ulcer  Neuro:   + stroke, no TIA's, no seizures, no headaches, no temporary blindness one eye,  + slurred speech, no peripheral neuropathy, no chronic pain, + instability of gait, + memory/cognitive dysfunction  Musculoskeletal: + arthritis, no joint swelling, no myalgias, + some difficulty walking, limited mobility   Skin:   no rash, no itching, no skin infections, no pressure sores or ulcerations  Psych:   no anxiety, no depression, no nervousness, no unusual recent stress  Eyes:   no blurry vision, no floaters, no recent vision changes, + wears glasses or contacts  ENT:   no hearing loss, no loose or painful teeth, + partial dentures, last saw dentist earlier today  Hematologic:  no easy bruising, no abnormal bleeding, no clotting disorder, no frequent epistaxis  Endocrine:   diabetes, does not check CBG's at home           Physical Exam:   BP (!) 144/58   Pulse 62   Resp 20   Ht 5' 1.5" (1.562 m)   Wt 180 lb (81.6 kg)   SpO2 93% Comment: RA  BMI 33.46 kg/m   General:  Obese elderly female NAD    HEENT:  Unremarkable   Neck:   no JVD, no bruits, no adenopathy   Chest:   clear to auscultation, symmetrical breath sounds, no wheezes, no rhonchi   CV:   RRR, grade III/VI crescendo/decrescendo murmur heard best at RSB,  no diastolic murmur  Abdomen:  soft, non-tender, no masses   Extremities:  warm, well-perfused, pulses diminished, no LE edema  Rectal/GU  Deferred  Neuro:   Grossly non-focal and symmetrical throughout  Skin:   Clean and dry, no rashes, no  breakdown   Diagnostic Tests:  EKG: NSR w/RBBB     ECHOCARDIOGRAM  REPORT       Patient Name:   Julie Mcgee Date of Exam: 02/20/2018 Medical Rec #:  [id number]     Height:       61.5 in Accession #:    [id number]    Weight:       188.9 lb Date of Birth:  November 29, 1933     BSA:          1.85 m Patient Age:    41 years      BP:           123/41 mmHg Patient Gender: F             HR:           55 bpm. Exam Location:  Inpatient    Procedure: 2D Echo  Indications:    Cardiomegaly 429.3   History:        Patient has prior history of Echocardiogram examinations, most                 recent 11/29/2013. Aortic Valve Disease; Risk Factors:                 Dyslipidemia, Hypertension and history of CVA.   Sonographer:    Johny Chess Referring Phys: [id number] Ivor    1. The left ventricle has hyperdynamic systolic function of >22%. The cavity size was normal. There is mildly increased left ventricular wall thickness. Echo evidence of pseudonormalization in diastolic relaxation Elevated mean left atrial pressure.  2. The right ventricle has normal systolic function. The cavity was normal. There is no increase in right ventricular wall thickness.  3. Left atrial size was severely dilated.  4. The mitral valve is rheumatic. There is moderate thickening and moderate calcification. There is severe mitral annular calcification present. moderate mitral valve stenosis.  5. The tricuspid valve is normal in structure.  6. The aortic valve is tricuspid There is severe thickening and severe calcifcation of the aortic valve. Aortic valve regurgitation is mild to moderate by color flow Doppler. The calculated aortic valve area is 0.71 cm, consistent with severe stenosis.  7. The inferior vena cava was dilated in size with >50% respiratory variability.  8. AV is thickened, calcified with restricted motion. Peak and mean gradients through the valve are 69 and 44 mm Hg  repectively consistent with severe AS.  FINDINGS  Left Ventricle: The left ventricle has hyperdynamic systolic function of >02%. The cavity size was normal. There is mildly increased left ventricular wall thickness. Echo evidence of pseudonormalization in diastolic relaxation Elevated mean left atrial  pressure Right Ventricle: The right ventricle has normal systolic function. The cavity was normal. There is no increase in right ventricular wall thickness. Left Atrium: left atrial size was severely dilated Right Atrium: right atrial size was normal in size Interatrial Septum: No atrial level shunt detected by color flow Doppler.  Pericardium: There is no evidence of pericardial effusion. Mitral Valve: The mitral valve is rheumatic. There is moderate thickening and moderate calcification. There is severe mitral annular calcification present. Mitral valve regurgitation is mild by color flow Doppler. moderate mitral valve stenosis. Tricuspid Valve: The tricuspid valve is normal in structure. Tricuspid valve regurgitation is mild by color flow Doppler. Aortic Valve: The aortic valve is tricuspid There is severe thickening of the aortic valve. Aortic valve regurgitation is mild to moderate by color flow Doppler. The calculated aortic valve area is 0.71 cm, consistent with severe stenosis. Pulmonic Valve: The  pulmonic valve was grossly normal. Pulmonic valve regurgitation is not visualized by color flow Doppler. Venous: The inferior vena cava is dilated in size with greater than 50% respiratory variability. Additional Findings: AV is thickened, calcified with restricted motion. Peak and mean gradients through the valve are 69 and 44 mm Hg repectively consistent with severe AS.   LEFT VENTRICLE PLAX 2D (Teich) LV EF:          74.9 %   Diastology LVIDd:          4.48 cm  LV e' lateral:   5.33 cm/s LVIDs:          2.53 cm  LV E/e' lateral: 43.9 LV PW:          1.23 cm  LV e' medial:    2.55 cm/s LV  IVS:         0.83 cm  LV E/e' medial:  91.8 LVOT diam:      1.90 cm LV SV:          68 ml LVOT Area:      2.84 cm  RIGHT VENTRICLE RV S prime:     10.50 cm/s TAPSE (M-mode): 2.3 cm RVSP:           30.5 mmHg  LEFT ATRIUM              Index       RIGHT ATRIUM           Index LA diam:        4.90 cm  2.64 cm/m  RA Pressure: 3 mmHg LA Vol (A2C):   158.0 ml 85.19 ml/m RA Area:     19.40 cm LA Vol (A4C):   123.0 ml 66.32 ml/m RA Volume:   57.10 ml  30.79 ml/m LA Biplane Vol: 142.0 ml 76.56 ml/m  AORTIC VALVE AV Area (Vmax):    0.73 cm AV Area (Vmean):   0.70 cm AV Area (VTI):     0.71 cm AV Vmax:           414.00 cm/s AV Vmean:          315.500 cm/s AV VTI:            1.150 m AV Peak Grad:      68.6 mmHg AV Mean Grad:      43.5 mmHg LVOT Vmax:         106.00 cm/s LVOT Vmean:        77.600 cm/s LVOT VTI:          0.286 m LVOT/AV VTI ratio: 0.25 AR PHT:            420 msec   AORTA Ao Root diam: 2.80 cm  MITRAL VALVE               TR Peak grad: 27.5 mmHg MV Area (PHT): 1.46 cm    TR Vmax:      296.00 cm/s MV Mean grad:  9.0 mmHg    RVSP:         30.5 mmHg MV PHT:        150.22 msec MV Decel Time: 518 msec MR Peak grad: 171.1 mmHg MR Mean grad: 105.0 mmHg MR Vmax:      654.00 cm/s MR Vmean:     485.0 cm/s MV E velocity: 234.00 cm/s MV A velocity: 140.00 cm/s MV E/A ratio:  1.67    Dorris Carnes MD Electronically signed by Dorris Carnes MD Signature Date/Time: 02/20/2018/3:48:02 PM  RIGHT/LEFT HEART CATH AND CORONARY ANGIOGRAPHY  Conclusion   1.  Mild to moderate focal proximal LAD stenosis at the origin of the first diagonal branch, does not appear flow obstructive 2.  Severe calcific aortic stenosis with a peak to peak gradient of 60 mmHg, mean gradient 48 mmHg, and calculated valve area 0.96 cm. 3.  Severe mitral annular calcification biplane fluoroscopy with moderate mitral stenosis present, mean gradient 10 mmHg, calculated valve area 1.7 cm 4.  Normal  LVEDP, mild pulmonary hypertension, and mildly elevated right heart pressures  Recommendations: Medical therapy for nonobstructive CAD, continued evaluation for consideration of TAVR and treatment of this patient's valvular heart disease.  Anticipate moving forward with CTA studies of the heart and chest/abdomen/pelvis followed by cardiac surgical consultation as part of a multidisciplinary evaluation.  Indications   Severe aortic stenosis [I35.0 (ICD-10-CM)]  Procedural Details   Technical Details INDICATION: CHF, severe aortic stenosis, mitral stenosis  PROCEDURAL DETAILS: There was an indwelling IV in a right antecubital vein. Using normal sterile technique, the IV was changed out for a 5 Fr brachial sheath over a 0.018 inch wire. The right wrist was then prepped, draped, and anesthetized with 1% lidocaine. Using the modified Seldinger technique a 5/6 French Slender sheath was placed in the right radial artery. Intra-arterial verapamil was administered through the radial artery sheath. IV heparin was administered after a JR4 catheter was advanced into the central aorta. A Swan-Ganz catheter was used for the right heart catheterization. Standard protocol was followed for recording of right heart pressures and sampling of oxygen saturations. Fick cardiac output was calculated. Standard Judkins catheters were used for selective coronary angiography. LV pressure is recorded. Simultaneous wedge pressure and LV pressure is recorded to assess for mitral stenosis. An aortic valve pullback is performed. There were no immediate procedural complications. The patient was transferred to the post catheterization recovery area for further monitoring.    Estimated blood loss <50 mL.   During this procedure medications were administered to achieve and maintain moderate conscious sedation while the patient's heart rate, blood pressure, and oxygen saturation were continuously monitored and I was present  face-to-face 100% of this time.  Medications  (Filter: Administrations occurring from 02/21/18 0714 to 02/21/18 0844)  Medication Rate/Dose/Volume Action  Date Time   fentaNYL (SUBLIMAZE) injection (mcg) 25 mcg Given 02/21/18 0757   Total dose as of 02/27/18 1404        25 mcg        midazolam (VERSED) injection (mg) 1 mg Given 02/21/18 0757   Total dose as of 02/27/18 1404 1 mg Given 0816   2 mg        Heparin (Porcine) in NaCl 1000-0.9 UT/500ML-% SOLN (mL) 500 mL Given 02/21/18 0758   Total dose as of 02/27/18 1404 500 mL Given 0758   1,000 mL        lidocaine (PF) (XYLOCAINE) 1 % injection (mL) 2 mL Given 02/21/18 0808   Total dose as of 02/27/18 1404 2 mL Given 0809   4 mL        Radial Cocktail/Verapamil only (mL) 10 mL Given 02/21/18 0810   Total dose as of 02/27/18 1404        10 mL        heparin injection (Units) 4,000 Units Given 02/21/18 0818   Total dose as of 02/27/18 1404        4,000 Units        iohexol (OMNIPAQUE) 350 MG/ML injection (mL)  30 mL Given 02/21/18 0832   Total dose as of 02/27/18 1404        30 mL        aspirin EC tablet 325 mg (mg) *Not included in total [facility] Hold 02/21/18 0737   Dosing weight:  90.7 kg        Total dose as of 02/27/18 1404        Cannot be calculated        atorvastatin (LIPITOR) tablet 40 mg (mg) *Not included in total [facility] Hold 02/21/18 0737   Dosing weight:  90.7 kg        Total dose as of 02/27/18 1404        Cannot be calculated        enoxaparin (LOVENOX) injection 40 mg (mg) *Not included in total [facility] Hold 02/21/18 0737   Dosing weight:  90.7 kg        Total dose as of 02/27/18 1404        Cannot be calculated        furosemide (LASIX) injection 40 mg (mg) *Not included in total [facility] Hold 02/21/18 0737   Dosing weight:  86.9 kg *Not included in total [facility] 0800   Total dose as of 02/27/18 1404        Cannot be calculated        levothyroxine (SYNTHROID, LEVOTHROID) tablet 100 mcg (mcg) *Not included in total  [facility] Hold 02/21/18 0737   Total dose as of 02/27/18 1404        Cannot be calculated        metoprolol tartrate (LOPRESSOR) tablet 50 mg (mg) *Not included in total [facility] Hold 02/21/18 0737   Dosing weight:  90.7 kg        Total dose as of 02/27/18 1404        Cannot be calculated        polyethylene glycol (MIRALAX / GLYCOLAX) packet 17 g (g) *Not included in total [facility] Hold 02/21/18 0737   Dosing weight:  90.7 kg        Total dose as of 02/27/18 1404        Cannot be calculated        traZODone (DESYREL) tablet 50 mg (mg) *Not included in total [facility] Hold 02/21/18 0737   Dosing weight:  85.7 kg        Total dose as of 02/27/18 1404        Cannot be calculated        Sedation Time   Sedation Time Physician-1: 30 minutes 23 seconds  Coronary Findings   Diagnostic  Dominance: Right  Left Main  Vessel is large. Vessel is angiographically normal.  Left Anterior Descending  Prox LAD lesion 40% stenosed  Prox LAD lesion is 40% stenosed. The lesion is discrete. The LAD is a large-caliber vessel. The vessel is widely patent proximally. Just at the origin of the first diagonal, there is a 40% stenosis present. This occurs in an area of marked angulation into the mid LAD. The remaining portions of the LAD are widely patent without stenosis. The first diagonal is widely patent without stenosis.  Left Circumflex  Vessel is normal in caliber. Vessel is angiographically normal. The circumflex is patent without significant stenosis. There are 2 OM branches without stenosis.  Right Coronary Artery  Vessel is large. The vessel exhibits minimal luminal irregularities. The RCA is a large, dominant vessel. The vessel has mild irregularities throughout. Distally, divides into the  PDA and posterior AV segment. The posterior AV segment supplies a large posterolateral branch with no significant stenosis. The PDA has no significant stenosis.  Intervention   No interventions have been documented.  Left Heart    Mitral Valve There is moderate mitral valve stenosis. The annulus is calcified.  Aortic Valve There is severe aortic valve stenosis. The aortic valve is calcified. There is restricted aortic valve motion.  Coronary Diagrams   Diagnostic  Dominance: Right    Intervention   Implants    No implant documentation for this case.  Syngo Images   Show images for CARDIAC CATHETERIZATION  MERGE Images   Show images for CARDIAC CATHETERIZATION   Link to Procedure Log   Procedure Log    Hemo Data    Most Recent Value  Fick Cardiac Output 6.06 L/min  Fick Cardiac Output Index 3.26 (L/min)/BSA  Aortic Mean Gradient 47.94 mmHg  Aortic Peak Gradient 60 mmHg  Aortic Valve Area 0.96  Aortic Value Area Index 0.52 cm2/BSA  Mitral Mean Gradient 10.47 mmHg  Mitral Peak Gradient 6 mmHg  Mitral Valve Area Index 0.9 cm2/BSA  RA A Wave 12 mmHg  RA V Wave 7 mmHg  RA Mean 5 mmHg  RV Systolic Pressure 43 mmHg  RV Diastolic Pressure 2 mmHg  RV EDP 10 mmHg  PA Systolic Pressure 45 mmHg  PA Diastolic Pressure 20 mmHg  PA Mean 30 mmHg  PW A Wave 26 mmHg  PW V Wave 31 mmHg  PW Mean 18 mmHg  AO Systolic Pressure 016 mmHg  AO Diastolic Pressure 39 mmHg  AO Mean 63 mmHg  LV Systolic Pressure 010 mmHg  LV Diastolic Pressure 4 mmHg  LV EDP 12 mmHg  AOp Systolic Pressure 932 mmHg  AOp Diastolic Pressure 40 mmHg  AOp Mean Pressure 64 mmHg  LVp Systolic Pressure 355 mmHg  LVp Diastolic Pressure 7 mmHg  LVp EDP Pressure 12 mmHg  QP/QS 1  TPVR Index 9.2 HRUI  TSVR Index 19.32 HRUI  PVR SVR Ratio 0.19  TPVR/TSVR Ratio 0.48    Cardiac TAVR CT  TECHNIQUE: The patient was scanned on a [employer]. A 120 kV retrospective scan was triggered in the descending thoracic aorta at 111 HU's. Gantry rotation speed was 250 msecs and collimation was .6 mm. No beta blockade or nitro were given. The 3D data set was reconstructed in 5% intervals of the R-R cycle. Systolic and diastolic  phases were analyzed on a dedicated work station using MPR, MIP and VRT modes. The patient received 80 cc of contrast.  FINDINGS: Aortic Valve: Trileaflet aortic valve with severely thickened, moderately calcified leaflets with severely restricted leaflet opening and mild asymmetric calcifications extending into the LVOT under the left coronary cusp.  Aorta: Normal size with moderate diffuse atherosclerotic disease and calcifications. No dissection.  Sinotubular Junction: 27 x 27 mm  Ascending Thoracic Aorta: 32 x 31 mm  Aortic Arch: 28 x 25 mm  Descending Thoracic Aorta: 22 x 22 mm  Sinus of Valsalva Measurements:  Non-coronary: 30 mm  Right -coronary: 31 mm  Left -coronary: 30 mm  Coronary Artery Height above Annulus:  Left Main: 14 mm  Right Coronary: 15 mm  Virtual Basal Annulus Measurements:  Maximum/Minimum Diameter: 24.4 x 19.9 mm  Mean Diameter: 21.4 mm  Perimeter: 69.6 mm  Area: 359 mm2  Optimum Fluoroscopic Angle for Delivery: RAO 8 CRA 7  IMPRESSION: 1. Trileaflet aortic valve with severely thickened, moderately calcified leaflets with severely restricted  leaflet opening and mild asymmetric calcifications extending into the LVOT under the left coronary cusp. Annular measurements suitable for delivery of a 23 mm Edwards-SAPIEN 3 valve.  2. Sufficient coronary to annulus distance.  3. Optimum Fluoroscopic Angle for Delivery: RAO 8 CRA 7.  4. No thrombus in the left atrial appendage.  5. Dilated pulmonary artery measuring 38 mm consistent with pulmonary hypertension.   Electronically Signed   By: Ena Dawley   On: 02/24/2018 15:02   CT ANGIOGRAPHY CHEST, ABDOMEN AND PELVIS  TECHNIQUE: Multidetector CT imaging through the chest, abdomen and pelvis was performed using the standard protocol during bolus administration of intravenous contrast. Multiplanar reconstructed images and MIPs were obtained and  reviewed to evaluate the vascular anatomy.  CONTRAST:  115mL ISOVUE-370 IOPAMIDOL (ISOVUE-370) INJECTION 76%  COMPARISON:  No priors.  FINDINGS: CTA CHEST FINDINGS  Cardiovascular: Heart size is enlarged with left atrial dilatation. There is no significant pericardial fluid, thickening or pericardial calcification. There is aortic atherosclerosis, as well as atherosclerosis of the great vessels of the mediastinum and the coronary arteries, including calcified atherosclerotic plaque in the left main, left anterior descending and right coronary arteries. Severe thickening calcification of the aortic valve. There is also severe thickening calcification of the mitral valve as well as the mitral annulus. Dilatation of the pulmonic trunk (3.8 cm in diameter).  Mediastinum/Lymph Nodes: Multiple prominent borderline enlarged and minimally enlarged mediastinal and bilateral hilar lymph nodes, measuring up to 1.7 cm in short axis in the right paratracheal nodal station. Small hiatal hernia.  Lungs/Pleura: No suspicious appearing pulmonary nodules or masses are noted. No acute consolidative airspace disease. No pleural effusions.  Musculoskeletal/Soft Tissues: Chronic appearing T12 compression fracture with 50% loss of anterior vertebral body height. There are no aggressive appearing lytic or blastic lesions noted in the visualized portions of the skeleton.  CTA ABDOMEN AND PELVIS FINDINGS  Hepatobiliary: No suspicious appearing cystic or solid hepatic lesions. No intra or extrahepatic biliary ductal dilatation. Gallbladder is normal in appearance.  Pancreas: No pancreatic mass. No pancreatic ductal dilatation. No pancreatic or peripancreatic fluid or inflammatory changes.  Spleen: Unremarkable.  Adrenals/Urinary Tract: Bilateral kidneys and bilateral adrenal glands are normal in appearance. No hydroureteronephrosis. Urinary bladder is partially obscured by beam  hardening artifact from the left hip arthroplasty, but is generally unremarkable in appearance.  Stomach/Bowel: Normal appearance of the stomach. No pathologic dilatation of small bowel or colon. The appendix is not confidently identified and may be surgically absent. Regardless, there are no inflammatory changes noted adjacent to the cecum to suggest the presence of an acute appendicitis at this time.  Vascular/Lymphatic: Aortic atherosclerosis, without evidence of aneurysm or dissection in the abdominal or pelvic vasculature. Vascular findings and measurements pertinent to potential TAVR procedure, as detailed below. No lymphadenopathy noted in the abdomen or pelvis.  Reproductive: Uterus and ovaries are atrophic.  Other: No significant volume of ascites.  No pneumoperitoneum.  Musculoskeletal: Status post left hip arthroplasty. Chronic appearing compression fracture of L1 with 20% loss of anterior vertebral body height. There are no aggressive appearing lytic or blastic lesions noted in the visualized portions of the skeleton.  VASCULAR MEASUREMENTS PERTINENT TO TAVR:  AORTA:  Minimal Aortic Diameter-13 x 13 mm  Severity of Aortic Calcification-severe  RIGHT PELVIS:  Right Common Iliac Artery -  Minimal Diameter-9.8 x 7.1 mm  Tortuosity-mild  Calcification-severe  Right External Iliac Artery -  Minimal Diameter-5.2 x 5.0 mm  Tortuosity-moderate  Calcification-moderate to severe  Right Common  Femoral Artery -  Minimal Diameter-8.2 x 7.6 mm  Tortuosity-mild  Calcification-moderate  LEFT PELVIS:  Left Common Iliac Artery -  Minimal Diameter-9.3 x 7.1 mm  Tortuosity-mild  Calcification-severe  Left External Iliac Artery -  Minimal Diameter-7.9 x 2.6 mm  Tortuosity-moderate  Calcification-moderate severe  Left Common Femoral Artery -  Minimal Diameter-7.5 x 8.0  mm  Tortuosity-mild  Calcification-moderate  Review of the MIP images confirms the above findings.  IMPRESSION: 1. Vascular findings and measurements pertinent to potential TAVR procedure, as detailed above. 2. Severe thickening calcification of the aortic valve, compatible with reported clinical history of severe aortic stenosis. 3. There is also severe thickening calcification of the mitral valve and mitral annulus. 4. Cardiomegaly with left atrial dilatation. 5. Dilatation of the pulmonic trunk (3.8 cm in diameter), concerning for pulmonary arterial hypertension. 6. Aortic atherosclerosis, in addition to left main and 2 vessel coronary artery disease. 7. Additional incidental findings, as above.   Electronically Signed   By: Vinnie Langton M.D.   On: 02/24/2018 11:04   Impression:  Patient has stage D severe symptomatic aortic stenosis.  She describes a long history of symptoms of exertional shortness of breath and fatigue with recent hospitalization for acute symptoms at rest consistent with acute on chronic diastolic congestive heart failure, [place] Heart Association functional class IV.  Since hospital discharge she is doing better with symptoms occurring with moderate exertion, functional class III.  I have personally reviewed the patient's recent transthoracic echocardiogram, diagnostic cardiac catheterization, and CT angiograms.  Echocardiogram demonstrates the presence of normal left ventricular systolic function.  There is severe aortic stenosis.  The aortic valve is trileaflet.  There is severe thickening, calcification, and restricted leaflet mobility involving all 3 leaflets of the aortic valve.  Peak velocity across aortic valve measured greater than 4.1 m/s corresponding to mean transvalvular gradient greater than 40 mmHg.  There is at least mild aortic insufficiency.  Patient also has severe calcification with restricted leaflet mobility involving both  leaflets of the mitral valve.  Mean transvalvular gradient across the mitral valve was estimated 10 mmHg by both echocardiogram and diagnostic cardiac catheterization.  She does not have significant mitral regurgitation.  Coronary angiography revealed moderate nonobstructive coronary artery disease.  Right heart pressures were mildly elevated.  I agree the patient would likely benefit from aortic valve replacement.  Risks associated with conventional surgery would be relatively high because of the patient's advanced age, previous stroke with significant residual deficit, limited mobility and functional status, mild cognitive impairment, and significant mitral stenosis which would likely require mitral valve replacement.  I would not consider this patient a candidate for conventional surgery.  Cardiac-gated CTA of the heart reveals anatomical characteristics consistent with aortic stenosis suitable for treatment by transcatheter aortic valve replacement without any significant complicating features and CTA of the aorta and iliac vessels demonstrate what appears to be adequate pelvic vascular access to facilitate a transfemoral approach.  Baseline EKG reveals sinus rhythm with right bundle branch block so the patient may be at somewhat increased risk for the development of complete heart block postoperatively.    Plan:  The patient and her family were counseled at length regarding treatment alternatives for management of severe symptomatic aortic stenosis. Alternative approaches such as conventional aortic valve replacement, transcatheter aortic valve replacement, and continued medical therapy without intervention were compared and contrasted at length.  The risks associated with conventional surgical aortic valve replacement were discussed in detail, as were expectations for  post-operative convalescence, and why I would be reluctant to consider this patient a candidate for conventional surgery.  Issues specific  to transcatheter aortic valve replacement were discussed including questions about long term valve durability, the potential for paravalvular leak, possible increased risk of need for permanent pacemaker placement, and other technical complications related to the procedure itself.  Long-term prognosis with medical therapy was discussed. This discussion was placed in the context of the patient's own specific clinical presentation and past medical history.  All of their questions have been addressed.  The patient desires to proceed with transcatheter aortic valve replacement.  Following the decision to proceed with transcatheter aortic valve replacement, a discussion has been held regarding what types of management strategies would be attempted intraoperatively in the event of life-threatening complications, including whether or not the patient would be considered a candidate for the use of cardiopulmonary bypass and/or conversion to open sternotomy for attempted surgical intervention.  The patient has been advised of a variety of complications that might develop including but not limited to risks of death, stroke, paravalvular leak, aortic dissection or other major vascular complications, aortic annulus rupture, device embolization, cardiac rupture or perforation, mitral regurgitation, acute myocardial infarction, arrhythmia, heart block or bradycardia requiring permanent pacemaker placement, congestive heart failure, respiratory failure, renal failure, pneumonia, infection, other late complications related to structural valve deterioration or migration, or other complications that might ultimately cause a temporary or permanent loss of functional independence or other long term morbidity.  The patient provides full informed consent for the procedure as described and all questions were answered.   I spent in excess of 90 minutes during the conduct of this office consultation and >50% of this time involved direct  face-to-face encounter with the patient for counseling and/or coordination of their care.     Valentina Gu. Roxy Manns, MD 02/27/2018 1:11 PM

## 2018-02-28 ENCOUNTER — Inpatient Hospital Stay (HOSPITAL_COMMUNITY)
Admission: RE | Admit: 2018-02-28 | Discharge: 2018-03-06 | DRG: 266 | Disposition: A | Discharge status: Skilled Nursing Facility | Payer: Medicare Other | Attending: Cardiovascular Disease | Admitting: Cardiovascular Disease

## 2018-02-28 ENCOUNTER — Encounter (HOSPITAL_COMMUNITY)
Admission: RE | Disposition: A | Discharge status: Skilled Nursing Facility | Payer: Self-pay | Source: Home / Self Care | Attending: Cardiovascular Disease

## 2018-02-28 ENCOUNTER — Encounter (HOSPITAL_COMMUNITY): Payer: Self-pay

## 2018-02-28 ENCOUNTER — Inpatient Hospital Stay (HOSPITAL_COMMUNITY): Payer: Medicare Other | Admitting: Anesthesiology

## 2018-02-28 ENCOUNTER — Inpatient Hospital Stay (HOSPITAL_COMMUNITY): Payer: Medicare Other

## 2018-02-28 ENCOUNTER — Inpatient Hospital Stay (HOSPITAL_COMMUNITY): Payer: Medicare Other | Admitting: Vascular Surgery

## 2018-02-28 ENCOUNTER — Ambulatory Visit (HOSPITAL_COMMUNITY): Payer: Medicare Other

## 2018-02-28 DIAGNOSIS — Z85828 Personal history of other malignant neoplasm of skin: Secondary | ICD-10-CM

## 2018-02-28 DIAGNOSIS — Z7901 Long term (current) use of anticoagulants: Secondary | ICD-10-CM

## 2018-02-28 DIAGNOSIS — Z8249 Family history of ischemic heart disease and other diseases of the circulatory system: Secondary | ICD-10-CM

## 2018-02-28 DIAGNOSIS — E039 Hypothyroidism, unspecified: Secondary | ICD-10-CM | POA: Diagnosis not present

## 2018-02-28 DIAGNOSIS — I442 Atrioventricular block, complete: Secondary | ICD-10-CM | POA: Diagnosis present

## 2018-02-28 DIAGNOSIS — Z841 Family history of disorders of kidney and ureter: Secondary | ICD-10-CM

## 2018-02-28 DIAGNOSIS — I69351 Hemiplegia and hemiparesis following cerebral infarction affecting right dominant side: Secondary | ICD-10-CM

## 2018-02-28 DIAGNOSIS — R001 Bradycardia, unspecified: Secondary | ICD-10-CM

## 2018-02-28 DIAGNOSIS — Z87891 Personal history of nicotine dependence: Secondary | ICD-10-CM

## 2018-02-28 DIAGNOSIS — R296 Repeated falls: Secondary | ICD-10-CM | POA: Diagnosis present

## 2018-02-28 DIAGNOSIS — D649 Anemia, unspecified: Secondary | ICD-10-CM | POA: Diagnosis present

## 2018-02-28 DIAGNOSIS — I350 Nonrheumatic aortic (valve) stenosis: Secondary | ICD-10-CM

## 2018-02-28 DIAGNOSIS — M1990 Unspecified osteoarthritis, unspecified site: Secondary | ICD-10-CM | POA: Diagnosis present

## 2018-02-28 DIAGNOSIS — I10 Essential (primary) hypertension: Secondary | ICD-10-CM | POA: Diagnosis present

## 2018-02-28 DIAGNOSIS — E875 Hyperkalemia: Secondary | ICD-10-CM | POA: Diagnosis present

## 2018-02-28 DIAGNOSIS — I272 Pulmonary hypertension, unspecified: Secondary | ICD-10-CM | POA: Diagnosis not present

## 2018-02-28 DIAGNOSIS — I509 Heart failure, unspecified: Secondary | ICD-10-CM | POA: Diagnosis not present

## 2018-02-28 DIAGNOSIS — I4510 Unspecified right bundle-branch block: Secondary | ICD-10-CM | POA: Diagnosis not present

## 2018-02-28 DIAGNOSIS — I2510 Atherosclerotic heart disease of native coronary artery without angina pectoris: Secondary | ICD-10-CM | POA: Diagnosis not present

## 2018-02-28 DIAGNOSIS — Z7982 Long term (current) use of aspirin: Secondary | ICD-10-CM

## 2018-02-28 DIAGNOSIS — Z79899 Other long term (current) drug therapy: Secondary | ICD-10-CM

## 2018-02-28 DIAGNOSIS — Z811 Family history of alcohol abuse and dependence: Secondary | ICD-10-CM

## 2018-02-28 DIAGNOSIS — R413 Other amnesia: Secondary | ICD-10-CM | POA: Diagnosis present

## 2018-02-28 DIAGNOSIS — I739 Peripheral vascular disease, unspecified: Secondary | ICD-10-CM | POA: Diagnosis present

## 2018-02-28 DIAGNOSIS — Z952 Presence of prosthetic heart valve: Secondary | ICD-10-CM

## 2018-02-28 DIAGNOSIS — Z91048 Other nonmedicinal substance allergy status: Secondary | ICD-10-CM

## 2018-02-28 DIAGNOSIS — Z79890 Hormone replacement therapy (postmenopausal): Secondary | ICD-10-CM

## 2018-02-28 DIAGNOSIS — I5033 Acute on chronic diastolic (congestive) heart failure: Secondary | ICD-10-CM | POA: Diagnosis not present

## 2018-02-28 DIAGNOSIS — I083 Combined rheumatic disorders of mitral, aortic and tricuspid valves: Secondary | ICD-10-CM | POA: Diagnosis not present

## 2018-02-28 DIAGNOSIS — Z8673 Personal history of transient ischemic attack (TIA), and cerebral infarction without residual deficits: Secondary | ICD-10-CM

## 2018-02-28 DIAGNOSIS — Z006 Encounter for examination for normal comparison and control in clinical research program: Secondary | ICD-10-CM | POA: Diagnosis not present

## 2018-02-28 DIAGNOSIS — I469 Cardiac arrest, cause unspecified: Secondary | ICD-10-CM | POA: Diagnosis not present

## 2018-02-28 DIAGNOSIS — I110 Hypertensive heart disease with heart failure: Secondary | ICD-10-CM | POA: Diagnosis not present

## 2018-02-28 DIAGNOSIS — Z950 Presence of cardiac pacemaker: Secondary | ICD-10-CM

## 2018-02-28 DIAGNOSIS — R079 Chest pain, unspecified: Secondary | ICD-10-CM

## 2018-02-28 DIAGNOSIS — E785 Hyperlipidemia, unspecified: Secondary | ICD-10-CM | POA: Diagnosis not present

## 2018-02-28 DIAGNOSIS — R0789 Other chest pain: Secondary | ICD-10-CM

## 2018-02-28 DIAGNOSIS — R2681 Unsteadiness on feet: Secondary | ICD-10-CM | POA: Diagnosis present

## 2018-02-28 HISTORY — DX: Presence of prosthetic heart valve: Z95.2

## 2018-02-28 HISTORY — PX: TRANSCATHETER AORTIC VALVE REPLACEMENT, TRANSFEMORAL: SHX6400

## 2018-02-28 HISTORY — DX: Personal history of transient ischemic attack (TIA), and cerebral infarction without residual deficits: Z86.73

## 2018-02-28 HISTORY — PX: TEE WITHOUT CARDIOVERSION: SHX5443

## 2018-02-28 HISTORY — DX: Chronic diastolic (congestive) heart failure: I50.32

## 2018-02-28 HISTORY — DX: Nonrheumatic aortic (valve) stenosis: I35.0

## 2018-02-28 LAB — POCT I-STAT 4, (NA,K, GLUC, HGB,HCT)
Glucose, Bld: 97 mg/dL (ref 70–99)
Glucose, Bld: 117 mg/dL — ABNORMAL HIGH (ref 70–99)
HCT: 25 % — ABNORMAL LOW (ref 36.0–46.0)
HCT: 21 % — ABNORMAL LOW (ref 36.0–46.0)
Hemoglobin: 8.5 g/dL — ABNORMAL LOW (ref 12.0–15.0)
Hemoglobin: 7.1 g/dL — ABNORMAL LOW (ref 12.0–15.0)
Potassium: 4.1 mmol/L (ref 3.5–5.1)
Potassium: 4.1 mmol/L (ref 3.5–5.1)
Sodium: 140 mmol/L (ref 135–145)
Sodium: 140 mmol/L (ref 135–145)

## 2018-02-28 LAB — POCT I-STAT 7, (LYTES, BLD GAS, ICA,H+H)
Acid-base deficit: 4 mmol/L — ABNORMAL HIGH (ref 0.0–2.0)
Bicarbonate: 20.9 mmol/L (ref 20.0–28.0)
Calcium, Ion: 1.22 mmol/L (ref 1.15–1.40)
HCT: 23 % — ABNORMAL LOW (ref 36.0–46.0)
Hemoglobin: 7.8 g/dL — ABNORMAL LOW (ref 12.0–15.0)
O2 Saturation: 95 %
Patient temperature: 97.7
Potassium: 4.3 mmol/L (ref 3.5–5.1)
Sodium: 140 mmol/L (ref 135–145)
TCO2: 22 mmol/L (ref 22–32)
pCO2 arterial: 36 mmHg (ref 32.0–48.0)
pH, Arterial: 7.368 (ref 7.350–7.450)
pO2, Arterial: 75 mmHg — ABNORMAL LOW (ref 83.0–108.0)

## 2018-02-28 LAB — POCT ACTIVATED CLOTTING TIME
Activated Clotting Time: 131 seconds
Activated Clotting Time: 103 seconds
Activated Clotting Time: 213 seconds

## 2018-02-28 LAB — POCT I-STAT CREATININE: Creatinine, Ser: 0.7 mg/dL (ref 0.44–1.00)

## 2018-02-28 LAB — HEMOGLOBIN A1C
Hgb A1c MFr Bld: 5.5 % (ref 4.8–5.6)
Mean Plasma Glucose: 111 mg/dL

## 2018-02-28 LAB — MRSA PCR SCREENING: MRSA by PCR: NEGATIVE

## 2018-02-28 SURGERY — IMPLANTATION, AORTIC VALVE, TRANSCATHETER, FEMORAL APPROACH
Anesthesia: Monitor Anesthesia Care

## 2018-02-28 MED ORDER — ONDANSETRON HCL 4 MG/2ML IJ SOLN
4.0000 mg | Freq: Four times a day (QID) | INTRAMUSCULAR | Status: DC | PRN
  Administered 2018-03-01: 4 mg via INTRAVENOUS
  Filled 2018-02-28 (×2): qty 2

## 2018-02-28 MED ORDER — CLOPIDOGREL BISULFATE 75 MG PO TABS
75.0000 mg | ORAL_TABLET | Freq: Every day | ORAL | Status: DC
  Administered 2018-03-01 – 2018-03-06 (×6): 75 mg via ORAL
  Filled 2018-02-28 (×6): qty 1

## 2018-02-28 MED ORDER — SODIUM CHLORIDE 0.9 % IV SOLN: 250.0000 mL | INTRAVENOUS | Status: DC | PRN

## 2018-02-28 MED ORDER — TRAMADOL HCL 50 MG PO TABS
50.0000 mg | ORAL_TABLET | ORAL | Status: DC | PRN
  Administered 2018-03-03 – 2018-03-05 (×5): 100 mg via ORAL
  Administered 2018-03-06: 50 mg via ORAL
  Administered 2018-03-06: 100 mg via ORAL
  Filled 2018-02-28 (×2): qty 2
  Filled 2018-02-28: qty 1
  Filled 2018-02-28 (×4): qty 2

## 2018-02-28 MED ORDER — NOREPINEPHRINE BITARTRATE 1 MG/ML IV SOLN
INTRAVENOUS | Status: DC | PRN
  Administered 2018-02-28: 2 ug/min via INTRAVENOUS

## 2018-02-28 MED ORDER — SODIUM CHLORIDE 0.9% FLUSH: 3.0000 mL | INTRAVENOUS | Status: DC | PRN

## 2018-02-28 MED ORDER — DEXAMETHASONE SODIUM PHOSPHATE 10 MG/ML IJ SOLN
INTRAMUSCULAR | Status: DC | PRN
  Administered 2018-02-28: 4 mg via INTRAVENOUS

## 2018-02-28 MED ORDER — PROTAMINE SULFATE 10 MG/ML IV SOLN
INTRAVENOUS | Status: DC | PRN
  Administered 2018-02-28: 30 mg via INTRAVENOUS
  Administered 2018-02-28: 50 mg via INTRAVENOUS
  Administered 2018-02-28: 20 mg via INTRAVENOUS
  Administered 2018-02-28: 50 mg via INTRAVENOUS

## 2018-02-28 MED ORDER — HYDRALAZINE HCL 20 MG/ML IJ SOLN
10.0000 mg | Freq: Once | INTRAMUSCULAR | Status: AC
  Administered 2018-02-28: 10 mg via INTRAVENOUS

## 2018-02-28 MED ORDER — IOPAMIDOL (ISOVUE-370) INJECTION 76%
INTRAVENOUS | Status: AC
  Filled 2018-02-28: qty 125

## 2018-02-28 MED ORDER — CHLORHEXIDINE GLUCONATE 4 % EX LIQD
60.0000 mL | Freq: Once | CUTANEOUS | Status: DC
  Filled 2018-02-28: qty 60

## 2018-02-28 MED ORDER — CHLORHEXIDINE GLUCONATE 4 % EX LIQD
30.0000 mL | CUTANEOUS | Status: DC
  Filled 2018-02-28: qty 30

## 2018-02-28 MED ORDER — PROPOFOL 500 MG/50ML IV EMUL
INTRAVENOUS | Status: DC | PRN
  Administered 2018-02-28: 40 ug/kg/min via INTRAVENOUS

## 2018-02-28 MED ORDER — METOPROLOL TARTRATE 5 MG/5ML IV SOLN: 2.5000 mg | INTRAVENOUS | Status: DC | PRN

## 2018-02-28 MED ORDER — ASPIRIN 81 MG PO CHEW
81.0000 mg | CHEWABLE_TABLET | Freq: Every day | ORAL | Status: DC
  Administered 2018-03-01 – 2018-03-06 (×6): 81 mg via ORAL
  Filled 2018-02-28 (×6): qty 1

## 2018-02-28 MED ORDER — SODIUM CHLORIDE 0.9 % IV SOLN
INTRAVENOUS | Status: DC
  Administered 2018-02-28: 13:00:00 via INTRAVENOUS

## 2018-02-28 MED ORDER — SODIUM CHLORIDE 0.9 % IV SOLN
INTRAVENOUS | Status: AC
  Administered 2018-02-28 (×2): via INTRAVENOUS

## 2018-02-28 MED ORDER — FENTANYL CITRATE (PF) 100 MCG/2ML IJ SOLN
INTRAMUSCULAR | Status: AC
  Administered 2018-02-28: 50 ug via INTRAVENOUS
  Filled 2018-02-28: qty 2

## 2018-02-28 MED ORDER — MORPHINE SULFATE (PF) 2 MG/ML IV SOLN: 1.0000 mg | INTRAVENOUS | Status: DC | PRN

## 2018-02-28 MED ORDER — LEVOTHYROXINE SODIUM 100 MCG PO TABS
100.0000 ug | ORAL_TABLET | Freq: Every day | ORAL | Status: DC
  Administered 2018-03-01 – 2018-03-06 (×6): 100 ug via ORAL
  Filled 2018-02-28 (×6): qty 1

## 2018-02-28 MED ORDER — HEPARIN (PORCINE) IN NACL 1000-0.9 UT/500ML-% IV SOLN
INTRAVENOUS | Status: DC | PRN
  Administered 2018-02-28 (×2): 500 mL

## 2018-02-28 MED ORDER — ONDANSETRON HCL 4 MG/2ML IJ SOLN
INTRAMUSCULAR | Status: DC | PRN
  Administered 2018-02-28: 4 mg via INTRAVENOUS

## 2018-02-28 MED ORDER — ACETAMINOPHEN 650 MG RE SUPP: 650.0000 mg | Freq: Four times a day (QID) | RECTAL | Status: DC | PRN

## 2018-02-28 MED ORDER — HEPARIN SODIUM (PORCINE) 1000 UNIT/ML IJ SOLN
INTRAMUSCULAR | Status: DC | PRN
  Administered 2018-02-28: 12000 [IU] via INTRAVENOUS
  Administered 2018-02-28: 5000 [IU] via INTRAVENOUS

## 2018-02-28 MED ORDER — FENTANYL CITRATE (PF) 100 MCG/2ML IJ SOLN
50.0000 ug | Freq: Once | INTRAMUSCULAR | Status: AC
  Administered 2018-02-28: 50 ug via INTRAVENOUS

## 2018-02-28 MED ORDER — HEPARIN (PORCINE) IN NACL 1000-0.9 UT/500ML-% IV SOLN
INTRAVENOUS | Status: AC
  Filled 2018-02-28: qty 500

## 2018-02-28 MED ORDER — IOPAMIDOL (ISOVUE-370) INJECTION 76%
INTRAVENOUS | Status: DC | PRN
  Administered 2018-02-28: 45 mL via INTRA_ARTERIAL

## 2018-02-28 MED ORDER — PHENYLEPHRINE HCL-NACL 20-0.9 MG/250ML-% IV SOLN
0.0000 ug/min | INTRAVENOUS | Status: DC
  Administered 2018-03-01: 20 ug/min via INTRAVENOUS
  Filled 2018-02-28: qty 250

## 2018-02-28 MED ORDER — LIDOCAINE HCL (PF) 1 % IJ SOLN
INTRAMUSCULAR | Status: DC | PRN
  Administered 2018-02-28: 15 mL

## 2018-02-28 MED ORDER — OXYCODONE HCL 5 MG PO TABS
5.0000 mg | ORAL_TABLET | ORAL | Status: DC | PRN
  Administered 2018-03-01 – 2018-03-02 (×2): 5 mg via ORAL
  Administered 2018-03-02: 10 mg via ORAL
  Administered 2018-03-03: 5 mg via ORAL
  Filled 2018-02-28: qty 1
  Filled 2018-02-28: qty 2
  Filled 2018-02-28 (×2): qty 1

## 2018-02-28 MED ORDER — FENTANYL CITRATE (PF) 250 MCG/5ML IJ SOLN
INTRAMUSCULAR | Status: DC | PRN
  Administered 2018-02-28: 25 ug via INTRAVENOUS

## 2018-02-28 MED ORDER — LACTATED RINGERS IV SOLN
INTRAVENOUS | Status: DC | PRN
  Administered 2018-02-28: 13:00:00 via INTRAVENOUS

## 2018-02-28 MED ORDER — ACETAMINOPHEN 500 MG PO TABS
1000.0000 mg | ORAL_TABLET | Freq: Once | ORAL | Status: AC
  Administered 2018-02-28: 1000 mg via ORAL
  Filled 2018-02-28: qty 2

## 2018-02-28 MED ORDER — SODIUM CHLORIDE 0.9 % IV SOLN
1.5000 g | Freq: Two times a day (BID) | INTRAVENOUS | Status: DC
  Administered 2018-03-01 – 2018-03-02 (×3): 1.5 g via INTRAVENOUS
  Filled 2018-02-28 (×4): qty 1.5

## 2018-02-28 MED ORDER — PHENYLEPHRINE 40 MCG/ML (10ML) SYRINGE FOR IV PUSH (FOR BLOOD PRESSURE SUPPORT)
PREFILLED_SYRINGE | INTRAVENOUS | Status: DC | PRN
  Administered 2018-02-28: 40 ug via INTRAVENOUS

## 2018-02-28 MED ORDER — ATORVASTATIN CALCIUM 40 MG PO TABS
40.0000 mg | ORAL_TABLET | Freq: Every day | ORAL | Status: DC
  Administered 2018-02-28 – 2018-03-05 (×6): 40 mg via ORAL
  Filled 2018-02-28 (×6): qty 1

## 2018-02-28 MED ORDER — SODIUM CHLORIDE 0.9% FLUSH
3.0000 mL | Freq: Two times a day (BID) | INTRAVENOUS | Status: DC
  Administered 2018-02-28 – 2018-03-01 (×3): 3 mL via INTRAVENOUS

## 2018-02-28 MED ORDER — CHLORHEXIDINE GLUCONATE 0.12 % MT SOLN
OROMUCOSAL | Status: AC
  Administered 2018-02-28: 15 mL via OROMUCOSAL
  Filled 2018-02-28: qty 15

## 2018-02-28 MED ORDER — LIDOCAINE HCL (PF) 1 % IJ SOLN
INTRAMUSCULAR | Status: AC
  Filled 2018-02-28: qty 30

## 2018-02-28 MED ORDER — ACETAMINOPHEN 325 MG PO TABS
650.0000 mg | ORAL_TABLET | Freq: Four times a day (QID) | ORAL | Status: DC | PRN
  Administered 2018-03-01: 650 mg via ORAL
  Filled 2018-02-28: qty 2

## 2018-02-28 MED ORDER — HYDRALAZINE HCL 20 MG/ML IJ SOLN
INTRAMUSCULAR | Status: AC
  Filled 2018-02-28: qty 1

## 2018-02-28 MED ORDER — LIDOCAINE 2% (20 MG/ML) 5 ML SYRINGE
INTRAMUSCULAR | Status: DC | PRN
  Administered 2018-02-28: 50 mg via INTRAVENOUS

## 2018-02-28 MED ORDER — CHLORHEXIDINE GLUCONATE 0.12 % MT SOLN
15.0000 mL | Freq: Once | OROMUCOSAL | Status: AC
  Administered 2018-02-28 – 2018-03-01 (×2): 15 mL via OROMUCOSAL
  Filled 2018-02-28: qty 15

## 2018-02-28 MED ORDER — ACETAMINOPHEN 500 MG PO TABS
ORAL_TABLET | ORAL | Status: AC
  Administered 2018-02-28: 1000 mg via ORAL
  Filled 2018-02-28: qty 2

## 2018-02-28 MED ORDER — VANCOMYCIN HCL IN DEXTROSE 1-5 GM/200ML-% IV SOLN
1000.0000 mg | Freq: Once | INTRAVENOUS | Status: AC
  Administered 2018-03-01: 1000 mg via INTRAVENOUS

## 2018-02-28 MED ORDER — NITROGLYCERIN IN D5W 200-5 MCG/ML-% IV SOLN: 0.0000 ug/min | INTRAVENOUS | Status: DC

## 2018-02-28 MED ORDER — ACETAMINOPHEN 500 MG PO TABS
1000.0000 mg | ORAL_TABLET | Freq: Once | ORAL | Status: DC
  Filled 2018-02-28: qty 2

## 2018-02-28 MED ORDER — MIDAZOLAM HCL 2 MG/2ML IJ SOLN
INTRAMUSCULAR | Status: AC
  Filled 2018-02-28: qty 2

## 2018-02-28 SURGICAL SUPPLY — 29 items
BAG SNAP BAND KOVER 36X36 (MISCELLANEOUS) ×4 IMPLANT
CABLE ADAPT PACING TEMP 12FT (ADAPTER) ×2 IMPLANT
CATH 23 EDWARDS DELIVERY SYS (CATHETERS) ×2 IMPLANT
CATH DIAG 6FR PIGTAIL ANGLED (CATHETERS) ×4 IMPLANT
CATH INFINITI 6F AL1 (CATHETERS) ×2 IMPLANT
CATH S G BIP PACING (CATHETERS) ×2 IMPLANT
CLOSURE MYNX CONTROL 6F/7F (Vascular Products) ×2 IMPLANT
CRIMPER (MISCELLANEOUS) ×2 IMPLANT
DEVICE CLOSURE PERCLS PRGLD 6F (VASCULAR PRODUCTS) ×4 IMPLANT
ELECT DEFIB PAD ADLT CADENCE (PAD) ×2 IMPLANT
GUIDEWIRE SAFE TJ AMPLATZ EXST (WIRE) ×2 IMPLANT
KIT HEART LEFT (KITS) ×2 IMPLANT
KIT MICROPUNCTURE NIT STIFF (SHEATH) ×2 IMPLANT
PACK CARDIAC CATHETERIZATION (CUSTOM PROCEDURE TRAY) ×2 IMPLANT
PERCLOSE PROGLIDE 6F (VASCULAR PRODUCTS) ×8
SHEATH 14X36 EDWARDS (SHEATH) ×2 IMPLANT
SHEATH BRITE TIP 6FR 35CM (SHEATH) ×2 IMPLANT
SHEATH PINNACLE 6F 10CM (SHEATH) ×2 IMPLANT
SHEATH PINNACLE 8F 10CM (SHEATH) ×2 IMPLANT
SLEEVE REPOSITIONING LENGTH 30 (MISCELLANEOUS) ×2 IMPLANT
STOPCOCK MORSE 400PSI 3WAY (MISCELLANEOUS) ×4 IMPLANT
SYR MEDRAD MARK V 150ML (SYRINGE) ×2 IMPLANT
TRANSDUCER W/STOPCOCK (MISCELLANEOUS) ×4 IMPLANT
TUBE CONN 8.8X1320 FR HP M-F (CONNECTOR) ×2 IMPLANT
VALVE HEART TRANSCATH SZ3 23MM (Valve) ×2 IMPLANT
WIRE AMPLATZ SS-J .035X180CM (WIRE) ×2 IMPLANT
WIRE EMERALD 3MM-J .035X150CM (WIRE) ×2 IMPLANT
WIRE EMERALD 3MM-J .035X260CM (WIRE) ×2 IMPLANT
WIRE EMERALD ST .035X260CM (WIRE) ×2 IMPLANT

## 2018-02-28 NOTE — Anesthesia Procedure Notes (Signed)
Arterial Line Insertion Start/End2/18/2020 11:30 AM, 02/28/2018 11:40 AM Performed by: Lance Coon, CRNA, CRNA  Patient location: Pre-op. Preanesthetic checklist: patient identified, IV checked, site marked, risks and benefits discussed, surgical consent, monitors and equipment checked, pre-op evaluation, timeout performed and anesthesia consent Lidocaine 1% used for infiltration Right, radial was placed Catheter size: 20 G Hand hygiene performed , maximum sterile barriers used  and Seldinger technique used  Attempts: 1 Procedure performed without using ultrasound guided technique. Following insertion, dressing applied and Biopatch. Post procedure assessment: normal and unchanged  Patient tolerated the procedure well with no immediate complications.

## 2018-02-28 NOTE — Op Note (Signed)
[facility]   MULTIDISCIPLINARY HEART VALVE TEAM   TAVR OPERATIVE NOTE   Date of Procedure:  02/28/2018  Preoperative Diagnosis: Severe Aortic Stenosis   Postoperative Diagnosis: Same   Procedure:    Transcatheter Aortic Valve Replacement - Percutaneous  Transfemoral Approach  Edwards Sapien 3 THV (size 23 mm, model # 9600TFX, serial #[id number])   Co-Surgeons:  Valentina Gu. Roxy Manns, MD and Sherren Mocha, MD  Anesthesiologist:  Arabella Merles, MD  Echocardiographer:  Jenkins Rouge, MD  Pre-operative Echo Findings:  Severe aortic stenosis and mild AI  Moderate MS  Normal left ventricular systolic function  Post-operative Echo Findings:  No paravalvular leak  Unchanged, normal left ventricular systolic function  BRIEF CLINICAL NOTE AND INDICATIONS FOR SURGERY  Please see the complete note of Dr Roxy Manns for full description of patient history and surgical indication.   Following the decision to proceed with transcatheter aortic valve replacement, a discussion has been held regarding what types of management strategies would be attempted intraoperatively in the event of life-threatening complications, including whether or not the patient would be considered a candidate for the use of cardiopulmonary bypass and/or conversion to open sternotomy for attempted surgical intervention.  The patient has been advised of a variety of complications that might develop peculiar to this approach including but not limited to risks of death, stroke, paravalvular leak, aortic dissection or other major vascular complications, aortic annulus rupture, device embolization, cardiac rupture or perforation, acute myocardial infarction, arrhythmia, heart block or bradycardia requiring permanent pacemaker placement, congestive heart failure, respiratory failure, renal failure, pneumonia, infection, other late complications related to structural valve deterioration or migration, or other  complications that might ultimately cause a temporary or permanent loss of functional independence or other long term morbidity.  The patient provides full informed consent for the procedure as described and all questions were answered preoperatively.  DETAILS OF THE OPERATIVE PROCEDURE  PREPARATION:   The patient is brought to the operating room on the above mentioned date and central monitoring was established by the anesthesia team including placement of a central venous catheter and radial arterial line. The patient is placed in the supine position on the operating table.  Intravenous antibiotics are administered. The patient is monitored closely throughout the procedure under conscious sedation.  Baseline transthoracic echocardiogram is performed. The patient's chest, abdomen, both groins, and both lower extremities are prepared and draped in a sterile manner. A time out procedure is performed.   PERIPHERAL ACCESS:   Using ultrasound guidance, femoral arterial and venous access is obtained with placement of 6 Fr sheaths on the left side.  A pigtail diagnostic catheter was passed through the femoral arterial sheath under fluoroscopic guidance into the aortic root.  A temporary transvenous pacemaker catheter was passed through the femoral venous sheath under fluoroscopic guidance into the right ventricle.  The pacemaker was tested to ensure stable lead placement and pacemaker capture. Aortic root angiography was performed in order to determine the optimal angiographic angle for valve deployment.  TRANSFEMORAL ACCESS:  A micropuncture technique is used to access the right femoral artery under fluoroscopic and ultrasound guidance.  2 Perclose devices are deployed at 10' and 2' positions to 'PreClose' the femoral artery. An 8 French sheath is placed and then an Amplatz Superstiff wire is advanced through the sheath. This is changed out for a 14 French transfemoral E-Sheath after progressively dilating  over the Superstiff wire.  An AL-1 catheter was used to direct a straight-tip exchange length wire  across the native aortic valve into the left ventricle. This was exchanged out for a pigtail catheter and position was confirmed in the LV apex. Simultaneous LV and Ao pressures were recorded.  The pigtail catheter was exchanged for an Amplatz Extra-stiff wire in the LV apex.    BALLOON AORTIC VALVULOPLASTY:  Not performed  TRANSCATHETER HEART VALVE DEPLOYMENT:  An Edwards Sapien 3 transcatheter heart valve (size 23 mm, model #9600TFX, serial #[id number]) was prepared and crimped per manufacturer's guidelines, and the proper orientation of the valve is confirmed on the [employer] delivery system. The valve was advanced through the introducer sheath using normal technique until in an appropriate position in the abdominal aorta beyond the sheath tip. The balloon was then retracted and using the fine-tuning wheel was centered on the valve. The valve was then advanced across the aortic arch using appropriate flexion of the catheter. The valve was carefully positioned across the aortic valve annulus. The Commander catheter was retracted using normal technique. Once final position of the valve has been confirmed by angiographic assessment, the valve is deployed while temporarily holding ventilation and during rapid ventricular pacing to maintain systolic blood pressure < 50 mmHg and pulse pressure < 10 mmHg. The balloon inflation is held for >3 seconds after reaching full deployment volume. Once the balloon has fully deflated the balloon is retracted into the ascending aorta and valve function is assessed using echocardiography. There is felt to be no paravalvular leak and no central aortic insufficiency.  The patient's hemodynamic recovery following valve deployment is good.  The deployment balloon and guidewire are both removed. Echo demostrated acceptable post-procedural gradients, stable mitral valve function,  and no aortic insufficiency.   PROCEDURE COMPLETION:  The sheath was removed and femoral artery closure is performed using the 2 previously deployed Perclose devices.  Protamine is administered once femoral arterial repair was complete. The site is clear with no evidence of bleeding or hematoma after the sutures are tightened. The temporary pacemaker, pigtail catheters and femoral sheaths were removed with manual pressure used for hemostasis.   The patient tolerated the procedure well and is transported to the surgical intensive care in stable condition. There were no immediate intraoperative complications. All sponge instrument and needle counts are verified correct at completion of the operation.   The patient received a total of 45 mL of intravenous contrast during the procedure.  Sherren Mocha, MD 02/28/2018 3:30 PM

## 2018-02-28 NOTE — Anesthesia Procedure Notes (Signed)
Central Venous Catheter Insertion Performed by: Roderic Palau, MD, anesthesiologist Start/End2/18/2020 11:34 AM, 02/28/2018 11:44 AM Patient location: Pre-op. Preanesthetic checklist: patient identified, IV checked, site marked, risks and benefits discussed, surgical consent, monitors and equipment checked, pre-op evaluation, timeout performed and anesthesia consent Position: Trendelenburg Lidocaine 1% used for infiltration and patient sedated Hand hygiene performed , maximum sterile barriers used  and Seldinger technique used Catheter size: 8 Fr Total catheter length 16. Central line was placed.Double lumen Procedure performed using ultrasound guided technique. Ultrasound Notes:anatomy identified, needle tip was noted to be adjacent to the nerve/plexus identified, no ultrasound evidence of intravascular and/or intraneural injection and image(s) printed for medical record Attempts: 1 Following insertion, dressing applied, line sutured and Biopatch. Post procedure assessment: blood return through all ports  Patient tolerated the procedure well with no immediate complications.

## 2018-02-28 NOTE — Transfer of Care (Signed)
Immediate Anesthesia Transfer of Care Note  Patient: Ercie Eliasen  Procedure(s) Performed: TRANSCATHETER AORTIC VALVE REPLACEMENT, TRANSFEMORAL (N/A ) TRANSESOPHAGEAL ECHOCARDIOGRAM (TEE) (N/A )  Patient Location: Cath Lab  Anesthesia Type:MAC  Level of Consciousness: awake, alert  and oriented  Airway & Oxygen Therapy: Patient Spontanous Breathing and Patient connected to nasal cannula oxygen  Post-op Assessment: Report given to RN and Post -op Vital signs reviewed and stable  Post vital signs: Reviewed and stable  Last Vitals:  Vitals Value Taken Time  BP 132/44 02/28/2018  3:17 PM  Temp    Pulse 44 02/28/2018  3:20 PM  Resp 16 02/28/2018  3:20 PM  SpO2 96 % 02/28/2018  3:20 PM  Vitals shown include unvalidated device data.  Last Pain:  Vitals:   02/28/18 1111  TempSrc:   PainSc: 0-No pain         Complications: No apparent anesthesia complications

## 2018-02-28 NOTE — Anesthesia Procedure Notes (Signed)
Procedure Name: MAC Date/Time: 02/28/2018 1:26 PM Performed by: Teressa Lower., CRNA Pre-anesthesia Checklist: Patient identified, Emergency Drugs available, Suction available and Patient being monitored Patient Re-evaluated:Patient Re-evaluated prior to induction Oxygen Delivery Method: Simple face mask Induction Type: IV induction Ventilation: Oral airway inserted - appropriate to patient size

## 2018-02-28 NOTE — Interval H&P Note (Signed)
History and Physical Interval Note:  02/28/2018 11:50 AM  Julie Mcgee  has presented today for surgery, with the diagnosis of Severe Aortic Stenosis  The various methods of treatment have been discussed with the patient and family. After consideration of risks, benefits and other options for treatment, the patient has consented to  Procedure(s): TRANSCATHETER AORTIC VALVE REPLACEMENT, TRANSFEMORAL (N/A) TRANSESOPHAGEAL ECHOCARDIOGRAM (TEE) (N/A) as a surgical intervention .  The patient's history has been reviewed, patient examined, no change in status, stable for surgery.  I have reviewed the patient's chart and labs.  Questions were answered to the patient's satisfaction.     [name]

## 2018-02-28 NOTE — Progress Notes (Signed)
2D Echocardiogram has been performed.  Julie Mcgee 02/28/2018, 2:29 PM

## 2018-02-28 NOTE — Op Note (Signed)
[facility]   MULTIDISCIPLINARY HEART VALVE TEAM   TAVR OPERATIVE NOTE   Date of Procedure:  02/28/2018  Preoperative Diagnosis: Severe Aortic Stenosis   Postoperative Diagnosis: Same   Procedure:    Transcatheter Aortic Valve Replacement - Percutaneous Right Transfemoral Approach  Edwards Sapien 3 THV (size 23 mm, model # 9600TFX, serial # [id number])   Co-Surgeons:  Valentina Gu. Roxy Manns, MD and Sherren Mocha, MD  Anesthesiologist:  Arabella Merles, MD  Echocardiographer:  Jenkins Rouge, MD  Pre-operative Echo Findings:  Severe aortic stenosis  Mild aortic insufficiency  Moderate mitral stenosis  Normal left ventricular systolic function  Post-operative Echo Findings:  No paravalvular leak  Normal left ventricular systolic function   BRIEF CLINICAL NOTE AND INDICATIONS FOR SURGERY  Patient is an 85 year old female with history of aortic stenosis, mitral stenosis, hypertension, previous stroke with residual right hemiparesis, right bundle branch block, and hyperlipidemia who has been referred for surgical consultation to discuss treatment options for management of severe symptomatic aortic stenosis.  Patient states she has known of presence of a heart murmur for many years.  She suffered a massive stroke in 2011 which left her with residual right-sided hemiparesis.  She apparently had a prolonged rehabilitation.  She was followed for aortic stenosis by a local cardiologist, and approximately 1 year ago she moved to [place] to live near her son.  She has been followed by Dr. Geraldo Pitter since May 2019.  She admits to a long history of exertional shortness of breath.    In October 2019 she had a syncopal episode for which she was evaluated in the emergency department.  Her symptoms were attributed to orthostatic hypotension.  She was not seen by cardiology at that time.    Symptoms of shortness of breath rapidly progressed over the last few weeks  culminating the patient's hospital admission with resting shortness of breath, chest discomfort, and dizziness without syncope.  BNP level was mildly elevated and chest x-ray revealed pulmonary vascular congestion.  She was hospitalized for intravenous diuresis.  Transthoracic echocardiogram performed February 20, 2018 revealed normal left ventricular systolic function with moderate mitral stenosis and severe aortic stenosis with mild aortic insufficiency.  Peak velocity across the aortic valve was measured 4.1 m/s corresponding to mean transvalvular gradient estimated 43 mmHg.  DVI was 0.25 with aortic valve area calculated 0.7 cm.  Mean transvalvular gradient across mitral valve was estimated 9.0 mmHg corresponding to the mitral valve area estimated 1.46 cm using pressure half-time.  Left ventricular function appeared normal with ejection fraction estimated greater than 65%.  The patient was seen in consultation by Dr. Burt Knack and underwent left and right heart catheterization on February 21, 2018.  This revealed mild to moderate nonobstructive coronary artery disease.  There was severe aortic stenosis with peak to peak and mean transvalvular gradients measured 60 and 48 mmHg respectively.  Aortic valve area was calculated 0.96 cm.  The mitral valve was severely calcified.  Mean transvalvular gradient across the mitral valve was measured 10 mmHg corresponding to mitral valve area calculated 1.7 cm.  There was mild pulmonary hypertension and mildly elevated right heart pressures.  Patient was discharged from the hospital and underwent CT angiography.  She was seen by a dentist last week.  She has been cleared for surgical intervention.  Baseline EKG reveals sinus rhythm with RBBB.  During the course of the patient's preoperative work up they have been evaluated comprehensively by a multidisciplinary team of specialists coordinated through  the Multidisciplinary [facility] in the [facility].  They have been demonstrated to suffer from symptomatic severe aortic stenosis as noted above. The patient has been counseled extensively as to the relative risks and benefits of all options for the treatment of severe aortic stenosis including long term medical therapy, conventional surgery for aortic valve replacement, and transcatheter aortic valve replacement.  All questions have been answered, and the patient provides full informed consent for the operation as described.   DETAILS OF THE OPERATIVE PROCEDURE  PREPARATION:    The patient is brought to the operating room on the above mentioned date and central monitoring was established by the anesthesia team including placement of a central venous line and radial arterial line. The patient is placed in the supine position on the operating table.  Intravenous antibiotics are administered. The patient is monitored closely throughout the procedure under conscious sedation.  Baseline transthoracic echocardiogram was performed. The patient's chest, abdomen, both groins, and both lower extremities are prepared and draped in a sterile manner. A time out procedure is performed.   PERIPHERAL ACCESS:    Using the modified Seldinger technique, femoral arterial and venous access was obtained with placement of 6 Fr sheaths on the left side.  A pigtail diagnostic catheter was passed through the left arterial sheath under fluoroscopic guidance into the aortic root.  A temporary transvenous pacemaker catheter was passed through the left femoral venous sheath under fluoroscopic guidance into the right ventricle.  The pacemaker was tested to ensure stable lead placement and pacemaker capture. Aortic root angiography was performed in order to determine the optimal angiographic angle for valve deployment.   TRANSFEMORAL ACCESS:   Percutaneous transfemoral access and sheath placement was performed using ultrasound guidance.  The right common  femoral artery was cannulated using a micropuncture needle and appropriate location was verified using hand injection angiogram.  A pair of Abbott Perclose percutaneous closure devices were placed and a 6 French sheath replaced into the femoral artery.  The patient was heparinized systemically and ACT verified > 250 seconds.    A 14 Fr transfemoral E-sheath was introduced into the right common femoral artery after progressively dilating over an Amplatz superstiff wire. An AL-1 catheter was used to direct a straight-tip exchange length wire across the native aortic valve into the left ventricle. This was exchanged out for a pigtail catheter and position was confirmed in the LV apex. Simultaneous LV and Ao pressures were recorded.  The pigtail catheter was exchanged for an Amplatz Extra-stiff wire in the LV apex.  Echocardiography was utilized to confirm appropriate wire position and no sign of entanglement in the mitral subvalvular apparatus.   TRANSCATHETER HEART VALVE DEPLOYMENT:   An Edwards Sapien 3 transcatheter heart valve (size 23 mm, model #9600TFX, serial #[id number]) was prepared and crimped per manufacturer's guidelines, and the proper orientation of the valve is confirmed on the [employer] delivery system. The valve was advanced through the introducer sheath using normal technique until in an appropriate position in the abdominal aorta beyond the sheath tip. The balloon was then retracted and using the fine-tuning wheel was centered on the valve. The valve was then advanced across the aortic arch using appropriate flexion of the catheter. The valve was carefully positioned across the aortic valve annulus. The Commander catheter was retracted using normal technique. Once final position of the valve has been confirmed by angiographic assessment, the valve is deployed while temporarily holding ventilation and during rapid  ventricular pacing to maintain systolic blood pressure < 50 mmHg and pulse  pressure < 10 mmHg. The balloon inflation is held for >3 seconds after reaching full deployment volume. Once the balloon has fully deflated the balloon is retracted into the ascending aorta and valve function is assessed using echocardiography. There is felt to be no paravalvular leak and no central aortic insufficiency.  The patient's hemodynamic recovery following valve deployment is good, although the patient developed complete heart block.  The deployment balloon and guidewire are both removed.    PROCEDURE COMPLETION:   The sheath was removed and femoral artery closure performed.  Protamine was administered once femoral arterial repair was complete. The pigtail catheters and femoral sheaths were removed with manual pressure used for hemostasis.  The temporary pacing wire was left in place.  The patient tolerated the procedure well and is transported to the surgical intensive care in stable condition. There were no immediate intraoperative complications. All sponge instrument and needle counts are verified correct at completion of the operation.   No blood products were administered during the operation.  The patient received a total of 45 mL of intravenous contrast during the procedure.   [name], MD 02/28/2018 2:24 PM

## 2018-03-01 ENCOUNTER — Inpatient Hospital Stay (HOSPITAL_COMMUNITY): Payer: Medicare Other

## 2018-03-01 ENCOUNTER — Encounter (HOSPITAL_COMMUNITY)
Admission: RE | Disposition: A | Discharge status: Skilled Nursing Facility | Payer: Self-pay | Source: Home / Self Care | Attending: Cardiovascular Disease

## 2018-03-01 ENCOUNTER — Other Ambulatory Visit: Payer: Self-pay

## 2018-03-01 ENCOUNTER — Encounter (HOSPITAL_COMMUNITY): Payer: Self-pay | Admitting: Thoracic Surgery (Cardiothoracic Vascular Surgery)

## 2018-03-01 DIAGNOSIS — I469 Cardiac arrest, cause unspecified: Secondary | ICD-10-CM

## 2018-03-01 DIAGNOSIS — I083 Combined rheumatic disorders of mitral, aortic and tricuspid valves: Secondary | ICD-10-CM | POA: Diagnosis not present

## 2018-03-01 DIAGNOSIS — I4510 Unspecified right bundle-branch block: Secondary | ICD-10-CM | POA: Diagnosis not present

## 2018-03-01 DIAGNOSIS — I5033 Acute on chronic diastolic (congestive) heart failure: Secondary | ICD-10-CM | POA: Diagnosis not present

## 2018-03-01 DIAGNOSIS — R001 Bradycardia, unspecified: Secondary | ICD-10-CM | POA: Diagnosis not present

## 2018-03-01 DIAGNOSIS — I361 Nonrheumatic tricuspid (valve) insufficiency: Secondary | ICD-10-CM | POA: Diagnosis not present

## 2018-03-01 DIAGNOSIS — Z952 Presence of prosthetic heart valve: Secondary | ICD-10-CM

## 2018-03-01 DIAGNOSIS — I442 Atrioventricular block, complete: Secondary | ICD-10-CM | POA: Diagnosis not present

## 2018-03-01 DIAGNOSIS — E039 Hypothyroidism, unspecified: Secondary | ICD-10-CM | POA: Diagnosis not present

## 2018-03-01 DIAGNOSIS — E785 Hyperlipidemia, unspecified: Secondary | ICD-10-CM | POA: Diagnosis not present

## 2018-03-01 DIAGNOSIS — I272 Pulmonary hypertension, unspecified: Secondary | ICD-10-CM | POA: Diagnosis not present

## 2018-03-01 DIAGNOSIS — I2510 Atherosclerotic heart disease of native coronary artery without angina pectoris: Secondary | ICD-10-CM | POA: Diagnosis not present

## 2018-03-01 DIAGNOSIS — I110 Hypertensive heart disease with heart failure: Secondary | ICD-10-CM | POA: Diagnosis not present

## 2018-03-01 DIAGNOSIS — R296 Repeated falls: Secondary | ICD-10-CM | POA: Diagnosis not present

## 2018-03-01 DIAGNOSIS — I69351 Hemiplegia and hemiparesis following cerebral infarction affecting right dominant side: Secondary | ICD-10-CM | POA: Diagnosis not present

## 2018-03-01 HISTORY — PX: TEMPORARY PACEMAKER: CATH118268

## 2018-03-01 LAB — ECHOCARDIOGRAM LIMITED
Height: 61.5 in
Weight: 2960 oz

## 2018-03-01 LAB — CBC
HCT: 23.4 % — ABNORMAL LOW (ref 36.0–46.0)
Hemoglobin: 7.1 g/dL — ABNORMAL LOW (ref 12.0–15.0)
MCH: 27.5 pg (ref 26.0–34.0)
MCHC: 30.3 g/dL (ref 30.0–36.0)
MCV: 90.7 fL (ref 80.0–100.0)
Platelets: 259 10*3/uL (ref 150–400)
RBC: 2.58 MIL/uL — ABNORMAL LOW (ref 3.87–5.11)
RDW: 15.1 % (ref 11.5–15.5)
WBC: 10.9 10*3/uL — ABNORMAL HIGH (ref 4.0–10.5)
nRBC: 0 % (ref 0.0–0.2)

## 2018-03-01 LAB — BASIC METABOLIC PANEL
Anion gap: 4 — ABNORMAL LOW (ref 5–15)
BUN: 25 mg/dL — ABNORMAL HIGH (ref 8–23)
CO2: 22 mmol/L (ref 22–32)
Calcium: 8 mg/dL — ABNORMAL LOW (ref 8.9–10.3)
Chloride: 107 mmol/L (ref 98–111)
Creatinine, Ser: 0.91 mg/dL (ref 0.44–1.00)
GFR calc Af Amer: >60 mL/min (ref >60)
GFR calc non Af Amer: 59 mL/min — ABNORMAL LOW (ref >60)
Glucose, Bld: 187 mg/dL — ABNORMAL HIGH (ref 70–99)
Potassium: 5.9 mmol/L — ABNORMAL HIGH (ref 3.5–5.1)
Sodium: 133 mmol/L — ABNORMAL LOW (ref 135–145)

## 2018-03-01 LAB — MAGNESIUM: Magnesium: 2.1 mg/dL (ref 1.7–2.4)

## 2018-03-01 LAB — POTASSIUM: Potassium: 3.9 mmol/L (ref 3.5–5.1)

## 2018-03-01 LAB — PREPARE RBC (CROSSMATCH)

## 2018-03-01 SURGERY — TEMPORARY PACEMAKER
Anesthesia: LOCAL

## 2018-03-01 MED ORDER — SODIUM CHLORIDE 0.9 % IV SOLN
INTRAVENOUS | Status: AC | PRN
  Administered 2018-03-01: 10 mL/h via INTRAVENOUS

## 2018-03-01 MED ORDER — HEPARIN (PORCINE) IN NACL 1000-0.9 UT/500ML-% IV SOLN
INTRAVENOUS | Status: AC
  Filled 2018-03-01: qty 500

## 2018-03-01 MED ORDER — SODIUM CHLORIDE 0.9% IV SOLUTION: Freq: Once | INTRAVENOUS | Status: DC

## 2018-03-01 MED ORDER — DOPAMINE-DEXTROSE 3.2-5 MG/ML-% IV SOLN
INTRAVENOUS | Status: AC
  Administered 2018-03-01: 16:00:00
  Filled 2018-03-01: qty 250

## 2018-03-01 MED ORDER — DOPAMINE-DEXTROSE 3.2-5 MG/ML-% IV SOLN
INTRAVENOUS | Status: AC | PRN
  Administered 2018-03-01: 15 ug/kg/min via INTRAVENOUS

## 2018-03-01 MED ORDER — SODIUM CHLORIDE 0.9 % IV SOLN
250.0000 mL | INTRAVENOUS | Status: DC | PRN
  Administered 2018-03-01 – 2018-03-02 (×3): 250 mL via INTRAVENOUS

## 2018-03-01 MED ORDER — LIDOCAINE HCL (PF) 1 % IJ SOLN
INTRAMUSCULAR | Status: DC | PRN
  Administered 2018-03-01: 15 mL

## 2018-03-01 MED ORDER — NOREPINEPHRINE 4 MG/250ML-% IV SOLN
INTRAVENOUS | Status: AC
  Administered 2018-03-01: 2 ug/min via INTRAVENOUS
  Filled 2018-03-01: qty 250

## 2018-03-01 MED ORDER — SODIUM CHLORIDE 0.9% FLUSH: 3.0000 mL | INTRAVENOUS | Status: DC | PRN

## 2018-03-01 MED ORDER — FENTANYL CITRATE (PF) 100 MCG/2ML IJ SOLN
INTRAMUSCULAR | Status: AC
  Administered 2018-03-01: 25 ug
  Filled 2018-03-01: qty 2

## 2018-03-01 MED ORDER — LIDOCAINE HCL (PF) 1 % IJ SOLN
INTRAMUSCULAR | Status: AC
  Filled 2018-03-01: qty 30

## 2018-03-01 MED ORDER — SODIUM CHLORIDE 0.9% FLUSH
3.0000 mL | Freq: Two times a day (BID) | INTRAVENOUS | Status: DC
  Administered 2018-03-01 – 2018-03-03 (×4): 3 mL via INTRAVENOUS

## 2018-03-01 MED ORDER — HEPARIN (PORCINE) IN NACL 1000-0.9 UT/500ML-% IV SOLN
INTRAVENOUS | Status: DC | PRN
  Administered 2018-03-01: 500 mL

## 2018-03-01 MED ORDER — FUROSEMIDE 20 MG PO TABS
20.0000 mg | ORAL_TABLET | Freq: Every day | ORAL | Status: DC
  Administered 2018-03-01 – 2018-03-02 (×2): 20 mg via ORAL
  Filled 2018-03-01 (×2): qty 1

## 2018-03-01 MED ORDER — ATROPINE SULFATE 1 MG/10ML IJ SOSY
PREFILLED_SYRINGE | INTRAMUSCULAR | Status: AC
  Filled 2018-03-01: qty 10

## 2018-03-01 MED ORDER — NOREPINEPHRINE 4 MG/250ML-% IV SOLN
0.0000 ug/min | INTRAVENOUS | Status: DC
  Administered 2018-03-01: 2 ug/min via INTRAVENOUS

## 2018-03-01 MED FILL — Magnesium Sulfate Inj 50%: INTRAMUSCULAR | Qty: 10 | Status: AC

## 2018-03-01 MED FILL — Potassium Chloride Inj 2 mEq/ML: INTRAVENOUS | Qty: 40 | Status: AC

## 2018-03-01 MED FILL — Heparin Sodium (Porcine) Inj 1000 Unit/ML: INTRAMUSCULAR | Qty: 30 | Status: AC

## 2018-03-01 SURGICAL SUPPLY — 9 items
CABLE ADAPT CONN TEMP 6FT (ADAPTER) ×2 IMPLANT
CATH S G BIP PACING (CATHETERS) ×2 IMPLANT
HOVERMATT SINGLE USE (MISCELLANEOUS) ×2 IMPLANT
PACK CARDIAC CATHETERIZATION (CUSTOM PROCEDURE TRAY) ×2 IMPLANT
PROTECTION STATION PRESSURIZED (MISCELLANEOUS) ×2
SHEATH PINNACLE 6F 10CM (SHEATH) ×2 IMPLANT
SHEATH PROBE COVER 6X72 (BAG) ×2 IMPLANT
SLEEVE REPOSITIONING LENGTH 30 (MISCELLANEOUS) ×2 IMPLANT
STATION PROTECTION PRESSURIZED (MISCELLANEOUS) ×1 IMPLANT

## 2018-03-01 NOTE — Anesthesia Postprocedure Evaluation (Signed)
Anesthesia Post Note  Patient: Julie Mcgee  Procedure(s) Performed: TRANSCATHETER AORTIC VALVE REPLACEMENT, TRANSFEMORAL (N/A ) TRANSESOPHAGEAL ECHOCARDIOGRAM (TEE) (N/A )     Patient location during evaluation: Cath Lab Anesthesia Type: MAC Level of consciousness: awake and alert Pain management: pain level controlled Vital Signs Assessment: post-procedure vital signs reviewed and stable Respiratory status: spontaneous breathing, nonlabored ventilation, respiratory function stable and patient connected to nasal cannula oxygen Cardiovascular status: stable and blood pressure returned to baseline Postop Assessment: no apparent nausea or vomiting Anesthetic complications: no    Last Vitals:  Vitals:   03/01/18 0545 03/01/18 0600  BP:  (!) 118/44  Pulse: 70 67  Resp: 18 18  Temp:    SpO2: 93% 91%    Last Pain:  Vitals:   03/01/18 0400  TempSrc: Oral  PainSc:                  Spenser Cong,W. EDMOND

## 2018-03-01 NOTE — Progress Notes (Addendum)
[place] VALVE TEAM  [name] Date of Encounter: 03/01/2018  Primary Cardiologist: Dr. Geraldo Pitter (wants to switch to Dr. Radford Pax) / Dr. Burt Knack & Dr. Roxy Manns (TAVR)  Hospital Problem List     Principal Problem:   S/P TAVR (transcatheter aortic valve replacement) Active Problems:   Hypertension   Hyperlipidemia   Hypothyroidism   Severe aortic stenosis   History of stroke   Unstable gait   Recurrent falls   RBBB   Chronic diastolic CHF (congestive heart failure) (HCC)     Subjective   Feeling well. Wants all her lines taken out. No complaints. Still on bedrest.  Inpatient Medications    Scheduled Meds: . sodium chloride   Intravenous Once  . aspirin  81 mg Oral Daily  . atorvastatin  40 mg Oral q1800  . clopidogrel  75 mg Oral Q breakfast  . levothyroxine  100 mcg Oral QAC breakfast  . sodium chloride flush  3 mL Intravenous Q12H   Continuous Infusions: . sodium chloride    . cefUROXime (ZINACEF)  IV Stopped (03/01/18 0056)  . nitroGLYCERIN    . phenylephrine (NEO-SYNEPHRINE) Adult infusion 20 mcg/min (03/01/18 0320)   PRN Meds: sodium chloride, acetaminophen **OR** acetaminophen, metoprolol tartrate, morphine injection, ondansetron (ZOFRAN) IV, oxyCODONE, sodium chloride flush, traMADol   Vital Signs    Vitals:   03/01/18 1015 03/01/18 1031 03/01/18 1045 03/01/18 1100  BP:  (!) 110/43 (!) 109/38 (!) 114/36  Pulse:   79 79  Resp: (!) 26 18 (!) 25 (!) 23  Temp:  98.3 F (36.8 C)    TempSrc:  Oral    SpO2:   95% 94%  Weight:      Height:        Intake/Output Summary (Last 24 hours) at 03/01/2018 1123 Last data filed at 03/01/2018 1100 Gross per 24 hour  Intake 2830.97 ml  Output 750 ml  Net 2080.97 ml   Filed Weights   02/28/18 1035  Weight: 83.9 kg    Physical Exam    GEN: Well nourished, well developed, in no acute distress. obese HEENT: Grossly normal.  Neck: Supple, no JVD, carotid  bruits, or masses. Cardiac: RRR, soft flow murmur. No rubs, or gallops. No clubbing, cyanosis, edema.  Radials/DP/PT 2+ and equal bilaterally.  Respiratory:  Respirations regular and unlabored, clear to auscultation bilaterally. GI: Soft, nontender, nondistended, BS + x 4. MS: no deformity or atrophy. Skin: warm and dry, no rash. Groins healing well. Temp wire removed.  Neuro:  Strength and sensation are intact. Psych: AAOx3.  Normal affect.  Labs    CBC Recent Labs    02/27/18 1505  02/28/18 1531 03/01/18 0228  WBC 10.4  --   --  10.9*  HGB 8.7*   < > 7.8* 7.1*  HCT 28.2*   < > 23.0* 23.4*  MCV 88.4  --   --  90.7  PLT 357  --   --  259   < > = values in this interval not displayed.   Basic Metabolic Panel Recent Labs    02/27/18 1505  02/28/18 1430 02/28/18 1531 02/28/18 1540 03/01/18 0228 03/01/18 0806  NA 137   < > 140 140  --  133*  --   K 4.3   < > 4.1 4.3  --  5.9* 3.9  CL 105  --   --   --   --  107  --   CO2 19*  --   --   --   --  22  --   GLUCOSE 100*   < > 117*  --   --  187*  --   BUN 28*  --   --   --   --  25*  --   CREATININE 0.91  --   --   --  0.70 0.91  --   CALCIUM 9.3  --   --   --   --  8.0*  --   MG  --   --   --   --   --  2.1  --    < > = values in this interval not displayed.   Liver Function Tests Recent Labs    02/27/18 1505  AST 18  ALT 12  ALKPHOS 68  BILITOT 0.6  PROT 6.9  ALBUMIN 3.7   No results for input(s): LIPASE, AMYLASE in the last 72 hours. Cardiac Enzymes No results for input(s): CKTOTAL, CKMB, CKMBINDEX, TROPONINI in the last 72 hours. BNP Invalid input(s): POCBNP D-Dimer No results for input(s): DDIMER in the last 72 hours. Hemoglobin A1C Recent Labs    02/27/18 1506  HGBA1C 5.5   Fasting Lipid Panel No results for input(s): CHOL, HDL, LDLCALC, TRIG, CHOLHDL, LDLDIRECT in the last 72 hours. Thyroid Function Tests No results for input(s): TSH, T4TOTAL, T3FREE, THYROIDAB in the last 72 hours.  Invalid  input(s): FREET3  Telemetry    Sinus, no pacing.  - Personally Reviewed  ECG    Sinus with 1st deg AV block and RBBB - Personally Reviewed  Radiology    Dg Chest Port 1 View  Result Date: 02/28/2018 CLINICAL DATA:  Status post transcatheter aortic valve replacement. EXAM: PORTABLE CHEST 1 VIEW COMPARISON:  Radiographs of February 20, 2018. FINDINGS: Stable cardiomegaly. Atherosclerosis of thoracic aorta is noted. Aortic valve prosthesis is noted. Interval placement of right internal jugular catheter with tip in expected position of the SVC. No pneumothorax or pleural effusion is noted. Mild Kerley B lines are noted in both lungs suggesting possible minimal pulmonary edema. Bony thorax is unremarkable. IMPRESSION: Mild Kerley B lines are noted in both lungs suggesting possible minimal pulmonary edema. No pneumothorax or pleural effusion is noted. Aortic Atherosclerosis (ICD10-I70.0). Electronically Signed   By: Marijo Conception, M.D.   On: 02/28/2018 18:50    Cardiac Studies   TAVR OPERATIVE NOTE   Date of Procedure:                02/28/2018  Preoperative Diagnosis:      Severe Aortic Stenosis   Postoperative Diagnosis:    Same   Procedure:        Transcatheter Aortic Valve Replacement - Percutaneous Right Transfemoral Approach             Edwards Sapien 3 THV (size 23 mm, model # 9600TFX, serial # [id number])              Co-Surgeons:                        Valentina Gu. Roxy Manns, MD and Sherren Mocha, MD  Anesthesiologist:                  Arabella Merles, MD  Echocardiographer:              Jenkins Rouge, MD  Pre-operative Echo Findings: ? Severe aortic stenosis ? Mild aortic insufficiency ? Moderate mitral stenosis ? Normal left ventricular systolic function  Post-operative Echo Findings: ? No  paravalvular leak ? Normal left ventricular systolic function   __________________    Echo 03/01/18: pending  Patient Profile     Julie Mcgee is a 85 y.o.  female with a hx of HTN, HLD, RBBB, previous CVA with residual R hemiparesis, carotid artery disease, moderate mitral stenosis, chronic diastolic CHF with recent admission and severe AS who presented to [facility] on 02/28/18 for planned TAVR.  Assessment & Plan    Severe AS:s/p successful TAVR with a21mm Edwards Sapien 3 THV via the TF approach on 02/28/18. Post operative echocompleted butpendingformal read. Groin sites are stable. She had some post op CHB and temp wire was left in. ECG shows old RBBB with 1st deg AV block but no HAVB and not pacing. Temp wire removed. Continue Asprin andPlavix. Plan to transfer to 4E today.   Acute on chronic anemia: Hg 8.5--> 7.1 Transfuse with 1 PRBC today  Acute on chronic diastolic CHF: as evidenced by admission last week for acute CHF, persistently elevated BNP ~1000 on pre admission lab work and mild pulmonary vascular congestion on CXR. This has been treated with TAVR. I will also resume lasix 20mg  daily.   Hyperkalemia: erroneous or related to hemolysis. Repeat lab was normal.   HTN: BP is normal and off pressors.   SignedAngelena Form, PA-C  03/01/2018, 11:23 AM  Pager [phone]  Patient seen, examined. Available data reviewed. Agree with findings, assessment, and plan as outlined by Nell Range, PA-C. Pt seen this am independently. On my exam she is alert, oriented, in NAD. Lungs CTA. Heart RRR 2/6 SEM at the RUSB, no diastolic murmur. BL groin sites sites clear. Femoral venous sheath and temp wire in place. No edema. Tele reviewed and shows sinus rhythm with RBBB pattern unchanged from preop. No heart block or pacing. HgB 7.1 mg/dL. Plan as above - DC temp wire and venous sheath. DC art line. Transfuse 1 unit PRBC's.   ADDENDUM: this afternoon patient developed asystolic arrest requiring CPR and epi with return to baseline LOC. Hospital team called to evaluate patient emergently and taken to cath lab for placement of temporoary transvenous  pacing wire. Plan for EP team to place permanent pacemaker tomorrow.   Sherren Mocha, M.D. 03/01/2018 8:50 PM

## 2018-03-01 NOTE — Consult Note (Addendum)
Cardiology Consultation:   Patient ID: Julie Mcgee MRN: [id number]; DOB: 1933/05/29  Admit date: 02/28/2018 Date of Consult: 03/01/2018  Primary Care Provider: Lyman Bishop, DO Primary Cardiologist: Jenean Lindau, MD  Structual heart/TAVR: Dr. Burt Knack Primary Electrophysiologist:  None    Patient Profile:   Julie Mcgee is a 85 y.o. female with a hx of RBBB, HTN, HLD, stroke (maintained on warfarin though recently stopped 2/2 falls and unsteady gait, I do not see any h/o AF), VHF w/mod MS, and severe AS who is being seen today for the evaluation of CHB, asystole at the request of Dr. Burt Knack.  History of Present Illness:   Julie Mcgee was admitted to [facility] with worsening SOB, DOE, CHF.  Work up included Echocardiogram on 02/20/2018 showed normal LV function with moderate mitral stenosis and severe aortic stenosis with mild aortic regurgitation (mean gradient 43.5, peak gradient 68.6, DVI 0.25, AVA 0.70 cm.)  Cardiology was consulted and recommended [facility] which was completed today by Dr. Burt Knack.  This showed mild to moderate nonobstructive CAD with severe calcific aortic stenosis with a mean/peak gradient of 48/60 mmHg and AVA of 0.96 cm, normal LVEDP, mild pulm HTN and mildly elevated right heart pressures.    She was referred for evaluation of AVR, with plans to under go aTAVR.  She was admitted yesterday for TAVR procedure which she had, noting during deployment of the valve she developed CHB. Temp pacing wire was maintained post procedure.  Yesterday post procedure and through the evening she maintained SR with 1:1 conduction and temp wore was pulled.   This afternoon the patient c/o abdominal discomfort, developed what was reported as asystole getting 2 minutes of CPR and a dose of epi with recovery of HR/pulse.  She was brought to the cath lab for temp wire, EP is asked to see her for consideratio of PPM implant with a known RBBB at baseline pre-TAVR.  LABS today K+  3.9 BUN/Creat 25/0.91 Mag 2.1 WBC 10.9 H/H 7.1/23.4 (stable from yesterday) Plts 259  Past Medical History:  Diagnosis Date  . Arthritis   . Carotid artery stenosis   . Chronic diastolic CHF (congestive heart failure) ([place])   . Former smoker    1/2 ppd for 45 years  . 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 Hyperlipidemia   . Hypertension   . Hypothyroidism   . Peripheral vascular disease ([place])   . RBBB   . 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 Severe aortic stenosis     Past Surgical History:  Procedure Laterality Date  . BASAL CELL CARCINOMA EXCISION  2018  . CARDIAC CATHETERIZATION    . COLONOSCOPY  2001  . HIP SURGERY  2005  . KNEE SURGERY  1998  . RIGHT/LEFT HEART CATH AND CORONARY ANGIOGRAPHY N/A 02/21/2018   Procedure: RIGHT/LEFT HEART CATH AND CORONARY ANGIOGRAPHY;  Surgeon: Sherren Mocha, MD;  Location: [place] [facility];  Service: Cardiovascular;  Laterality: N/A;  . TEE WITHOUT CARDIOVERSION N/A 02/28/2018   Procedure: TRANSESOPHAGEAL ECHOCARDIOGRAM (TEE);  Surgeon: Sherren Mocha, MD;  Location: [place] [facility];  Service: Open Heart Surgery;  Laterality: N/A;  . TONSILLECTOMY    . TRANSCATHETER AORTIC VALVE REPLACEMENT, TRANSFEMORAL N/A 02/28/2018   Procedure: TRANSCATHETER AORTIC VALVE REPLACEMENT, TRANSFEMORAL;  Surgeon: Sherren Mocha, MD;  Location: [place] [facility];  Service: Open  Heart Surgery;  Laterality: N/A;     Home Medications:  Prior to Admission medications   Medication Sig Start Date End Date Taking? Authorizing Provider  aspirin EC 325 MG tablet Take 325 mg by mouth daily.   Yes [provider]  Calcium Carb-Cholecalciferol (CALTRATE 600+D3 PO) Take 1 tablet by mouth 2 (two) times daily.   Yes [provider]  furosemide (LASIX) 40 MG tablet Take 1  tablet (40 mg total) by mouth daily for 30 days. 02/22/18 03/24/18 Yes Arrien, Jimmy Picket, MD  levothyroxine (SYNTHROID, LEVOTHROID) 100 MCG tablet Take 100 mcg by mouth daily before breakfast.   Yes [provider]  metoprolol tartrate (LOPRESSOR) 50 MG tablet Take 50 mg by mouth 2 (two) times daily.   Yes [provider]  Multiple Vitamins-Minerals (MULTIVITAMIN ADULTS 50+) TABS Take 1 tablet by mouth daily.   Yes [provider]  potassium chloride SA (K-DUR,KLOR-CON) 20 MEQ tablet Take 1 tablet (20 mEq total) by mouth daily for 30 days. 02/22/18 03/24/18 Yes Arrien, Jimmy Picket, MD  simvastatin (ZOCOR) 80 MG tablet Take 80 mg by mouth every evening.   Yes [provider]    Inpatient Medications: Scheduled Meds: . sodium chloride   Intravenous Once  . aspirin  81 mg Oral Daily  . atorvastatin  40 mg Oral q1800  . clopidogrel  75 mg Oral Q breakfast  . fentaNYL      . furosemide  20 mg Oral Daily  . levothyroxine  100 mcg Oral QAC breakfast  . sodium chloride flush  3 mL Intravenous Q12H   Continuous Infusions: . sodium chloride    . cefUROXime (ZINACEF)  IV Stopped (03/01/18 0056)  . DOPamine    . nitroGLYCERIN    . phenylephrine (NEO-SYNEPHRINE) Adult infusion 20 mcg/min (03/01/18 0320)   PRN Meds: sodium chloride, acetaminophen **OR** acetaminophen, metoprolol tartrate, morphine injection, ondansetron (ZOFRAN) IV, oxyCODONE, sodium chloride flush, traMADol  Allergies:    Allergies  Allergen Reactions  . Adhesive [Tape] Other (See Comments)    Causes BLISTERS    Social History:   Social History   Socioeconomic History  . Marital status: Married    Spouse name: Not on file  . Number of children: Not on file  . Years of education: Not on file  . Highest education level: Not on file  Occupational History  . Not on file  Social Needs  . Financial resource strain: Not on file  . Food insecurity:    Worry: Not on file     Inability: Not on file  . Transportation needs:    Medical: Not on file    Non-medical: Not on file  Tobacco Use  . Smoking status: Former Smoker    Packs/day: 1.00    Years: 45.00    Pack years: 45.00    Types: Cigarettes    Last attempt to quit: 05/23/2009    Years since quitting: 8.7  . Smokeless tobacco: Never Used  Substance and Sexual Activity  . Alcohol use: Not Currently  . Drug use: Never  . Sexual activity: Not on file  Lifestyle  . Physical activity:    Days per week: Not on file    Minutes per session: Not on file  . Stress: Not on file  Relationships  . Social connections:    Talks on phone: Not on file    Gets together: Not on file    Attends religious service: Not on file    Active member  of club or organization: Not on file    Attends meetings of clubs or organizations: Not on file    Relationship status: Not on file  . Intimate partner violence:    Fear of current or ex partner: Not on file    Emotionally abused: Not on file    Physically abused: Not on file    Forced sexual activity: Not on file  Other Topics Concern  . Not on file  Social History Narrative  . Not on file    Family History:   Family History  Problem Relation Age of Onset  . Heart failure Mother   . Chronic Renal Failure Mother   . Alcohol abuse Father      ROS:  Please see the history of present illness.  All other ROS reviewed and negative.     Physical Exam/Data:   Vitals:   03/01/18 1200 03/01/18 1300 03/01/18 1400 03/01/18 1500  BP: (!) 117/42 (!) 110/34 (!) 117/39   Pulse: 73 82    Resp: 20 15 (!) 25   Temp:  98.6 F (37 C)    TempSrc:  Oral    SpO2: 97% 96%    Weight:    89.2 kg  Height:        Intake/Output Summary (Last 24 hours) at 03/01/2018 1615 Last data filed at 03/01/2018 1500 Gross per 24 hour  Intake 1948.97 ml  Output 1250 ml  Net 698.97 ml   Last 3 Weights 03/01/2018 03/01/2018 02/28/2018  Weight (lbs) 196 lb 10.4 oz (No Data) 185 lb  Weight  (kg) 89.2 kg (No Data) 83.915 kg     Body mass index is 36.56 kg/m.  General:  Well nourished, well developed, in no acute distress HEENT: normal Lymph: no adenopathy Neck: no JVD Endocrine:  No thryomegaly Vascular: No carotid bruits Cardiac: RRR tachycardic; no murmurs, gallops or rubs Lungs:  CTA bilaterally (an/lat auscultation only), no wheezing, rhonchi or rales  Abd: soft, nontender Ext: no edema Musculoskeletal:  No deformities Skin: warm and dry  Neuro:  No gross focal abnormalities noted Psych:  Normal affect   EKG:  The EKG was personally reviewed and demonstrates:   02/28/2018 SR, 1st degree AVblock, RBBB, 60bpm 03/01/2018 is CHB, RBBB, 52bpm  02/18/2018 is SR, RBBB, bordeline 1st degree AVblock  Telemetry:  Telemetry was personally reviewed and demonstrates:   SB/SR 50's-60" >> CHB w/V standstill >> CHB w/V rates 50's >> returns from cath lab in ST 110's-120's  Relevant CV Studies:  03/01/2018: TTE IMPRESSIONS  1. The left ventricle has normal systolic function, with an ejection fraction of 60-65%. The cavity size was normal. There is moderately increased left ventricular wall thickness.  2. Left atrial size was severely dilated.  3. Mildly thickened tricuspid valve leaflets.  4. The mitral valve is rheumatic. Severe thickening of the mitral valve leaflet. Severe calcification of the mitral valve leaflet. There is severe mitral annular calcification present. Moderate mitral valve stenosis.  5. The tricuspid valve was normal in structure. Tricuspid valve regurgitation is moderate.  6. Post TAVR with 23 mm Sapien 3 valve Not well visualized no PVL gradients have increased considerably since implant 02/28/18.  02/20/2018: TTE IMPRESSIONS  1. The left ventricle has hyperdynamic systolic function of >15%. The cavity size was normal. There is mildly increased left ventricular wall thickness. Echo evidence of pseudonormalization in diastolic relaxation Elevated mean left  atrial pressure.  2. The right ventricle has normal systolic function. The cavity was normal. There  is no increase in right ventricular wall thickness.  3. Left atrial size was severely dilated.  4. The mitral valve is rheumatic. There is moderate thickening and moderate calcification. There is severe mitral annular calcification present. moderate mitral valve stenosis.  5. The tricuspid valve is normal in structure.  6. The aortic valve is tricuspid There is severe thickening and severe calcifcation of the aortic valve. Aortic valve regurgitation is mild to moderate by color flow Doppler. The calculated aortic valve area is 0.71 cm, consistent with severe stenosis.  7. The inferior vena cava was dilated in size with >50% respiratory variability.  8. AV is thickened, calcified with restricted motion. Peak and mean gradients through the valve are 69 and 44 mm Hg repectively consistent with severe AS. FINDINGS  Left Ventricle: The left ventricle has hyperdynamic systolic function of >16%. The cavity size was normal. There is mildly increased left ventricular wall thickness. Echo evidence of pseudonormalization in diastolic relaxation Elevated mean left atrial    02/21/2018; LHC 1.  Mild to moderate focal proximal LAD stenosis at the origin of the first diagonal branch, does not appear flow obstructive 2.  Severe calcific aortic stenosis with a peak to peak gradient of 60 mmHg, mean gradient 48 mmHg, and calculated valve area 0.96 cm. 3.  Severe mitral annular calcification biplane fluoroscopy with moderate mitral stenosis present, mean gradient 10 mmHg, calculated valve area 1.7 cm 4.  Normal LVEDP, mild pulmonary hypertension, and mildly elevated right heart pressures  Recommendations: Medical therapy for nonobstructive CAD, continued evaluation for consideration of TAVR and treatment of this patient's valvular heart disease.  Anticipate moving forward with CTA studies of the heart and  chest/abdomen/pelvis followed by cardiac surgical consultation as part of a multidisciplinary evaluation.   Laboratory Data:  Chemistry Recent Labs  Lab 02/27/18 1505 02/28/18 1314 02/28/18 1430 02/28/18 1531 02/28/18 1540 03/01/18 0228 03/01/18 0806  NA 137 140 140 140  --  133*  --   K 4.3 4.1 4.1 4.3  --  5.9* 3.9  CL 105  --   --   --   --  107  --   CO2 19*  --   --   --   --  22  --   GLUCOSE 100* 97 117*  --   --  187*  --   BUN 28*  --   --   --   --  25*  --   CREATININE 0.91  --   --   --  0.70 0.91  --   CALCIUM 9.3  --   --   --   --  8.0*  --   GFRNONAA 59*  --   --   --   --  59*  --   GFRAA >60  --   --   --   --  >60  --   ANIONGAP 13  --   --   --   --  4*  --     Recent Labs  Lab 02/27/18 1505  PROT 6.9  ALBUMIN 3.7  AST 18  ALT 12  ALKPHOS 68  BILITOT 0.6   Hematology Recent Labs  Lab 02/27/18 1505  02/28/18 1430 02/28/18 1531 03/01/18 0228  WBC 10.4  --   --   --  10.9*  RBC 3.19*  --   --   --  2.58*  HGB 8.7*   < > 7.1* 7.8* 7.1*  HCT 28.2*   < >  21.0* 23.0* 23.4*  MCV 88.4  --   --   --  90.7  MCH 27.3  --   --   --  27.5  MCHC 30.9  --   --   --  30.3  RDW 15.2  --   --   --  15.1  PLT 357  --   --   --  259   < > = values in this interval not displayed.   Cardiac EnzymesNo results for input(s): TROPONINI in the last 168 hours. No results for input(s): TROPIPOC in the last 168 hours.  BNP Recent Labs  Lab 02/27/18 1505  BNP 1,020.2*    DDimer No results for input(s): DDIMER in the last 168 hours.  Radiology/Studies:   Dg Chest Port 1 View Result Date: 02/28/2018 CLINICAL DATA:  Status post transcatheter aortic valve replacement. EXAM: PORTABLE CHEST 1 VIEW COMPARISON:  Radiographs of February 20, 2018. FINDINGS: Stable cardiomegaly. Atherosclerosis of thoracic aorta is noted. Aortic valve prosthesis is noted. Interval placement of right internal jugular catheter with tip in expected position of the SVC. No pneumothorax or  pleural effusion is noted. Mild Kerley B lines are noted in both lungs suggesting possible minimal pulmonary edema. Bony thorax is unremarkable. IMPRESSION: Mild Kerley B lines are noted in both lungs suggesting possible minimal pulmonary edema. No pneumothorax or pleural effusion is noted. Aortic Atherosclerosis (ICD10-I70.0). Electronically Signed   By: Marijo Conception, M.D.   On: 02/28/2018 18:50    Assessment and Plan:   1. CHB s/p TAVR     Baseline RBBB  Patient intra-procedure had CHB that initially regained 1:1 conduction.  This afternoon had CHB w/v standtill, prolonged pause/bradycardia treated with epi, CPR >> dopamine >> temp pacing wire  PPM implant planned for tomorrow I have discussed with the patient and her family at bedside, implant procedure, potential risks and benefits, the patient is agreeable to proceed   For questions or updates, please contact [facility] Please consult [email or web address] for contact info under     Signed, Baldwin Jamaica, PA-C  03/01/2018 4:15 PM  EP Attending  Patient seen and examined. Agree with the findings as noted above by Tommye Standard, PA-C. The patient is a pleasant 85 yo woman with aortic stenosis and baseline RBBB who underwent TAVR. She had transient AV block during the procedure but then had return of her AV conduction until 2/19 when she had a prolonged episode of asystole due to CHB with no escape. She received CPR, followed by insertion of a temporary PPM. I have discussed the situation with Dr. Billee Cashing. She will undergo PPM insertion. I have reviewed the indications/risks/benefits/goals/expectations of the procedure and she wishes to proceed.  Mikle Bosworth.D.

## 2018-03-01 NOTE — Code Documentation (Addendum)
Responded to bedside.  On unit when code called.  Patient informed RN that she was having abdominal pain.  After, she became altered with rhythm change > per RN went into asystole.  CPR immediately started.  1 EPI given.  Patient began to have spontaneous movement, CPR stopped (approx 2 minutes CPR). Initial rhythm post CPR was bradycardia with possible ST depression on bedside monitor.  Pacing pads in place.  Pt awake, alert / oriented post event.  MAE.  Denies chest, abdominal pain.   Plan: Notified Dr. Roxy Manns, Dr. Burt Knack of events  Dopamine gtt to keep HR >50  STAT EKG  NRB O2, wean down for sats >90% Defer further care to Cardiology  Noe Gens, NP-C [facility] Pgr: [phone] or if no answer [phone] 03/01/2018, 4:00 PM

## 2018-03-01 NOTE — Progress Notes (Signed)
  Echocardiogram 2D Echocardiogram has been performed.  Julie Mcgee 03/01/2018, 11:55 AM

## 2018-03-01 NOTE — Progress Notes (Signed)
NS at 89cc/he  / piggyback with dopamine

## 2018-03-02 ENCOUNTER — Encounter (HOSPITAL_COMMUNITY)
Admission: RE | Disposition: A | Discharge status: Skilled Nursing Facility | Payer: Self-pay | Source: Home / Self Care | Attending: Cardiovascular Disease

## 2018-03-02 ENCOUNTER — Inpatient Hospital Stay (HOSPITAL_COMMUNITY): Payer: Medicare Other

## 2018-03-02 ENCOUNTER — Encounter (HOSPITAL_COMMUNITY): Payer: Self-pay | Admitting: Cardiovascular Disease

## 2018-03-02 DIAGNOSIS — M6281 Muscle weakness (generalized): Secondary | ICD-10-CM | POA: Diagnosis not present

## 2018-03-02 DIAGNOSIS — R0989 Other specified symptoms and signs involving the circulatory and respiratory systems: Secondary | ICD-10-CM | POA: Diagnosis not present

## 2018-03-02 DIAGNOSIS — I272 Pulmonary hypertension, unspecified: Secondary | ICD-10-CM | POA: Diagnosis not present

## 2018-03-02 DIAGNOSIS — Z952 Presence of prosthetic heart valve: Secondary | ICD-10-CM | POA: Diagnosis not present

## 2018-03-02 DIAGNOSIS — I110 Hypertensive heart disease with heart failure: Secondary | ICD-10-CM | POA: Diagnosis not present

## 2018-03-02 DIAGNOSIS — Z7982 Long term (current) use of aspirin: Secondary | ICD-10-CM | POA: Diagnosis not present

## 2018-03-02 DIAGNOSIS — R279 Unspecified lack of coordination: Secondary | ICD-10-CM | POA: Diagnosis not present

## 2018-03-02 DIAGNOSIS — I739 Peripheral vascular disease, unspecified: Secondary | ICD-10-CM | POA: Diagnosis present

## 2018-03-02 DIAGNOSIS — I350 Nonrheumatic aortic (valve) stenosis: Secondary | ICD-10-CM

## 2018-03-02 DIAGNOSIS — M1990 Unspecified osteoarthritis, unspecified site: Secondary | ICD-10-CM | POA: Diagnosis present

## 2018-03-02 DIAGNOSIS — Z85828 Personal history of other malignant neoplasm of skin: Secondary | ICD-10-CM | POA: Diagnosis not present

## 2018-03-02 DIAGNOSIS — R413 Other amnesia: Secondary | ICD-10-CM | POA: Diagnosis present

## 2018-03-02 DIAGNOSIS — Z48812 Encounter for surgical aftercare following surgery on the circulatory system: Secondary | ICD-10-CM | POA: Diagnosis not present

## 2018-03-02 DIAGNOSIS — I442 Atrioventricular block, complete: Secondary | ICD-10-CM | POA: Diagnosis not present

## 2018-03-02 DIAGNOSIS — Z9181 History of falling: Secondary | ICD-10-CM | POA: Diagnosis not present

## 2018-03-02 DIAGNOSIS — I469 Cardiac arrest, cause unspecified: Secondary | ICD-10-CM | POA: Diagnosis not present

## 2018-03-02 DIAGNOSIS — Z841 Family history of disorders of kidney and ureter: Secondary | ICD-10-CM | POA: Diagnosis not present

## 2018-03-02 DIAGNOSIS — I5032 Chronic diastolic (congestive) heart failure: Secondary | ICD-10-CM

## 2018-03-02 DIAGNOSIS — E039 Hypothyroidism, unspecified: Secondary | ICD-10-CM | POA: Diagnosis not present

## 2018-03-02 DIAGNOSIS — I4510 Unspecified right bundle-branch block: Secondary | ICD-10-CM | POA: Diagnosis not present

## 2018-03-02 DIAGNOSIS — Z743 Need for continuous supervision: Secondary | ICD-10-CM | POA: Diagnosis not present

## 2018-03-02 DIAGNOSIS — R41841 Cognitive communication deficit: Secondary | ICD-10-CM | POA: Diagnosis not present

## 2018-03-02 DIAGNOSIS — Z006 Encounter for examination for normal comparison and control in clinical research program: Secondary | ICD-10-CM | POA: Diagnosis not present

## 2018-03-02 DIAGNOSIS — E875 Hyperkalemia: Secondary | ICD-10-CM | POA: Diagnosis present

## 2018-03-02 DIAGNOSIS — E785 Hyperlipidemia, unspecified: Secondary | ICD-10-CM | POA: Diagnosis not present

## 2018-03-02 DIAGNOSIS — Z8249 Family history of ischemic heart disease and other diseases of the circulatory system: Secondary | ICD-10-CM | POA: Diagnosis not present

## 2018-03-02 DIAGNOSIS — I083 Combined rheumatic disorders of mitral, aortic and tricuspid valves: Secondary | ICD-10-CM | POA: Diagnosis not present

## 2018-03-02 DIAGNOSIS — R278 Other lack of coordination: Secondary | ICD-10-CM | POA: Diagnosis not present

## 2018-03-02 DIAGNOSIS — D649 Anemia, unspecified: Secondary | ICD-10-CM | POA: Diagnosis present

## 2018-03-02 DIAGNOSIS — Z7901 Long term (current) use of anticoagulants: Secondary | ICD-10-CM | POA: Diagnosis not present

## 2018-03-02 DIAGNOSIS — R296 Repeated falls: Secondary | ICD-10-CM | POA: Diagnosis not present

## 2018-03-02 DIAGNOSIS — I2510 Atherosclerotic heart disease of native coronary artery without angina pectoris: Secondary | ICD-10-CM | POA: Diagnosis not present

## 2018-03-02 DIAGNOSIS — I5033 Acute on chronic diastolic (congestive) heart failure: Secondary | ICD-10-CM | POA: Diagnosis not present

## 2018-03-02 DIAGNOSIS — R0902 Hypoxemia: Secondary | ICD-10-CM | POA: Diagnosis not present

## 2018-03-02 DIAGNOSIS — Z811 Family history of alcohol abuse and dependence: Secondary | ICD-10-CM | POA: Diagnosis not present

## 2018-03-02 DIAGNOSIS — I69351 Hemiplegia and hemiparesis following cerebral infarction affecting right dominant side: Secondary | ICD-10-CM | POA: Diagnosis not present

## 2018-03-02 DIAGNOSIS — R001 Bradycardia, unspecified: Secondary | ICD-10-CM | POA: Diagnosis not present

## 2018-03-02 DIAGNOSIS — Z87891 Personal history of nicotine dependence: Secondary | ICD-10-CM | POA: Diagnosis not present

## 2018-03-02 DIAGNOSIS — R0789 Other chest pain: Secondary | ICD-10-CM | POA: Diagnosis not present

## 2018-03-02 DIAGNOSIS — Z950 Presence of cardiac pacemaker: Secondary | ICD-10-CM | POA: Diagnosis not present

## 2018-03-02 DIAGNOSIS — R2689 Other abnormalities of gait and mobility: Secondary | ICD-10-CM | POA: Diagnosis not present

## 2018-03-02 HISTORY — PX: PACEMAKER IMPLANT: EP1218

## 2018-03-02 LAB — BASIC METABOLIC PANEL
Anion gap: 10 (ref 5–15)
BUN: 21 mg/dL (ref 8–23)
CO2: 21 mmol/L — ABNORMAL LOW (ref 22–32)
Calcium: 8.7 mg/dL — ABNORMAL LOW (ref 8.9–10.3)
Chloride: 107 mmol/L (ref 98–111)
Creatinine, Ser: 0.96 mg/dL (ref 0.44–1.00)
GFR calc Af Amer: >60 mL/min (ref >60)
GFR calc non Af Amer: 55 mL/min — ABNORMAL LOW (ref >60)
Glucose, Bld: 127 mg/dL — ABNORMAL HIGH (ref 70–99)
Potassium: 4.8 mmol/L (ref 3.5–5.1)
Sodium: 138 mmol/L (ref 135–145)

## 2018-03-02 LAB — CBC
HCT: 24.3 % — ABNORMAL LOW (ref 36.0–46.0)
Hemoglobin: 7.5 g/dL — ABNORMAL LOW (ref 12.0–15.0)
MCH: 27.1 pg (ref 26.0–34.0)
MCHC: 30.9 g/dL (ref 30.0–36.0)
MCV: 87.7 fL (ref 80.0–100.0)
Platelets: 265 10*3/uL (ref 150–400)
RBC: 2.77 MIL/uL — ABNORMAL LOW (ref 3.87–5.11)
RDW: 15.5 % (ref 11.5–15.5)
WBC: 17.3 10*3/uL — ABNORMAL HIGH (ref 4.0–10.5)
nRBC: 0 % (ref 0.0–0.2)

## 2018-03-02 LAB — SURGICAL PCR SCREEN
MRSA, PCR: NEGATIVE
Staphylococcus aureus: NEGATIVE

## 2018-03-02 LAB — HEMOGLOBIN AND HEMATOCRIT, BLOOD
HCT: 24.3 % — ABNORMAL LOW (ref 36.0–46.0)
Hemoglobin: 7.4 g/dL — ABNORMAL LOW (ref 12.0–15.0)

## 2018-03-02 LAB — PREPARE RBC (CROSSMATCH)

## 2018-03-02 SURGERY — PACEMAKER IMPLANT

## 2018-03-02 MED ORDER — SODIUM CHLORIDE 0.9 % IV SOLN
INTRAVENOUS | Status: AC
  Filled 2018-03-02: qty 2

## 2018-03-02 MED ORDER — ACETAMINOPHEN 325 MG PO TABS: 325.0000 mg | ORAL_TABLET | ORAL | Status: DC | PRN

## 2018-03-02 MED ORDER — IOPAMIDOL (ISOVUE-370) INJECTION 76%
INTRAVENOUS | Status: AC
  Filled 2018-03-02: qty 50

## 2018-03-02 MED ORDER — HEPARIN (PORCINE) IN NACL 1000-0.9 UT/500ML-% IV SOLN
INTRAVENOUS | Status: DC | PRN
  Administered 2018-03-02: 500 mL

## 2018-03-02 MED ORDER — SODIUM CHLORIDE 0.9% FLUSH: 3.0000 mL | INTRAVENOUS | Status: DC | PRN

## 2018-03-02 MED ORDER — ONDANSETRON HCL 4 MG/2ML IJ SOLN: 4.0000 mg | Freq: Four times a day (QID) | INTRAMUSCULAR | Status: DC | PRN

## 2018-03-02 MED ORDER — CHLORHEXIDINE GLUCONATE 4 % EX LIQD
60.0000 mL | Freq: Once | CUTANEOUS | Status: AC
  Administered 2018-03-02: 4 via TOPICAL

## 2018-03-02 MED ORDER — LIDOCAINE HCL 1 % IJ SOLN
INTRAMUSCULAR | Status: AC
  Filled 2018-03-02: qty 60

## 2018-03-02 MED ORDER — FUROSEMIDE 10 MG/ML IJ SOLN: 20.0000 mg | Freq: Once | INTRAMUSCULAR | Status: DC

## 2018-03-02 MED ORDER — CEFAZOLIN SODIUM-DEXTROSE 1-4 GM/50ML-% IV SOLN
1.0000 g | Freq: Four times a day (QID) | INTRAVENOUS | Status: AC
  Administered 2018-03-02 – 2018-03-03 (×3): 1 g via INTRAVENOUS
  Filled 2018-03-02 (×4): qty 50

## 2018-03-02 MED ORDER — IOPAMIDOL (ISOVUE-370) INJECTION 76%
INTRAVENOUS | Status: DC | PRN
  Administered 2018-03-02: 15 mL via INTRAVENOUS

## 2018-03-02 MED ORDER — CEFAZOLIN SODIUM-DEXTROSE 2-4 GM/100ML-% IV SOLN
2.0000 g | INTRAVENOUS | Status: AC
  Administered 2018-03-02: 2 g via INTRAVENOUS
  Filled 2018-03-02: qty 100

## 2018-03-02 MED ORDER — SODIUM CHLORIDE 0.9% FLUSH
3.0000 mL | Freq: Two times a day (BID) | INTRAVENOUS | Status: DC
  Administered 2018-03-02: 3 mL via INTRAVENOUS

## 2018-03-02 MED ORDER — LIDOCAINE HCL (PF) 1 % IJ SOLN
INTRAMUSCULAR | Status: DC | PRN
  Administered 2018-03-02: 60 mL

## 2018-03-02 MED ORDER — CEFAZOLIN SODIUM-DEXTROSE 2-4 GM/100ML-% IV SOLN
INTRAVENOUS | Status: AC
  Filled 2018-03-02: qty 100

## 2018-03-02 MED ORDER — HEPARIN (PORCINE) IN NACL 1000-0.9 UT/500ML-% IV SOLN
INTRAVENOUS | Status: AC
  Filled 2018-03-02: qty 500

## 2018-03-02 MED ORDER — SODIUM CHLORIDE 0.9 % IV SOLN: 250.0000 mL | INTRAVENOUS | Status: DC

## 2018-03-02 MED ORDER — SODIUM CHLORIDE 0.9 % IV SOLN
80.0000 mg | INTRAVENOUS | Status: AC
  Administered 2018-03-02: 80 mg
  Filled 2018-03-02: qty 2

## 2018-03-02 MED ORDER — SODIUM CHLORIDE 0.9 % IV SOLN
INTRAVENOUS | Status: DC
  Administered 2018-03-02: 50 mL/h via INTRAVENOUS

## 2018-03-02 MED ORDER — CHLORHEXIDINE GLUCONATE 4 % EX LIQD
60.0000 mL | Freq: Once | CUTANEOUS | Status: AC
  Administered 2018-03-02: 4 via TOPICAL
  Filled 2018-03-02: qty 15

## 2018-03-02 MED ORDER — SODIUM CHLORIDE 0.9% IV SOLUTION: Freq: Once | INTRAVENOUS | Status: DC

## 2018-03-02 SURGICAL SUPPLY — 12 items
CABLE SURGICAL S-101-97-12 (CABLE) ×2 IMPLANT
CATH RIGHTSITE C315HIS02 (CATHETERS) ×2 IMPLANT
IPG PACE AZUR XT DR MRI W1DR01 (Pacemaker) ×1 IMPLANT
LEAD CAPSURE NOVUS 5076-52CM (Lead) ×2 IMPLANT
LEAD SELECT SECURE 3830 383069 (Lead) ×1 IMPLANT
PACE AZURE XT DR MRI W1DR01 (Pacemaker) ×2 IMPLANT
PAD PRO RADIOLUCENT 2001M-C (PAD) ×2 IMPLANT
SELECT SECURE 3830 383069 (Lead) ×2 IMPLANT
SHEATH CLASSIC 7F (SHEATH) ×4 IMPLANT
SLITTER 6232ADJ (MISCELLANEOUS) ×2 IMPLANT
TRAY PACEMAKER INSERTION (PACKS) ×2 IMPLANT
WIRE HI TORQ VERSACORE-J 145CM (WIRE) ×4 IMPLANT

## 2018-03-02 NOTE — Progress Notes (Addendum)
[place] VALVE TEAM  [name] Date of Encounter: 03/02/2018  Primary Cardiologist: Dr. Geraldo Pitter (wants to switch to Dr. Radford Pax) / Dr. Burt Knack & Dr. Roxy Manns (TAVR)  Hospital Problem List     Principal Problem:   S/P TAVR (transcatheter aortic valve replacement) Active Problems:   Hypertension   Hyperlipidemia   Hypothyroidism   Severe aortic stenosis   History of stroke   Unstable gait   Recurrent falls   RBBB   Chronic diastolic CHF (congestive heart failure) (HCC)   Sinus bradycardia   Cardiac arrest ([place])     Subjective   Feeling okay. Has a lot of chest pain from compressions yesterday. No other complaints. Thankful to the providers to helped her yesterday   Inpatient Medications    Scheduled Meds: . sodium chloride   Intravenous Once  . aspirin  81 mg Oral Daily  . atorvastatin  40 mg Oral q1800  . clopidogrel  75 mg Oral Q breakfast  . furosemide  20 mg Oral Daily  . gentamicin irrigation  80 mg Irrigation On Call  . levothyroxine  100 mcg Oral QAC breakfast  . sodium chloride flush  3 mL Intravenous Q12H  . sodium chloride flush  3 mL Intravenous Q12H   Continuous Infusions: . sodium chloride    . sodium chloride 10 mL/hr at 03/02/18 0500  . sodium chloride 50 mL/hr (03/02/18 0620)  . sodium chloride    .  ceFAZolin (ANCEF) IV    . cefUROXime (ZINACEF)  IV Stopped (03/02/18 0103)  . nitroGLYCERIN    . norepinephrine (LEVOPHED) Adult infusion 1 mcg/min (03/02/18 0500)  . phenylephrine (NEO-SYNEPHRINE) Adult infusion 20 mcg/min (03/01/18 0320)   PRN Meds: sodium chloride, sodium chloride, acetaminophen **OR** acetaminophen, morphine injection, ondansetron (ZOFRAN) IV, oxyCODONE, sodium chloride flush, sodium chloride flush, traMADol   Vital Signs    Vitals:   03/02/18 0615 03/02/18 0630 03/02/18 0645 03/02/18 0819  BP: 125/64 (!) 148/46 (!) 134/51   Pulse: 79 84 (!) 58   Resp: 19 (!) 22 18     Temp:    98.7 F (37.1 C)  TempSrc:    Oral  SpO2: 100% 100% 100%   Weight:      Height:        Intake/Output Summary (Last 24 hours) at 03/02/2018 0855 Last data filed at 03/02/2018 0600 Gross per 24 hour  Intake 1156.07 ml  Output 1000 ml  Net 156.07 ml   Filed Weights   02/28/18 1035 03/01/18 1500 03/02/18 0500  Weight: 83.9 kg 89.2 kg 88.2 kg    Physical Exam   GEN: Well nourished, well developed, in no acute distress.  HEENT: Grossly normal.  Neck: Supple, no JVD, carotid bruits, or masses. Cardiac: RRR, 2/6 SEM. No rubs, or gallops. No clubbing, cyanosis, edema.  Radials/DP/PT 2+ and equal bilaterally.  Respiratory:  Respirations regular and unlabored, clear to auscultation bilaterally. GI: Soft, nontender, nondistended, BS + x 4. MS: no deformity or atrophy. Skin: warm and dry, no rash. Temp pacer in place.  Neuro:  Strength and sensation are intact. Psych: AAOx3.  Normal affect.  Labs    CBC Recent Labs    03/01/18 0228 03/02/18 0241  WBC 10.9* 17.3*  HGB 7.1* 7.5*  HCT 23.4* 24.3*  MCV 90.7 87.7  PLT 259 585   Basic Metabolic Panel Recent Labs    03/01/18 0228 03/01/18 0806 03/02/18 0241  NA 133*  --  138  K 5.9* 3.9 4.8  CL 107  --  107  CO2 22  --  21*  GLUCOSE 187*  --  127*  BUN 25*  --  21  CREATININE 0.91  --  0.96  CALCIUM 8.0*  --  8.7*  MG 2.1  --   --    Liver Function Tests Recent Labs    02/27/18 1505  AST 18  ALT 12  ALKPHOS 68  BILITOT 0.6  PROT 6.9  ALBUMIN 3.7   No results for input(s): LIPASE, AMYLASE in the last 72 hours. Cardiac Enzymes No results for input(s): CKTOTAL, CKMB, CKMBINDEX, TROPONINI in the last 72 hours. BNP Invalid input(s): POCBNP D-Dimer No results for input(s): DDIMER in the last 72 hours. Hemoglobin A1C Recent Labs    02/27/18 1506  HGBA1C 5.5   Fasting Lipid Panel No results for input(s): CHOL, HDL, LDLCALC, TRIG, CHOLHDL, LDLDIRECT in the last 72 hours. Thyroid Function Tests No  results for input(s): TSH, T4TOTAL, T3FREE, THYROIDAB in the last 72 hours.  Invalid input(s): FREET3  Telemetry    pacing - Personally Reviewed  ECG    Bradycardia with 3rd degree heart block with RBBB - Personally Reviewed  Radiology    Dg Chest Port 1 View  Result Date: 02/28/2018 CLINICAL DATA:  Status post transcatheter aortic valve replacement. EXAM: PORTABLE CHEST 1 VIEW COMPARISON:  Radiographs of February 20, 2018. FINDINGS: Stable cardiomegaly. Atherosclerosis of thoracic aorta is noted. Aortic valve prosthesis is noted. Interval placement of right internal jugular catheter with tip in expected position of the SVC. No pneumothorax or pleural effusion is noted. Mild Kerley B lines are noted in both lungs suggesting possible minimal pulmonary edema. Bony thorax is unremarkable. IMPRESSION: Mild Kerley B lines are noted in both lungs suggesting possible minimal pulmonary edema. No pneumothorax or pleural effusion is noted. Aortic Atherosclerosis (ICD10-I70.0). Electronically Signed   By: Marijo Conception, M.D.   On: 02/28/2018 18:50    Cardiac Studies   TAVR OPERATIVE NOTE   Date of Procedure:02/28/2018  Preoperative Diagnosis:Severe Aortic Stenosis   Postoperative Diagnosis:Same   Procedure:   Transcatheter Aortic Valve Replacement - PercutaneousRightTransfemoral Approach Edwards Sapien 3 THV (size 48mm, model # 9600TFX, serial # [id number])  Co-Surgeons:Clarence H. Roxy Manns, MD and Sherren Mocha, MD  Anesthesiologist:William Therisa Doyne, MD  Echocardiographer:Peter Johnsie Cancel, MD  Pre-operative Echo Findings: ? Severe aortic stenosis ? Mild aortic insufficiency ? Moderate mitral stenosis ? Normalleft ventricular systolic function  Post-operative Echo Findings: ? Noparavalvular leak ? Normalleft ventricular systolic  function   __________________   Echo 03/01/18: IMPRESSIONS  1. The left ventricle has normal systolic function, with an ejection fraction of 60-65%. The cavity size was normal. There is moderately increased left ventricular wall thickness.  2. Left atrial size was severely dilated.  3. Mildly thickened tricuspid valve leaflets.  4. The mitral valve is rheumatic. Severe thickening of the mitral valve leaflet. Severe calcification of the mitral valve leaflet. There is severe mitral annular calcification present. Moderate mitral valve stenosis.  5. The tricuspid valve was normal in structure. Tricuspid valve regurgitation is moderate.  6. Post TAVR with 23 mm Sapien 3 valve Not well visualized no PVL gradients have increased considerably since implant 02/28/18.  Patient Profile     Julie Mcgee a 85 y.o.femalewith a hx of HTN, HLD, RBBB, previousCVA with residual R hemiparesis,carotid artery disease, moderate mitral stenosis, chronic diastolic CHF with recent admission and severe ASwho [facility] planned [facility].  Assessment & Plan    Severe AS:s/p successful TAVR with a35mm Edwards Sapien 3 THV via the TF approach on 02/28/18. Post operative echoshowed EF 60%,mod MS, no PVL, increased gradients with mean gradient of 22 mm Hg. Groin sites are stable, temp wire in place. Continue Asprin andPlavix.  Complete heart block: pt had baseline RBBB. She had transient intra procedural CHB that resolved. Her temp wire was maintained over night and pulled the following morning. Later yesterday afternoon she had had CHB w/ ventricular standtill, prolonged pause/bradycardia treated with epi, CPR >> dopamine >> temp pacing wire. EP has seen and she will get a PPM today  Chest pain: having significant chest pain from compressions. Will get a CXR to assess for rib fracture   Acute on chronic anemia: Hg 8.5--> 7.1 She was transfused 1 UPRBCs yesterday. Hg up to 7.5. I will  order one more unit of blood.   Acute on chronic diastolic CHF: as evidenced by admission last week for acute CHF, persistently elevated BNP~1000on pre admission lab work andmildpulmonary vascular congestion on CXR. This has been treated with TAVR. Home lasix has been resumed.  HTN: BP in normal range.   Mable Fill, PA-C  03/02/2018, 8:55 AM  Pager [phone]  Patient seen, examined. Available data reviewed. Agree with findings, assessment, and plan as outlined by Nell Range, PA-C.  On my evaluation, the patient is alert, oriented, in no distress.  She is an elderly woman.  JVP is normal, lung fields are clear.  Heart is regular rate and rhythm with a 2/6 systolic murmur at the right upper sternal border.  There is no diastolic murmur.  Bilateral groin sites are clear.  There is a temporary pacing wire through the femoral venous sheath in place with no evidence of hematoma.  There is no pretibial edema.  On telemetry the patient is ventricular paced at 60 bpm.  I agree with the plans as outlined above for a 1 unit packed red blood cell transfusion today.  The patient was anemic at baseline and has had a modest response to 1 unit of packed red blood cells yesterday.  She does not appear to be actively bleeding.  Her groin sites are clear with no evidence of hematoma.  Plans noted for permanent pacemaker implantation today.  Sherren Mocha, M.D. 03/02/2018 12:37 PM

## 2018-03-03 ENCOUNTER — Encounter (HOSPITAL_COMMUNITY): Payer: Self-pay | Admitting: Internal Medicine

## 2018-03-03 ENCOUNTER — Inpatient Hospital Stay (HOSPITAL_COMMUNITY): Payer: Medicare Other

## 2018-03-03 ENCOUNTER — Other Ambulatory Visit: Payer: Self-pay | Admitting: Physician Assistant

## 2018-03-03 DIAGNOSIS — Z952 Presence of prosthetic heart valve: Secondary | ICD-10-CM

## 2018-03-03 DIAGNOSIS — I5033 Acute on chronic diastolic (congestive) heart failure: Secondary | ICD-10-CM

## 2018-03-03 LAB — CBC
HCT: 26.5 % — ABNORMAL LOW (ref 36.0–46.0)
Hemoglobin: 8.1 g/dL — ABNORMAL LOW (ref 12.0–15.0)
MCH: 26.8 pg (ref 26.0–34.0)
MCHC: 30.6 g/dL (ref 30.0–36.0)
MCV: 87.7 fL (ref 80.0–100.0)
Platelets: 189 10*3/uL (ref 150–400)
RBC: 3.02 MIL/uL — ABNORMAL LOW (ref 3.87–5.11)
RDW: 15.4 % (ref 11.5–15.5)
WBC: 13.5 10*3/uL — ABNORMAL HIGH (ref 4.0–10.5)
nRBC: 0 % (ref 0.0–0.2)

## 2018-03-03 LAB — BPAM RBC
Blood Product Expiration Date: 202003102359
Blood Product Expiration Date: 202003142359
ISSUE DATE / TIME: 202002191001
ISSUE DATE / TIME: 202002200941
Unit Type and Rh: 6200
Unit Type and Rh: 6200

## 2018-03-03 LAB — BASIC METABOLIC PANEL
Anion gap: 7 (ref 5–15)
BUN: 15 mg/dL (ref 8–23)
CO2: 23 mmol/L (ref 22–32)
Calcium: 8.4 mg/dL — ABNORMAL LOW (ref 8.9–10.3)
Chloride: 105 mmol/L (ref 98–111)
Creatinine, Ser: 0.84 mg/dL (ref 0.44–1.00)
GFR calc Af Amer: >60 mL/min (ref >60)
GFR calc non Af Amer: >60 mL/min (ref >60)
Glucose, Bld: 105 mg/dL — ABNORMAL HIGH (ref 70–99)
Potassium: 4.2 mmol/L (ref 3.5–5.1)
Sodium: 135 mmol/L (ref 135–145)

## 2018-03-03 LAB — TYPE AND SCREEN
ABO/RH(D): A POS
Antibody Screen: NEGATIVE
Unit division: 0
Unit division: 0

## 2018-03-03 MED ORDER — FUROSEMIDE 20 MG PO TABS
20.0000 mg | ORAL_TABLET | Freq: Every day | ORAL | Status: DC
  Administered 2018-03-04 – 2018-03-05 (×2): 20 mg via ORAL
  Filled 2018-03-03 (×2): qty 1

## 2018-03-03 MED ORDER — FERROUS SULFATE 325 (65 FE) MG PO TABS
325.0000 mg | ORAL_TABLET | Freq: Every day | ORAL | Status: DC
  Administered 2018-03-04 – 2018-03-06 (×3): 325 mg via ORAL
  Filled 2018-03-03 (×3): qty 1

## 2018-03-03 MED ORDER — FUROSEMIDE 10 MG/ML IJ SOLN
20.0000 mg | Freq: Once | INTRAMUSCULAR | Status: AC
  Administered 2018-03-03: 20 mg via INTRAVENOUS
  Filled 2018-03-03: qty 2

## 2018-03-03 MED FILL — Lidocaine HCl Local Inj 1%: INTRAMUSCULAR | Qty: 60 | Status: AC

## 2018-03-03 NOTE — Progress Notes (Signed)
  [facility]   MULTIDISCIPLINARY HEART VALVE TEAM   Talked to patient's son over the phone. He is very concerned with the patient's level of weakness. He is concerned about being able to take care of her at home. Would like to look into SNF options. She currently lives at [facility] and would like to go there. I have placed a PT and SW consult. If a bed is available over the weekend she is cleared for DC. Otherwise I will discharge her on Monday.   Angelena Form PA-C  [facility]

## 2018-03-03 NOTE — Progress Notes (Addendum)
[place] VALVE TEAM  [name] Date of Encounter: 03/03/2018  Primary Cardiologist: Dr. Geraldo Pitter (wants to switch to Dr. Radford Pax) / Dr. Burt Knack & Dr. Roxy Manns (TAVR)  Hospital Problem List     Principal Problem:   S/P TAVR (transcatheter aortic valve replacement) Active Problems:   Hypertension   Hyperlipidemia   Hypothyroidism   Severe aortic stenosis   History of stroke   Unstable gait   Recurrent falls   RBBB   Acute on chronic diastolic heart failure (HCC)   Sinus bradycardia   Cardiac arrest ([place])   Complete heart block ([place])     Subjective   Feeling okay. No complaints. Very weak. She hasn't been out of bed at all.   Inpatient Medications    Scheduled Meds: . sodium chloride   Intravenous Once  . sodium chloride   Intravenous Once  . aspirin  81 mg Oral Daily  . atorvastatin  40 mg Oral q1800  . clopidogrel  75 mg Oral Q breakfast  . [START ON 03/04/2018] ferrous sulfate  325 mg Oral Q breakfast  . furosemide  20 mg Intravenous Once  . [START ON 03/04/2018] furosemide  20 mg Oral Daily  . levothyroxine  100 mcg Oral QAC breakfast  . sodium chloride flush  3 mL Intravenous Q12H   Continuous Infusions: . sodium chloride    . sodium chloride Stopped (03/03/18 0554)  . nitroGLYCERIN    . norepinephrine (LEVOPHED) Adult infusion 40 mcg/min (03/02/18 1018)  . phenylephrine (NEO-SYNEPHRINE) Adult infusion 20 mcg/min (03/01/18 0320)   PRN Meds: sodium chloride, sodium chloride, acetaminophen, morphine injection, ondansetron (ZOFRAN) IV, oxyCODONE, sodium chloride flush, traMADol   Vital Signs    Vitals:   03/03/18 0600 03/03/18 0700 03/03/18 0742 03/03/18 0800  BP: (!) 136/56 (!) 132/50  (!) 133/48  Pulse: 87 86 90 90  Resp: (!) 22 (!) 27 18 (!) 24  Temp:      TempSrc:      SpO2: 95% 95% 96% 97%  Weight:      Height:        Intake/Output Summary (Last 24 hours) at 03/03/2018 0945 Last data filed  at 03/03/2018 0800 Gross per 24 hour  Intake 1614.72 ml  Output 550 ml  Net 1064.72 ml   Filed Weights   03/01/18 1500 03/02/18 0500 03/03/18 0500  Weight: 89.2 kg 88.2 kg 89.1 kg    Physical Exam   GEN: Well nourished, well developed, in no acute distress. obese HEENT: Grossly normal.  Neck: Supple, no JVD, carotid bruits, or masses. Cardiac: RRR, 2/6 SEM. No rubs, or gallops. No clubbing, cyanosis, trace pretibial edema bilaterally.  Radials/DP/PT 2+ and equal bilaterally.  Respiratory:  Respirations regular and unlabored, clear to auscultation bilaterally. GI: Soft, nontender, nondistended, BS + x 4. MS: no deformity or atrophy. Skin: warm and dry, no rash. Groin sites are stable  Neuro:  Strength and sensation are intact. Psych: AAOx3.  Normal affect.  Labs    CBC Recent Labs    03/02/18 0241 03/02/18 0937 03/03/18 0310  WBC 17.3*  --  13.5*  HGB 7.5* 7.4* 8.1*  HCT 24.3* 24.3* 26.5*  MCV 87.7  --  87.7  PLT 265  --  790   Basic Metabolic Panel Recent Labs    03/01/18 0228  03/02/18 0241 03/03/18 0310  NA 133*  --  138 135  K 5.9*   < > 4.8 4.2  CL 107  --  107 105  CO2 22  --  21* 23  GLUCOSE 187*  --  127* 105*  BUN 25*  --  21 15  CREATININE 0.91  --  0.96 0.84  CALCIUM 8.0*  --  8.7* 8.4*  MG 2.1  --   --   --    < > = values in this interval not displayed.   Liver Function Tests No results for input(s): AST, ALT, ALKPHOS, BILITOT, PROT, ALBUMIN in the last 72 hours. No results for input(s): LIPASE, AMYLASE in the last 72 hours. Cardiac Enzymes No results for input(s): CKTOTAL, CKMB, CKMBINDEX, TROPONINI in the last 72 hours. BNP Invalid input(s): POCBNP D-Dimer No results for input(s): DDIMER in the last 72 hours. Hemoglobin A1C No results for input(s): HGBA1C in the last 72 hours. Fasting Lipid Panel No results for input(s): CHOL, HDL, LDLCALC, TRIG, CHOLHDL, LDLDIRECT in the last 72 hours. Thyroid Function Tests No results for input(s):  TSH, T4TOTAL, T3FREE, THYROIDAB in the last 72 hours.  Invalid input(s): FREET3  Telemetry    paced- Personally Reviewed  ECG    AV paced - Personally Reviewed  Radiology    Dg Chest 2 View  Result Date: 03/03/2018 CLINICAL DATA:  Sore arm.  Status post pacemaker placement. EXAM: CHEST - 2 VIEW COMPARISON:  03/02/2018. FINDINGS: Cardiac pacer with lead tip over the right atrium right ventricle. Prior aortic valve replacement. Stable cardiomegaly. Mild bilateral interstitial prominence and tiny bilateral pleural effusions. Mild CHF can not be excluded. No pneumothorax. Degenerative changes scoliosis thoracic spine. Carotid vascular calcification. IMPRESSION: 1. Cardiac pacer noted with lead tip over the right atrium right ventricle. No evidence of pneumothorax. 2. Prior cardiac valve replacement. Cardiomegaly with mild bilateral interstitial prominence and tiny bilateral pleural effusions suggesting mild CHF. 3.  Carotid vascular disease. Electronically Signed   By: Marcello Moores  Register   On: 03/03/2018 07:08   Dg Chest Port 1 View  Result Date: 03/02/2018 CLINICAL DATA:  Chest discomfort.  CPR yesterday EXAM: PORTABLE CHEST 1 VIEW COMPARISON:  Two days ago FINDINGS: Cardiomegaly. Transcatheter aortic valve replacement and extensive/caseous ossification of the mitral valve annulus. There is no edema, consolidation, effusion, or pneumothorax. No appreciable rib fracture. Temporary pacer from below with lead over the right ventricle. Artifact from defibrillator pad over the left chest. IMPRESSION: No interval or traumatic finding. Stable temporary pacer positioning. Electronically Signed   By: Monte Fantasia M.D.   On: 03/02/2018 09:38    Cardiac Studies   TAVR OPERATIVE NOTE   Date of Procedure:02/28/2018  Preoperative Diagnosis:Severe Aortic Stenosis   Postoperative Diagnosis:Same   Procedure:   Transcatheter Aortic Valve Replacement -  PercutaneousRightTransfemoral Approach Edwards Sapien 3 THV (size 19mm, model # 9600TFX, serial # [id number])  Co-Surgeons:Clarence H. Roxy Manns, MD and Sherren Mocha, MD  Anesthesiologist:William Therisa Doyne, MD  Echocardiographer:Peter Johnsie Cancel, MD  Pre-operative Echo Findings: ? Severe aortic stenosis ? Mild aortic insufficiency ? Moderate mitral stenosis ? Normalleft ventricular systolic function  Post-operative Echo Findings: ? Noparavalvular leak ? Normalleft ventricular systolic function   __________________   Echo 03/01/18: IMPRESSIONS 1. The left ventricle has normal systolic function, with an ejection fraction of 60-65%. The cavity size was normal. There is moderately increased left ventricular wall thickness. 2. Left atrial size was severely dilated. 3. Mildly thickened tricuspid valve leaflets. 4. The mitral valve is rheumatic. Severe thickening of the mitral valve leaflet. Severe calcification of the mitral valve leaflet. There is severe mitral annular calcification present. Moderate mitral valve stenosis.  5. The tricuspid valve was normal in structure. Tricuspid valve regurgitation is moderate. 6. Post TAVR with 23 mm Sapien 3 valve Not well visualized no PVL gradients have increased considerably since implant 02/28/18.   __________________  03/02/18 PACEMAKER IMPLANT   CONCLUSIONS:   1. Successful implantation of a Medtronic dual-chamber pacemaker for symptomatic bradycardia due to complete heart block  2. No early apparent complications.         Patient Profile     Julie Mcgee a 85 y.o.femalewith a hx of HTN, HLD, RBBB, previousCVAwith residual R hemiparesis,carotid artery disease, moderate mitral stenosis, chronic diastolic CHF with recent admission andsevere ASwho [facility] planned [facility].   Assessment & Plan    Severe AS:s/p successful  TAVR with a10mm Edwards Sapien 3 THV via the TF approach on 02/28/18. Post operative echoshowed EF 60%,mod MS, no PVL, increased gradients with mean gradient of 22 mm Hg. Continue Asprin andPlavix.  Complete heart block: pt had baseline RBBB. She had transient intra procedural CHB that resolved. Her temp wire was maintained over night and pulled the following morning. On 2/19 she had had CHB w/ ventricular standtill, prolonged pause/bradycardia treated wit epi, CPR >> dopamine >> temp pacing wire. She underwent successful Medtronic dual-chamber PPM by Dr. Lovena Le on 2/20.   Chest pain: had significant chest pain from compressions. CXR did not show any trauma  Acute on chronic anemia:Hg down to 7.1. Now 8.1 after 2U PRBCs. Will start iron supplementation   Acute on chronic diastolic CHF: as evidenced byadmission last week for acute CHF, persistentlyelevated BNP~1000on pre admission lab work andmildpulmonary vascular congestion on CXR. This has been treated with TAVR. She has had a lot of volume replacement. CXR shows mild CHF today and has some mild pretibial edema. Will give small dose of IV lasix 20mg . She will resume home lasix 20mg  daily tomorrow.   HTN: BP in normal range.  Dispo: patient very weak from prolonged bedrest. I have ordered a PT consult to come work with her today. I discussed disposition with son. They have family in town to help her around the clock at home for the next week at least. She also has home health PT that already comes to her house. Plan to transfer her to 4E today with hopeful discharge over the weekend. All follow up appointments and DC instructions placed in chart.   SignedAngelena Form, PA-C  03/03/2018, 9:45 AM  Pager [phone]  Patient seen, examined. Available data reviewed. Agree with findings, assessment, and plan as outlined by Nell Range, PA-C.  On my exam the patient is alert, oriented, in no distress.  JVP is mildly elevated, lung  fields are clear, heart is regular rate and rhythm with a 2/6 systolic ejection murmur at the right upper sternal border.  There is no diastolic murmur.  Abdomen is soft and nontender.  Bilateral groin sites are clear with no ecchymosis or hematoma.  There is trace pretibial edema bilaterally.  The patient has stabilized after permanent pacemaker placement.  Telemetry is reviewed and demonstrates an atrial sensed, ventricular paced rhythm averaging about 80 bpm.  Agree with PT consultation.  I would anticipate that she will be ready for discharge home tomorrow after being mobilized.  She will likely need home health services.  Her hemoglobin has improved with 2 units of packed red blood cells.  We will start iron supplementation.  No evidence of active bleeding.  Hospital follow-up will be arranged in case of discharge  over the weekend.  Sherren Mocha, M.D. 03/03/2018 10:06 AM

## 2018-03-03 NOTE — Code Documentation (Signed)
CODE BLUE NOTE  [name]   MRN: [id number]   Date of Birth/ Sex: 1933/04/16 , female      Admission Date: 02/28/2018  Attending Provider: Sherren Mocha, MD  Primary Diagnosis: S/P TAVR (transcatheter aortic valve replacement)   Indication: Pt was in her usual state of health until this PM, when she was noted to be altered and became asystolic. Code blue was subsequently called. At the time of arrival on scene, ACLS protocol was underway.  Technical Description:  - CPR performance duration:  2 minutes  - Was defibrillation or cardioversion used? No   - Was external pacer placed? Yes  - Was patient intubated pre/post CPR? No   Medications Administered: Y = Yes; Blank = No Amiodarone    Atropine    Calcium    Epinephrine  1  Lidocaine    Magnesium    Norepinephrine    Phenylephrine    Sodium bicarbonate    Vasopressin    Dopamine Drip @ 12mcg > 9mcg  Post CPR evaluation:  - Final Status - Was patient successfully resuscitated ? Yes - What is current rhythm? Sinus brady - What is current hemodynamic status? Stable  Miscellaneous Information:  - Labs sent, including: None  - Primary team notified?  Yes  - Family Notified? Yes, were present  - Additional notes/ transfer status: None   Daisy Floro, DO  03/03/2018, 2:08 PM

## 2018-03-03 NOTE — Progress Notes (Signed)
Pt arrived to 4E from [facility] 2H. Pt oriented to room and staff. Telemetry box applied and CCMD notified x2. Vitals obtained and stable. Purewick in place and suction applied. Pt denies needs at this time. Will continue current plan of care.   Ara Kussmaul BSN, RN

## 2018-03-03 NOTE — Progress Notes (Addendum)
Progress Note  [name] Date of Encounter: 03/03/2018  Primary Cardiologist: Jenean Lindau, MD   Subjective   No CP or SOB, OOB, generally weak, no specific complaints, denies pain at implant site  Inpatient Medications    Scheduled Meds: . sodium chloride   Intravenous Once  . sodium chloride   Intravenous Once  . aspirin  81 mg Oral Daily  . atorvastatin  40 mg Oral q1800  . clopidogrel  75 mg Oral Q breakfast  . [START ON 03/04/2018] ferrous sulfate  325 mg Oral Q breakfast  . [START ON 03/04/2018] furosemide  20 mg Oral Daily  . levothyroxine  100 mcg Oral QAC breakfast  . sodium chloride flush  3 mL Intravenous Q12H   Continuous Infusions: . sodium chloride    . sodium chloride Stopped (03/03/18 0554)  . nitroGLYCERIN    . norepinephrine (LEVOPHED) Adult infusion 40 mcg/min (03/02/18 1018)  . phenylephrine (NEO-SYNEPHRINE) Adult infusion 20 mcg/min (03/01/18 0320)   PRN Meds: sodium chloride, sodium chloride, acetaminophen, morphine injection, ondansetron (ZOFRAN) IV, oxyCODONE, sodium chloride flush, traMADol   Vital Signs    Vitals:   03/03/18 0742 03/03/18 0800 03/03/18 0900 03/03/18 1000  BP:  (!) 133/48 (!) 132/55 (!) 131/52  Pulse: 90 90 89 87  Resp: 18 (!) 24 (!) 27 (!) 23  Temp:      TempSrc:      SpO2: 96% 97% 96% 97%  Weight:      Height:        Intake/Output Summary (Last 24 hours) at 03/03/2018 1027 Last data filed at 03/03/2018 0800 Gross per 24 hour  Intake 1551.48 ml  Output 550 ml  Net 1001.48 ml   Last 3 Weights 03/03/2018 03/02/2018 03/01/2018  Weight (lbs) 196 lb 6.9 oz 194 lb 7.1 oz 196 lb 10.4 oz  Weight (kg) 89.1 kg 88.2 kg 89.2 kg      Telemetry    SR/V paced - Personally Reviewed  ECG    SR/V paced - Personally Reviewed  Physical Exam   GEN: No acute distress.   Neck: No JVD Cardiac: RRR, no murmurs, rubs, or gallops.  Respiratory: slight diminished at the bases. GI: Soft, nontender, non-distended   MS: No edema; age appropriate atrophy Neuro:  Nonfocal  Psych: Normal affect   L chest/pacer site is stable, no hematoma or bleeding  Labs    Chemistry Recent Labs  Lab 02/27/18 1505  03/01/18 0228 03/01/18 0806 03/02/18 0241 03/03/18 0310  NA 137   < > 133*  --  138 135  K 4.3   < > 5.9* 3.9 4.8 4.2  CL 105  --  107  --  107 105  CO2 19*  --  22  --  21* 23  GLUCOSE 100*   < > 187*  --  127* 105*  BUN 28*  --  25*  --  21 15  CREATININE 0.91   < > 0.91  --  0.96 0.84  CALCIUM 9.3  --  8.0*  --  8.7* 8.4*  PROT 6.9  --   --   --   --   --   ALBUMIN 3.7  --   --   --   --   --   AST 18  --   --   --   --   --   ALT 12  --   --   --   --   --  ALKPHOS 68  --   --   --   --   --   BILITOT 0.6  --   --   --   --   --   GFRNONAA 59*  --  59*  --  55* >60  GFRAA >60  --  >60  --  >60 >60  ANIONGAP 13  --  4*  --  10 7   < > = values in this interval not displayed.     Hematology Recent Labs  Lab 03/01/18 0228 03/02/18 0241 03/02/18 0937 03/03/18 0310  WBC 10.9* 17.3*  --  13.5*  RBC 2.58* 2.77*  --  3.02*  HGB 7.1* 7.5* 7.4* 8.1*  HCT 23.4* 24.3* 24.3* 26.5*  MCV 90.7 87.7  --  87.7  MCH 27.5 27.1  --  26.8  MCHC 30.3 30.9  --  30.6  RDW 15.1 15.5  --  15.4  PLT 259 265  --  189    Cardiac EnzymesNo results for input(s): TROPONINI in the last 168 hours. No results for input(s): TROPIPOC in the last 168 hours.   BNP Recent Labs  Lab 02/27/18 1505  BNP 1,020.2*     DDimer No results for input(s): DDIMER in the last 168 hours.   Radiology    Dg Chest 2 View  Result Date: 03/03/2018 CLINICAL DATA:  Sore arm.  Status post pacemaker placement. EXAM: CHEST - 2 VIEW COMPARISON:  03/02/2018. FINDINGS: Cardiac pacer with lead tip over the right atrium right ventricle. Prior aortic valve replacement. Stable cardiomegaly. Mild bilateral interstitial prominence and tiny bilateral pleural effusions. Mild CHF can not be excluded. No pneumothorax. Degenerative  changes scoliosis thoracic spine. Carotid vascular calcification. IMPRESSION: 1. Cardiac pacer noted with lead tip over the right atrium right ventricle. No evidence of pneumothorax. 2. Prior cardiac valve replacement. Cardiomegaly with mild bilateral interstitial prominence and tiny bilateral pleural effusions suggesting mild CHF. 3.  Carotid vascular disease. Electronically Signed   By: Marcello Moores  Register   On: 03/03/2018 07:08   Dg Chest Port 1 View  Result Date: 03/02/2018 CLINICAL DATA:  Chest discomfort.  CPR yesterday EXAM: PORTABLE CHEST 1 VIEW COMPARISON:  Two days ago FINDINGS: Cardiomegaly. Transcatheter aortic valve replacement and extensive/caseous ossification of the mitral valve annulus. There is no edema, consolidation, effusion, or pneumothorax. No appreciable rib fracture. Temporary pacer from below with lead over the right ventricle. Artifact from defibrillator pad over the left chest. IMPRESSION: No interval or traumatic finding. Stable temporary pacer positioning. Electronically Signed   By: Monte Fantasia M.D.   On: 03/02/2018 09:38    Cardiac Studies   03/01/2018: TTE IMPRESSIONS 1. The left ventricle has normal systolic function, with an ejection fraction of 60-65%. The cavity size was normal. There is moderately increased left ventricular wall thickness. 2. Left atrial size was severely dilated. 3. Mildly thickened tricuspid valve leaflets. 4. The mitral valve is rheumatic. Severe thickening of the mitral valve leaflet. Severe calcification of the mitral valve leaflet. There is severe mitral annular calcification present. Moderate mitral valve stenosis. 5. The tricuspid valve was normal in structure. Tricuspid valve regurgitation is moderate. 6. Post TAVR with 23 mm Sapien 3 valve Not well visualized no PVL gradients have increased considerably since implant 02/28/18.  Patient Profile     85 y.o. female with a hx of RBBB, HTN, HLD, stroke (maintained on warfarin though  recently stopped 2/2 falls and unsteady gait, I do not see any h/o AF), VHF w/mod  MS, and severe AS admitted for TAVR, intra-post procedure had CHB   Assessment & Plan    1. CHB s/p TAVR     Baseline RBBB     Now s/p PPM yesterday with Dr. Lovena Le     Device check this AM with stable measurements     CXR with no ptx     Site is stable     Wound care and activity restrictions were discussed with the patient (and provided in her AVS)     Routine EP/post PPM follow up is in place  EP service will sign off though remain available Please recall if needed    For questions or updates, please contact [facility] Please consult [email or web address] for contact info under        Signed, Baldwin Jamaica, PA-C  03/03/2018, 10:27 AM    EP Attending  Patient seen and examined. Agree with the findings as noted above. The patient is doing well after DDD PM insertion. CXR looks good. Her PPM interogation under my supervision demonstrates normal DDD PM function. She will need to get up and advance her activity. No other EP recs at this time. Would like her to restart her systemic anti-coagulation 2 be therapeutic in a few days.  Mikle Bosworth.D.

## 2018-03-03 NOTE — Discharge Instructions (Signed)
Supplemental Discharge Instructions for  Pacemaker/Defibrillator Patients  Activity No heavy lifting or vigorous activity with your left/right arm for 6 to 8 weeks.  Do not raise your left/right arm above your head for one week.  Gradually raise your affected arm as drawn below.              03/06/2018                03/07/2018                03/08/2018              03/09/2018 __  NO DRIVING for  1 week   ; you may begin driving on 4/76/5465  .  WOUND CARE - Keep the wound area clean and dry.  Do not get this area wet for one week. No showers for one week; you may shower on  03/09/2018   . - The tape/steri-strips on your wound will fall off; do not pull them off.  No bandage is needed on the site.  DO  NOT apply any creams, oils, or ointments to the wound area. - If you notice any drainage or discharge from the wound, any swelling or bruising at the site, or you develop a fever > 101? F after you are discharged home, call the office at once.  Special Instructions - You are still able to use cellular telephones; use the ear opposite the side where you have your pacemaker/defibrillator.  Avoid carrying your cellular phone near your device. - When traveling through airports, show security personnel your identification card to avoid being screened in the metal detectors.  Ask the security personnel to use the hand wand. - Avoid arc welding equipment, MRI testing (magnetic resonance imaging), TENS units (transcutaneous nerve stimulators).  Call the office for questions about other devices. - Avoid electrical appliances that are in poor condition or are not properly grounded. - Microwave ovens are safe to be near or to operate.    ACTIVITY AND EXERCISE  Daily activity and exercise are an important part of your recovery. People recover at different rates depending on their general health and type of valve procedure.  Most people recovering from TAVR feel better relatively quickly   No  lifting, pushing, pulling more than 10 pounds (examples to avoid: groceries, vacuuming, gardening, golfing):             - For one week with a procedure through the groin.             - For six weeks for procedures through the chest wall or neck NOTE: You will typically see one of our providers 7-14 days after your procedure to discuss [place] the above activities.      DRIVING  Do not drive for until you are seen for follow up and cleared by a provider. Generally, we ask patient to not drive for 1 week after their procedure.  If you have been told by your doctor in the past that you may not drive, you must talk with him/her before you begin driving again.     DRESSING  Groin site: you may leave the clear dressing over the site for up to one week or until it falls off.     HYGIENE  If you had a femoral (leg) procedure, you may take a shower when you return home. After the shower, pat the site dry. Do NOT use powder, oils or lotions in  your groin area until the site has completely healed.  If you had a chest procedure, you may shower when you return home unless specifically instructed not to by your discharging practitioner.             - DO NOT scrub incision; pat dry with a towel             - DO NOT apply any lotions, oils, powders to the incision             - No tub baths / swimming for at least 2 weeks.  If you notice any fevers, chills, increased pain, swelling, bleeding or pus, please contact your doctor.   ADDITIONAL INFORMATION  If you are going to have an upcoming dental procedure, please contact our office as you will require antibiotics ahead of time to prevent infection on your heart valve.    If you have any questions or concerns you can call the structural heart phone during normal business hours 8am-4pm. If you have an urgent need after hours or weekends please call [phone] to talk to the on call provider for general cardiology. If you have an emergency  that requires immediate attention, please call 911.    After TAVR Checklist  Check  Test Description   Follow up appointment in 1-2 weeks  You will see our structural heart physician assistant, Nell Range. Your incision sites will be checked and you will be cleared to drive and resume all normal activities if you are doing well.     1 month echo and follow up  You will have an echo to check on your new heart valve and be seen back in the office by Nell Range. Many times the echo is not read by your appointment time, but Joellen Jersey will call you later that day or the following day to report your results.   Follow up with your primary cardiologist You will need to be seen by your primary cardiologist in the following 3-6 months after your 1 month appointment in the valve clinic. Often times your Plavix or Aspirin will be discontinued during this time, but this is decided on a case by case basis.    1 year echo and follow up You will have another echo to check on your heart valve after 1 year and be seen back in the office by Nell Range. This your last structural heart visit.   Bacterial endocarditis prophylaxis  You will have to take antibiotics for the rest of your life before all dental procedures (even teeth cleanings) to protect your heart valve. Antibiotics are also required before some surgeries. Please check with your cardiologist before scheduling any surgeries. Also, please make sure to tell us if you have a penicillin allergy as you will require an alternative antibiotic.

## 2018-03-04 LAB — CBC
HCT: 24.9 % — ABNORMAL LOW (ref 36.0–46.0)
Hemoglobin: 7.7 g/dL — ABNORMAL LOW (ref 12.0–15.0)
MCH: 27 pg (ref 26.0–34.0)
MCHC: 30.9 g/dL (ref 30.0–36.0)
MCV: 87.4 fL (ref 80.0–100.0)
Platelets: 198 10*3/uL (ref 150–400)
RBC: 2.85 MIL/uL — ABNORMAL LOW (ref 3.87–5.11)
RDW: 15.2 % (ref 11.5–15.5)
WBC: 16.1 10*3/uL — ABNORMAL HIGH (ref 4.0–10.5)
nRBC: 0 % (ref 0.0–0.2)

## 2018-03-04 LAB — BASIC METABOLIC PANEL
Anion gap: 7 (ref 5–15)
BUN: 13 mg/dL (ref 8–23)
CO2: 23 mmol/L (ref 22–32)
Calcium: 8.4 mg/dL — ABNORMAL LOW (ref 8.9–10.3)
Chloride: 104 mmol/L (ref 98–111)
Creatinine, Ser: 0.69 mg/dL (ref 0.44–1.00)
GFR calc Af Amer: >60 mL/min (ref >60)
GFR calc non Af Amer: >60 mL/min (ref >60)
Glucose, Bld: 114 mg/dL — ABNORMAL HIGH (ref 70–99)
Potassium: 5 mmol/L (ref 3.5–5.1)
Sodium: 134 mmol/L — ABNORMAL LOW (ref 135–145)

## 2018-03-04 NOTE — Clinical Social Work Note (Signed)
Clinical Social Work Assessment  Patient Details  Name: Julie Mcgee MRN: [id number] Date of Birth: November 04, 1933  Date of referral:  03/04/18               Reason for consult:  Facility Placement                Permission sought to share information with:  Family Supports Permission granted to share information::  Yes, Verbal Permission Granted  Name::        Agency::     Relationship::     Contact Information:     Housing/Transportation Living arrangements for the past 2 months:  Apartment Source of Information:  Patient, Adult Children Patient Interpreter Needed:  None Criminal Activity/Legal Involvement Pertinent to Current Situation/Hospitalization:  No - Comment as needed Significant Relationships:  Adult Children, Spouse Lives with:  Spouse Do you feel safe going back to the place where you live?  Yes Need for family participation in patient care:  No (Coment)  Care giving concerns:  Patient wants to return home regardless of PT recommendations of SNF. Patient reports her spouse and adult son and wife can provide her appropriate supports. Per patient's son they do not live with her and her spouse is 33 years old and cannot provide support.   Social Worker assessment / plan:  CSW notes patient does not want SNF and has previously indicated she will go if she can be stronger. At assessment patient was adament about not going to a SNF. Patient's son expressed reservations with patient's safety returning home. CSW arranged for patient's son to visit tomorrow and CSW will present and process with the family to come up with a plan.  Employment status:  Retired Forensic scientist:  [employer] PT Recommendations:  [place], [place] / Referral to community resources:     Patient/Family's Response to care:  Patient receives SNF, open to home health. Patient's family against home health, prefers SNF. Family aware that patient has right to choose.    Patient/Family's Understanding of and Emotional Response to Diagnosis, Current Treatment, and Prognosis:  The patient has reservations about her physical health limitations and expressed unrealistic plans of home support to return to home. Patient was willing to discuss with CSW. Patient's family concerned regarding patient's ability to return home safely.   Emotional Assessment Appearance:  Appears stated age Attitude/Demeanor/Rapport:  Guarded, Self-Confident, Charismatic Affect (typically observed):  Apprehensive, Appropriate, Guarded Orientation:  Oriented to Self, Oriented to Place, Oriented to Situation, Oriented to  Time Alcohol / Substance use:  Not Applicable Psych involvement (Current and /or in the community):  No (Comment)  Discharge Needs  Concerns to be addressed:  Patient refuses services, Discharge Planning Concerns, Home Safety Concerns Readmission within the last 30 days:  No Current discharge risk:  Physical Impairment Barriers to Discharge:  Unsafe home situation   Oretha Milch, LCSW 03/04/2018, 4:21 PM

## 2018-03-04 NOTE — Evaluation (Signed)
Physical Therapy Evaluation Patient Details Name: Julie Mcgee MRN: [id number] DOB: [birth year] Today's Date: 03/04/2018   History of Present Illness  Julie Mcgee is a 85 y.o. female with a hx of HTN, HLD, RBBB, previous CVA with residual R hemiparesis, carotid artery disease, moderate mitral stenosis, chronic diastolic CHF with recent admission and severe AS who presented [facility] on [facility]/18 for planned TAVR.  Clinical Impression  Orders received for PT evaluation. Patient demonstrates deficits in functional mobility as indicated below. Will benefit from continued skilled PT to address deficits and maximize function. Will see as indicated and progress as tolerated.  OF NOTE: desaturation to 88% on room air with activity, improved with return of 2 liters O2.  Additionally, Patient stating she does NOT want to go to SNF, repeatedly attempting to have therapist agree with her to return home despite therapist recommendation that SNF would be beneficial and safest disposition for patient in current condition. Informed patient that we will work towards goals of home, but at this time, do feel that ST post acute rehabilitation is in the patient's best interest.     Follow Up Recommendations SNF;Supervision/Assistance - 24 hour(Patient very much desires to return home, does not want SNF)    Equipment Recommendations  None recommended by PT    Recommendations for Other Services       Precautions / Restrictions Precautions Precautions: Fall Restrictions Weight Bearing Restrictions: No      Mobility  Bed Mobility Overal bed mobility: Needs Assistance Bed Mobility: Supine to Sit     Supine to sit: Mod assist     General bed mobility comments: Increased time and effort to perform, Vcs for positional movements, moderate assist to elevate trunk and rotate to EOB.   Transfers Overall transfer level: Needs assistance Equipment used: Rolling walker (2 wheeled) Transfers: Sit to/from  Stand Sit to Stand: Min assist         General transfer comment: Min assist to power to standing, increased time and effort, elevated bed height. heavy reliance on UE support, with modified hand placement one hand on RW during power up.  Ambulation/Gait Ambulation/Gait assistance: Min guard Gait Distance (Feet): 8 Feet Assistive device: Rolling walker (2 wheeled) Gait Pattern/deviations: Step-through pattern;Shuffle;Decreased stride length Gait velocity: Decreased Gait velocity interpretation: <1.8 ft/sec, indicate of risk for recurrent falls General Gait Details: Slow, steady gait with RW and supervision for safety; intermittent cues to maintain closer proximity to RW, pt tends to drift to L-side of it. Intermittent standing rest breaks secondary to DOE/fatigue. SpO2 >90% on RA  Stairs            Wheelchair Mobility    Modified Rankin (Stroke Patients Only)       Balance Overall balance assessment: Needs assistance Sitting-balance support: No upper extremity supported;Feet supported Sitting balance-Leahy Scale: Good     Standing balance support: Bilateral upper extremity supported;During functional activity Standing balance-Leahy Scale: Poor Standing balance comment: reliance on UE support today                             Pertinent Vitals/Pain Pain Assessment: No/denies pain    Home Living Family/patient expects to be discharged to:: Private residence Living Arrangements: Spouse/significant other Available Help at Discharge: Family;Available 24 hours/day Type of Home: Independent living facility Home Access: Level entry     Home Layout: One level Home Equipment: Walker - 2 wheels;Walker - 4 wheels;Cane - single point;Bedside commode;Grab bars -  tub/shower      Prior Function Level of Independence: Independent with assistive device(s)         Comments: Pt used RW outdoors and rollator indoors     Hand Dominance   Dominant Hand:  Right    Extremity/Trunk Assessment   Upper Extremity Assessment Upper Extremity Assessment: Generalized weakness    Lower Extremity Assessment Lower Extremity Assessment: Generalized weakness    Cervical / Trunk Assessment Cervical / Trunk Assessment: (increased body habitus)  Communication   Communication: No difficulties  Cognition Arousal/Alertness: Awake/alert Behavior During Therapy: WFL for tasks assessed/performed Overall Cognitive Status: No family/caregiver present to determine baseline cognitive functioning                                 General Comments: Decreased attention, easily distracted; likely baseline cognition. Baseline dysarthria      General Comments      Exercises     Assessment/Plan    PT Assessment Patient needs continued PT services  PT Problem List Decreased activity tolerance;Decreased balance;Decreased mobility;Decreased knowledge of use of DME;Decreased safety awareness;Decreased knowledge of precautions;Cardiopulmonary status limiting activity       PT Treatment Interventions DME instruction;Gait training;Functional mobility training;Therapeutic activities;Therapeutic exercise;Balance training;Patient/family education    PT Goals (Current goals can be found in the Care Plan section)  Acute Rehab PT Goals Patient Stated Goal: to go home PT Goal Formulation: With patient Time For Goal Achievement: 03/20/18 Potential to Achieve Goals: Fair    Frequency Min 3X/week   Barriers to discharge        Co-evaluation               AM-PAC PT "6 Clicks" Mobility  Outcome Measure Help needed turning from your back to your side while in a flat bed without using bedrails?: A Little Help needed moving from lying on your back to sitting on the side of a flat bed without using bedrails?: A Lot Help needed moving to and from a bed to a chair (including a wheelchair)?: A Little Help needed standing up from a chair using your  arms (e.g., wheelchair or bedside chair)?: A Little Help needed to walk in hospital room?: A Little Help needed climbing 3-5 steps with a railing? : A Lot 6 Click Score: 16    End of Session Equipment Utilized During Treatment: Gait belt Activity Tolerance: Patient tolerated treatment well Patient left: in chair;with call bell/phone within reach Nurse Communication: Mobility status PT Visit Diagnosis: Muscle weakness (generalized) (M62.81)    Time: 5462-7035 PT Time Calculation (min) (ACUTE ONLY): 25 min   Charges:   PT Evaluation $PT Eval Moderate Complexity: 1 Mod PT Treatments $Therapeutic Activity: 8-22 mins        Alben Deeds, PT DPT  Board Certified Neurologic Specialist Acute Rehabilitation Services Pager [phone] Office [phone]   Duncan Dull 03/04/2018, 12:37 PM

## 2018-03-04 NOTE — Progress Notes (Signed)
Progress Note  Patient Name: Kelin Nixon Date of Encounter: 03/04/2018  Primary Cardiologist: Jenean Lindau, MD   Subjective   ":I feel better". Still some chest wall pain.  Inpatient Medications    Scheduled Meds: . sodium chloride   Intravenous Once  . sodium chloride   Intravenous Once  . aspirin  81 mg Oral Daily  . atorvastatin  40 mg Oral q1800  . clopidogrel  75 mg Oral Q breakfast  . ferrous sulfate  325 mg Oral Q breakfast  . furosemide  20 mg Oral Daily  . levothyroxine  100 mcg Oral QAC breakfast   Continuous Infusions: . sodium chloride     PRN Meds: sodium chloride, acetaminophen, ondansetron (ZOFRAN) IV, traMADol   Vital Signs    Vitals:   03/03/18 1619 03/03/18 1754 03/03/18 1947 03/04/18 0536  BP: (!) 142/52  (!) 139/54 (!) 134/56  Pulse: 94 97 88 90  Resp: 13 (!) 21 (!) 26 17  Temp: 99.8 F (37.7 C)  98.3 F (36.8 C) 98.7 F (37.1 C)  TempSrc: Oral  Oral Oral  SpO2: 96% 95% 97% 97%  Weight:    90.8 kg  Height:        Intake/Output Summary (Last 24 hours) at 03/04/2018 0945 Last data filed at 03/03/2018 1633 Gross per 24 hour  Intake 0 ml  Output 750 ml  Net -750 ml   Filed Weights   03/02/18 0500 03/03/18 0500 03/04/18 0536  Weight: 88.2 kg 89.1 kg 90.8 kg    Telemetry    nsr with ventricular pacing - Personally Reviewed  ECG    none - Personally Reviewed  Physical Exam   GEN: No acute distress.   Neck: No JVD Cardiac: RRR, no murmurs, rubs, or gallops.  Respiratory: Clear to auscultation bilaterally. No hematoma GI: Soft, nontender, non-distended  MS: No edema; No deformity. Neuro:  Nonfocal  Psych: Normal affect   Labs    Chemistry Recent Labs  Lab 02/27/18 1505  03/02/18 0241 03/03/18 0310 03/04/18 0256  NA 137   < > 138 135 134*  K 4.3   < > 4.8 4.2 5.0  CL 105   < > 107 105 104  CO2 19*   < > 21* 23 23  GLUCOSE 100*   < > 127* 105* 114*  BUN 28*   < > 21 15 13   CREATININE 0.91   < > 0.96 0.84 0.69    CALCIUM 9.3   < > 8.7* 8.4* 8.4*  PROT 6.9  --   --   --   --   ALBUMIN 3.7  --   --   --   --   AST 18  --   --   --   --   ALT 12  --   --   --   --   ALKPHOS 68  --   --   --   --   BILITOT 0.6  --   --   --   --   GFRNONAA 59*   < > 55* >60 >60  GFRAA >60   < > >60 >60 >60  ANIONGAP 13   < > 10 7 7    < > = values in this interval not displayed.     Hematology Recent Labs  Lab 03/02/18 0241 03/02/18 0937 03/03/18 0310 03/04/18 0256  WBC 17.3*  --  13.5* 16.1*  RBC 2.77*  --  3.02* 2.85*  HGB 7.5* 7.4*  8.1* 7.7*  HCT 24.3* 24.3* 26.5* 24.9*  MCV 87.7  --  87.7 87.4  MCH 27.1  --  26.8 27.0  MCHC 30.9  --  30.6 30.9  RDW 15.5  --  15.4 15.2  PLT 265  --  189 198    Cardiac EnzymesNo results for input(s): TROPONINI in the last 168 hours. No results for input(s): TROPIPOC in the last 168 hours.   BNP Recent Labs  Lab 02/27/18 1505  BNP 1,020.2*     DDimer No results for input(s): DDIMER in the last 168 hours.   Radiology    Dg Chest 2 View  Result Date: 03/03/2018 CLINICAL DATA:  Sore arm.  Status post pacemaker placement. EXAM: CHEST - 2 VIEW COMPARISON:  03/02/2018. FINDINGS: Cardiac pacer with lead tip over the right atrium right ventricle. Prior aortic valve replacement. Stable cardiomegaly. Mild bilateral interstitial prominence and tiny bilateral pleural effusions. Mild CHF can not be excluded. No pneumothorax. Degenerative changes scoliosis thoracic spine. Carotid vascular calcification. IMPRESSION: 1. Cardiac pacer noted with lead tip over the right atrium right ventricle. No evidence of pneumothorax. 2. Prior cardiac valve replacement. Cardiomegaly with mild bilateral interstitial prominence and tiny bilateral pleural effusions suggesting mild CHF. 3.  Carotid vascular disease. Electronically Signed   By: Marcello Moores  Register   On: 03/03/2018 07:08    Cardiac Studies   none  Patient Profile     85 y.o. female admitted for TAVR, complicatated by CHB, s/p  PPM  Assessment & Plan    1. CHB - she has been pacing 100% of the time. 2. PPM - her medtronic DDD PM with a His bundle lead is working normally. 3. TAVR - her exam is good.  4. Disp. - she feels week and TAVR notes that family concerned about her strength and need for rehab. She will be transferred to a SNF when available. She is stable for DC from all cardiac issues at this time. I am concerned about continuing plavix from perspective of pocket hematoma but this appears stable for now.   For questions or updates, please contact [place] Please consult [email or web address] for contact info under Cardiology/STEMI.      Signed, Cristopher Peru, MD  03/04/2018, 9:45 AM  Patient ID: [name], female   DOB: December 23, 1933, 85 y.o.   MRN: [id number]

## 2018-03-05 LAB — BASIC METABOLIC PANEL
Anion gap: 7 (ref 5–15)
BUN: 14 mg/dL (ref 8–23)
CO2: 25 mmol/L (ref 22–32)
Calcium: 8.2 mg/dL — ABNORMAL LOW (ref 8.9–10.3)
Chloride: 100 mmol/L (ref 98–111)
Creatinine, Ser: 0.68 mg/dL (ref 0.44–1.00)
GFR calc Af Amer: >60 mL/min (ref >60)
GFR calc non Af Amer: >60 mL/min (ref >60)
Glucose, Bld: 105 mg/dL — ABNORMAL HIGH (ref 70–99)
Potassium: 4.4 mmol/L (ref 3.5–5.1)
Sodium: 132 mmol/L — ABNORMAL LOW (ref 135–145)

## 2018-03-05 LAB — CBC
HCT: 24.3 % — ABNORMAL LOW (ref 36.0–46.0)
Hemoglobin: 7.7 g/dL — ABNORMAL LOW (ref 12.0–15.0)
MCH: 27.7 pg (ref 26.0–34.0)
MCHC: 31.7 g/dL (ref 30.0–36.0)
MCV: 87.4 fL (ref 80.0–100.0)
Platelets: 222 10*3/uL (ref 150–400)
RBC: 2.78 MIL/uL — ABNORMAL LOW (ref 3.87–5.11)
RDW: 15.3 % (ref 11.5–15.5)
WBC: 16.4 10*3/uL — ABNORMAL HIGH (ref 4.0–10.5)
nRBC: 0 % (ref 0.0–0.2)

## 2018-03-05 MED ORDER — FUROSEMIDE 10 MG/ML IJ SOLN
40.0000 mg | Freq: Once | INTRAMUSCULAR | Status: AC
  Administered 2018-03-05: 40 mg via INTRAVENOUS
  Filled 2018-03-05: qty 4

## 2018-03-05 NOTE — Progress Notes (Signed)
CSW met with patient and family at bedside to discuss recommendation for SNF. Patient unhappy about it, but knows that she needs to go. Patient agreeable to going to [facility] for SNF. Family asked questions about expectations at [facility], insurance coverage, and transportation, and CSW answered questions. CSW reassured patient that rehab would be the best thing for her to being as independent as possible after, that she needs to get her strength back and the rehab will help her do that. Patient and family appreciative of CSW coming to talk to them.  CSW completed referral and sent to [facility]. CSW to follow for discharge when patient is medically stable.  Laveda Abbe, [place] Clinical Social Worker [phone]

## 2018-03-05 NOTE — NC FL2 (Signed)
[place] LEVEL OF CARE SCREENING TOOL     IDENTIFICATION  [name] Birthdate: 1933-03-08 Sex: female Admission Date (Current Location): 02/28/2018  [facility] and [place] Number:  Herbalist and Address:  [facility] [place]. [facility], [place] [address] [place], [place] [address]      Provider Number: [id number]  Attending Physician Name and Address:  Sherren Mocha, MD  Relative Name and Phone Number:       Current Level of Care: Hospital Recommended Level of Care: [place] Prior Approval Number:    Date Approved/Denied:   PASRR Number: [id number]  Discharge Plan: SNF    Current Diagnoses: Patient Active Problem List   Diagnosis Date Noted  . Complete heart block ([place]) 03/02/2018  . Sinus bradycardia   . Cardiac arrest ([place])   . 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 RBBB   . Acute on chronic diastolic heart failure ([place])   . Severe aortic stenosis 05/31/2017  . History of stroke 05/31/2017  . Unstable gait 05/31/2017  . Recurrent falls 05/31/2017  . Hypertension 05/13/2017  . Hyperlipidemia 05/13/2017  . Hypothyroidism 05/13/2017  . Arthritis 05/13/2017    Orientation RESPIRATION BLADDER Height & Weight     Self, Time, Situation, Place  O2([place] 2L) Incontinent Weight: 183 lb 6.8 oz (83.2 kg) Height:  5' 1.5" (156.2 cm)  BEHAVIORAL SYMPTOMS/MOOD NEUROLOGICAL BOWEL NUTRITION STATUS      Continent Diet(heart healthy)  AMBULATORY STATUS COMMUNICATION OF NEEDS Skin   Extensive Assist Verbally Surgical wounds(closed left upper chest, adhesive strip dressing changed daily)                       Personal Care Assistance Level of Assistance  Bathing, Feeding, Dressing Bathing Assistance: Maximum assistance Feeding assistance: Limited assistance Dressing Assistance: Maximum assistance     Functional Limitations Info  Sight, Hearing, Speech Sight Info: Impaired(wears  glasses) Hearing Info: Adequate Speech Info: Impaired(dysarthria)    SPECIAL CARE FACTORS FREQUENCY  PT (By licensed PT), OT (By licensed OT)     PT Frequency: 5x/wk OT Frequency: 5x/wk            Contractures Contractures Info: Not present    Additional Factors Info  Code Status, Allergies Code Status Info: Full Allergies Info: Adhesive Tape           Current Medications (03/05/2018):  This is the current hospital active medication list Current Facility-Administered Medications  Medication Dose Route Frequency Provider Last Rate Last Dose  . 0.9 %  sodium chloride infusion (Manually program via Guardrails IV Fluids)   Intravenous Once Eileen Stanford, PA-C      . 0.9 %  sodium chloride infusion (Manually program via Guardrails IV Fluids)   Intravenous Once Eileen Stanford, PA-C      . 0.9 %  sodium chloride infusion  250 mL Intravenous PRN Eileen Stanford, PA-C      . acetaminophen (TYLENOL) tablet 325-650 mg  325-650 mg Oral Q4H PRN Eileen Stanford, PA-C      . aspirin chewable tablet 81 mg  81 mg Oral Daily Eileen Stanford, PA-C   81 mg at 03/05/18 0818  . atorvastatin (LIPITOR) tablet 40 mg  40 mg Oral q1800 Eileen Stanford, PA-C   40 mg at 03/04/18 1743  . clopidogrel (PLAVIX) tablet 75 mg  75 mg Oral Q breakfast Eileen Stanford, PA-C   75 mg at  03/05/18 0818  . ferrous sulfate tablet 325 mg  325 mg Oral Q breakfast Eileen Stanford, PA-C   325 mg at 03/05/18 0818  . levothyroxine (SYNTHROID, LEVOTHROID) tablet 100 mcg  100 mcg Oral QAC breakfast Eileen Stanford, PA-C   100 mcg at 03/05/18 0600  . ondansetron (ZOFRAN) injection 4 mg  4 mg Intravenous Q6H PRN Eileen Stanford, PA-C      . traMADol Veatrice Bourbon) tablet 50-100 mg  50-100 mg Oral Q4H PRN Eileen Stanford, PA-C   100 mg at 03/05/18 2409     Discharge Medications: Please see discharge summary for a list of discharge medications.  Relevant Imaging Results:  Relevant  Lab Results:   Additional Information SS#: [id number]  Geralynn Ochs, LCSW

## 2018-03-05 NOTE — Progress Notes (Signed)
Progress Note  [name] Date of Encounter: 03/05/2018  Primary Cardiologist: Jenean Lindau, MD   Subjective   Less pain today. "My chest hurts where they did CPR".  Inpatient Medications    Scheduled Meds: . sodium chloride   Intravenous Once  . sodium chloride   Intravenous Once  . aspirin  81 mg Oral Daily  . atorvastatin  40 mg Oral q1800  . clopidogrel  75 mg Oral Q breakfast  . ferrous sulfate  325 mg Oral Q breakfast  . furosemide  20 mg Oral Daily  . levothyroxine  100 mcg Oral QAC breakfast   Continuous Infusions: . sodium chloride     PRN Meds: sodium chloride, acetaminophen, ondansetron (ZOFRAN) IV, traMADol   Vital Signs    Vitals:   03/04/18 0536 03/04/18 1500 03/04/18 1931 03/05/18 0439  BP: (!) 134/56 (!) 113/45 (!) 120/52 (!) 130/54  Pulse: 90 90 94 94  Resp: 17 19 (!) 25 20  Temp: 98.7 F (37.1 C) 98.3 F (36.8 C) 98.7 F (37.1 C) 99.8 F (37.7 C)  TempSrc: Oral Oral Oral Oral  SpO2: 97% 98% 94% 96%  Weight: 90.8 kg   83.2 kg  Height:        Intake/Output Summary (Last 24 hours) at 03/05/2018 0917 Last data filed at 03/05/2018 0715 Gross per 24 hour  Intake 520 ml  Output 950 ml  Net -430 ml   Filed Weights   03/03/18 0500 03/04/18 0536 03/05/18 0439  Weight: 89.1 kg 90.8 kg 83.2 kg    Telemetry    nsr with ventricular pacing - Personally Reviewed  ECG    none - Personally Reviewed  Physical Exam   GEN: No acute distress.   Neck: 7 cm JVD Cardiac: RRR, no murmurs, rubs, or gallops.  Respiratory: Clear to auscultation bilaterally. Small hematoma. GI: Soft, nontender, non-distended  MS: No edema; No deformity. Neuro:  Nonfocal  Psych: Normal affect   Labs    Chemistry Recent Labs  Lab 02/27/18 1505  03/03/18 0310 03/04/18 0256 03/05/18 0305  NA 137   < > 135 134* 132*  K 4.3   < > 4.2 5.0 4.4  CL 105   < > 105 104 100  CO2 19*   < > 23 23 25   GLUCOSE 100*   < > 105* 114* 105*  BUN 28*   <  > 15 13 14   CREATININE 0.91   < > 0.84 0.69 0.68  CALCIUM 9.3   < > 8.4* 8.4* 8.2*  PROT 6.9  --   --   --   --   ALBUMIN 3.7  --   --   --   --   AST 18  --   --   --   --   ALT 12  --   --   --   --   ALKPHOS 68  --   --   --   --   BILITOT 0.6  --   --   --   --   GFRNONAA 59*   < > >60 >60 >60  GFRAA >60   < > >60 >60 >60  ANIONGAP 13   < > 7 7 7    < > = values in this interval not displayed.     Hematology Recent Labs  Lab 03/03/18 0310 03/04/18 0256 03/05/18 0305  WBC 13.5* 16.1* 16.4*  RBC 3.02* 2.85* 2.78*  HGB 8.1* 7.7* 7.7*  HCT 26.5* 24.9* 24.3*  MCV 87.7 87.4 87.4  MCH 26.8 27.0 27.7  MCHC 30.6 30.9 31.7  RDW 15.4 15.2 15.3  PLT 189 198 222    Cardiac EnzymesNo results for input(s): TROPONINI in the last 168 hours. No results for input(s): TROPIPOC in the last 168 hours.   BNP Recent Labs  Lab 02/27/18 1505  BNP 1,020.2*     DDimer No results for input(s): DDIMER in the last 168 hours.   Radiology    No results found.  Cardiac Studies   none  Patient Profile     85 y.o. female admitted for TAVR, complicated by CHB, s/p PPM  Assessment & Plan    1. CHB - she is s/p PPM. 2. PPM - her DDD PPM is working normally.  3. Dyspnea - she had a nice weight loss yesterday although I question accuracy. She is still sob. We will hold off on DC and give more lasix.  For questions or updates, please contact [place] Please consult [email or web address] for contact info under Cardiology/STEMI.      Signed, Cristopher Peru, MD  03/05/2018, 9:17 AM  Patient ID: Julie Mcgee, female   DOB: 06-27-1933, 85 y.o.   MRN: [id number]

## 2018-03-05 NOTE — Progress Notes (Signed)
Patient standing to be weigh and H.R. went up 131. Assist back to bed and H.R. back down to V.Pacing 98. Paqtietn voiced no complaints.

## 2018-03-06 DIAGNOSIS — Z952 Presence of prosthetic heart valve: Secondary | ICD-10-CM | POA: Diagnosis not present

## 2018-03-06 DIAGNOSIS — I5030 Unspecified diastolic (congestive) heart failure: Secondary | ICD-10-CM | POA: Diagnosis not present

## 2018-03-06 DIAGNOSIS — R279 Unspecified lack of coordination: Secondary | ICD-10-CM | POA: Diagnosis not present

## 2018-03-06 DIAGNOSIS — I2510 Atherosclerotic heart disease of native coronary artery without angina pectoris: Secondary | ICD-10-CM | POA: Diagnosis not present

## 2018-03-06 DIAGNOSIS — R001 Bradycardia, unspecified: Secondary | ICD-10-CM | POA: Diagnosis not present

## 2018-03-06 DIAGNOSIS — Z7982 Long term (current) use of aspirin: Secondary | ICD-10-CM | POA: Diagnosis not present

## 2018-03-06 DIAGNOSIS — Z9181 History of falling: Secondary | ICD-10-CM | POA: Diagnosis not present

## 2018-03-06 DIAGNOSIS — R278 Other lack of coordination: Secondary | ICD-10-CM | POA: Diagnosis not present

## 2018-03-06 DIAGNOSIS — D649 Anemia, unspecified: Secondary | ICD-10-CM | POA: Diagnosis not present

## 2018-03-06 DIAGNOSIS — I350 Nonrheumatic aortic (valve) stenosis: Secondary | ICD-10-CM | POA: Diagnosis not present

## 2018-03-06 DIAGNOSIS — R0902 Hypoxemia: Secondary | ICD-10-CM | POA: Diagnosis not present

## 2018-03-06 DIAGNOSIS — I110 Hypertensive heart disease with heart failure: Secondary | ICD-10-CM | POA: Diagnosis not present

## 2018-03-06 DIAGNOSIS — E785 Hyperlipidemia, unspecified: Secondary | ICD-10-CM | POA: Diagnosis not present

## 2018-03-06 DIAGNOSIS — E039 Hypothyroidism, unspecified: Secondary | ICD-10-CM | POA: Diagnosis not present

## 2018-03-06 DIAGNOSIS — M6281 Muscle weakness (generalized): Secondary | ICD-10-CM | POA: Diagnosis not present

## 2018-03-06 DIAGNOSIS — I5033 Acute on chronic diastolic (congestive) heart failure: Secondary | ICD-10-CM | POA: Diagnosis not present

## 2018-03-06 DIAGNOSIS — R0789 Other chest pain: Secondary | ICD-10-CM | POA: Diagnosis not present

## 2018-03-06 DIAGNOSIS — I5032 Chronic diastolic (congestive) heart failure: Secondary | ICD-10-CM | POA: Diagnosis not present

## 2018-03-06 DIAGNOSIS — R41841 Cognitive communication deficit: Secondary | ICD-10-CM | POA: Diagnosis not present

## 2018-03-06 DIAGNOSIS — R0989 Other specified symptoms and signs involving the circulatory and respiratory systems: Secondary | ICD-10-CM | POA: Diagnosis not present

## 2018-03-06 DIAGNOSIS — R2689 Other abnormalities of gait and mobility: Secondary | ICD-10-CM | POA: Diagnosis not present

## 2018-03-06 DIAGNOSIS — I10 Essential (primary) hypertension: Secondary | ICD-10-CM | POA: Diagnosis not present

## 2018-03-06 DIAGNOSIS — I69351 Hemiplegia and hemiparesis following cerebral infarction affecting right dominant side: Secondary | ICD-10-CM | POA: Diagnosis not present

## 2018-03-06 DIAGNOSIS — Z743 Need for continuous supervision: Secondary | ICD-10-CM | POA: Diagnosis not present

## 2018-03-06 DIAGNOSIS — Z48812 Encounter for surgical aftercare following surgery on the circulatory system: Secondary | ICD-10-CM | POA: Diagnosis not present

## 2018-03-06 DIAGNOSIS — Z950 Presence of cardiac pacemaker: Secondary | ICD-10-CM | POA: Diagnosis not present

## 2018-03-06 DIAGNOSIS — I442 Atrioventricular block, complete: Secondary | ICD-10-CM | POA: Diagnosis not present

## 2018-03-06 MED ORDER — ASPIRIN EC 81 MG PO TBEC: 81.0000 mg | DELAYED_RELEASE_TABLET | Freq: Every day | ORAL | Status: DC

## 2018-03-06 MED ORDER — FERROUS SULFATE 325 (65 FE) MG PO TABS: 325.0000 mg | ORAL_TABLET | Freq: Every day | ORAL | 3 refills | Status: DC

## 2018-03-06 MED ORDER — POTASSIUM CHLORIDE ER 20 MEQ PO TBCR: 20.0000 meq | EXTENDED_RELEASE_TABLET | Freq: Every day | ORAL | 3 refills | Status: DC

## 2018-03-06 MED ORDER — CLOPIDOGREL BISULFATE 75 MG PO TABS: 75.0000 mg | ORAL_TABLET | Freq: Every day | ORAL | 1 refills | Status: DC

## 2018-03-06 MED ORDER — FUROSEMIDE 40 MG PO TABS: 40.0000 mg | ORAL_TABLET | Freq: Every day | ORAL | 3 refills | Status: DC

## 2018-03-06 MED ORDER — ASPIRIN EC 81 MG PO TBEC: 325.0000 mg | DELAYED_RELEASE_TABLET | Freq: Every day | ORAL | Status: DC

## 2018-03-06 NOTE — Progress Notes (Signed)
Pt discharged to [facility]. IV and telemetry box removed. Pt transported via [facility]. Family took pt's belongings to the facility prior to discharge.    Ara Kussmaul BSN, RN

## 2018-03-06 NOTE — Care Management Note (Addendum)
Case Management Note Marvetta Gibbons RN, BSN Transitions of Care Unit 4E- RN Case Manager [phone]  Patient Details  Name: Brisia Schuermann MRN: [id number] Date of Birth: July 16, 1933  Subjective/Objective:   Pt admitted s/p TAVR                 Action/Plan: PTA pt lived with spouse at [place] and was active with [facility]- post op recommendation for STSNF, pt agreeable for ST stay at [facility] for rehab prior to [place], CM has notified [facility] of plan for transition to STSNF. CSW following for placement needs to [facility].   Expected Discharge Date:     03/06/18             Expected Discharge Plan:  Skilled Nursing Facility  In-House Referral:  Clinical Social Work  Discharge planning Services  CM Consult  Post Acute Care Choice:  Home Health, Resumption of Svcs/PTA Provider Choice offered to:  Patient  DME Arranged:    DME Agency:     HH Arranged:    [place] Agency:  [facility]  Status of Service:  Completed, signed off  If discussed at [place] of Stay Meetings, dates discussed:    Discharge Disposition: skilled facility.    Additional Comments:  Dawayne Patricia, RN 03/06/2018, 11:20 AM

## 2018-03-06 NOTE — Progress Notes (Signed)
Patient will DC to: [facility] Date: 20/24/2020 Family Notified: [name], son  Transport By:  Corey Harold  Please call patient's son, [name] [phone] once the patient is leaving the unit.  RN, patient, and facility notified of DC. Discharge Summary sent to facility. RN given number for report [phone]. DC packet on chart. Ambulance transport requested for patient.   Clinical Social Worker signing off.  Thurmond Butts, [place] Social Worker [phone]

## 2018-03-06 NOTE — Discharge Summary (Addendum)
[place] VALVE TEAM  Discharge Summary    Patient ID: Julie Mcgee MRN: [id number]; DOB: 12/15/1933  Admit date: 02/28/2018 Discharge date: 03/06/2018  Primary Care Provider: Lyman Bishop, DO  Primary Cardiologist: Julie Lindau, MD (wants to switch to Dr. Radford Pax) / Julie Mcgee & Julie Mcgee (TAVR)  Discharge Diagnoses    Principal Problem:   S/P TAVR (transcatheter aortic valve replacement) Active Problems:   Hypertension   Hyperlipidemia   Hypothyroidism   Severe aortic stenosis   History of stroke   Unstable gait   Recurrent falls   RBBB   Acute on chronic diastolic heart failure (HCC)   Sinus bradycardia   Cardiac arrest (HCC)   Complete heart block (HCC)   Allergies Allergies  Allergen Reactions  . Adhesive [Tape] Other (See Comments)    Causes BLISTERS    Diagnostic Studies/Procedures    TAVR OPERATIVE NOTE   Date of Procedure:02/28/2018  Preoperative Diagnosis:Severe Aortic Stenosis   Postoperative Diagnosis:Same   Procedure:   Transcatheter Aortic Valve Replacement - PercutaneousRightTransfemoral Approach Edwards Sapien 3 THV (size 18mm, model # 9600TFX, serial # [id number])  Co-Surgeons:Julie H. Roxy Manns, MD and Julie Mocha, MD  Anesthesiologist:Julie Therisa Doyne, MD  Echocardiographer:Julie Johnsie Cancel, MD  Pre-operative Echo Findings: ? Severe aortic stenosis ? Mild aortic insufficiency ? Moderate mitral stenosis ? Normalleft ventricular systolic function  Post-operative Echo Findings: ? Noparavalvular leak ? Normalleft ventricular systolic function   __________________   Echo 03/01/18: IMPRESSIONS 1. The left ventricle has normal systolic function, with an ejection fraction of 60-65%. The cavity size was normal. There is moderately increased left ventricular wall  thickness. 2. Left atrial size was severely dilated. 3. Mildly thickened tricuspid valve leaflets. 4. The mitral valve is rheumatic. Severe thickening of the mitral valve leaflet. Severe calcification of the mitral valve leaflet. There is severe mitral annular calcification present. Moderate mitral valve stenosis. 5. The tricuspid valve was normal in structure. Tricuspid valve regurgitation is moderate. 6. Post TAVR with 23 mm Sapien 3 valve Not well visualized no PVL gradients have increased considerably since implant 02/28/18.   __________________  03/02/18 PACEMAKER IMPLANT   CONCLUSIONS:  1. Successful implantation of a Medtronic dual-chamber pacemaker for symptomatic bradycardia due to complete heart block 2. No early apparent complications.        History of Present Illness      Julie Munrois a 85 y.o.femalewith a hx of HTN, HLD, RBBB, previousCVAwith residual R hemiparesis,carotid artery disease, moderate mitral stenosis, chronic diastolic CHF with recent admission andsevere ASwho presented to [facility] for planned TAVR.  Patient states she has known of presence of a heart murmur for many years. She suffered a massive stroke in 2011 which left her with residual right-sided hemiparesis. She apparently had a prolonged rehabilitation. She was followed for aortic stenosis by a local cardiologist, and approximately 1 year ago she moved to [place] to live near her son. She has been followed by Julie Mcgee since May 2019. She admits to a long history of exertional shortness of breath.   Symptoms of shortness of breath rapidly progressed over the last few weeks culminating the patient's hospital admission with resting shortness of breath, chest discomfort, and dizziness without syncope. BNP level was mildly elevated and chest x-ray revealed pulmonary vascular congestion. She was hospitalized for intravenous diuresis. Transthoracic echocardiogram  performed February 20, 2018 revealed normal left ventricular systolic function with moderate mitral stenosis and severe aortic stenosis with mild  aortic insufficiency. Peak velocity across the aortic valve was measured 4.1 m/s corresponding to mean transvalvular gradient estimated 43 mmHg. DVI was 0.25 with aortic valve area calculated 0.7 cm. Mean transvalvular gradient across mitral valve was estimated 9.0 mmHg corresponding to the mitral valve area estimated 1.46 cm using pressure half-time. Left ventricular function appeared normal with ejection fraction estimated greater than 65%. The patient was seen in consultation by Julie Mcgee and underwent left and right heart catheterization on February 21, 2018. This revealed mild to moderate nonobstructive coronary artery disease. There was severe aortic stenosis with peak to peak and mean transvalvular gradients measured 60 and 48 mmHg respectively. Aortic valve area was calculated 0.96 cm. The mitral valve was severely calcified. Mean transvalvular gradient across the mitral valve was measured 10 mmHg corresponding to mitral valve area calculated 1.7 cm.There was mild pulmonary hypertension and mildly elevated right heart pressures.  The patient has been evaluated by the multidisciplinary valve team and felt to have severe, symptomatic aortic stenosis and to be a suitable candidate for TAVR, which was set up for 02/28/18.   Hospital Course     Consultants: electrophysiology  Severe AS:s/p successful TAVR with a51mm Edwards Sapien 3 THV via the TF approach on 02/28/18. Post operative echoshowed EF 60%,mod MS, no PVL, increased gradients with mean gradient of 22 mm Hg. Continue Asprin andPlavix.I will see her back in the office next week for close follow up.   Complete heart block: pt had baseline RBBB. She had transient intra procedural CHB that resolved. Her temp wire was maintained over night and pulled the following morning. On 2/19 she  hadhad CHB w/ventricularstandtill, prolonged pause/bradycardia treated wit epi, CPR >> dopamine >> temp pacing wire. She underwent successful Medtronic dual-chamber PPM by Dr. Lovena Mcgee on 2/20. Wound check next week.   Chest pain: had significant chest pain from compressions. CXR did not show any trauma  Acute on chronic anemia:Hg down to 7.1. Up to 8.1 after 2U PRBCs then drifted down but stable ~7.7.  She has been started on iron supplementation.   Acute on chronic diastolic CHF: as evidenced byadmission last week for acute CHF, persistentlyelevated BNP~1000on pre admission lab work andmildpulmonary vascular congestion on CXR. This has been treated with TAVR.She has had acute heart failure this admission requiring several doses of IV lasix. She has some mild crackles on lung exam but otherwise appears euvolemic and shortness of breath has improved. Will resume home lasix 40mg  daily and Kdur 20 Meq daily.   HTN: BP has been well controlled during admission, but mildly elevated this morning. Will resume home Lopressor 50 mg BID and lasix 40mg  daily.   Leukocytosis: patient noted to have elevated white count ~16 and mild fever (99.50F) today. She does not complain of s/s UTI and denies cough. No other complaints.   Dispo: patient very weak from prolonged bedrest. Also having some mild intermittent delirium. Plan for discharge to [facility] today for short term rehab. Suspect confusion will improve once she returns home.  _____________  Discharge Vitals Blood pressure (!) 150/57, pulse 92, temperature 99.1 F (37.3 C), temperature source Oral, resp. rate 16, height 5' 1.5" (1.562 m), weight 99.3 kg, SpO2 98 %.  Filed Weights   03/04/18 0536 03/05/18 0439 03/06/18 0530  Weight: 90.8 kg 93.2 kg 99.3 kg   VS:  BP (!) 150/57 (BP Location: Right Arm)   Pulse 92   Temp 99.1 F (37.3 C) (Oral)   Resp 16   Ht 5'  1.5" (1.562 m)   Wt 99.3 kg   SpO2 98%   BMI 40.69 kg/m    GEN:  Well nourished, well developed, in no acute distress, obese HEENT: normal Neck: no JVD or masses Cardiac: RRR; 2/6 SEM. No rubs. + gallop. No edema  Respiratory: mild crackles at bases GI: soft, nontender, nondistended, + BS MS: no deformity or atrophy Skin: warm and dry, no rash Neuro:  Alert and Oriented x 3, Strength and sensation are intact Psych: euthymic mood, full affect    Labs & Radiologic Studies    CBC Recent Labs    03/04/18 0256 03/05/18 0305  WBC 16.1* 16.4*  HGB 7.7* 7.7*  HCT 24.9* 24.3*  MCV 87.4 87.4  PLT 198 829   Basic Metabolic Panel Recent Labs    03/04/18 0256 03/05/18 0305  NA 134* 132*  K 5.0 4.4  CL 104 100  CO2 23 25  GLUCOSE 114* 105*  BUN 13 14  CREATININE 0.69 0.68  CALCIUM 8.4* 8.2*   Liver Function Tests No results for input(s): AST, ALT, ALKPHOS, BILITOT, PROT, ALBUMIN in the last 72 hours. No results for input(s): LIPASE, AMYLASE in the last 72 hours. Cardiac Enzymes No results for input(s): CKTOTAL, CKMB, CKMBINDEX, TROPONINI in the last 72 hours. BNP Invalid input(s): POCBNP D-Dimer No results for input(s): DDIMER in the last 72 hours. Hemoglobin A1C No results for input(s): HGBA1C in the last 72 hours. Fasting Lipid Panel No results for input(s): CHOL, HDL, LDLCALC, TRIG, CHOLHDL, LDLDIRECT in the last 72 hours. Thyroid Function Tests No results for input(s): TSH, T4TOTAL, T3FREE, THYROIDAB in the last 72 hours.  Invalid input(s): FREET3 _____________  Dg Chest 2 View  Result Date: 03/03/2018 CLINICAL DATA:  Sore arm.  Status post pacemaker placement. EXAM: CHEST - 2 VIEW COMPARISON:  03/02/2018. FINDINGS: Cardiac pacer with lead tip over the right atrium right ventricle. Prior aortic valve replacement. Stable cardiomegaly. Mild bilateral interstitial prominence and tiny bilateral pleural effusions. Mild CHF can not be excluded. No pneumothorax. Degenerative changes scoliosis thoracic spine. Carotid vascular  calcification. IMPRESSION: 1. Cardiac pacer noted with lead tip over the right atrium right ventricle. No evidence of pneumothorax. 2. Prior cardiac valve replacement. Cardiomegaly with mild bilateral interstitial prominence and tiny bilateral pleural effusions suggesting mild CHF. 3.  Carotid vascular disease. Electronically Signed   By: Marcello Moores  Register   On: 03/03/2018 07:08   Dg Chest 2 View  Result Date: 02/20/2018 CLINICAL DATA:  Congestive heart failure. EXAM: CHEST - 2 VIEW COMPARISON:  Radiographs of February 18, 2018. FINDINGS: Stable cardiomegaly. Atherosclerosis of thoracic aorta is noted. No pneumothorax is noted. Minimal pleural effusions are noted. No consolidative process is noted. Bony thorax is unremarkable. IMPRESSION: Minimal pleural effusions.  No consolidative process is noted. Aortic Atherosclerosis (ICD10-I70.0). Electronically Signed   By: Marijo Conception, M.D.   On: 02/20/2018 09:40   Dg Chest 2 View  Result Date: 02/18/2018 CLINICAL DATA:  Episodes of SOB on exertion for about 2 days, pt also reports she had a episode of non radiating centralized cp along with nausea that lasted around 5 min EXAM: CHEST - 2 VIEW COMPARISON:  None. FINDINGS: The heart is enlarged. There is significant mitral annulus calcification. Pulmonary vascular congestion is present. Patchy opacities are identified in the RIGHT UPPER lobe and RIGHT LOWER lobe. There is an associated RIGHT pleural effusion. L1 compression fracture is chronic. IMPRESSION: 1. Cardiomegaly and pulmonary vascular congestion. 2. RIGHT lung infiltrates and pleural effusion.  Electronically Signed   By: Nolon Nations M.D.   On: 02/18/2018 14:18   Ct Coronary Morph W/cta Cor W/score W/ca W/cm &/or Wo/cm  Addendum Date: 02/24/2018   ADDENDUM REPORT: 02/24/2018 15:02 CLINICAL DATA:  85 year old female with severe aortic stenosis being evaluated for a TAVR procedure. EXAM: Cardiac TAVR CT TECHNIQUE: The patient was scanned on a  [employer]. A 120 kV retrospective scan was triggered in the descending thoracic aorta at 111 HU's. Gantry rotation speed was 250 msecs and collimation was .6 mm. No beta blockade or nitro were given. The 3D data set was reconstructed in 5% intervals of the R-R cycle. Systolic and diastolic phases were analyzed on a dedicated work station using MPR, MIP and VRT modes. The patient received 80 cc of contrast. FINDINGS: Aortic Valve: Trileaflet aortic valve with severely thickened, moderately calcified leaflets with severely restricted leaflet opening and mild asymmetric calcifications extending into the LVOT under the left coronary cusp. Aorta: Normal size with moderate diffuse atherosclerotic disease and calcifications. No dissection. Sinotubular Junction: 27 x 27 mm Ascending Thoracic Aorta: 32 x 31 mm Aortic Arch: 28 x 25 mm Descending Thoracic Aorta: 22 x 22 mm Sinus of Valsalva Measurements: Non-coronary: 30 mm Right -coronary: 31 mm Left -coronary: 30 mm Coronary Artery Height above Annulus: Left Main: 14 mm Right Coronary: 15 mm Virtual Basal Annulus Measurements: Maximum/Minimum Diameter: 24.4 x 19.9 mm Mean Diameter: 21.4 mm Perimeter: 69.6 mm Area: 359 mm2 Optimum Fluoroscopic Angle for Delivery: RAO 8 CRA 7 IMPRESSION: 1. Trileaflet aortic valve with severely thickened, moderately calcified leaflets with severely restricted leaflet opening and mild asymmetric calcifications extending into the LVOT under the left coronary cusp. Annular measurements suitable for delivery of a 23 mm Edwards-SAPIEN 3 valve. 2. Sufficient coronary to annulus distance. 3. Optimum Fluoroscopic Angle for Delivery: RAO 8 CRA 7. 4. No thrombus in the left atrial appendage. 5. Dilated pulmonary artery measuring 38 mm consistent with pulmonary hypertension. Electronically Signed   By: Ena Dawley   On: 02/24/2018 15:02   Result Date: 02/24/2018 EXAM: OVER-READ INTERPRETATION  CT CHEST The following report is an  over-read performed by radiologist Dr. Vinnie Langton of [facility], [place] on 02/24/2018. This over-read does not include interpretation of cardiac or coronary anatomy or pathology. The coronary calcium score/coronary CTA interpretation by the cardiologist is attached. COMPARISON:  None. FINDINGS: Extracardiac findings will be described separately under dictation for contemporaneously obtained CTA chest, abdomen and pelvis. IMPRESSION: Please see separate dictation for contemporaneously obtained CTA chest, abdomen and pelvis dated 02/24/2018 for full description of relevant extracardiac findings. Electronically Signed: By: Vinnie Langton M.D. On: 02/24/2018 10:30   Dg Chest Port 1 View  Result Date: 03/02/2018 CLINICAL DATA:  Chest discomfort.  CPR yesterday EXAM: PORTABLE CHEST 1 VIEW COMPARISON:  Two days ago FINDINGS: Cardiomegaly. Transcatheter aortic valve replacement and extensive/caseous ossification of the mitral valve annulus. There is no edema, consolidation, effusion, or pneumothorax. No appreciable rib fracture. Temporary pacer from below with lead over the right ventricle. Artifact from defibrillator pad over the left chest. IMPRESSION: No interval or traumatic finding. Stable temporary pacer positioning. Electronically Signed   By: Monte Fantasia M.D.   On: 03/02/2018 09:38   Dg Chest Port 1 View  Result Date: 02/28/2018 CLINICAL DATA:  Status post transcatheter aortic valve replacement. EXAM: PORTABLE CHEST 1 VIEW COMPARISON:  Radiographs of February 20, 2018. FINDINGS: Stable cardiomegaly. Atherosclerosis of thoracic aorta is noted. Aortic valve prosthesis is noted. Interval  placement of right internal jugular catheter with tip in expected position of the SVC. No pneumothorax or pleural effusion is noted. Mild Kerley B lines are noted in both lungs suggesting possible minimal pulmonary edema. Bony thorax is unremarkable. IMPRESSION: Mild Kerley B lines are noted in both lungs  suggesting possible minimal pulmonary edema. No pneumothorax or pleural effusion is noted. Aortic Atherosclerosis (ICD10-I70.0). Electronically Signed   By: Marijo Conception, M.D.   On: 02/28/2018 18:50   Ct Angio Chest Aorta W &/or Wo Contrast  Result Date: 02/24/2018 CLINICAL DATA:  85 year old female with history of severe aortic stenosis. Preprocedural study prior to potential transcatheter aortic valve replacement (TAVR) procedure. EXAM: CT ANGIOGRAPHY CHEST, ABDOMEN AND PELVIS TECHNIQUE: Multidetector CT imaging through the chest, abdomen and pelvis was performed using the standard protocol during bolus administration of intravenous contrast. Multiplanar reconstructed images and MIPs were obtained and reviewed to evaluate the vascular anatomy. CONTRAST:  144mL ISOVUE-370 IOPAMIDOL (ISOVUE-370) INJECTION 76% COMPARISON:  No priors. FINDINGS: CTA CHEST FINDINGS Cardiovascular: Heart size is enlarged with left atrial dilatation. There is no significant pericardial fluid, thickening or pericardial calcification. There is aortic atherosclerosis, as well as atherosclerosis of the great vessels of the mediastinum and the coronary arteries, including calcified atherosclerotic plaque in the left main, left anterior descending and right coronary arteries. Severe thickening calcification of the aortic valve. There is also severe thickening calcification of the mitral valve as well as the mitral annulus. Dilatation of the pulmonic trunk (3.8 cm in diameter). Mediastinum/Lymph Nodes: Multiple prominent borderline enlarged and minimally enlarged mediastinal and bilateral hilar lymph nodes, measuring up to 1.7 cm in short axis in the right paratracheal nodal station. Small hiatal hernia. Lungs/Pleura: No suspicious appearing pulmonary nodules or masses are noted. No acute consolidative airspace disease. No pleural effusions. Musculoskeletal/Soft Tissues: Chronic appearing T12 compression fracture with 50% loss of anterior  vertebral body height. There are no aggressive appearing lytic or blastic lesions noted in the visualized portions of the skeleton. CTA ABDOMEN AND PELVIS FINDINGS Hepatobiliary: No suspicious appearing cystic or solid hepatic lesions. No intra or extrahepatic biliary ductal dilatation. Gallbladder is normal in appearance. Pancreas: No pancreatic mass. No pancreatic ductal dilatation. No pancreatic or peripancreatic fluid or inflammatory changes. Spleen: Unremarkable. Adrenals/Urinary Tract: Bilateral kidneys and bilateral adrenal glands are normal in appearance. No hydroureteronephrosis. Urinary bladder is partially obscured by beam hardening artifact from the left hip arthroplasty, but is generally unremarkable in appearance. Stomach/Bowel: Normal appearance of the stomach. No pathologic dilatation of small bowel or colon. The appendix is not confidently identified and may be surgically absent. Regardless, there are no inflammatory changes noted adjacent to the cecum to suggest the presence of an acute appendicitis at this time. Vascular/Lymphatic: Aortic atherosclerosis, without evidence of aneurysm or dissection in the abdominal or pelvic vasculature. Vascular findings and measurements pertinent to potential TAVR procedure, as detailed below. No lymphadenopathy noted in the abdomen or pelvis. Reproductive: Uterus and ovaries are atrophic. Other: No significant volume of ascites.  No pneumoperitoneum. Musculoskeletal: Status post left hip arthroplasty. Chronic appearing compression fracture of L1 with 20% loss of anterior vertebral body height. There are no aggressive appearing lytic or blastic lesions noted in the visualized portions of the skeleton. VASCULAR MEASUREMENTS PERTINENT TO TAVR: AORTA: Minimal Aortic Diameter-13 x 13 mm Severity of Aortic Calcification-severe RIGHT PELVIS: Right Common Iliac Artery - Minimal Diameter-9.8 x 7.1 mm Tortuosity-mild Calcification-severe Right External Iliac Artery -  Minimal Diameter-5.2 x 5.0 mm Tortuosity-moderate Calcification-moderate to severe  Right Common Femoral Artery - Minimal Diameter-8.2 x 7.6 mm Tortuosity-mild Calcification-moderate LEFT PELVIS: Left Common Iliac Artery - Minimal Diameter-9.3 x 7.1 mm Tortuosity-mild Calcification-severe Left External Iliac Artery - Minimal Diameter-7.9 x 2.6 mm Tortuosity-moderate Calcification-moderate severe Left Common Femoral Artery - Minimal Diameter-7.5 x 8.0 mm Tortuosity-mild Calcification-moderate Review of the MIP images confirms the above findings. IMPRESSION: 1. Vascular findings and measurements pertinent to potential TAVR procedure, as detailed above. 2. Severe thickening calcification of the aortic valve, compatible with reported clinical history of severe aortic stenosis. 3. There is also severe thickening calcification of the mitral valve and mitral annulus. 4. Cardiomegaly with left atrial dilatation. 5. Dilatation of the pulmonic trunk (3.8 cm in diameter), concerning for pulmonary arterial hypertension. 6. Aortic atherosclerosis, in addition to left main and 2 vessel coronary artery disease. 7. Additional incidental findings, as above. Electronically Signed   By: Vinnie Langton M.D.   On: 02/24/2018 11:04   Vas US Carotid  Result Date: 02/21/2018 Carotid Arterial Duplex Study Indications: TAVR. Performing Technologist: June Leap RDMS, RVT  Examination Guidelines: A complete evaluation includes B-mode imaging, spectral Doppler, color Doppler, and power Doppler as needed of all accessible portions of each vessel. Bilateral testing is considered an integral part of a complete examination. Limited examinations for reoccurring indications may be performed as noted.  Right Carotid Findings: +----------+--------+--------+--------+------------+--------+           PSV cm/sEDV cm/sStenosisDescribe    Comments +----------+--------+--------+--------+------------+--------+ CCA Prox  81                                            +----------+--------+--------+--------+------------+--------+ CCA Distal61      7                                    +----------+--------+--------+--------+------------+--------+ ICA Prox  69      18      1-39%   heterogenous         +----------+--------+--------+--------+------------+--------+ ICA Distal53      12                                   +----------+--------+--------+--------+------------+--------+ ECA       81      12                                   +----------+--------+--------+--------+------------+--------+ +----------+--------+-------+----------------+-------------------+           PSV cm/sEDV cmsDescribe        Arm Pressure (mmHG) +----------+--------+-------+----------------+-------------------+ [id number]            Multiphasic, WNL                    +----------+--------+-------+----------------+-------------------+ +---------+--------+--------+------+ VertebralPSV cm/sEDV cm/sAbsent +---------+--------+--------+------+  Left Carotid Findings: +----------+--------+--------+--------+-------------------------+--------+           PSV cm/sEDV cm/sStenosisDescribe                 Comments +----------+--------+--------+--------+-------------------------+--------+ CCA Prox  95      9                                                 +----------+--------+--------+--------+-------------------------+--------+  CCA Distal75      8                                                 +----------+--------+--------+--------+-------------------------+--------+ ICA Prox  120     21      1-39%   heterogenous and calcific         +----------+--------+--------+--------+-------------------------+--------+ ICA Distal124     15                                                +----------+--------+--------+--------+-------------------------+--------+ ECA       218     7                                                  +----------+--------+--------+--------+-------------------------+--------+ +----------+--------+--------+----------------+-------------------+ SubclavianPSV cm/sEDV cm/sDescribe        Arm Pressure (mmHG) +----------+--------+--------+----------------+-------------------+           158             Multiphasic, WNL                    +----------+--------+--------+----------------+-------------------+ +---------+--------+--+--------+--+---------+ VertebralPSV cm/s50EDV cm/s12Antegrade +---------+--------+--+--------+--+---------+  Summary: Right Carotid: Velocities in the right ICA are consistent with a 1-39% stenosis. Left Carotid: Velocities in the left ICA are consistent with a 1-39% stenosis.  *See table(s) above for measurements and observations.  Electronically signed by Deitra Mayo MD on 02/21/2018 at 4:51:54 PM.    Final    Ct Angio Abd/pel W/ And/or W/o  Result Date: 02/24/2018 CLINICAL DATA:  85 year old female with history of severe aortic stenosis. Preprocedural study prior to potential transcatheter aortic valve replacement (TAVR) procedure. EXAM: CT ANGIOGRAPHY CHEST, ABDOMEN AND PELVIS TECHNIQUE: Multidetector CT imaging through the chest, abdomen and pelvis was performed using the standard protocol during bolus administration of intravenous contrast. Multiplanar reconstructed images and MIPs were obtained and reviewed to evaluate the vascular anatomy. CONTRAST:  157mL ISOVUE-370 IOPAMIDOL (ISOVUE-370) INJECTION 76% COMPARISON:  No priors. FINDINGS: CTA CHEST FINDINGS Cardiovascular: Heart size is enlarged with left atrial dilatation. There is no significant pericardial fluid, thickening or pericardial calcification. There is aortic atherosclerosis, as well as atherosclerosis of the great vessels of the mediastinum and the coronary arteries, including calcified atherosclerotic plaque in the left main, left anterior descending and right coronary arteries. Severe thickening  calcification of the aortic valve. There is also severe thickening calcification of the mitral valve as well as the mitral annulus. Dilatation of the pulmonic trunk (3.8 cm in diameter). Mediastinum/Lymph Nodes: Multiple prominent borderline enlarged and minimally enlarged mediastinal and bilateral hilar lymph nodes, measuring up to 1.7 cm in short axis in the right paratracheal nodal station. Small hiatal hernia. Lungs/Pleura: No suspicious appearing pulmonary nodules or masses are noted. No acute consolidative airspace disease. No pleural effusions. Musculoskeletal/Soft Tissues: Chronic appearing T12 compression fracture with 50% loss of anterior vertebral body height. There are no aggressive appearing lytic or blastic lesions noted in the visualized portions of the skeleton. CTA ABDOMEN AND PELVIS FINDINGS Hepatobiliary: No suspicious appearing cystic or solid hepatic lesions. No intra or  extrahepatic biliary ductal dilatation. Gallbladder is normal in appearance. Pancreas: No pancreatic mass. No pancreatic ductal dilatation. No pancreatic or peripancreatic fluid or inflammatory changes. Spleen: Unremarkable. Adrenals/Urinary Tract: Bilateral kidneys and bilateral adrenal glands are normal in appearance. No hydroureteronephrosis. Urinary bladder is partially obscured by beam hardening artifact from the left hip arthroplasty, but is generally unremarkable in appearance. Stomach/Bowel: Normal appearance of the stomach. No pathologic dilatation of small bowel or colon. The appendix is not confidently identified and may be surgically absent. Regardless, there are no inflammatory changes noted adjacent to the cecum to suggest the presence of an acute appendicitis at this time. Vascular/Lymphatic: Aortic atherosclerosis, without evidence of aneurysm or dissection in the abdominal or pelvic vasculature. Vascular findings and measurements pertinent to potential TAVR procedure, as detailed below. No lymphadenopathy noted  in the abdomen or pelvis. Reproductive: Uterus and ovaries are atrophic. Other: No significant volume of ascites.  No pneumoperitoneum. Musculoskeletal: Status post left hip arthroplasty. Chronic appearing compression fracture of L1 with 20% loss of anterior vertebral body height. There are no aggressive appearing lytic or blastic lesions noted in the visualized portions of the skeleton. VASCULAR MEASUREMENTS PERTINENT TO TAVR: AORTA: Minimal Aortic Diameter-13 x 13 mm Severity of Aortic Calcification-severe RIGHT PELVIS: Right Common Iliac Artery - Minimal Diameter-9.8 x 7.1 mm Tortuosity-mild Calcification-severe Right External Iliac Artery - Minimal Diameter-5.2 x 5.0 mm Tortuosity-moderate Calcification-moderate to severe Right Common Femoral Artery - Minimal Diameter-8.2 x 7.6 mm Tortuosity-mild Calcification-moderate LEFT PELVIS: Left Common Iliac Artery - Minimal Diameter-9.3 x 7.1 mm Tortuosity-mild Calcification-severe Left External Iliac Artery - Minimal Diameter-7.9 x 2.6 mm Tortuosity-moderate Calcification-moderate severe Left Common Femoral Artery - Minimal Diameter-7.5 x 8.0 mm Tortuosity-mild Calcification-moderate Review of the MIP images confirms the above findings. IMPRESSION: 1. Vascular findings and measurements pertinent to potential TAVR procedure, as detailed above. 2. Severe thickening calcification of the aortic valve, compatible with reported clinical history of severe aortic stenosis. 3. There is also severe thickening calcification of the mitral valve and mitral annulus. 4. Cardiomegaly with left atrial dilatation. 5. Dilatation of the pulmonic trunk (3.8 cm in diameter), concerning for pulmonary arterial hypertension. 6. Aortic atherosclerosis, in addition to left main and 2 vessel coronary artery disease. 7. Additional incidental findings, as above. Electronically Signed   By: Vinnie Langton M.D.   On: 02/24/2018 11:04   Disposition   Pt is being discharged to [facility]  today in good condition.  Follow-up Plans & Appointments     Contact information for follow-up providers    [facility] Follow up.   Specialty:  Cardiology Why:  03/16/2018 @ 1pm, wound check visit Contact information: [address] [place]       Evans Lance, MD Follow up.   Specialty:  Cardiology Why:  06/06/2018 @ 12:30PM Contact information: [address] [place] [address] [phone]        Eileen Stanford, PA-C. Go on 03/16/2018.   Specialties:  Cardiology, Radiology Why:  @ 12:30, please arrive at least 10 minutes early.  Contact information: [address] [place] [address] [phone]            Contact information for after-discharge care    Destination    [facility] .   Service:  Skilled Nursing Contact information: [address] [place] [phone]  Discharge Medications   Allergies as of 03/06/2018      Reactions   Adhesive [tape] Other (See Comments)   Causes BLISTERS      Medication List    STOP taking these medications   potassium chloride SA 20 MEQ tablet Commonly known as:  K-DUR,KLOR-CON     TAKE these medications   aspirin EC 81 MG tablet Take 1 tablet (81 mg total) by mouth daily. What changed:    medication strength  how much to take   CALTRATE 600+D3 PO Take 1 tablet by mouth 2 (two) times daily.   clopidogrel 75 MG tablet Commonly known as:  PLAVIX Take 1 tablet (75 mg total) by mouth daily with breakfast. Start taking on:  March 07, 2018   ferrous sulfate 325 (65 FE) MG tablet Take 1 tablet (325 mg total) by mouth daily with breakfast. Start taking on:  March 07, 2018   furosemide 40 MG tablet Commonly known as:  LASIX Take 1 tablet (40 mg total) by mouth daily.   levothyroxine 100  MCG tablet Commonly known as:  SYNTHROID, LEVOTHROID Take 100 mcg by mouth daily before breakfast.   metoprolol tartrate 50 MG tablet Commonly known as:  LOPRESSOR Take 50 mg by mouth 2 (two) times daily.   MULTIVITAMIN ADULTS 50+ Tabs Take 1 tablet by mouth daily.   Potassium Chloride ER 20 MEQ Tbcr Take 20 mEq by mouth daily.   simvastatin 80 MG tablet Commonly known as:  ZOCOR Take 80 mg by mouth every evening.         Outstanding Labs/Studies   BMET  Duration of Discharge Encounter   Greater than 30 minutes including physician time.  Mable Fill, PA-C 03/06/2018, 12:15 PM [phone]  Patient seen, examined. Available data reviewed. Agree with findings, assessment, and plan as outlined by Nell Range, PA-C. On my exam, pt is an alert, elderly woman in NAD. Lungs CTA, heart RRR with a 2/6 SEM at the RUSB. Abd: soft, NT. Ext: no edema. Agree with findings and plan as above. Pt on Fe replacement, ASA and plavix, and a beta blocker. Needs SNF for rehab and this is arranged. Post-hospital follow-up arranged.   Julie Mcgee, M.D. 03/06/2018 2:22 PM

## 2018-03-06 NOTE — Progress Notes (Addendum)
Attempted to call report to [facility]. No response after being on hold for over 4 minutes. Pt's son [name] notified of pt's transport to facility.

## 2018-03-06 NOTE — Clinical Social Work Placement (Signed)
   CLINICAL SOCIAL WORK PLACEMENT  NOTE  Date:  03/06/2018  Patient Details  Name: Julie Mcgee MRN: [id number] Date of Birth: May 11, 1933  Clinical Social Work is seeking post-discharge placement for this patient at the [facility] level of care (*CSW will initial, date and re-position this form in  chart as items are completed):  Yes   Patient/family provided with [facility] Work Department's list of facilities offering this level of care within the geographic area requested by the patient (or if unable, by the patient's family).  Yes   Patient/family informed of their freedom to choose among providers that offer the needed level of care, that participate in Medicare, Medicaid or managed care program needed by the patient, have an available bed and are willing to accept the patient.  Yes   Patient/family informed of [facility]'s ownership interest in [facility] and [facility], as well as of the fact that they are under no obligation to receive care at these facilities.  PASRR submitted to EDS on       PASRR number received on 03/05/18     Existing PASRR number confirmed on       FL2 transmitted to all facilities in geographic area requested by pt/family on       FL2 transmitted to all facilities within larger geographic area on       Patient informed that his/her managed care company has contracts with or will negotiate with certain facilities, including the following:        Yes   Patient/family informed of bed offers received.  Patient chooses bed at ([facility])     Physician recommends and patient chooses bed at      Patient to be transferred to ([facility]) on  .  Patient to be transferred to facility by [facility]     Patient family notified on 03/06/18 of transfer.  Name of family member notified:  [name], son     PHYSICIAN       Additional Comment:     _______________________________________________ Vinie Sill, [place] 03/06/2018, 1:02 PM

## 2018-03-07 DIAGNOSIS — I350 Nonrheumatic aortic (valve) stenosis: Secondary | ICD-10-CM | POA: Diagnosis not present

## 2018-03-07 DIAGNOSIS — E039 Hypothyroidism, unspecified: Secondary | ICD-10-CM | POA: Diagnosis not present

## 2018-03-07 DIAGNOSIS — R0789 Other chest pain: Secondary | ICD-10-CM | POA: Diagnosis not present

## 2018-03-07 DIAGNOSIS — I442 Atrioventricular block, complete: Secondary | ICD-10-CM | POA: Diagnosis not present

## 2018-03-09 ENCOUNTER — Ambulatory Visit: Payer: Medicare Other | Admitting: Physician Assistant

## 2018-03-14 ENCOUNTER — Ambulatory Visit: Payer: Medicare Other

## 2018-03-14 DIAGNOSIS — D649 Anemia, unspecified: Secondary | ICD-10-CM | POA: Diagnosis not present

## 2018-03-14 DIAGNOSIS — I442 Atrioventricular block, complete: Secondary | ICD-10-CM | POA: Diagnosis not present

## 2018-03-14 DIAGNOSIS — I350 Nonrheumatic aortic (valve) stenosis: Secondary | ICD-10-CM | POA: Diagnosis not present

## 2018-03-14 DIAGNOSIS — I5030 Unspecified diastolic (congestive) heart failure: Secondary | ICD-10-CM | POA: Diagnosis not present

## 2018-03-15 NOTE — Progress Notes (Signed)
[facility]                                       Cardiology Office Note    Date:  03/16/2018   ID:  Julie Mcgee, DOB 12-Jul-1933, MRN [id number]  PCP:  Lyman Bishop, DO  Cardiologist:   Dr. Radford Pax (patient requested to switch Dr. Radford Pax)  / Dr. Burt Knack & Dr. Roxy Manns (TAVR)  CC: TOC s/p TAVR   History of Present Illness:  Julie Mcgee is a 85 y.o. female with a history of HTN, HLD, RBBB, previousCVAwith residual R hemiparesis,carotid artery disease, moderate mitral stenosis, chronic diastolic CHF with recent admission andsevere ASs/p TAVR (02/28/18) c/b CHB requiring PPM placement who presents to clinic for follow up.   She was admitted in early feb for acute CHF. Transthoracic echocardiogram performed February 20, 2018 revealed normal left ventricular systolic function with moderate mitral stenosis and severe aortic stenosis with mild aortic insufficiency. Peak velocity across the aortic valve was measured 4.1 m/s corresponding to mean transvalvular gradient estimated 43 mmHg. DVI was 0.25 with aortic valve area calculated 0.7 cm. Mean transvalvular gradient across mitral valve was estimated 9.0 mmHg corresponding to the mitral valve area estimated 1.46 cm using pressure half-time. Left ventricular function appeared normal with ejection fraction estimated greater than 65%. The patient was seen in consultation by Dr. Burt Knack and underwent left and right heart catheterization on February 21, 2018. This revealed mild to moderate nonobstructive coronary artery disease. There was severe aortic stenosis with peak to peak and mean transvalvular gradients measured 60 and 48 mmHg respectively. Aortic valve area was calculated 0.96 cm. The mitral valve was severely calcified. Mean transvalvular gradient across the mitral valve was measured 10 mmHg corresponding to mitral valve area calculated 1.7 cm.There was mild pulmonary hypertension and  mildly elevated right heart pressures.  She underwent TAVR with a24mm Edwards Sapien 3 THV via the TF approach on 02/28/18. Post operative echoshowed EF 60%, mod MS, no PVL, increased gradients with mean gradient of 22 mm Hg likely due to patients large size with a 23 mm valve. Patient's hospital course was c/b CHB with asystole requiring brief CPR and ionotropes. She underwent PPM by Dr. Lovena Le on 03/02/18. She also had post operative anemia requiring 2 U PRBC transfusion and acute CHF requiring IV diuresis. Patient became very weak after prolonged bedrest and was discharged to [facility]. She was discharged on Aspirin and Plavix.  Today she presents to clinic for follow up. Still at [facility]. Doing well working with PT to build strength. She hopes to return to her ILF tomorrow although son doesn't think this will happen. She feels great with no complaints. No CP or SOB. No LE edema, orthopnea or PND. No dizziness or syncope. No blood in stool or urine. No palpitations. Son notes that she has had some hypoxia with ambulation with 02 sats in low 80s.    Past Medical History:  Diagnosis Date  . Arthritis   . Carotid artery stenosis   . Chronic diastolic CHF (congestive heart failure) ([facility])   . Former smoker    1/2 ppd for 45 years  . 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 Hyperlipidemia   . Hypertension   . Hypothyroidism   . Peripheral vascular disease ([facility])   . RBBB   .  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 Severe aortic stenosis     Past Surgical History:  Procedure Laterality Date  . BASAL CELL CARCINOMA EXCISION  2018  . CARDIAC CATHETERIZATION    . COLONOSCOPY  2001  . HIP SURGERY  2005  . KNEE SURGERY  1998  . PACEMAKER IMPLANT N/A 03/02/2018   Procedure: PACEMAKER IMPLANT;  Surgeon: Evans Lance, MD;  Location: [facility];  Service: Cardiovascular;  Laterality: N/A;  . RIGHT/LEFT HEART CATH AND CORONARY ANGIOGRAPHY N/A 02/21/2018   Procedure: RIGHT/LEFT HEART CATH AND CORONARY ANGIOGRAPHY;  Surgeon: Sherren Mocha, MD;  Location: [facility];  Service: Cardiovascular;  Laterality: N/A;  . TEE WITHOUT CARDIOVERSION N/A 02/28/2018   Procedure: TRANSESOPHAGEAL ECHOCARDIOGRAM (TEE);  Surgeon: Sherren Mocha, MD;  Location: [facility];  Service: Open Heart Surgery;  Laterality: N/A;  . TEMPORARY PACEMAKER N/A 03/01/2018   Procedure: TEMPORARY PACEMAKER;  Surgeon: Burnell Blanks, MD;  Location: [facility];  Service: Cardiovascular;  Laterality: N/A;  . TONSILLECTOMY    . TRANSCATHETER AORTIC VALVE REPLACEMENT, TRANSFEMORAL N/A 02/28/2018   Procedure: TRANSCATHETER AORTIC VALVE REPLACEMENT, TRANSFEMORAL;  Surgeon: Sherren Mocha, MD;  Location: [facility];  Service: Open Heart Surgery;  Laterality: N/A;    Current Medications: Outpatient Medications Prior to Visit  Medication Sig Dispense Refill  . acetaminophen (TYLENOL) 325 MG tablet Take 650 mg by mouth 2 (two) times daily.    [name] aspirin EC 81 MG tablet Take 1 tablet (81 mg total) by mouth daily.    . Calcium Carb-Cholecalciferol (CALTRATE 600+D3 PO) Take 1 tablet by mouth 2 (two) times daily.    . clopidogrel (PLAVIX) 75 MG tablet Take 1 tablet (75 mg total) by mouth daily with breakfast. 90 tablet 1  . diphenhydrAMINE (BENADRYL) 25 MG tablet Take 25 mg by mouth every 6 (six) hours as needed.    . ferrous sulfate 325 (65 FE) MG tablet Take 325 mg by mouth 2 (two) times daily with a meal.    . furosemide (LASIX) 40 MG tablet Take 1 tablet (40 mg total) by mouth daily. 90 tablet 3  . levothyroxine (SYNTHROID, LEVOTHROID) 125 MCG tablet Take 125 mcg by mouth daily before breakfast.    . Melatonin 5 MG CAPS Take 5 mg by mouth at bedtime.    . metoprolol tartrate (LOPRESSOR) 50 MG tablet Take  50 mg by mouth 2 (two) times daily.    . Multiple Vitamins-Minerals (MULTIVITAMIN ADULTS 50+) TABS Take 1 tablet by mouth daily.    . potassium chloride 20 MEQ TBCR Take 20 mEq by mouth daily. 90 tablet 3  . simvastatin (ZOCOR) 80 MG tablet Take 80 mg by mouth every evening.    . ferrous sulfate 325 (65 FE) MG tablet Take 1 tablet (325 mg total) by mouth daily with breakfast. 90 tablet 3  . levothyroxine (SYNTHROID, LEVOTHROID) 100 MCG tablet Take 100 mcg by mouth daily before breakfast.     No facility-administered medications prior to visit.      Allergies:   Adhesive [tape]   Social History   Socioeconomic History  . Marital status: Married    Spouse name: Not on file  . Number of children: Not on file  . Years of education: Not on file  . Highest education level: Not on file  Occupational History  .  Not on file  Social Needs  . Financial resource strain: Not on file  . Food insecurity:    Worry: Not on file    Inability: Not on file  . Transportation needs:    Medical: Not on file    Non-medical: Not on file  Tobacco Use  . Smoking status: Former Smoker    Packs/day: 1.00    Years: 45.00    Pack years: 45.00    Types: Cigarettes    Last attempt to quit: 05/23/2009    Years since quitting: 8.8  . Smokeless tobacco: Never Used  Substance and Sexual Activity  . Alcohol use: Not Currently  . Drug use: Never  . Sexual activity: Not on file  Lifestyle  . Physical activity:    Days per week: Not on file    Minutes per session: Not on file  . Stress: Not on file  Relationships  . Social connections:    Talks on phone: Not on file    Gets together: Not on file    Attends religious service: Not on file    Active member of club or organization: Not on file    Attends meetings of clubs or organizations: Not on file    Relationship status: Not on file  Other Topics Concern  . Not on file  Social History Narrative  . Not on file     Family History:  The patient's  family history includes Alcohol abuse in her father; Chronic Renal Failure in her mother; Heart failure in her mother.      ROS:   Please see the history of present illness.    ROS All other systems reviewed and are negative.   PHYSICAL EXAM:   VS:  BP (!) 100/44   Pulse 77   Ht 5' 1.5" (1.562 m)   Wt 196 lb 12.8 oz (89.3 kg)   SpO2 99%   BMI 36.58 kg/m    GEN: Well nourished, well developed, in no acute distress, obese HEENT: normal Neck: no JVD or masses Cardiac: RRR; 2/6 SEM @ RUSB. No rubs, or gallops,no edema  Respiratory:  clear to auscultation bilaterally, normal work of breathing GI: soft, nontender, nondistended, + BS MS: no deformity or atrophy Skin: warm and dry, no rash. Groin sites clear with no hematoma or eccymosis Neuro:  Alert and Oriented x 3, Strength and sensation are intact Psych: euthymic mood, full affect   Wt Readings from Last 3 Encounters:  03/16/18 196 lb 12.8 oz (89.3 kg)  03/06/18 218 lb 14.7 oz (99.3 kg)  02/27/18 (P) 180 lb (81.6 kg)      Studies/Labs Reviewed:   EKG:  EKG is NOT ordered today.    Recent Labs: 02/18/2018: TSH 3.355 02/27/2018: ALT 12; B Natriuretic Peptide 1,020.2 03/01/2018: Magnesium 2.1 03/05/2018: BUN 14; Creatinine, Ser 0.68; Hemoglobin 7.7; Platelets 222; Potassium 4.4; Sodium 132   Lipid Panel No results found for: CHOL, TRIG, HDL, CHOLHDL, VLDL, LDLCALC, LDLDIRECT  Additional studies/ records that were reviewed today include:  TAVR OPERATIVE NOTE   Date of Procedure:02/28/2018  Preoperative Diagnosis:Severe Aortic Stenosis   Postoperative Diagnosis:Same   Procedure:   Transcatheter Aortic Valve Replacement - PercutaneousRightTransfemoral Approach Edwards Sapien 3 THV (size 54mm, model # 9600TFX, serial # [id number])  Co-Surgeons:Clarence H. Roxy Manns, MD and Sherren Mocha, MD  Anesthesiologist:William Therisa Doyne, MD  Echocardiographer:Peter Johnsie Cancel, MD  Pre-operative Echo Findings: ? Severe aortic stenosis ? Mild aortic insufficiency ? Moderate mitral stenosis ? Normalleft ventricular systolic function  Post-operative Echo Findings: ? Noparavalvular leak ? Normalleft ventricular systolic function   __________________   Echo 03/01/18: IMPRESSIONS 1. The left ventricle has normal systolic function, with an ejection fraction of 60-65%. The cavity size was normal. There is moderately increased left ventricular wall thickness. 2. Left atrial size was severely dilated. 3. Mildly thickened tricuspid valve leaflets. 4. The mitral valve is rheumatic. Severe thickening of the mitral valve leaflet. Severe calcification of the mitral valve leaflet. There is severe mitral annular calcification present. Moderate mitral valve stenosis. 5. The tricuspid valve was normal in structure. Tricuspid valve regurgitation is moderate. 6. Post TAVR with 23 mm Sapien 3 valve Not well visualized no PVL gradients have increased considerably since implant 02/28/18.  __________________  03/02/18 PACEMAKER IMPLANT   CONCLUSIONS:  1. Successful implantation of a Medtronic dual-chamber pacemaker for symptomatic bradycardia due to complete heart block 2. No early apparent complications.     ASSESSMENT & PLAN:   Severe AS s/p TAVR:doing excellent. She feels great. Groin sites are clear. Son notes that she has had some hypoxia with ambulation with 02 sats in low 80s. 02 sats 99% here. If this continues we will check ambulatory sats next visit and order home 02 if indicated. SBE prophylaxis discussed; I have RX'd amoxicillin.  I will see her back in a few weeks for follow up and echo.   CHB s/p PPM: was seen in device clinic today and PPM working well. Site healing well.   Anemia: Hg down to 7.6 per my review of labs from [facility]. Will check labs today and consider  hematology referral if it remains low. Groin sites healing well. Continue iron supplementation.  Chronic diastolic CHF: appears euvolemic. Continue lasix 40mg  daily. BMET today  HTN: BP well controlled today  Of note patient lives in [place] and she has requested that her care be transferred to [facility]. Dr. Radford Pax saw her in consult during her admission and she would like to follow with her. Her husband, Affie Gasner, would also like to transfer to her. We will schedule them both appointments in June to see Dr. Radford Pax.    Medication Adjustments/Labs and Tests Ordered: Current medicines are reviewed at length with the patient today.  Concerns regarding medicines are outlined above.  Medication changes, Labs and Tests ordered today are listed in the Patient Instructions below. Patient Instructions  Medication Instructions:  1) Your provider discussed the importance of taking an antibiotic prior to all dental visits to prevent damage to the heart valves from infection. You were given a prescription for AMOXIL 2,000 mg to take one hour prior to any dental appointment.   Labwork: TODAY: BMET, CBC  Testing/Procedures: No new orders  Follow-Up: Please keep your follow-up appointments with the Structural Heart Team!  You have been scheduled to establish with Dr. Radford Pax on 06/16/2018 at 2:40PM.    Signed, Angelena Form, PA-C  03/16/2018 1:18 PM    [address], [place], [place]  [address] Phone: [phone]; Fax: [phone]

## 2018-03-16 ENCOUNTER — Ambulatory Visit (INDEPENDENT_AMBULATORY_CARE_PROVIDER_SITE_OTHER): Payer: Medicare Other | Admitting: Physician Assistant

## 2018-03-16 ENCOUNTER — Encounter: Payer: Self-pay | Admitting: Physician Assistant

## 2018-03-16 ENCOUNTER — Ambulatory Visit (INDEPENDENT_AMBULATORY_CARE_PROVIDER_SITE_OTHER): Payer: Medicare Other | Admitting: Nurse Practitioner

## 2018-03-16 ENCOUNTER — Other Ambulatory Visit: Payer: Self-pay | Admitting: Physician Assistant

## 2018-03-16 VITALS — BP 100/44 | HR 77 | Ht 61.5 in | Wt 196.8 lb

## 2018-03-16 DIAGNOSIS — I10 Essential (primary) hypertension: Secondary | ICD-10-CM | POA: Diagnosis not present

## 2018-03-16 DIAGNOSIS — I5032 Chronic diastolic (congestive) heart failure: Secondary | ICD-10-CM

## 2018-03-16 DIAGNOSIS — Z952 Presence of prosthetic heart valve: Secondary | ICD-10-CM

## 2018-03-16 DIAGNOSIS — I442 Atrioventricular block, complete: Secondary | ICD-10-CM | POA: Diagnosis not present

## 2018-03-16 DIAGNOSIS — D649 Anemia, unspecified: Secondary | ICD-10-CM

## 2018-03-16 DIAGNOSIS — Z950 Presence of cardiac pacemaker: Secondary | ICD-10-CM

## 2018-03-16 LAB — CUP PACEART INCLINIC DEVICE CHECK
Date Time Interrogation Session: 20200305123726
Implantable Lead Implant Date: 20200220
Implantable Lead Implant Date: 20200220
Implantable Lead Location: 753859
Implantable Lead Location: 753860
Implantable Lead Model: 3830
Implantable Lead Model: 5076
Implantable Pulse Generator Implant Date: 20200220

## 2018-03-16 MED ORDER — AMOXICILLIN 500 MG PO TABS: ORAL_TABLET | ORAL | 8 refills | Status: DC

## 2018-03-16 NOTE — Progress Notes (Signed)
Wound check appointment. Steri-strips removed. Wound without redness or edema. Incision edges approximated, wound well healed. Normal device function. Thresholds, sensing, and impedances consistent with implant measurements. Device programmed at 3.5V/auto capture programmed on for extra safety margin until 3 month visit. Histogram distribution appropriate for patient and level of activity. No mode switches or high ventricular rates noted. Patient educated about wound care, arm mobility, lifting restrictions. ROV in 3 months with implanting physician. [facility] set up today on iPAD.

## 2018-03-16 NOTE — Patient Instructions (Addendum)
Medication Instructions:  1) Your provider discussed the importance of taking an antibiotic prior to all dental visits to prevent damage to the heart valves from infection. You were given a prescription for AMOXIL 2,000 mg to take one hour prior to any dental appointment.   Labwork: TODAY: BMET, CBC  Testing/Procedures: No new orders  Follow-Up: Please keep your follow-up appointments with the Structural Heart Team!  You have been scheduled to establish with Dr. Radford Pax on 06/16/2018 at 2:40PM.

## 2018-03-17 ENCOUNTER — Encounter: Payer: Self-pay | Admitting: Thoracic Surgery (Cardiothoracic Vascular Surgery)

## 2018-03-17 ENCOUNTER — Other Ambulatory Visit: Payer: Self-pay | Admitting: *Deleted

## 2018-03-17 LAB — BASIC METABOLIC PANEL
BUN/Creatinine Ratio: 19 (ref 12–28)
BUN: 15 mg/dL (ref 8–27)
CO2: 20 mmol/L (ref 20–29)
Calcium: 8.9 mg/dL (ref 8.7–10.3)
Chloride: 101 mmol/L (ref 96–106)
Creatinine, Ser: 0.81 mg/dL (ref 0.57–1.00)
GFR calc Af Amer: 78 mL/min/{1.73_m2} (ref >59)
GFR calc non Af Amer: 68 mL/min/{1.73_m2} (ref >59)
Glucose: 106 mg/dL — ABNORMAL HIGH (ref 65–99)
Potassium: 5.5 mmol/L — ABNORMAL HIGH (ref 3.5–5.2)
Sodium: 138 mmol/L (ref 134–144)

## 2018-03-17 LAB — CBC WITH DIFFERENTIAL/PLATELET
Basophils Absolute: 0.1 10*3/uL (ref 0.0–0.2)
Basos: 1 %
EOS (ABSOLUTE): 0.8 10*3/uL — ABNORMAL HIGH (ref 0.0–0.4)
Eos: 7 %
Hematocrit: 28.2 % — ABNORMAL LOW (ref 34.0–46.6)
Hemoglobin: 8.7 g/dL — ABNORMAL LOW (ref 11.1–15.9)
Immature Grans (Abs): 0.2 10*3/uL — ABNORMAL HIGH (ref 0.0–0.1)
Immature Granulocytes: 1 %
Lymphocytes Absolute: 1.3 10*3/uL (ref 0.7–3.1)
Lymphs: 11 %
MCH: 27.1 pg (ref 26.6–33.0)
MCHC: 30.9 g/dL — ABNORMAL LOW (ref 31.5–35.7)
MCV: 88 fL (ref 79–97)
Monocytes Absolute: 0.6 10*3/uL (ref 0.1–0.9)
Monocytes: 5 %
Neutrophils Absolute: 8.8 10*3/uL — ABNORMAL HIGH (ref 1.4–7.0)
Neutrophils: 75 %
Platelets: 631 10*3/uL — ABNORMAL HIGH (ref 150–450)
RBC: 3.21 x10E6/uL — ABNORMAL LOW (ref 3.77–5.28)
RDW: 16.4 % — ABNORMAL HIGH (ref 11.7–15.4)
WBC: 11.7 10*3/uL — ABNORMAL HIGH (ref 3.4–10.8)

## 2018-03-17 NOTE — Progress Notes (Signed)
See lab results 03/16/18 for additional information.

## 2018-03-21 DIAGNOSIS — I442 Atrioventricular block, complete: Secondary | ICD-10-CM | POA: Diagnosis not present

## 2018-03-21 DIAGNOSIS — I5030 Unspecified diastolic (congestive) heart failure: Secondary | ICD-10-CM | POA: Diagnosis not present

## 2018-03-21 DIAGNOSIS — I350 Nonrheumatic aortic (valve) stenosis: Secondary | ICD-10-CM | POA: Diagnosis not present

## 2018-03-21 DIAGNOSIS — D649 Anemia, unspecified: Secondary | ICD-10-CM | POA: Diagnosis not present

## 2018-03-24 ENCOUNTER — Telehealth: Payer: Self-pay | Admitting: Physician Assistant

## 2018-03-24 ENCOUNTER — Encounter: Payer: Self-pay | Admitting: Physician Assistant

## 2018-03-24 DIAGNOSIS — I350 Nonrheumatic aortic (valve) stenosis: Secondary | ICD-10-CM | POA: Diagnosis not present

## 2018-03-24 DIAGNOSIS — D649 Anemia, unspecified: Secondary | ICD-10-CM | POA: Diagnosis not present

## 2018-03-24 DIAGNOSIS — I442 Atrioventricular block, complete: Secondary | ICD-10-CM | POA: Diagnosis not present

## 2018-03-24 DIAGNOSIS — I5030 Unspecified diastolic (congestive) heart failure: Secondary | ICD-10-CM | POA: Diagnosis not present

## 2018-03-24 NOTE — Telephone Encounter (Signed)
Stay on lasix. I can check a CBC at next apt. Thanks

## 2018-03-24 NOTE — Telephone Encounter (Signed)
From 03/16/2018 OV note:   Chronic diastolic CHF: appears euvolemic. Continue lasix 40mg  daily. BMET today

## 2018-03-24 NOTE — Telephone Encounter (Signed)
Spoke with son, Julie Mcgee, who wanted to make [place] aware pt is home from the nursing facility now.  He is asking if she should continue her Lasix.  Advised according to instructions from 03/16/2018 office visit now, she should continue 40 mg daily.  He is asking about her hemoglobin because the nursing facility told him she was close to needing a transfusion.  Their last results were from 2/29 and was 7.6.  Most recent here on 3/5 and was 8.7.  Reassurance given that this has improved and more than likely will be rechecked at her f/u visit scheduled 3/25.  Son was grateful for the call back and information.  They will keep appointment as scheduled and c/b prior to if any further questions or concerns.

## 2018-03-24 NOTE — Telephone Encounter (Signed)
Just FYI that patient is home and her blood counts are borderline and she had a transfusion in the hospital. Son wants to know if she needs to continue lasix.

## 2018-03-25 DIAGNOSIS — Z87891 Personal history of nicotine dependence: Secondary | ICD-10-CM | POA: Diagnosis not present

## 2018-03-25 DIAGNOSIS — R2681 Unsteadiness on feet: Secondary | ICD-10-CM | POA: Diagnosis not present

## 2018-03-25 DIAGNOSIS — M6281 Muscle weakness (generalized): Secondary | ICD-10-CM | POA: Diagnosis not present

## 2018-03-25 DIAGNOSIS — I69322 Dysarthria following cerebral infarction: Secondary | ICD-10-CM | POA: Diagnosis not present

## 2018-03-25 DIAGNOSIS — I5033 Acute on chronic diastolic (congestive) heart failure: Secondary | ICD-10-CM | POA: Diagnosis not present

## 2018-03-25 DIAGNOSIS — I110 Hypertensive heart disease with heart failure: Secondary | ICD-10-CM | POA: Diagnosis not present

## 2018-03-25 DIAGNOSIS — I350 Nonrheumatic aortic (valve) stenosis: Secondary | ICD-10-CM | POA: Diagnosis not present

## 2018-03-25 DIAGNOSIS — I69331 Monoplegia of upper limb following cerebral infarction affecting right dominant side: Secondary | ICD-10-CM | POA: Diagnosis not present

## 2018-03-25 DIAGNOSIS — I872 Venous insufficiency (chronic) (peripheral): Secondary | ICD-10-CM | POA: Diagnosis not present

## 2018-03-27 ENCOUNTER — Other Ambulatory Visit: Payer: Self-pay

## 2018-03-27 ENCOUNTER — Telehealth: Payer: Self-pay | Admitting: Cardiology

## 2018-03-27 MED ORDER — CLOPIDOGREL BISULFATE 75 MG PO TABS: 75.0000 mg | ORAL_TABLET | Freq: Every day | ORAL | 2 refills | Status: DC

## 2018-03-27 MED ORDER — FERROUS SULFATE 325 (65 FE) MG PO TABS: 325.0000 mg | ORAL_TABLET | Freq: Two times a day (BID) | ORAL | 2 refills | Status: DC

## 2018-03-27 NOTE — Telephone Encounter (Signed)
Incorrect department

## 2018-03-27 NOTE — Telephone Encounter (Signed)
Rn contacted [name] (pt son) to answer questions about medication refills and COVID 19 concerns for echo on 03/25.No further questions at this time.

## 2018-03-27 NOTE — Telephone Encounter (Signed)
Son wants to know if it is save for Pt to still have Echo on 03/25, or if he should reschedule given the COVID conditions. Please advise

## 2018-03-27 NOTE — Telephone Encounter (Signed)
Refill for clopedigrel sent to [facility] to cover patient until 06/16/18 new pt with Dr. Radford Pax.

## 2018-03-27 NOTE — Telephone Encounter (Signed)
°*  STAT* If patient is at the pharmacy, call can be transferred to refill team.   1. Which medications need to be refilled? (please list name of each medication and dose if known)  clopidogrel (PLAVIX) 75 MG tablet ferrous sulfate 325 (65 FE) MG tablet  2. Which pharmacy/location (including street and city if local pharmacy) is medication to be sent to? [place], [place]  3. Do they need a 30 day or 90 day supply? [place]

## 2018-03-28 DIAGNOSIS — I5033 Acute on chronic diastolic (congestive) heart failure: Secondary | ICD-10-CM | POA: Diagnosis not present

## 2018-03-28 DIAGNOSIS — I350 Nonrheumatic aortic (valve) stenosis: Secondary | ICD-10-CM | POA: Diagnosis not present

## 2018-03-28 DIAGNOSIS — I110 Hypertensive heart disease with heart failure: Secondary | ICD-10-CM | POA: Diagnosis not present

## 2018-03-28 DIAGNOSIS — I872 Venous insufficiency (chronic) (peripheral): Secondary | ICD-10-CM | POA: Diagnosis not present

## 2018-03-28 DIAGNOSIS — I69322 Dysarthria following cerebral infarction: Secondary | ICD-10-CM | POA: Diagnosis not present

## 2018-03-28 DIAGNOSIS — I69331 Monoplegia of upper limb following cerebral infarction affecting right dominant side: Secondary | ICD-10-CM | POA: Diagnosis not present

## 2018-03-29 DIAGNOSIS — I110 Hypertensive heart disease with heart failure: Secondary | ICD-10-CM | POA: Diagnosis not present

## 2018-03-29 DIAGNOSIS — I872 Venous insufficiency (chronic) (peripheral): Secondary | ICD-10-CM | POA: Diagnosis not present

## 2018-03-29 DIAGNOSIS — I69322 Dysarthria following cerebral infarction: Secondary | ICD-10-CM | POA: Diagnosis not present

## 2018-03-29 DIAGNOSIS — I350 Nonrheumatic aortic (valve) stenosis: Secondary | ICD-10-CM | POA: Diagnosis not present

## 2018-03-29 DIAGNOSIS — I5033 Acute on chronic diastolic (congestive) heart failure: Secondary | ICD-10-CM | POA: Diagnosis not present

## 2018-03-29 DIAGNOSIS — I69331 Monoplegia of upper limb following cerebral infarction affecting right dominant side: Secondary | ICD-10-CM | POA: Diagnosis not present

## 2018-03-31 DIAGNOSIS — I350 Nonrheumatic aortic (valve) stenosis: Secondary | ICD-10-CM | POA: Diagnosis not present

## 2018-03-31 DIAGNOSIS — I5033 Acute on chronic diastolic (congestive) heart failure: Secondary | ICD-10-CM | POA: Diagnosis not present

## 2018-03-31 DIAGNOSIS — I69331 Monoplegia of upper limb following cerebral infarction affecting right dominant side: Secondary | ICD-10-CM | POA: Diagnosis not present

## 2018-03-31 DIAGNOSIS — I872 Venous insufficiency (chronic) (peripheral): Secondary | ICD-10-CM | POA: Diagnosis not present

## 2018-03-31 DIAGNOSIS — I110 Hypertensive heart disease with heart failure: Secondary | ICD-10-CM | POA: Diagnosis not present

## 2018-03-31 DIAGNOSIS — I69322 Dysarthria following cerebral infarction: Secondary | ICD-10-CM | POA: Diagnosis not present

## 2018-04-03 ENCOUNTER — Other Ambulatory Visit: Payer: Self-pay

## 2018-04-03 ENCOUNTER — Other Ambulatory Visit: Payer: Self-pay | Admitting: Physician Assistant

## 2018-04-03 ENCOUNTER — Telehealth (INDEPENDENT_AMBULATORY_CARE_PROVIDER_SITE_OTHER): Payer: Medicare Other | Admitting: Physician Assistant

## 2018-04-03 DIAGNOSIS — I69331 Monoplegia of upper limb following cerebral infarction affecting right dominant side: Secondary | ICD-10-CM | POA: Diagnosis not present

## 2018-04-03 DIAGNOSIS — I5033 Acute on chronic diastolic (congestive) heart failure: Secondary | ICD-10-CM | POA: Diagnosis not present

## 2018-04-03 DIAGNOSIS — I5032 Chronic diastolic (congestive) heart failure: Secondary | ICD-10-CM

## 2018-04-03 DIAGNOSIS — Z952 Presence of prosthetic heart valve: Secondary | ICD-10-CM

## 2018-04-03 DIAGNOSIS — I110 Hypertensive heart disease with heart failure: Secondary | ICD-10-CM | POA: Diagnosis not present

## 2018-04-03 DIAGNOSIS — I69322 Dysarthria following cerebral infarction: Secondary | ICD-10-CM | POA: Diagnosis not present

## 2018-04-03 DIAGNOSIS — I10 Essential (primary) hypertension: Secondary | ICD-10-CM | POA: Diagnosis not present

## 2018-04-03 DIAGNOSIS — D649 Anemia, unspecified: Secondary | ICD-10-CM | POA: Diagnosis not present

## 2018-04-03 DIAGNOSIS — I872 Venous insufficiency (chronic) (peripheral): Secondary | ICD-10-CM | POA: Diagnosis not present

## 2018-04-03 DIAGNOSIS — Z950 Presence of cardiac pacemaker: Secondary | ICD-10-CM

## 2018-04-03 DIAGNOSIS — I350 Nonrheumatic aortic (valve) stenosis: Secondary | ICD-10-CM | POA: Diagnosis not present

## 2018-04-03 MED ORDER — CLOPIDOGREL BISULFATE 75 MG PO TABS: 75.0000 mg | ORAL_TABLET | Freq: Every day | ORAL | 0 refills | Status: DC

## 2018-04-03 MED ORDER — FUROSEMIDE 20 MG PO TABS: 20.0000 mg | ORAL_TABLET | Freq: Every day | ORAL | 3 refills | Status: DC

## 2018-04-03 NOTE — Progress Notes (Signed)
Echo order 1 year s/p TAVR placed

## 2018-04-03 NOTE — Progress Notes (Signed)
HEART AND VASCULAR CENTER   MULTIDISCIPLINARY HEART VALVE TEAM   Evaluation Performed:  Follow-up visit  This visit type was conducted due to national recommendations for restrictions regarding the COVID-19 Pandemic (e.g. social distancing).  This format is felt to be most appropriate for this patient at this time.  All issues noted in this document were discussed and addressed.  No physical exam was performed (except for noted visual exam findings with Telehealth visits).  Patient consented to today's televisit and understands her insurance will be billed.   Date:  04/03/2018   ID:  Julie Mcgee, DOB Sep 27, 1933, MRN [id number]  Patient Location:  [address] [place] [address] [place] [address]   Provider location:   [address] [place], [place] [address]  PCP:  Lyman Bishop, DO  Cardiologist:  Dr. Radford Pax (pt requested) / Dr. Burt Knack & Dr. Roxy Manns (TAVR) Electrophysiologist:  Dr. Lovena Le   Chief Complaint:  1 month s/p TAVR   History of Present Illness:    Julie Mcgee is a 85 y.o. female with a history of obesity, HTN, HLD, RBBB, previousCVAwith residual R hemiparesis,carotid artery disease, moderate mitral stenosis, chronic diastolic CHF with recent admission andsevere ASs/p TAVR (02/28/18) c/b CHB requiring PPM placement who presents via audio conferencing for a telehealth visit today.    The patient does not symptoms concerning for COVID-19 infection (fever, chills, cough, or new SHORTNESS OF BREATH).   No CP. Still has shortness of breath with mild to moderate activity but improved since TAVR. No LE edema, orthopnea or PND. No dizziness or syncope. No blood in stool or urine. No palpitations. She just returned home from [place] where she was working with rehab. She is doing a lot better. She did run out of lasix and plavix. I also spoke with her son, Julie Mcgee, over the phone. He thinks she is more short of breath than he would like, but he knows that she is reluctant to loose  weight and work with PT consistently.    Prior CV studies:   The following studies were reviewed today: TAVR OPERATIVE NOTE   Date of Procedure:02/28/2018  Preoperative Diagnosis:Severe Aortic Stenosis   Postoperative Diagnosis:Same   Procedure:   Transcatheter Aortic Valve Replacement - PercutaneousRightTransfemoral Approach Edwards Sapien 3 THV (size 80mm, model # 9600TFX, serial # [id number])  Co-Surgeons:Clarence H. Roxy Manns, MD and Sherren Mocha, MD  Anesthesiologist:William Therisa Doyne, MD  Echocardiographer:Peter Johnsie Cancel, MD  Pre-operative Echo Findings: ? Severe aortic stenosis ? Mild aortic insufficiency ? Moderate mitral stenosis ? Normalleft ventricular systolic function  Post-operative Echo Findings: ? Noparavalvular leak ? Normalleft ventricular systolic function   __________________   Echo 03/01/18: IMPRESSIONS 1. The left ventricle has normal systolic function, with an ejection fraction of 60-65%. The cavity size was normal. There is moderately increased left ventricular wall thickness. 2. Left atrial size was severely dilated. 3. Mildly thickened tricuspid valve leaflets. 4. The mitral valve is rheumatic. Severe thickening of the mitral valve leaflet. Severe calcification of the mitral valve leaflet. There is severe mitral annular calcification present. Moderate mitral valve stenosis. 5. The tricuspid valve was normal in structure. Tricuspid valve regurgitation is moderate. 6. Post TAVR with 23 mm Sapien 3 valve Not well visualized no PVL gradients have increased considerably since implant 02/28/18.  __________________  03/02/18 PACEMAKER IMPLANT   CONCLUSIONS:  1. Successful implantation of a Medtronic dual-chamber pacemaker for symptomatic bradycardia due to complete heart block 2. No early apparent complications.      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   23 mm Edwards Sapien 3 transcatheter heart valve placed via percutaneous right transfemoral approach    Severe aortic stenosis    Past Surgical History:  Procedure Laterality Date   BASAL CELL CARCINOMA EXCISION  2018   CARDIAC CATHETERIZATION     COLONOSCOPY  2001   HIP SURGERY  2005   [place]   PACEMAKER IMPLANT N/A 03/02/2018   Procedure: PACEMAKER IMPLANT;  Surgeon: Evans Lance, MD;  Location: [place] [facility];  Service: Cardiovascular;  Laterality: N/A;   RIGHT/LEFT HEART CATH AND CORONARY ANGIOGRAPHY N/A 02/21/2018   Procedure: RIGHT/LEFT HEART CATH AND CORONARY ANGIOGRAPHY;  Surgeon: Sherren Mocha, MD;  Location: [place] [facility];  Service: Cardiovascular;  Laterality: N/A;   TEE WITHOUT CARDIOVERSION N/A 02/28/2018   Procedure: TRANSESOPHAGEAL ECHOCARDIOGRAM (TEE);  Surgeon: Sherren Mocha, MD;  Location: [place];  Service: Open Heart Surgery;  Laterality: N/A;   TEMPORARY PACEMAKER N/A 03/01/2018   Procedure: TEMPORARY PACEMAKER;  Surgeon: Burnell Blanks, MD;  Location: [place] [facility];  Service: Cardiovascular;  Laterality: N/A;   TONSILLECTOMY     TRANSCATHETER AORTIC VALVE REPLACEMENT, TRANSFEMORAL N/A 02/28/2018   Procedure: TRANSCATHETER AORTIC VALVE REPLACEMENT, TRANSFEMORAL;  Surgeon: Sherren Mocha, MD;  Location: [place] [facility];  Service: Open Heart Surgery;  Laterality: N/A;     No outpatient medications have been marked as taking for the 04/03/18 encounter (Appointment) with  Eileen Stanford, PA-C.     Allergies:   Adhesive [tape]   Social History   Tobacco Use   Smoking status: Former Smoker    Packs/day: 1.00    Years: 45.00    Pack years: 45.00    Types: Cigarettes    Last attempt to quit: 05/23/2009    Years since quitting: 8.8   Smokeless tobacco: Never Used  Substance Use Topics   Alcohol use: Not Currently   Drug use: Never     Family Hx: The patient's family history includes Alcohol abuse in her father; Chronic Renal Failure in her mother; Heart failure in her mother.  ROS:   Please see the history of present illness.    All other systems reviewed and are negative.   Labs/Other Tests and Data Reviewed:    Recent Labs: 02/18/2018: TSH 3.355 02/27/2018: ALT 12; B Natriuretic Peptide 1,020.2 03/01/2018: Magnesium 2.1 03/16/2018: BUN 15; Creatinine, Ser 0.81; Hemoglobin 8.7; Platelets 631; Potassium 5.5; Sodium 138   Recent Lipid Panel No results found for: CHOL, TRIG, HDL, CHOLHDL, LDLCALC, LDLDIRECT  Wt Readings from Last 3 Encounters:  03/16/18 196 lb 12.8 oz (89.3 kg)  03/06/18 218 lb 14.7 oz (99.3 kg)  02/27/18 (P) 180 lb (81.6 kg)     Exam:    Not completed as visit conducted over the phone  ASSESSMENT & PLAN:    Severe AS s/p TAVR:back home from SNF. She has had improvement in her breathing but still has some shortness of breath with NYHA class II symptoms. I think her obesity and physical  deconditioning are also playing a role. SBE prophylaxis discussed; I have RX'd amoxicillin. Echo has been rescheduled to May given Covid 19 pandemic. She ran out out of plavix. I have called in a Rx. Plan to continue aspirin and plavix x6 months and discontinue plavix (08/2018).  CHB s/p PPM: she will see Dr. Lovena Le back in May on the same day as her echo.  Anemia: Hg improved to 8.7 on 03/16/18. Continue iron   Chronic diastolic CHF: appears euvolemic. Ran out of lasix 40mg  daily. I have asked her to resume lasix 20mg  daily    HTN: continue current regimen    COVID-19 Education: The signs and symptoms of COVID-19 were discussed with the patient and how to seek care for testing (follow up with PCP or arrange E-visit).  The importance of social distancing was discussed today.  Patient Risk:   After full review of this patients clinical status, I feel that they are at least moderate risk at this time.  Time:   Today, I have spent 21 minutes with the patient with telehealth technology discussing her symptoms with her and her son over the phone.     Medication Adjustments/Labs and Tests Ordered: Current medicines are reviewed at length with the patient today.  Concerns regarding medicines are outlined above.  Tests Ordered: Echo  Medication Changes: Dear Mrs. Jared,  It was so nice to talk to you over the phone. I am so glad you are doing so well. I just wanted to send you a recap of our discussion. I have called in Plavix and Lasix into your pharmacy. Please take them as soon as you receive them.  Continue on Aspirin and plavix x 6 months after your TAVR. STOP Plavix on 08/29/18 and continue on Aspirin 81mg  daily indefinitely.  I have rescheduled your 1 month echo 06/06/2018. You have an appointment with Dr. Radford Pax 06/16/2018. 1 year echo/appt with me have been arranged. Details on appt times and locations are attached to this letter.  Please call us with any  questions or concerns. Please stay safe during these uncertain times.  Nell Range, [place] [phone]  Disposition:  in 3 month(s)  Signed, Angelena Form, PA-C  04/03/2018 2:08 PM    [facility] [place], [place], [place]  [address] Phone: [phone]; Fax: [phone]

## 2018-04-05 ENCOUNTER — Ambulatory Visit: Payer: Medicare Other | Admitting: Physician Assistant

## 2018-04-05 ENCOUNTER — Other Ambulatory Visit (HOSPITAL_COMMUNITY): Payer: Medicare Other

## 2018-04-10 DIAGNOSIS — I69331 Monoplegia of upper limb following cerebral infarction affecting right dominant side: Secondary | ICD-10-CM | POA: Diagnosis not present

## 2018-04-10 DIAGNOSIS — I5033 Acute on chronic diastolic (congestive) heart failure: Secondary | ICD-10-CM | POA: Diagnosis not present

## 2018-04-10 DIAGNOSIS — I69322 Dysarthria following cerebral infarction: Secondary | ICD-10-CM | POA: Diagnosis not present

## 2018-04-10 DIAGNOSIS — I872 Venous insufficiency (chronic) (peripheral): Secondary | ICD-10-CM | POA: Diagnosis not present

## 2018-04-10 DIAGNOSIS — I110 Hypertensive heart disease with heart failure: Secondary | ICD-10-CM | POA: Diagnosis not present

## 2018-04-10 DIAGNOSIS — I350 Nonrheumatic aortic (valve) stenosis: Secondary | ICD-10-CM | POA: Diagnosis not present

## 2018-04-14 ENCOUNTER — Other Ambulatory Visit: Payer: Self-pay | Admitting: Physician Assistant

## 2018-04-14 MED ORDER — CLOPIDOGREL BISULFATE 75 MG PO TABS: 75.0000 mg | ORAL_TABLET | Freq: Every day | ORAL | 1 refills | Status: DC

## 2018-04-19 DIAGNOSIS — I69331 Monoplegia of upper limb following cerebral infarction affecting right dominant side: Secondary | ICD-10-CM | POA: Diagnosis not present

## 2018-04-19 DIAGNOSIS — I110 Hypertensive heart disease with heart failure: Secondary | ICD-10-CM | POA: Diagnosis not present

## 2018-04-19 DIAGNOSIS — I350 Nonrheumatic aortic (valve) stenosis: Secondary | ICD-10-CM | POA: Diagnosis not present

## 2018-04-19 DIAGNOSIS — I872 Venous insufficiency (chronic) (peripheral): Secondary | ICD-10-CM | POA: Diagnosis not present

## 2018-04-19 DIAGNOSIS — I5033 Acute on chronic diastolic (congestive) heart failure: Secondary | ICD-10-CM | POA: Diagnosis not present

## 2018-04-19 DIAGNOSIS — I69322 Dysarthria following cerebral infarction: Secondary | ICD-10-CM | POA: Diagnosis not present

## 2018-04-20 DIAGNOSIS — R300 Dysuria: Secondary | ICD-10-CM | POA: Diagnosis not present

## 2018-04-21 DIAGNOSIS — I350 Nonrheumatic aortic (valve) stenosis: Secondary | ICD-10-CM | POA: Diagnosis not present

## 2018-04-21 DIAGNOSIS — I69331 Monoplegia of upper limb following cerebral infarction affecting right dominant side: Secondary | ICD-10-CM | POA: Diagnosis not present

## 2018-04-21 DIAGNOSIS — I872 Venous insufficiency (chronic) (peripheral): Secondary | ICD-10-CM | POA: Diagnosis not present

## 2018-04-21 DIAGNOSIS — I69322 Dysarthria following cerebral infarction: Secondary | ICD-10-CM | POA: Diagnosis not present

## 2018-04-21 DIAGNOSIS — I5033 Acute on chronic diastolic (congestive) heart failure: Secondary | ICD-10-CM | POA: Diagnosis not present

## 2018-04-21 DIAGNOSIS — I110 Hypertensive heart disease with heart failure: Secondary | ICD-10-CM | POA: Diagnosis not present

## 2018-04-24 DIAGNOSIS — N390 Urinary tract infection, site not specified: Secondary | ICD-10-CM | POA: Diagnosis not present

## 2018-04-24 DIAGNOSIS — R2681 Unsteadiness on feet: Secondary | ICD-10-CM | POA: Diagnosis not present

## 2018-04-24 DIAGNOSIS — I5033 Acute on chronic diastolic (congestive) heart failure: Secondary | ICD-10-CM | POA: Diagnosis not present

## 2018-04-24 DIAGNOSIS — Z87891 Personal history of nicotine dependence: Secondary | ICD-10-CM | POA: Diagnosis not present

## 2018-04-24 DIAGNOSIS — I69322 Dysarthria following cerebral infarction: Secondary | ICD-10-CM | POA: Diagnosis not present

## 2018-04-24 DIAGNOSIS — I110 Hypertensive heart disease with heart failure: Secondary | ICD-10-CM | POA: Diagnosis not present

## 2018-04-24 DIAGNOSIS — I350 Nonrheumatic aortic (valve) stenosis: Secondary | ICD-10-CM | POA: Diagnosis not present

## 2018-04-24 DIAGNOSIS — Z950 Presence of cardiac pacemaker: Secondary | ICD-10-CM | POA: Diagnosis not present

## 2018-04-24 DIAGNOSIS — I872 Venous insufficiency (chronic) (peripheral): Secondary | ICD-10-CM | POA: Diagnosis not present

## 2018-04-24 DIAGNOSIS — I69331 Monoplegia of upper limb following cerebral infarction affecting right dominant side: Secondary | ICD-10-CM | POA: Diagnosis not present

## 2018-04-25 DIAGNOSIS — N390 Urinary tract infection, site not specified: Secondary | ICD-10-CM | POA: Diagnosis not present

## 2018-04-25 DIAGNOSIS — I69322 Dysarthria following cerebral infarction: Secondary | ICD-10-CM | POA: Diagnosis not present

## 2018-04-25 DIAGNOSIS — I350 Nonrheumatic aortic (valve) stenosis: Secondary | ICD-10-CM | POA: Diagnosis not present

## 2018-04-25 DIAGNOSIS — I110 Hypertensive heart disease with heart failure: Secondary | ICD-10-CM | POA: Diagnosis not present

## 2018-04-25 DIAGNOSIS — I5033 Acute on chronic diastolic (congestive) heart failure: Secondary | ICD-10-CM | POA: Diagnosis not present

## 2018-04-25 DIAGNOSIS — I69331 Monoplegia of upper limb following cerebral infarction affecting right dominant side: Secondary | ICD-10-CM | POA: Diagnosis not present

## 2018-04-26 DIAGNOSIS — I69331 Monoplegia of upper limb following cerebral infarction affecting right dominant side: Secondary | ICD-10-CM | POA: Diagnosis not present

## 2018-04-26 DIAGNOSIS — I5033 Acute on chronic diastolic (congestive) heart failure: Secondary | ICD-10-CM | POA: Diagnosis not present

## 2018-04-26 DIAGNOSIS — I350 Nonrheumatic aortic (valve) stenosis: Secondary | ICD-10-CM | POA: Diagnosis not present

## 2018-04-26 DIAGNOSIS — I69322 Dysarthria following cerebral infarction: Secondary | ICD-10-CM | POA: Diagnosis not present

## 2018-04-26 DIAGNOSIS — N390 Urinary tract infection, site not specified: Secondary | ICD-10-CM | POA: Diagnosis not present

## 2018-04-26 DIAGNOSIS — I110 Hypertensive heart disease with heart failure: Secondary | ICD-10-CM | POA: Diagnosis not present

## 2018-05-03 ENCOUNTER — Telehealth: Payer: Self-pay | Admitting: Physician Assistant

## 2018-05-03 DIAGNOSIS — I69331 Monoplegia of upper limb following cerebral infarction affecting right dominant side: Secondary | ICD-10-CM | POA: Diagnosis not present

## 2018-05-03 DIAGNOSIS — I5033 Acute on chronic diastolic (congestive) heart failure: Secondary | ICD-10-CM | POA: Diagnosis not present

## 2018-05-03 DIAGNOSIS — I110 Hypertensive heart disease with heart failure: Secondary | ICD-10-CM | POA: Diagnosis not present

## 2018-05-03 DIAGNOSIS — N390 Urinary tract infection, site not specified: Secondary | ICD-10-CM | POA: Diagnosis not present

## 2018-05-03 DIAGNOSIS — I69322 Dysarthria following cerebral infarction: Secondary | ICD-10-CM | POA: Diagnosis not present

## 2018-05-03 DIAGNOSIS — I350 Nonrheumatic aortic (valve) stenosis: Secondary | ICD-10-CM | POA: Diagnosis not present

## 2018-05-03 NOTE — Telephone Encounter (Signed)
New Message    Pts son would like to discuss Lasix   Please call back

## 2018-05-03 NOTE — Telephone Encounter (Signed)
Spoke with the patient's son, who states he does not think his mom is taking her Lasix at all. He states she says she'll "so what she wants." He requests a call made to his mother to discuss the medication and to reiterate to her the importance of taking it.  Attempted to call the patient to discuss meds and to reschedule echo due to COVID-19. Left message to call back.

## 2018-05-04 DIAGNOSIS — N390 Urinary tract infection, site not specified: Secondary | ICD-10-CM | POA: Diagnosis not present

## 2018-05-04 DIAGNOSIS — I69322 Dysarthria following cerebral infarction: Secondary | ICD-10-CM | POA: Diagnosis not present

## 2018-05-04 DIAGNOSIS — I69331 Monoplegia of upper limb following cerebral infarction affecting right dominant side: Secondary | ICD-10-CM | POA: Diagnosis not present

## 2018-05-04 DIAGNOSIS — I350 Nonrheumatic aortic (valve) stenosis: Secondary | ICD-10-CM | POA: Diagnosis not present

## 2018-05-04 DIAGNOSIS — I5033 Acute on chronic diastolic (congestive) heart failure: Secondary | ICD-10-CM | POA: Diagnosis not present

## 2018-05-04 DIAGNOSIS — I110 Hypertensive heart disease with heart failure: Secondary | ICD-10-CM | POA: Diagnosis not present

## 2018-05-05 DIAGNOSIS — I69331 Monoplegia of upper limb following cerebral infarction affecting right dominant side: Secondary | ICD-10-CM | POA: Diagnosis not present

## 2018-05-05 DIAGNOSIS — I350 Nonrheumatic aortic (valve) stenosis: Secondary | ICD-10-CM | POA: Diagnosis not present

## 2018-05-05 DIAGNOSIS — I5033 Acute on chronic diastolic (congestive) heart failure: Secondary | ICD-10-CM | POA: Diagnosis not present

## 2018-05-05 DIAGNOSIS — I110 Hypertensive heart disease with heart failure: Secondary | ICD-10-CM | POA: Diagnosis not present

## 2018-05-05 DIAGNOSIS — N390 Urinary tract infection, site not specified: Secondary | ICD-10-CM | POA: Diagnosis not present

## 2018-05-05 DIAGNOSIS — I69322 Dysarthria following cerebral infarction: Secondary | ICD-10-CM | POA: Diagnosis not present

## 2018-05-08 DIAGNOSIS — I110 Hypertensive heart disease with heart failure: Secondary | ICD-10-CM | POA: Diagnosis not present

## 2018-05-08 DIAGNOSIS — N390 Urinary tract infection, site not specified: Secondary | ICD-10-CM | POA: Diagnosis not present

## 2018-05-08 DIAGNOSIS — I350 Nonrheumatic aortic (valve) stenosis: Secondary | ICD-10-CM | POA: Diagnosis not present

## 2018-05-08 DIAGNOSIS — I5033 Acute on chronic diastolic (congestive) heart failure: Secondary | ICD-10-CM | POA: Diagnosis not present

## 2018-05-08 DIAGNOSIS — I69331 Monoplegia of upper limb following cerebral infarction affecting right dominant side: Secondary | ICD-10-CM | POA: Diagnosis not present

## 2018-05-08 DIAGNOSIS — I69322 Dysarthria following cerebral infarction: Secondary | ICD-10-CM | POA: Diagnosis not present

## 2018-05-10 DIAGNOSIS — I69331 Monoplegia of upper limb following cerebral infarction affecting right dominant side: Secondary | ICD-10-CM | POA: Diagnosis not present

## 2018-05-10 DIAGNOSIS — I350 Nonrheumatic aortic (valve) stenosis: Secondary | ICD-10-CM | POA: Diagnosis not present

## 2018-05-10 DIAGNOSIS — N390 Urinary tract infection, site not specified: Secondary | ICD-10-CM | POA: Diagnosis not present

## 2018-05-10 DIAGNOSIS — I69322 Dysarthria following cerebral infarction: Secondary | ICD-10-CM | POA: Diagnosis not present

## 2018-05-10 DIAGNOSIS — I5033 Acute on chronic diastolic (congestive) heart failure: Secondary | ICD-10-CM | POA: Diagnosis not present

## 2018-05-10 DIAGNOSIS — I110 Hypertensive heart disease with heart failure: Secondary | ICD-10-CM | POA: Diagnosis not present

## 2018-05-10 NOTE — Telephone Encounter (Signed)
Her metoprolol would be more likely to be causing symptoms than her other meds - there is a 2-5% reported incidence of diarrhea with metoprolol. This does not look to be a new medication though, so her symptoms may not be medication-related.

## 2018-05-10 NOTE — Telephone Encounter (Signed)
Spoke with the patient. She states she feels fine and is taking her medications as directed (including Lasix). She denies cardiac symptoms, but states she has had intermittent loose bowel movements for almost 3 weeks and thinks it may be medication related.   Informed her a pharmacist will review her medication list and she will be called back with recommendations.

## 2018-05-10 NOTE — Telephone Encounter (Signed)
Reviewed pharmD's notes with Ms. Romanek, and she agrees that her symptoms are most likely not related to medicine.  She will call her PCP to review symptoms. She was grateful for assistance.

## 2018-05-12 ENCOUNTER — Telehealth: Payer: Self-pay | Admitting: Cardiology

## 2018-05-12 DIAGNOSIS — I350 Nonrheumatic aortic (valve) stenosis: Secondary | ICD-10-CM | POA: Diagnosis not present

## 2018-05-12 DIAGNOSIS — I69331 Monoplegia of upper limb following cerebral infarction affecting right dominant side: Secondary | ICD-10-CM | POA: Diagnosis not present

## 2018-05-12 DIAGNOSIS — I110 Hypertensive heart disease with heart failure: Secondary | ICD-10-CM | POA: Diagnosis not present

## 2018-05-12 DIAGNOSIS — I5033 Acute on chronic diastolic (congestive) heart failure: Secondary | ICD-10-CM | POA: Diagnosis not present

## 2018-05-12 DIAGNOSIS — N390 Urinary tract infection, site not specified: Secondary | ICD-10-CM | POA: Diagnosis not present

## 2018-05-12 DIAGNOSIS — I69322 Dysarthria following cerebral infarction: Secondary | ICD-10-CM | POA: Diagnosis not present

## 2018-05-12 NOTE — Telephone Encounter (Signed)
Called and spoke with patient son [name] and advised that he needed to take his mother to the ER for labs and possible work up. He is concerned with her being so immune compromised. I advised if he could try to take her to [facility] and have an evaluation. He asked why she would need the lab and a possible work up. I advised that with blood loss she would need to check her Hgb to see if she may need to be given blood at the same time see if her anticoagulants needs to be stopped and possibly see a GI specialist to look for the location of blood loss. I did let him know the importance of evaluating blood loss and if she has lost too much blood. He express understand and would figure out where he would take her.

## 2018-05-12 NOTE — Telephone Encounter (Signed)
Please call pt son [name] at [phone]

## 2018-05-12 NOTE — Telephone Encounter (Signed)
Telephone call from [place] patient son. States patient IS SOB and having diarrhea. Will speak with DOD and call back.

## 2018-05-12 NOTE — Telephone Encounter (Signed)
Needs to go to ER for CBC ans assessment

## 2018-05-12 NOTE — Telephone Encounter (Signed)
Telephoned patient who states she has been having "blood in my stool for about a week". Pt unable to desrcibe whether bright red or tarry stools.Pt states is taking her xarelto as prescribed.Denies SOB,dizziness,CP or swelling. Will consult with Dr Geraldo Pitter.

## 2018-05-12 NOTE — Telephone Encounter (Signed)
Mo, a Valeria with [facility] was visiting the patient today, and the patient noted that she was having incontinence and some old blood in her stool. The [facility] thinks it may be due to the patient being on Xarelto. The [facility] contacted the PCP, and the PCP advised the [facility] to call the patient;s Cardiologist.

## 2018-05-15 DIAGNOSIS — N390 Urinary tract infection, site not specified: Secondary | ICD-10-CM | POA: Diagnosis not present

## 2018-05-15 DIAGNOSIS — I110 Hypertensive heart disease with heart failure: Secondary | ICD-10-CM | POA: Diagnosis not present

## 2018-05-15 DIAGNOSIS — I5033 Acute on chronic diastolic (congestive) heart failure: Secondary | ICD-10-CM | POA: Diagnosis not present

## 2018-05-15 DIAGNOSIS — I69331 Monoplegia of upper limb following cerebral infarction affecting right dominant side: Secondary | ICD-10-CM | POA: Diagnosis not present

## 2018-05-15 DIAGNOSIS — I69322 Dysarthria following cerebral infarction: Secondary | ICD-10-CM | POA: Diagnosis not present

## 2018-05-15 DIAGNOSIS — I350 Nonrheumatic aortic (valve) stenosis: Secondary | ICD-10-CM | POA: Diagnosis not present

## 2018-05-16 DIAGNOSIS — I69322 Dysarthria following cerebral infarction: Secondary | ICD-10-CM | POA: Diagnosis not present

## 2018-05-16 DIAGNOSIS — I69331 Monoplegia of upper limb following cerebral infarction affecting right dominant side: Secondary | ICD-10-CM | POA: Diagnosis not present

## 2018-05-16 DIAGNOSIS — I5033 Acute on chronic diastolic (congestive) heart failure: Secondary | ICD-10-CM | POA: Diagnosis not present

## 2018-05-16 DIAGNOSIS — N390 Urinary tract infection, site not specified: Secondary | ICD-10-CM | POA: Diagnosis not present

## 2018-05-16 DIAGNOSIS — I110 Hypertensive heart disease with heart failure: Secondary | ICD-10-CM | POA: Diagnosis not present

## 2018-05-16 DIAGNOSIS — K921 Melena: Secondary | ICD-10-CM | POA: Diagnosis not present

## 2018-05-16 DIAGNOSIS — I350 Nonrheumatic aortic (valve) stenosis: Secondary | ICD-10-CM | POA: Diagnosis not present

## 2018-05-17 ENCOUNTER — Inpatient Hospital Stay (HOSPITAL_COMMUNITY)
Admission: EM | Admit: 2018-05-17 | Discharge: 2018-05-28 | DRG: 329 | Disposition: A | Discharge status: Home Health | Payer: Medicare Other | Attending: Family Medicine | Admitting: Family Medicine

## 2018-05-17 ENCOUNTER — Encounter (HOSPITAL_COMMUNITY): Payer: Self-pay | Admitting: *Deleted

## 2018-05-17 ENCOUNTER — Other Ambulatory Visit: Payer: Self-pay

## 2018-05-17 DIAGNOSIS — E039 Hypothyroidism, unspecified: Secondary | ICD-10-CM | POA: Diagnosis present

## 2018-05-17 DIAGNOSIS — E669 Obesity, unspecified: Secondary | ICD-10-CM | POA: Diagnosis present

## 2018-05-17 DIAGNOSIS — I5033 Acute on chronic diastolic (congestive) heart failure: Secondary | ICD-10-CM | POA: Diagnosis present

## 2018-05-17 DIAGNOSIS — Z79890 Hormone replacement therapy (postmenopausal): Secondary | ICD-10-CM

## 2018-05-17 DIAGNOSIS — D649 Anemia, unspecified: Secondary | ICD-10-CM | POA: Insufficient documentation

## 2018-05-17 DIAGNOSIS — K449 Diaphragmatic hernia without obstruction or gangrene: Secondary | ICD-10-CM | POA: Diagnosis not present

## 2018-05-17 DIAGNOSIS — K6389 Other specified diseases of intestine: Secondary | ICD-10-CM

## 2018-05-17 DIAGNOSIS — K317 Polyp of stomach and duodenum: Secondary | ICD-10-CM | POA: Diagnosis not present

## 2018-05-17 DIAGNOSIS — Z01818 Encounter for other preprocedural examination: Secondary | ICD-10-CM

## 2018-05-17 DIAGNOSIS — K635 Polyp of colon: Secondary | ICD-10-CM | POA: Diagnosis not present

## 2018-05-17 DIAGNOSIS — K921 Melena: Secondary | ICD-10-CM | POA: Diagnosis not present

## 2018-05-17 DIAGNOSIS — Z7982 Long term (current) use of aspirin: Secondary | ICD-10-CM | POA: Diagnosis not present

## 2018-05-17 DIAGNOSIS — Z952 Presence of prosthetic heart valve: Secondary | ICD-10-CM

## 2018-05-17 DIAGNOSIS — Z87891 Personal history of nicotine dependence: Secondary | ICD-10-CM | POA: Diagnosis not present

## 2018-05-17 DIAGNOSIS — Z953 Presence of xenogenic heart valve: Secondary | ICD-10-CM

## 2018-05-17 DIAGNOSIS — Z7902 Long term (current) use of antithrombotics/antiplatelets: Secondary | ICD-10-CM | POA: Diagnosis not present

## 2018-05-17 DIAGNOSIS — I083 Combined rheumatic disorders of mitral, aortic and tricuspid valves: Secondary | ICD-10-CM | POA: Diagnosis present

## 2018-05-17 DIAGNOSIS — D696 Thrombocytopenia, unspecified: Secondary | ICD-10-CM | POA: Diagnosis present

## 2018-05-17 DIAGNOSIS — I371 Nonrheumatic pulmonary valve insufficiency: Secondary | ICD-10-CM | POA: Diagnosis not present

## 2018-05-17 DIAGNOSIS — I69322 Dysarthria following cerebral infarction: Secondary | ICD-10-CM | POA: Diagnosis not present

## 2018-05-17 DIAGNOSIS — Z6837 Body mass index (BMI) 37.0-37.9, adult: Secondary | ICD-10-CM | POA: Diagnosis not present

## 2018-05-17 DIAGNOSIS — Z950 Presence of cardiac pacemaker: Secondary | ICD-10-CM

## 2018-05-17 DIAGNOSIS — K621 Rectal polyp: Secondary | ICD-10-CM | POA: Diagnosis not present

## 2018-05-17 DIAGNOSIS — E785 Hyperlipidemia, unspecified: Secondary | ICD-10-CM | POA: Diagnosis present

## 2018-05-17 DIAGNOSIS — D490 Neoplasm of unspecified behavior of digestive system: Secondary | ICD-10-CM | POA: Diagnosis not present

## 2018-05-17 DIAGNOSIS — J90 Pleural effusion, not elsewhere classified: Secondary | ICD-10-CM | POA: Diagnosis not present

## 2018-05-17 DIAGNOSIS — I350 Nonrheumatic aortic (valve) stenosis: Secondary | ICD-10-CM | POA: Diagnosis not present

## 2018-05-17 DIAGNOSIS — D62 Acute posthemorrhagic anemia: Secondary | ICD-10-CM | POA: Diagnosis present

## 2018-05-17 DIAGNOSIS — I110 Hypertensive heart disease with heart failure: Secondary | ICD-10-CM | POA: Diagnosis not present

## 2018-05-17 DIAGNOSIS — I69331 Monoplegia of upper limb following cerebral infarction affecting right dominant side: Secondary | ICD-10-CM | POA: Diagnosis not present

## 2018-05-17 DIAGNOSIS — C182 Malignant neoplasm of ascending colon: Secondary | ICD-10-CM | POA: Diagnosis present

## 2018-05-17 DIAGNOSIS — Z79899 Other long term (current) drug therapy: Secondary | ICD-10-CM

## 2018-05-17 DIAGNOSIS — I69351 Hemiplegia and hemiparesis following cerebral infarction affecting right dominant side: Secondary | ICD-10-CM | POA: Diagnosis not present

## 2018-05-17 DIAGNOSIS — Z03818 Encounter for observation for suspected exposure to other biological agents ruled out: Secondary | ICD-10-CM | POA: Diagnosis not present

## 2018-05-17 DIAGNOSIS — Z1159 Encounter for screening for other viral diseases: Secondary | ICD-10-CM | POA: Diagnosis not present

## 2018-05-17 DIAGNOSIS — Z7901 Long term (current) use of anticoagulants: Secondary | ICD-10-CM

## 2018-05-17 DIAGNOSIS — Z85828 Personal history of other malignant neoplasm of skin: Secondary | ICD-10-CM

## 2018-05-17 DIAGNOSIS — I872 Venous insufficiency (chronic) (peripheral): Secondary | ICD-10-CM | POA: Diagnosis not present

## 2018-05-17 DIAGNOSIS — N63 Unspecified lump in unspecified breast: Secondary | ICD-10-CM | POA: Diagnosis not present

## 2018-05-17 DIAGNOSIS — N6310 Unspecified lump in the right breast, unspecified quadrant: Secondary | ICD-10-CM | POA: Diagnosis present

## 2018-05-17 DIAGNOSIS — K922 Gastrointestinal hemorrhage, unspecified: Secondary | ICD-10-CM | POA: Diagnosis not present

## 2018-05-17 DIAGNOSIS — I361 Nonrheumatic tricuspid (valve) insufficiency: Secondary | ICD-10-CM | POA: Diagnosis not present

## 2018-05-17 DIAGNOSIS — K648 Other hemorrhoids: Secondary | ICD-10-CM | POA: Diagnosis present

## 2018-05-17 DIAGNOSIS — D72829 Elevated white blood cell count, unspecified: Secondary | ICD-10-CM | POA: Diagnosis present

## 2018-05-17 DIAGNOSIS — R2681 Unsteadiness on feet: Secondary | ICD-10-CM | POA: Diagnosis not present

## 2018-05-17 DIAGNOSIS — I951 Orthostatic hypotension: Secondary | ICD-10-CM | POA: Diagnosis not present

## 2018-05-17 DIAGNOSIS — N390 Urinary tract infection, site not specified: Secondary | ICD-10-CM | POA: Diagnosis not present

## 2018-05-17 DIAGNOSIS — I2510 Atherosclerotic heart disease of native coronary artery without angina pectoris: Secondary | ICD-10-CM | POA: Diagnosis present

## 2018-05-17 DIAGNOSIS — I739 Peripheral vascular disease, unspecified: Secondary | ICD-10-CM | POA: Diagnosis present

## 2018-05-17 LAB — CBC WITH DIFFERENTIAL/PLATELET
Abs Immature Granulocytes: 0.03 10*3/uL (ref 0.00–0.07)
Basophils Absolute: 0.1 10*3/uL (ref 0.0–0.1)
Basophils Relative: 1 %
Eosinophils Absolute: 0.2 10*3/uL (ref 0.0–0.5)
Eosinophils Relative: 2 %
HCT: 21.1 % — ABNORMAL LOW (ref 36.0–46.0)
Hemoglobin: 5.8 g/dL — CL (ref 12.0–15.0)
Immature Granulocytes: 0 %
Lymphocytes Relative: 12 %
Lymphs Abs: 1.1 10*3/uL (ref 0.7–4.0)
MCH: 25.6 pg — ABNORMAL LOW (ref 26.0–34.0)
MCHC: 27.5 g/dL — ABNORMAL LOW (ref 30.0–36.0)
MCV: 93 fL (ref 80.0–100.0)
Monocytes Absolute: 0.7 10*3/uL (ref 0.1–1.0)
Monocytes Relative: 8 %
Neutro Abs: 7.1 10*3/uL (ref 1.7–7.7)
Neutrophils Relative %: 77 %
Platelets: 396 10*3/uL (ref 150–400)
RBC: 2.27 MIL/uL — ABNORMAL LOW (ref 3.87–5.11)
RDW: 18.6 % — ABNORMAL HIGH (ref 11.5–15.5)
WBC: 9.2 10*3/uL (ref 4.0–10.5)
nRBC: 0 % (ref 0.0–0.2)

## 2018-05-17 LAB — COMPREHENSIVE METABOLIC PANEL
ALT: 7 U/L (ref 0–44)
AST: 18 U/L (ref 15–41)
Albumin: 3.2 g/dL — ABNORMAL LOW (ref 3.5–5.0)
Alkaline Phosphatase: 66 U/L (ref 38–126)
Anion gap: 9 (ref 5–15)
BUN: 20 mg/dL (ref 8–23)
CO2: 20 mmol/L — ABNORMAL LOW (ref 22–32)
Calcium: 9.1 mg/dL (ref 8.9–10.3)
Chloride: 105 mmol/L (ref 98–111)
Creatinine, Ser: 0.88 mg/dL (ref 0.44–1.00)
GFR calc Af Amer: >60 mL/min (ref >60)
GFR calc non Af Amer: >60 mL/min (ref >60)
Glucose, Bld: 102 mg/dL — ABNORMAL HIGH (ref 70–99)
Potassium: 4.4 mmol/L (ref 3.5–5.1)
Sodium: 134 mmol/L — ABNORMAL LOW (ref 135–145)
Total Bilirubin: 0.4 mg/dL (ref 0.3–1.2)
Total Protein: 6.2 g/dL — ABNORMAL LOW (ref 6.5–8.1)

## 2018-05-17 LAB — HEMOGLOBIN AND HEMATOCRIT, BLOOD
HCT: 23.8 % — ABNORMAL LOW (ref 36.0–46.0)
Hemoglobin: 7.2 g/dL — ABNORMAL LOW (ref 12.0–15.0)

## 2018-05-17 LAB — PROTIME-INR
INR: 1.2 (ref 0.8–1.2)
Prothrombin Time: 14.9 seconds (ref 11.4–15.2)

## 2018-05-17 LAB — POC OCCULT BLOOD, ED: Fecal Occult Bld: POSITIVE — AB

## 2018-05-17 LAB — PREPARE RBC (CROSSMATCH)

## 2018-05-17 LAB — SARS CORONAVIRUS 2 BY RT PCR (HOSPITAL ORDER, PERFORMED IN ~~LOC~~ HOSPITAL LAB): SARS Coronavirus 2: NEGATIVE

## 2018-05-17 MED ORDER — METOPROLOL TARTRATE 50 MG PO TABS
50.0000 mg | ORAL_TABLET | Freq: Two times a day (BID) | ORAL | Status: DC
  Administered 2018-05-17 – 2018-05-28 (×19): 50 mg via ORAL
  Filled 2018-05-17 (×4): qty 1
  Filled 2018-05-17: qty 2
  Filled 2018-05-17 (×9): qty 1
  Filled 2018-05-17: qty 2
  Filled 2018-05-17 (×4): qty 1
  Filled 2018-05-17: qty 2
  Filled 2018-05-17 (×2): qty 1

## 2018-05-17 MED ORDER — ATORVASTATIN CALCIUM 40 MG PO TABS
40.0000 mg | ORAL_TABLET | Freq: Every day | ORAL | Status: DC
  Administered 2018-05-17 – 2018-05-27 (×10): 40 mg via ORAL
  Filled 2018-05-17 (×10): qty 1

## 2018-05-17 MED ORDER — LEVOTHYROXINE SODIUM 25 MCG PO TABS
125.0000 ug | ORAL_TABLET | Freq: Every day | ORAL | Status: DC
  Administered 2018-05-18 – 2018-05-28 (×10): 125 ug via ORAL
  Filled 2018-05-17 (×10): qty 1

## 2018-05-17 MED ORDER — SODIUM CHLORIDE 0.9 % IV SOLN
10.0000 mL/h | Freq: Once | INTRAVENOUS | Status: AC
  Administered 2018-05-17: 10 mL/h via INTRAVENOUS

## 2018-05-17 MED ORDER — ACETAMINOPHEN 650 MG RE SUPP: 650.0000 mg | Freq: Four times a day (QID) | RECTAL | Status: DC | PRN

## 2018-05-17 MED ORDER — ACETAMINOPHEN 325 MG PO TABS
650.0000 mg | ORAL_TABLET | Freq: Four times a day (QID) | ORAL | Status: DC | PRN
  Administered 2018-05-25 (×2): 650 mg via ORAL
  Filled 2018-05-17 (×2): qty 2

## 2018-05-17 MED ORDER — FUROSEMIDE 10 MG/ML IJ SOLN
20.0000 mg | Freq: Once | INTRAMUSCULAR | Status: AC
  Administered 2018-05-17: 20 mg via INTRAVENOUS
  Filled 2018-05-17: qty 2

## 2018-05-17 MED ORDER — PANTOPRAZOLE SODIUM 40 MG IV SOLR
40.0000 mg | Freq: Two times a day (BID) | INTRAVENOUS | Status: DC
  Administered 2018-05-17 – 2018-05-18 (×3): 40 mg via INTRAVENOUS
  Filled 2018-05-17 (×4): qty 40

## 2018-05-17 MED ORDER — BARRIER CREAM NON-SPECIFIED
1.0000 "application " | TOPICAL_CREAM | Freq: Two times a day (BID) | TOPICAL | Status: DC | PRN
  Filled 2018-05-17: qty 1

## 2018-05-17 NOTE — Progress Notes (Signed)
NEW ADMISSION NOTE New Admission Note:   Arrival Method:  Arrived from ED on stretcher. Mental Orientation: Alert and oriented x 4. Telemetry: 57M-18, NSR, pacemaker. Assessment: Completed, second Psychologist, occupational. Skin: Redness noted at the rectum, barrier cream applied. IV: R Wrist NSL, L AC blood transfusing. Pain: Denies any pain. Tubes: [place]. Safety Measures: Safety Fall Prevention Plan has been given, discussed and signed Admission: Completed [facility] [place] Orientation: Patient has been orientated to the room, unit and staff.  Family:  Orders have been reviewed and implemented. Will continue to monitor the patient. Call light has been placed within reach and bed alarm has been activated.   Amaryllis Dyke, RN

## 2018-05-17 NOTE — ED Provider Notes (Signed)
[facility] Emergency Department Provider Note MRN:  [id number]  Arrival date & time: 05/17/18     Chief Complaint   Low hemoglobin History of Present Illness   Julie Mcgee is a 85 y.o. year-old female with a history of TAVR complicated by cardiac arrest presenting to the ED with chief complaint of low hemoglobin.  Patient has been experiencing dark-colored stools for the past week or more.  Thought it was related to her iron supplements.  Stop taking the iron 3 days ago.  Continued dark or bloody stools for the past 3 days.  Denies abdominal pain.  Endorsing irritation to the rectal area but no significant pain during defecation.  Starting to feel generally weak, dyspnea on exertion for the past 1 to 2 days.  Denies fever, no cough, no chest pain, shortness of breath, no abdominal pain.  Takes aspirin and Plavix, which is a new anticoagulation regimen related to her recent TAVR.  Review of Systems  A complete 10 system review of systems was obtained and all systems are negative except as noted in the HPI and PMH.   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/2018   23 mm Edwards Sapien 3 transcatheter heart valve placed via percutaneous right transfemoral approach    Severe aortic stenosis     Past Surgical History:  Procedure Laterality Date   BASAL CELL CARCINOMA EXCISION  2018   CARDIAC CATHETERIZATION     COLONOSCOPY  2001   HIP SURGERY  2005   [place]   PACEMAKER IMPLANT N/A 03/02/2018   Procedure: PACEMAKER IMPLANT;  Surgeon:  Evans Lance, MD;  Location: [place] [facility];  Service: Cardiovascular;  Laterality: N/A;   RIGHT/LEFT HEART CATH AND CORONARY ANGIOGRAPHY N/A 02/21/2018   Procedure: RIGHT/LEFT HEART CATH AND CORONARY ANGIOGRAPHY;  Surgeon: Sherren Mocha, MD;  Location: [place] [facility];  Service: Cardiovascular;  Laterality: N/A;   TEE WITHOUT CARDIOVERSION N/A 02/28/2018   Procedure: TRANSESOPHAGEAL ECHOCARDIOGRAM (TEE);  Surgeon: Sherren Mocha, MD;  Location: [place] [facility];  Service: Open Heart Surgery;  Laterality: N/A;   TEMPORARY PACEMAKER N/A 03/01/2018   Procedure: TEMPORARY PACEMAKER;  Surgeon: [place] Blanks, MD;  Location: [place] [facility];  Service: Cardiovascular;  Laterality: N/A;   TONSILLECTOMY     TRANSCATHETER AORTIC VALVE REPLACEMENT, TRANSFEMORAL N/A 02/28/2018   Procedure: TRANSCATHETER AORTIC VALVE REPLACEMENT, TRANSFEMORAL;  Surgeon: Sherren Mocha, MD;  Location: [place] [facility];  Service: Open Heart Surgery;  Laterality: N/A;    Family History  Problem Relation Age of Onset   Heart failure Mother    Chronic Renal Failure Mother    Alcohol abuse Father     Social History   Socioeconomic History   Marital status: Married    Spouse name: Not on file   Number of children: Not on file   Years of education: Not on file   Highest education level: Not on file  Occupational History   Not on file  Social Designer, fashion/clothing strain: Not on file   Food insecurity:    Worry: Not on file    Inability: Not on file   Transportation needs:    Medical: Not on file    Non-medical: Not on file  Tobacco Use   Smoking status: Former Smoker    Packs/day: 1.00    Years: 45.00    Pack years: 45.00    Types: Cigarettes    Last attempt to quit: 05/23/2009    Years since quitting: 8.9   Smokeless tobacco: Never Used  Substance and Sexual Activity   Alcohol use: Not Currently   Drug use: Never   Sexual activity: Not on file  Lifestyle    Physical activity:    Days per week: Not on file    Minutes per session: Not on file   Stress: Not on file  Relationships   Social connections:    Talks on phone: Not on file    Gets together: Not on file    Attends religious service: Not on file    Active member of club or organization: Not on file    Attends meetings of clubs or organizations: Not on file    Relationship status: Not on file   Intimate partner violence:    Fear of current or ex partner: Not on file    Emotionally abused: Not on file    Physically abused: Not on file    Forced sexual activity: Not on file  Other Topics Concern   Not on file  Social History Narrative   Not on file     Physical Exam  Vital Signs and Nursing Notes reviewed Vitals:   05/17/18 1502 05/17/18 1505  BP:  (!) 139/44  Pulse: 65 64  Resp: 18 17  Temp:    SpO2: 97% 99%    CONSTITUTIONAL: Well-appearing, NAD NEURO:  Alert and oriented x 3, no focal deficits EYES:  eyes equal and reactive ENT/NECK:  no LAD, no JVD CARDIO: Regular rate, well-perfused, normal S1 and S2 PULM:  CTAB no wheezing or rhonchi GI/GU:  normal bowel sounds, non-distended, non-tender; mild irritation surrounding the anus, no evidence of hemorrhoids, gross maroon-colored blood per rectum MSK/SPINE:  No gross deformities, no edema SKIN:  no rash, atraumatic PSYCH:  Appropriate speech and behavior  Diagnostic and Interventional Summary    Labs Reviewed  COMPREHENSIVE METABOLIC PANEL - Abnormal; Notable for the following components:      Result Value   Sodium 134 (*)    CO2 20 (*)    Glucose, Bld 102 (*)    Total Protein 6.2 (*)    Albumin 3.2 (*)    All other components within normal limits  CBC WITH DIFFERENTIAL/PLATELET - Abnormal; Notable for the following components:   RBC 2.27 (*)    Hemoglobin 5.8 (*)    HCT 21.1 (*)    MCH 25.6 (*)    MCHC 27.5 (*)    RDW 18.6 (*)    All other components within normal limits  POC OCCULT BLOOD, ED -  Abnormal; Notable for the following components:   Fecal Occult Bld POSITIVE (*)    All other components within normal limits  SARS CORONAVIRUS 2 (HOSPITAL ORDER, [facility])  PROTIME-INR  TYPE AND SCREEN  PREPARE RBC (CROSSMATCH)    No orders to display    Medications  0.9 %  sodium chloride infusion (10 mL/hr Intravenous New Bag/Given 05/17/18 1339)  Procedures Critical Care Critical Care Documentation Critical care time provided by me (excluding procedures): 33 minutes  Condition necessitating critical care: Symptomatic anemia, GI bleeding  Components of critical care management: reviewing of prior records, laboratory and imaging interpretation, frequent re-examination and reassessment of vital signs, administration of packed red blood cells, discussion with consulting services    ED Course and Medical Decision Making  I have reviewed the triage vital signs and the nursing notes.  Pertinent labs & imaging results that were available during my care of the patient were reviewed by me and considered in my medical decision making (see below for details).  Report of low hemoglobin under 6.0 measured at home in this 85 year old female with history of TAVR, recently started on Plavix.  Painless bleeding, no abdominal tenderness, currently with normal vital signs, normal mentation, will confirm H&H, likely transfuse.  Hemoglobin less than 6, consented and transfused, to be admitted to unassigned medicine service.  Barth Kirks. [place] Small, MD [place] [email or web address]  Final Clinical Impressions(s) / ED Diagnoses     ICD-10-CM   1. Anticoagulated Z79.01   2. Symptomatic anemia D64.9   3. Gastrointestinal hemorrhage, unspecified gastrointestinal hemorrhage type K92.2     ED Discharge Orders    None         Maudie Flakes, MD 05/17/18 [phone]

## 2018-05-17 NOTE — ED Notes (Signed)
Patient receiving second unit of blood. No adverse reactions observed in first 15 minutes. No chills, urticaria, SOB, nausea or pain reported. Jake Bathe, MEd, RN CEN 05/17/2018 5:21 PM

## 2018-05-17 NOTE — ED Notes (Addendum)
Critical lab hgb  5.8 reported to dr. Sedonia Small

## 2018-05-17 NOTE — ED Notes (Signed)
Pt son Julie Mcgee [phone]

## 2018-05-17 NOTE — ED Notes (Signed)
Nurse Navigator updated son

## 2018-05-17 NOTE — Consult Note (Signed)
Consultation  Referring Provider:[facility] MD/[facility] Primary Care Physician:  Lyman Bishop, DO Primary Gastroenterologist:  none.  Reason for Consultation: GI bleed  HPI: Julie Mcgee is a 85 y.o. female with history of congestive heart failure, right bundle branch block, severe aortic stenosis status post total aortic valve replacement with Oletta Lamas valve February 2020.  Was complicated by cardiac arrest 1 day postop secondary to complete heart block and underwent permanent pacemaker placement.  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 Patient has been on aspirin and Plavix post valve replacement she was also discharged from the hospital on oral iron for anemia with 8.5-9 range at the time of discharge. Patient says she started having "diarrhea" over the past 1 to 1-1/2 weeks and noticed that her stool was very dark or blackish thought this was due to iron.  She has not had any complaints of abdominal pain or discomfort, no nausea or vomiting, no heartburn or indigestion and no hematemesis.  She has noticed some exertional dyspnea over the past week, denies lightheadedness or chest pain.  She had labs drawn as an outpatient and was noted to have a significant drop in hemoglobin and was advised to come to the emergency room. She has been hemodynamically stable, labs today revealed a hemoglobin of 5.8, hematocrit of 21.1 MCV of 93 BUN 20 creatinine 0.8. Review of previous labs shows hemoglobin 8.7 on 03/16/2018. On rectal exam per ER physician noted to have dark maroonish stool.  Patient believes that she had one prior colonoscopy, done in [place] at least 10 years ago believes this was normal, no prior EGD to her knowledge She has no prior history of GI bleeding.    Past Medical History:  Diagnosis Date  . Arthritis   . Carotid artery stenosis   . Chronic diastolic CHF (congestive heart  failure) ([place])   . Former smoker    1/2 ppd for 45 years  . 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 Hyperlipidemia   . Hypertension   . Hypothyroidism   . Peripheral vascular disease ([place])   . RBBB   . 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 Severe aortic stenosis     Past Surgical History:  Procedure Laterality Date  . BASAL CELL CARCINOMA EXCISION  2018  . CARDIAC CATHETERIZATION    . COLONOSCOPY  2001  . HIP SURGERY  2005  . KNEE SURGERY  1998  . PACEMAKER IMPLANT N/A 03/02/2018   Procedure: PACEMAKER IMPLANT;  Surgeon: Evans Lance, MD;  Location: [facility];  Service: Cardiovascular;  Laterality: N/A;  . RIGHT/LEFT HEART CATH AND CORONARY ANGIOGRAPHY N/A 02/21/2018   Procedure: RIGHT/LEFT HEART CATH AND CORONARY ANGIOGRAPHY;  Surgeon: Sherren Mocha, MD;  Location: [facility];  Service: Cardiovascular;  Laterality: N/A;  . TEE WITHOUT CARDIOVERSION N/A 02/28/2018   Procedure: TRANSESOPHAGEAL ECHOCARDIOGRAM (TEE);  Surgeon: Sherren Mocha, MD;  Location: [facility];  Service: Open Heart Surgery;  Laterality: N/A;  . TEMPORARY PACEMAKER N/A 03/01/2018   Procedure: TEMPORARY PACEMAKER;  Surgeon: Burnell Blanks, MD;  Location: [facility];  Service: Cardiovascular;  Laterality: N/A;  . TONSILLECTOMY    . TRANSCATHETER AORTIC VALVE REPLACEMENT, TRANSFEMORAL N/A 02/28/2018   Procedure: TRANSCATHETER  AORTIC VALVE REPLACEMENT, TRANSFEMORAL;  Surgeon: Sherren Mocha, MD;  Location: [facility];  Service: Open Heart Surgery;  Laterality: N/A;    Prior to Admission medications   Medication Sig Start Date End Date Taking? Authorizing Provider  acetaminophen (TYLENOL) 325 MG tablet Take 650 mg by mouth 2 (two) times daily.    [provider]  amoxicillin (AMOXIL) 500  MG tablet Please take 2,000 mg (4 tablets) one hour prior to all dental procedures. 03/16/18   Eileen Stanford, PA-C  aspirin EC 81 MG tablet Take 1 tablet (81 mg total) by mouth daily. 03/06/18   Eileen Stanford, PA-C  Calcium Carb-Cholecalciferol (CALTRATE 600+D3 PO) Take 1 tablet by mouth 2 (two) times daily.    [provider]  clopidogrel (PLAVIX) 75 MG tablet Take 1 tablet (75 mg total) by mouth daily with breakfast. 04/14/18   Eileen Stanford, PA-C  diphenhydrAMINE (BENADRYL) 25 MG tablet Take 25 mg by mouth every 6 (six) hours as needed.    [provider]  ferrous sulfate 325 (65 FE) MG tablet Take 1 tablet (325 mg total) by mouth 2 (two) times daily with a meal. 03/27/18   Revankar, Reita Cliche, MD  furosemide (LASIX) 20 MG tablet Take 1 tablet (20 mg total) by mouth daily. 04/03/18 04/03/19  Eileen Stanford, PA-C  levothyroxine (SYNTHROID, LEVOTHROID) 125 MCG tablet Take 125 mcg by mouth daily before breakfast.    [provider]  Melatonin 5 MG CAPS Take 5 mg by mouth at bedtime.    [provider]  metoprolol tartrate (LOPRESSOR) 50 MG tablet Take 50 mg by mouth 2 (two) times daily.    [provider]  Multiple Vitamins-Minerals (MULTIVITAMIN ADULTS 50+) TABS Take 1 tablet by mouth daily.    [provider]  simvastatin (ZOCOR) 80 MG tablet Take 80 mg by mouth every evening.    [provider]    No current facility-administered medications for this encounter.    Current Outpatient Medications  Medication Sig Dispense Refill  . acetaminophen (TYLENOL) 325 MG tablet Take 650 mg by mouth 2 (two) times daily.    [name] amoxicillin (AMOXIL) 500 MG tablet Please take 2,000 mg (4 tablets) one hour prior to all dental procedures. 8 tablet 8  . aspirin EC 81 MG tablet Take 1 tablet (81 mg total) by mouth daily.    . Calcium Carb-Cholecalciferol (CALTRATE 600+D3 PO) Take 1 tablet by mouth 2 (two) times daily.    . clopidogrel  (PLAVIX) 75 MG tablet Take 1 tablet (75 mg total) by mouth daily with breakfast. 90 tablet 1  . diphenhydrAMINE (BENADRYL) 25 MG tablet Take 25 mg by mouth every 6 (six) hours as needed.    . ferrous sulfate 325 (65 FE) MG tablet Take 1 tablet (325 mg total) by mouth 2 (two) times daily with a meal. 90 tablet 2  . furosemide (LASIX) 20 MG tablet Take 1 tablet (20 mg total) by mouth daily. 90 tablet 3  . levothyroxine (SYNTHROID, LEVOTHROID) 125 MCG tablet Take 125 mcg by mouth daily before breakfast.    . Melatonin 5 MG CAPS Take 5 mg by mouth at bedtime.    . metoprolol tartrate (LOPRESSOR) 50 MG tablet Take 50 mg by mouth 2 (two) times daily.    . Multiple Vitamins-Minerals (MULTIVITAMIN ADULTS 50+) TABS Take 1 tablet by mouth daily.    . simvastatin (ZOCOR) 80 MG tablet Take 80 mg by mouth every evening.  Allergies as of 05/17/2018 - Review Complete 05/17/2018  Allergen Reaction Noted  . Adhesive [tape] Other (See Comments) 02/18/2018    Family History  Problem Relation Age of Onset  . Heart failure Mother   . Chronic Renal Failure Mother   . Alcohol abuse Father     Social History   Socioeconomic History  . Marital status: Married    Spouse name: Not on file  . Number of children: Not on file  . Years of education: Not on file  . Highest education level: Not on file  Occupational History  . Not on file  Social Needs  . Financial resource strain: Not on file  . Food insecurity:    Worry: Not on file    Inability: Not on file  . Transportation needs:    Medical: Not on file    Non-medical: Not on file  Tobacco Use  . Smoking status: Former Smoker    Packs/day: 1.00    Years: 45.00    Pack years: 45.00    Types: Cigarettes    Last attempt to quit: 05/23/2009    Years since quitting: 8.9  . Smokeless tobacco: Never Used  Substance and Sexual Activity  . Alcohol use: Not Currently  . Drug use: Never  . Sexual activity: Not on file  Lifestyle  . Physical  activity:    Days per week: Not on file    Minutes per session: Not on file  . Stress: Not on file  Relationships  . Social connections:    Talks on phone: Not on file    Gets together: Not on file    Attends religious service: Not on file    Active member of club or organization: Not on file    Attends meetings of clubs or organizations: Not on file    Relationship status: Not on file  . Intimate partner violence:    Fear of current or ex partner: Not on file    Emotionally abused: Not on file    Physically abused: Not on file    Forced sexual activity: Not on file  Other Topics Concern  . Not on file  Social History Narrative  . Not on file    Review of Systems: Pertinent positive and negative review of systems were noted in the above HPI section.  All other review of systems was otherwise negative.  Physical Exam: Vital signs in last 24 hours: Temp:  [97.7 F (36.5 C)-98.4 F (36.9 C)] 98.4 F (36.9 C) (05/06 1458) Pulse Rate:  [52-68] 64 (05/06 1505) Resp:  [12-22] 17 (05/06 1505) BP: (122-139)/(44-92) 139/44 (05/06 1505) SpO2:  [97 %-100 %] 99 % (05/06 1505) Weight:  [89.3 kg] 89.3 kg (05/06 1238)   General:   Alert,  Well-developed, well-nourished,eldrly WF pleasant and cooperative in NAD Head:  Normocephalic and atraumatic. Eyes:  Sclera clear, no icterus.   Conjunctiva pale. Ears:  Normal auditory acuity. Nose:  No deformity, discharge,  or lesions. Mouth:  No deformity or lesions.   Neck:  Supple; no masses or thyromegaly. Lungs:  Clear throughout to auscultation.   No wheezes, crackles, or rhonchi.  Heart:  Regular rate and rhythm; valve click   Pacer left chest wall. Abdomen:  Soft,nontender, BS active,nonpalp mass or hsm.   Rectal:  maroonish stool per ER MD Msk:  Symmetrical without gross deformities. . Pulses:  Normal pulses noted. Extremities:  Without clubbing or edema. Neurologic:  Alert and  oriented x4;slight dysarthria  ,right hemiparesis  Skin:   Intact without significant lesions or rashes.. Psych:  Alert and cooperative. Normal mood and affect.  Intake/Output from previous day: No intake/output data recorded. Intake/Output this shift: Total I/O In: 10 [I.V.:10] Out: -   Lab Results: Recent Labs    05/17/18 1244  WBC 9.2  HGB 5.8*  HCT 21.1*  PLT 396   BMET Recent Labs    05/17/18 1244  NA 134*  K 4.4  CL 105  CO2 20*  GLUCOSE 102*  BUN 20  CREATININE 0.88  CALCIUM 9.1   LFT Recent Labs    05/17/18 1244  PROT 6.2*  ALBUMIN 3.2*  AST 18  ALT 7  ALKPHOS 66  BILITOT 0.4   PT/INR Recent Labs    05/17/18 1244  LABPROT 14.9  INR 1.2     IMPRESSION:  #81 85 year old white female with acute/subacute GI bleeding, presenting with 1 to 1-1/2-week history of "diarrhea", black in color. Patient is hemodynamically stable with hemoglobin of 5.8.  She has had recent anemia hospitalization for aortic valve replacement in February with hemoglobin of 8.7 in March 2020. Etiology of bleeding is not clear, higher suspicion for upper GI source at present. #2 chronic antiplatelet therapy-on aspirin and Plavix-last dose of Plavix today #3 status post total aortic valve replacement February 2020, mechanical valve #4 cardiac arrest- postop aortic valve replacement secondary to complete heart block, status post pacemaker placement #5 history of congestive heart failure #6.  Is post CVA 2011 with persistent right hemiparesis #7 history of carotid stenosis #8 vascular disease  Plan; clear liquid diet Transfuse to keep hemoglobin above 7, 2 units have been ordered initially IV Protonix twice daily Hold Plavix Cardiology consultation to help with management of antiplatelet therapy in setting of new aortic valve Will plan for endoscopic evaluation this admission, will decide on EGD versus EGD and colonoscopy next on her course and safely plan for Friday with Dr. Bryan Lemma We will follow with you    Tonjua Rossetti  PA-C 05/17/2018, 4:23 PM

## 2018-05-17 NOTE — ED Notes (Signed)
ED TO INPATIENT HANDOFF REPORT  ED Nurse Name and Phone #:  Blanch Media [phone]  S Name/Age/Gender Julie Mcgee 85 y.o. female Room/Bed: 034C/034C  Code Status   Code Status: Prior  Home/SNF/Other Home Patient oriented to: self, place, time and situation Is this baseline? Yes   Triage Complete: Triage complete  Chief Complaint Hemoglobin 5.6  Triage Note Patient arrived POV after receiving results of low hemoglobin. Reports dyspnea on exertion and dark stool. Denies chest pain. Vital signs stable. No obvious distress.     Allergies Allergies  Allergen Reactions  . Adhesive [Tape] Other (See Comments)    Causes BLISTERS    Level of Care/Admitting Diagnosis ED Disposition    None      B Medical/Surgery History Past Medical History:  Diagnosis Date  . Arthritis   . Carotid artery stenosis   . Chronic diastolic CHF (congestive heart failure) ([place])   . Former smoker    1/2 ppd for 45 years  . 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 Hyperlipidemia   . Hypertension   . Hypothyroidism   . Peripheral vascular disease ([place])   . RBBB   . 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 Severe aortic stenosis    Past Surgical History:  Procedure Laterality Date  . BASAL CELL CARCINOMA EXCISION  2018  . CARDIAC CATHETERIZATION    . COLONOSCOPY  2001  . HIP SURGERY  2005  . KNEE SURGERY  1998  . PACEMAKER IMPLANT N/A 03/02/2018   Procedure: PACEMAKER IMPLANT;  Surgeon: Evans Lance, MD;  Location: [facility];  Service: Cardiovascular;  Laterality: N/A;  . RIGHT/LEFT HEART CATH AND CORONARY ANGIOGRAPHY N/A 02/21/2018   Procedure: RIGHT/LEFT HEART CATH AND CORONARY ANGIOGRAPHY;  Surgeon: Sherren Mocha, MD;  Location: [facility];  Service: Cardiovascular;  Laterality: N/A;  . TEE WITHOUT  CARDIOVERSION N/A 02/28/2018   Procedure: TRANSESOPHAGEAL ECHOCARDIOGRAM (TEE);  Surgeon: Sherren Mocha, MD;  Location: [facility];  Service: Open Heart Surgery;  Laterality: N/A;  . TEMPORARY PACEMAKER N/A 03/01/2018   Procedure: TEMPORARY PACEMAKER;  Surgeon: Burnell Blanks, MD;  Location: [facility];  Service: Cardiovascular;  Laterality: N/A;  . TONSILLECTOMY    . TRANSCATHETER AORTIC VALVE REPLACEMENT, TRANSFEMORAL N/A 02/28/2018   Procedure: TRANSCATHETER AORTIC VALVE REPLACEMENT, TRANSFEMORAL;  Surgeon: Sherren Mocha, MD;  Location: [facility];  Service: Open Heart Surgery;  Laterality: N/A;     A IV Location/Drains/Wounds Patient Lines/Drains/Airways Status   Active Line/Drains/Airways    Name:   Placement date:   Placement time:   Site:   Days:   Peripheral IV 05/17/18 Right Wrist   05/17/18    1310    Wrist   less than 1   Peripheral IV 05/17/18 Left Antecubital   05/17/18    1200    Antecubital   less than 1   External Urinary Catheter   02/28/18    1747    -   78   Incision (Closed) 03/02/18 Chest Left;Upper   03/02/18    1300     76          Intake/Output Last 24 hours  Intake/Output Summary (Last 24 hours) at 05/17/2018 1453 Last data filed at 05/17/2018 1449 Gross per 24 hour  Intake 10 ml  Output -  Net 10 ml    Labs/Imaging Results for orders placed or performed during the hospital encounter of 05/17/18 (from the past 48 hour(s))  Comprehensive metabolic panel     Status: Abnormal   Collection Time: 05/17/18 12:44 PM  Result Value Ref Range   Sodium 134 (L) 135 - 145 mmol/L   Potassium 4.4 3.5 - 5.1 mmol/L   Chloride 105 98 - 111 mmol/L   CO2 20 (L) 22 - 32 mmol/L   Glucose, Bld 102 (H) 70 - 99 mg/dL   BUN 20 8 - 23 mg/dL   Creatinine, Ser 0.88 0.44 - 1.00 mg/dL   Calcium 9.1 8.9 - 10.3 mg/dL   Total Protein 6.2 (L) 6.5 - 8.1 g/dL   Albumin 3.2 (L) 3.5 - 5.0 g/dL   AST 18 15 - 41 U/L   ALT 7 0 - 44 U/L   Alkaline  Phosphatase 66 38 - 126 U/L   Total Bilirubin 0.4 0.3 - 1.2 mg/dL   GFR calc non Af Amer >60 >60 mL/min   GFR calc Af Amer >60 >60 mL/min   Anion gap 9 5 - 15    Comment: Performed at [facility], [address] [place], [place] [address]  CBC WITH DIFFERENTIAL     Status: Abnormal   Collection Time: 05/17/18 12:44 PM  Result Value Ref Range   WBC 9.2 4.0 - 10.5 K/uL   RBC 2.27 (L) 3.87 - 5.11 MIL/uL   Hemoglobin 5.8 (LL) 12.0 - 15.0 g/dL    Comment: REPEATED TO VERIFY THIS CRITICAL RESULT HAS VERIFIED AND BEEN CALLED TO NICOLE SIMMONS,RN BY ZELDA BEECH ON 05 06 2020 AT 0160, AND HAS BEEN READ BACK.     HCT 21.1 (L) 36.0 - 46.0 %   MCV 93.0 80.0 - 100.0 fL   MCH 25.6 (L) 26.0 - 34.0 pg   MCHC 27.5 (L) 30.0 - 36.0 g/dL   RDW 18.6 (H) 11.5 - 15.5 %   Platelets 396 150 - 400 K/uL   nRBC 0.0 0.0 - 0.2 %   Neutrophils Relative % 77 %   Neutro Abs 7.1 1.7 - 7.7 K/uL   Lymphocytes Relative 12 %   Lymphs Abs 1.1 0.7 - 4.0 K/uL   Monocytes Relative 8 %   Monocytes Absolute 0.7 0.1 - 1.0 K/uL   Eosinophils Relative 2 %   Eosinophils Absolute 0.2 0.0 - 0.5 K/uL   Basophils Relative 1 %   Basophils Absolute 0.1 0.0 - 0.1 K/uL   Immature Granulocytes 0 %   Abs Immature Granulocytes 0.03 0.00 - 0.07 K/uL    Comment: Performed at [facility] [address] [place], [place] [address]  Protime-INR     Status: None   Collection Time: 05/17/18 12:44 PM  Result Value Ref Range   Prothrombin Time 14.9 11.4 - 15.2 seconds   INR 1.2 0.8 - 1.2    Comment: (NOTE) INR goal varies based on device and disease states. Performed at [facility], [place] [address], [place], [place] [address]   Type and screen [place]     Status: None (Preliminary result)   Collection Time: 05/17/18 12:47 PM  Result Value Ref Range   ABO/RH(D) A POS    Antibody Screen NEG    Sample Expiration 05/20/2018,2359    Unit Number [id number]    Blood Component Type RED CELLS,LR     Unit division 00    Status of Unit  ISSUED    Transfusion Status OK TO TRANSFUSE    Crossmatch Result      Compatible Performed at [facility], [place] [address] [place], [place] [address]    Unit Number [id number]    Blood Component Type RED CELLS,LR    Unit division 00    Status of Unit ALLOCATED    Transfusion Status OK TO TRANSFUSE    Crossmatch Result Compatible   Prepare RBC     Status: None (Preliminary result)   Collection Time: 05/17/18 12:47 PM  Result Value Ref Range   Order Confirmation      ORDER PROCESSED BY BLOOD BANK Performed at [facility], [place] [address] [place], [place] [address]   POC occult blood, ED     Status: Abnormal   Collection Time: 05/17/18 12:56 PM  Result Value Ref Range   Fecal Occult Bld POSITIVE (A) NEGATIVE  SARS Coronavirus 2 (CEPHEID - Performed in [facility] hospital lab), Hosp Order     Status: None   Collection Time: 05/17/18  1:15 PM  Result Value Ref Range   SARS Coronavirus 2 NEGATIVE NEGATIVE    Comment: (NOTE) If result is NEGATIVE SARS-CoV-2 target nucleic acids are NOT DETECTED. The SARS-CoV-2 RNA is generally detectable in upper and lower  respiratory specimens during the acute phase of infection. The lowest  concentration of SARS-CoV-2 viral copies this assay can detect is 250  copies / mL. A negative result does not preclude SARS-CoV-2 infection  and should not be used as the sole basis for treatment or other  patient management decisions.  A negative result may occur with  improper specimen collection / handling, submission of specimen other  than nasopharyngeal swab, presence of viral mutation(s) within the  areas targeted by this assay, and inadequate number of viral copies  (<250 copies / mL). A negative result must be combined with clinical  observations, patient history, and epidemiological information. If result is POSITIVE SARS-CoV-2 target nucleic acids are DETECTED. The SARS-CoV-2 RNA is  generally detectable in upper and lower  respiratory specimens dur ing the acute phase of infection.  Positive  results are indicative of active infection with SARS-CoV-2.  Clinical  correlation with patient history and other diagnostic information is  necessary to determine patient infection status.  Positive results do  not rule out bacterial infection or co-infection with other viruses. If result is PRESUMPTIVE POSTIVE SARS-CoV-2 nucleic acids MAY BE PRESENT.   A presumptive positive result was obtained on the submitted specimen  and confirmed on repeat testing.  While 2019 novel coronavirus  (SARS-CoV-2) nucleic acids may be present in the submitted sample  additional confirmatory testing may be necessary for epidemiological  and / or clinical management purposes  to differentiate between  SARS-CoV-2 and other Sarbecovirus currently known to infect humans.  If clinically indicated additional testing with an alternate test  methodology [phone]) is advised. The SARS-CoV-2 RNA is generally  detectable in upper and lower respiratory sp ecimens during the acute  phase of infection. The expected result is Negative. Fact Sheet for Patients:  StrictlyIdeas.no Fact Sheet for Healthcare Providers: BankingDealers.co.za This test is not yet approved or cleared by the [place] FDA and has been authorized for detection and/or diagnosis of SARS-CoV-2 by FDA under an Emergency Use Authorization (EUA).  This EUA will remain in effect (meaning this test can be used) for the duration of the COVID-19 declaration under Section 564(b)(1) of the Act, 21 U.S.C. section 360bbb-3(b)(1),  unless the authorization is terminated or revoked sooner. Performed at [facility], [place] [address], [place], [place] [address]    No results found.  Pending Labs Unresulted Labs (From admission, onward)   None      Vitals/Pain Today's Vitals   05/17/18  1439 05/17/18 1440 05/17/18 1445 05/17/18 1449  BP: (!) 133/51  (!) 129/51 (!) 135/48  Pulse: 65  (!) 52 66  Resp: 20  (!) 22 17  Temp: 98.3 F (36.8 C)     TempSrc: Oral     SpO2: 98%  100% 99%  Weight:      Height:      PainSc:  0-No pain      Isolation Precautions No active isolations  Medications Medications  0.9 %  sodium chloride infusion (10 mL/hr Intravenous New Bag/Given 05/17/18 1339)    Mobility walks with device Moderate fall risk   Focused Assessments Cardiac Assessment Handoff:    Lab Results  Component Value Date   TROPONINI 0.10 ([place]) 02/19/2018   No results found for: DDIMER Does the Patient currently have chest pain? Yes     R Recommendations: See Admitting Provider Note  Report given to:   Additional Notes: Patient alert and oriented X4. Denies chest pain or shortness of breath. Reports episodes of diarrhea and dark stool. Recent increase in dyspnea.  Vital signs stable. Patient on room air at 100%. Receiving first unit of blood. No reaction.

## 2018-05-17 NOTE — ED Notes (Signed)
ED Provider at bedside. 

## 2018-05-17 NOTE — ED Triage Notes (Signed)
Patient arrived POV after receiving results of low hemoglobin. Reports dyspnea on exertion and dark stool. Denies chest pain. Vital signs stable. No obvious distress.

## 2018-05-17 NOTE — ED Notes (Addendum)
Nurse Navigator called son, he states that pt has been sob ever since her TAVR in march and he wants to make sure we are aware.

## 2018-05-17 NOTE — ED Notes (Signed)
Patient alert and oriented. Reports no adverse reactions to blood transfusion. No change in temperature, skin assessment, breathing or circulation Jake Bathe, Georgiann Hahn, RN CEN 05/17/2018 3:02 PM

## 2018-05-17 NOTE — Consult Note (Signed)
Cardiology Consultation:   Patient ID: Julie Mcgee MRN: [id number]; DOB: [birth year]  Admit date: 05/17/2018 Date of Consult: 05/17/2018  Primary Care Provider: Lyman Bishop, DO Primary Cardiologist: Jenean Lindau, MD  Interventionalist/TAVR: Dr. Sherren Mocha Primary Electrophysiologist: Dr. Lovena Le   Patient Profile:   Julie Mcgee is a 85 y.o. female with a hx of severe aortic stenosis s/p TAVR, CHB s/p PPM, chronic diastolic heart failure, hypertension, hyperlipidemia, RBBB, CVA with residual R hemiparesis, and carotid stenosis who is being seen today for the evaluation of GI bleed at the request of Dr. Higinio Plan .  History of Present Illness:   [name] was admitted from home with melena.  She reported 1-2 weeks of dark stools and lightheadedness.  She called the cardiology office and it was recommended that she go to the ED for assessment.  In the ED BP was 139/44, HR 65.  Hemoglobin was 5.8 and pRBC transfusion was ordered. She was admitted to the family medicine service and GI was consulted.  There are plans to perform EGD in 1-2 days.     On 02/28/18 she had a 32mm [place] 3 TAVR implanted by Drs. Burt Knack and [employer].  Prior to the TAVR she had a LHC that showed 40% LAD disease and minimal RCA disease. Her TAVR was complicated by complete heart block requiring implantation of a MDT dual chamber PPM.  Of note, she was anemic during that hospitalization and required transfusion.  She was discharged on oral iron.  She last followed up with Angelena Form, PA-C on 04/03/18 and was doing well, though she continued to have some exertional dyspnea. Plans were established to continue ASA and clopidogrel through 08/2018 and aspirin alone after that time.  The patient reports being in her normal state of health until a few days ago when she developed severe diarrhea.  States that her stools been black.  Denies bright red blood.  She is also had increasing shortness of breath.  She  denies any chest pain, orthopnea, or PND.  She denies fevers or chills.  She was started on an antibiotic for a presumed urinary tract infection recently.  She had initially attributed her diarrhea to a side effect of the antibiotic.  She presented to the hospital today because of diarrhea and was noted to have severe anemia with a clinical history suggestive of a GI bleed.  She is being admitted by the family medicine service and gastroenterology has already been consulted.  We are asked to see her because of recent transcatheter aortic valve replacement and dual antiplatelet therapy.  Past Medical History:  Diagnosis Date  . Arthritis   . Carotid artery stenosis   . Chronic diastolic CHF (congestive heart failure) ([place])   . Former smoker    1/2 ppd for 45 years  . 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 Hyperlipidemia   . Hypertension   . Hypothyroidism   . Peripheral vascular disease ([place])   . RBBB   . 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 Severe aortic stenosis     Past Surgical History:  Procedure Laterality Date  . BASAL CELL CARCINOMA EXCISION  2018  . CARDIAC CATHETERIZATION    . COLONOSCOPY  2001  . HIP SURGERY  2005  . KNEE SURGERY  1998  . PACEMAKER IMPLANT N/A 03/02/2018   Procedure:  PACEMAKER IMPLANT;  Surgeon: Evans Lance, MD;  Location: [facility];  Service: Cardiovascular;  Laterality: N/A;  . RIGHT/LEFT HEART CATH AND CORONARY ANGIOGRAPHY N/A 02/21/2018   Procedure: RIGHT/LEFT HEART CATH AND CORONARY ANGIOGRAPHY;  Surgeon: Sherren Mocha, MD;  Location: [facility];  Service: Cardiovascular;  Laterality: N/A;  . TEE WITHOUT CARDIOVERSION N/A 02/28/2018   Procedure: TRANSESOPHAGEAL ECHOCARDIOGRAM (TEE);  Surgeon: Sherren Mocha, MD;  Location: [facility];  Service: Open Heart  Surgery;  Laterality: N/A;  . TEMPORARY PACEMAKER N/A 03/01/2018   Procedure: TEMPORARY PACEMAKER;  Surgeon: Burnell Blanks, MD;  Location: [facility];  Service: Cardiovascular;  Laterality: N/A;  . TONSILLECTOMY    . TRANSCATHETER AORTIC VALVE REPLACEMENT, TRANSFEMORAL N/A 02/28/2018   Procedure: TRANSCATHETER AORTIC VALVE REPLACEMENT, TRANSFEMORAL;  Surgeon: Sherren Mocha, MD;  Location: [facility];  Service: Open Heart Surgery;  Laterality: N/A;     Home Medications:  Prior to Admission medications   Medication Sig Start Date End Date Taking? Authorizing Provider  acetaminophen (TYLENOL) 325 MG tablet Take 650 mg by mouth 2 (two) times daily.    [provider]  amoxicillin (AMOXIL) 500 MG tablet Please take 2,000 mg (4 tablets) one hour prior to all dental procedures. 03/16/18   Eileen Stanford, PA-C  aspirin EC 81 MG tablet Take 1 tablet (81 mg total) by mouth daily. 03/06/18   Eileen Stanford, PA-C  Calcium Carb-Cholecalciferol (CALTRATE 600+D3 PO) Take 1 tablet by mouth 2 (two) times daily.    [provider]  clopidogrel (PLAVIX) 75 MG tablet Take 1 tablet (75 mg total) by mouth daily with breakfast. 04/14/18   Eileen Stanford, PA-C  diphenhydrAMINE (BENADRYL) 25 MG tablet Take 25 mg by mouth every 6 (six) hours as needed.    [provider]  ferrous sulfate 325 (65 FE) MG tablet Take 1 tablet (325 mg total) by mouth 2 (two) times daily with a meal. 03/27/18   Revankar, Reita Cliche, MD  furosemide (LASIX) 20 MG tablet Take 1 tablet (20 mg total) by mouth daily. 04/03/18 04/03/19  Eileen Stanford, PA-C  levothyroxine (SYNTHROID, LEVOTHROID) 125 MCG tablet Take 125 mcg by mouth daily before breakfast.    [provider]  Melatonin 5 MG CAPS Take 5 mg by mouth at bedtime.    [provider]  metoprolol tartrate (LOPRESSOR) 50 MG tablet Take 50 mg by mouth 2 (two) times daily.    [provider]  Multiple  Vitamins-Minerals (MULTIVITAMIN ADULTS 50+) TABS Take 1 tablet by mouth daily.    [provider]  simvastatin (ZOCOR) 80 MG tablet Take 80 mg by mouth every evening.    [provider]    Inpatient Medications: Scheduled Meds: . atorvastatin  40 mg Oral q1800  . [START ON 05/18/2018] levothyroxine  125 mcg Oral QAC breakfast  . metoprolol tartrate  50 mg Oral BID  . pantoprazole (PROTONIX) IV  40 mg Intravenous Q12H   Continuous Infusions:  PRN Meds: acetaminophen **OR** acetaminophen, barrier cream  Allergies:    Allergies  Allergen Reactions  . Adhesive [Tape] Other (See Comments)    Causes BLISTERS    Social History:   Social History   Socioeconomic History  . Marital status: Married    Spouse name: Not on file  . Number of children: Not on file  . Years of education: Not on file  . Highest education level: Not on file  Occupational  History  . Not on file  Social Needs  . Financial resource strain: Not on file  . Food insecurity:    Worry: Not on file    Inability: Not on file  . Transportation needs:    Medical: Not on file    Non-medical: Not on file  Tobacco Use  . Smoking status: Former Smoker    Packs/day: 1.00    Years: 45.00    Pack years: 45.00    Types: Cigarettes    Last attempt to quit: 05/23/2009    Years since quitting: 8.9  . Smokeless tobacco: Never Used  Substance and Sexual Activity  . Alcohol use: Not Currently  . Drug use: Never  . Sexual activity: Not on file  Lifestyle  . Physical activity:    Days per week: Not on file    Minutes per session: Not on file  . Stress: Not on file  Relationships  . Social connections:    Talks on phone: Not on file    Gets together: Not on file    Attends religious service: Not on file    Active member of club or organization: Not on file    Attends meetings of clubs or organizations: Not on file    Relationship status: Not on file  . Intimate partner violence:    Fear of  current or ex partner: Not on file    Emotionally abused: Not on file    Physically abused: Not on file    Forced sexual activity: Not on file  Other Topics Concern  . Not on file  Social History Narrative  . Not on file    Family History:   Family History  Problem Relation Age of Onset  . Heart failure Mother   . Chronic Renal Failure Mother   . Alcohol abuse Father      ROS:  Please see the history of present illness.  Positive for shortness of breath. All other ROS reviewed and negative.     Physical Exam/Data:   Vitals:   05/17/18 1710 05/17/18 1715 05/17/18 1718 05/17/18 1720  BP: (!) 157/47 (!) 140/54  (!) 150/61  Pulse: 68 65 66 66  Resp: (!) 23 (!) 25 16 (!) 22  Temp:      TempSrc:      SpO2: 99% 100% 100% 100%  Weight:      Height:        Intake/Output Summary (Last 24 hours) at 05/17/2018 1729 Last data filed at 05/17/2018 1713 Gross per 24 hour  Intake 10 ml  Output 200 ml  Net -190 ml   Last 3 Weights 05/17/2018 03/16/2018 03/06/2018  Weight (lbs) 196 lb 13.9 oz 196 lb 12.8 oz 218 lb 14.7 oz  Weight (kg) 89.3 kg 89.268 kg 99.3 kg     Body mass index is 37.2 kg/m.  General: Pleasant obese woman in no distress HEENT: normal Lymph: no adenopathy Neck: no JVD Endocrine:  No thryomegaly Vascular: Bilateral carotid bruits;  Cardiac:  normal S1, S2; RRR; 2/6 early peaking systolic ejection murmur best heard at the left lower sternal border Lungs:  clear to auscultation bilaterally, no wheezing, rhonchi or rales  Abd: soft, nontender, no hepatomegaly  Ext: Trace bilateral pretibial edema Musculoskeletal:  No deformities, BUE and BLE strength normal and equal Skin: warm and dry  Neuro:  CNs 2-12 intact, no focal abnormalities noted Psych:  Normal affect   EKG:  The EKG was personally reviewed and demonstrates: EKG from 03/03/2018  demonstrates a sensed V paced rhythm at 84 bpm  Relevant CV Studies:  Echo 03/01/18: IMPRESSIONS 1. The left ventricle has  normal systolic function, with an ejection fraction of 60-65%. The cavity size was normal. There is moderately increased left ventricular wall thickness. 2. Left atrial size was severely dilated. 3. Mildly thickened tricuspid valve leaflets. 4. The mitral valve is rheumatic. Severe thickening of the mitral valve leaflet. Severe calcification of the mitral valve leaflet. There is severe mitral annular calcification present. Moderate mitral valve stenosis. 5. The tricuspid valve was normal in structure. Tricuspid valve regurgitation is moderate. 6. Post TAVR with 23 mm Sapien 3 valve Not well visualized no PVL gradients have increased considerably since implant 02/28/18.  LHC 02/21/18:    Carotid Doppler 02/21/18: 1-39% ICA stenosis bilaterally  Laboratory Data:  Chemistry Recent Labs  Lab 05/17/18 1244  NA 134*  K 4.4  CL 105  CO2 20*  GLUCOSE 102*  BUN 20  CREATININE 0.88  CALCIUM 9.1  GFRNONAA >60  GFRAA >60  ANIONGAP 9    Recent Labs  Lab 05/17/18 1244  PROT 6.2*  ALBUMIN 3.2*  AST 18  ALT 7  ALKPHOS 66  BILITOT 0.4   Hematology Recent Labs  Lab 05/17/18 1244  WBC 9.2  RBC 2.27*  HGB 5.8*  HCT 21.1*  MCV 93.0  MCH 25.6*  MCHC 27.5*  RDW 18.6*  PLT 396   Cardiac EnzymesNo results for input(s): TROPONINI in the last 168 hours. No results for input(s): TROPIPOC in the last 168 hours.  BNPNo results for input(s): BNP, PROBNP in the last 168 hours.  DDimer No results for input(s): DDIMER in the last 168 hours.  Radiology/Studies:  No results found.  Assessment and Plan:   # Severe aortic stenosis s/p TAVR:  TAVR was implanted 02/2018 and she was clinically stable at her last appointment 03/2018.  Ideally she would remain on DAPT through 08/2017.  However, she has been admitted with acute GI bleed and hemoglobin is 5.8. All antiplatelets are currently on hold.    # Pre-procedural risk assessment: The patient does not have any unstable cardiac  conditions.  LHC this year showed mild, non-obstructive disease.  Upon evaluation today, she can achieve 4 METs or greater without anginal symptoms.  According to [facility] and AHA guidelines, she requires no further cardiac workup prior to her noncardiac surgery and should be at acceptable risk. Our service is available as necessary in the perioperative period.   For questions or updates, please contact [place] Please consult [email or web address] for contact info under   Signed, Skeet Latch, MD  05/17/2018 5:29 PM   Patient seen, examined. Available data reviewed. Agree with findings, assessment, and plan as outlined by Dr Oval Linsey.  Additions to the history of present illness and findings documented on the physical exam portion reflect my personal evaluation of this patient this evening.  She is known to me from undergoing TAVR in February of this year.  Her post operative course was complicated by complete heart block and asystole requiring permanent pacemaker placement.  She had fully recovered and done well until just a few days ago when she developed symptoms of a gastrointestinal bleed.  She is on dual antiplatelet therapy per protocol following TAVR, which is generally continued for at least 6 months.  The patient has received 2 units of packed red blood cells in the emergency department.  She does have some clinical exam findings consistent with acute on chronic diastolic heart  failure.  I think with the volume of packed red blood cell transfusion, I would recommend treating her with Lasix 20 mg IV x1.  Hold aspirin and clopidogrel as you are doing until definitive evaluation with upper endoscopy is performed.  I agree with documentation above that the patient is at low risk of EGD or colonoscopy.  She was noted to have patent coronary arteries with no high-grade stenoses at recent catheterization.  I am going to repeat an echocardiogram because she had elevated transvalvular gradients following TAVR  which our team suspected was a combination of anemia and patient prosthesis mismatch.  We will follow with you.  Sherren Mocha, M.D. 05/17/2018 6:23 PM

## 2018-05-17 NOTE — ED Notes (Signed)
Hospital MD at bedside

## 2018-05-17 NOTE — ED Notes (Addendum)
Nurse at bedside providing update and family ( via phone)

## 2018-05-17 NOTE — H&P (Addendum)
[facility] Admission History and Physical Service Pager: [phone]   [name] Medical record number: [id number] Date of birth: May 05, 1933 Age: 85 y.o. Gender: female  Primary Care Provider: Lyman Bishop, DO Consultants: GI, cards Code Status: Full Preferred Emergency Contact: Sherlyn Hay, son, [phone]  Chief Complaint: Dark stools  Assessment and Plan: Julie Mcgee is a 85 y.o. female presenting with a 1-2-week history of darker stools and lightheadedness, found to have a hemoglobin of 5.8 on arrival, concerning for an acute GI bleed. PMH is significant for recent TAVR in 02/2018 for severe aortic stenosis, complete heart block s/p pacemaker placement in 02/2018, HFpEF, moderate mitral valve stenosis, hypothyroidism, hypertension, hyperlipidemia.  Acute anemia, in setting of UGI bleed: Worsening. Pt reports 1-2-week history of melena with associated insidious onset of lightheadedness and shortness of breath. She has been taking aspirin and Plavix daily for recent TAVR in 02/2018. No other NSAID or Pepto-Bismol use, but has been on iron supplementation BID. Hemoglobin 5.8, from 8.7 in 03/2018. BUN 20.  FOBT positive. Clinical presentation is likely secondary to upper GI bleed with current antiplatelet therapy.  Differential including gastric/duodenual ulcers, AVM, gastritis.  However, her iron supplementation may be confounding melena presentation, thus could potentially consider a lower GI bleed. - GI consulted, considering moving forward with a EGD +/-colonoscopy likely on 5/8 - Discussed with cardiology given her cardiac history and recent interventions, recommended to proceed with lifesaving procedures and hold antiplatelet with current bleed - IV PPI - Clear diet per GI - S/p 1 U pRBC's, proceed with 1 additional unit of RBCs - F/U posttransfusion H&H, CBC in a.m. - Hold home aspirin and Plavix, place SCD's - Proceed with further  evaluation of SOB/fatigue if not improved with RBCs  Aortic severe stenosis s/p TAVR February 3244, complicated by cardiac arrest: Stable. Performed on 02/28/2018 by Dr. Roxy Manns and Dr. Burt Knack, complicated by cardiac arrest/asystole requiring brief CPR at that time.  Taking aspirin and Plavix daily at home, was supposed to continue until 08/2018.  Follows with heart care, Ms. Miguel Rota, however will be transferring to the care of Dr. Radford Pax in [place] due to location. - Holding home aspirin and Plavix in setting of above - Cardiology following, appreciate recommendations  Complete heart block, s/p pacemaker placement: Chronic, stable. Pacemaker placed in 02/2018 by Dr. Lovena Le. HR in 60s.  Follows with heart care. - EKG - Monitor telemetry  HFpEF with EF 60-65%: Chronic, stable. Appears generally euvolemic on exam, 1+ pitting edema to bilateral lower extremity that patient endorses at baseline. Believe her vague shortness of breath is likely related to anemia on presentation as above rather than acute exacerbation. Takes Lasix 20 mg "as needed" whenever she feels like she is getting more fluid, every couple of days. - Hold home Lasix while receiving PRBCs  - Blood pressure control as below  Previous CVA: Chronic, stable. In 2011, note large old bilateral cerebellar infarcts on CT head in 2019.   - Hold home aspirin for above - Continue home statin  Hypothyroidism: Chronic, stable. TSH 3.3 in 02/2018.  Takes Synthroid 125 mcg at home. - Continue home Synthroid  Hypertension: Chronic, stable. SBP 140-150s on admit.  Takes Lopressor 50 mg twice daily and Lasix 20 mg "as needed". -Monitor BP - Continue home Lopressor 50 mg twice daily  Hyperlipidemia: Chronic, stable. Takes simvastatin 80 mg daily. - Continue statin, substitute with atorvastatin formulary  Perirectal skin breakdown: Acute. Pt reporting worsening irritation around  her anal opening with continued bowel movements.   Did not appreciate any external hemorrhoids, however noted irritated skin with slight breakdown. - Barrier cream as needed  FEN/GI: Clear diet  Prophylaxis: SCDs  Disposition: Medical telemetry, awaiting further GI work-up  History of Present Illness:  Julie Mcgee is a 85 y.o. female, with a recent history TAVR for severe aortic stenosis on aspirin and Plavix, presenting with a 1-2-week history of darker stools, lightheadedness, and shortness of breath.  She endorses an insidious onset of shortness of breath, fatigue, and lightheadedness over the last few weeks. Additionally, for the last 1- 2 weeks she is noticed darker stools, however attributed this to her iron supplements, thus stopped this 2 days ago, which is been the most concerning to her rather than the SOB.  She feels she has been going to the restroom more frequently, explains it as "diarrhea", however most stools are formed.  Denies any associated nausea, vomiting, abdominal pains, hematochezia, hematuria, nosebleeds, chest pain, palpitations.  Has had a colonoscopy in the past, however is never seen a GI physician for any other reason.  Denies any associated fever, orthopnea, LE swelling above normal.    Of note, recently had a TAVR performed for severe aortic stenosis on 02/28/2018 by Dr. Roxy Manns and Dr. Burt Knack.  She has been on aspirin and Plavix since that time, was to continue Plavix until August 2020.  Endorses compliance with these medications, took her Plavix today.  Lives at home with her husband.  He does most of cooking/cleaning, however patient feels like she is able to do all of her ADLs and get around with her walker.  ED Course: On arrival, she was hemodynamically stable and in no acute distress.  Labs remarkable for hemoglobin 5.8, creatinine 0.88, CO2 20.  FOBT positive.  She was transfused 1 units of packed red blood cells while in the ED.  Additionally, GI consulted by ED physician, to evaluate at bedside.  During her  evaluation while receiving a unit of blood, says she feels "great".    Review Of Systems: Per HPI with the following additions:   Review of Systems  Constitutional: Negative for chills, fever and weight loss.  HENT: Negative for nosebleeds.   Respiratory: Positive for shortness of breath. Negative for cough, sputum production and wheezing.   Cardiovascular: Negative for chest pain, palpitations, orthopnea, leg swelling and PND.  Gastrointestinal: Positive for blood in stool and melena. Negative for abdominal pain, heartburn, nausea and vomiting.  Genitourinary: Negative for dysuria.  Musculoskeletal: Negative for falls.  Neurological: Negative for focal weakness, seizures, loss of consciousness, weakness and headaches.    Patient Active Problem List   Diagnosis Date Noted  . Complete heart block ([place]) 03/02/2018  . Sinus bradycardia   . Cardiac arrest ([place])   . 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 RBBB   . Acute on chronic diastolic heart failure ([place])   . Severe aortic stenosis 05/31/2017  . History of stroke 05/31/2017  . Unstable gait 05/31/2017  . Recurrent falls 05/31/2017  . Hypertension 05/13/2017  . Hyperlipidemia 05/13/2017  . Hypothyroidism 05/13/2017  . Arthritis 05/13/2017    Past Medical History: Past Medical History:  Diagnosis Date  . Arthritis   . Carotid artery stenosis   . Chronic diastolic CHF (congestive heart failure) ([place])   . Former smoker    1/2 ppd for 45 years  . 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  Hyperlipidemia   . Hypertension   . Hypothyroidism   . Peripheral vascular disease ([place])   . RBBB   . 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 Severe aortic stenosis     Past Surgical History: Past Surgical History:  Procedure Laterality Date  .  BASAL CELL CARCINOMA EXCISION  2018  . CARDIAC CATHETERIZATION    . COLONOSCOPY  2001  . HIP SURGERY  2005  . KNEE SURGERY  1998  . PACEMAKER IMPLANT N/A 03/02/2018   Procedure: PACEMAKER IMPLANT;  Surgeon: Evans Lance, MD;  Location: [facility];  Service: Cardiovascular;  Laterality: N/A;  . RIGHT/LEFT HEART CATH AND CORONARY ANGIOGRAPHY N/A 02/21/2018   Procedure: RIGHT/LEFT HEART CATH AND CORONARY ANGIOGRAPHY;  Surgeon: Sherren Mocha, MD;  Location: [facility];  Service: Cardiovascular;  Laterality: N/A;  . TEE WITHOUT CARDIOVERSION N/A 02/28/2018   Procedure: TRANSESOPHAGEAL ECHOCARDIOGRAM (TEE);  Surgeon: Sherren Mocha, MD;  Location: [facility];  Service: Open Heart Surgery;  Laterality: N/A;  . TEMPORARY PACEMAKER N/A 03/01/2018   Procedure: TEMPORARY PACEMAKER;  Surgeon: Burnell Blanks, MD;  Location: [facility];  Service: Cardiovascular;  Laterality: N/A;  . TONSILLECTOMY    . TRANSCATHETER AORTIC VALVE REPLACEMENT, TRANSFEMORAL N/A 02/28/2018   Procedure: TRANSCATHETER AORTIC VALVE REPLACEMENT, TRANSFEMORAL;  Surgeon: Sherren Mocha, MD;  Location: [facility];  Service: Open Heart Surgery;  Laterality: N/A;    Social History: Social History   Tobacco Use  . Smoking status: Former Smoker    Packs/day: 1.00    Years: 45.00    Pack years: 45.00    Types: Cigarettes    Last attempt to quit: 05/23/2009    Years since quitting: 8.9  . Smokeless tobacco: Never Used  Substance Use Topics  . Alcohol use: Not Currently  . Drug use: Never   Additional social history: Lives at home with her husband. Please also refer to relevant sections of EMR.  Family History: Family History  Problem Relation Age of Onset  . Heart failure Mother   . Chronic Renal Failure Mother   . Alcohol abuse Father     Allergies and Medications: Allergies  Allergen Reactions  . Adhesive [Tape] Other (See Comments)    Causes BLISTERS   No  current facility-administered medications on file prior to encounter.    Current Outpatient Medications on File Prior to Encounter  Medication Sig Dispense Refill  . acetaminophen (TYLENOL) 325 MG tablet Take 650 mg by mouth 2 (two) times daily.    [name] amoxicillin (AMOXIL) 500 MG tablet Please take 2,000 mg (4 tablets) one hour prior to all dental procedures. 8 tablet 8  . aspirin EC 81 MG tablet Take 1 tablet (81 mg total) by mouth daily.    . Calcium Carb-Cholecalciferol (CALTRATE 600+D3 PO) Take 1 tablet by mouth 2 (two) times daily.    . clopidogrel (PLAVIX) 75 MG tablet Take 1 tablet (75 mg total) by mouth daily with breakfast. 90 tablet 1  . diphenhydrAMINE (BENADRYL) 25 MG tablet Take 25 mg by mouth every 6 (six) hours as needed.    . ferrous sulfate 325 (65 FE) MG tablet Take 1 tablet (325 mg total) by mouth 2 (two) times daily with a meal. 90 tablet 2  . furosemide (LASIX) 20 MG tablet Take 1 tablet (20 mg total) by mouth daily. [place]  tablet 3  . levothyroxine (SYNTHROID, LEVOTHROID) 125 MCG tablet Take 125 mcg by mouth daily before breakfast.    . Melatonin 5 MG CAPS Take 5 mg by mouth at bedtime.    . metoprolol tartrate (LOPRESSOR) 50 MG tablet Take 50 mg by mouth 2 (two) times daily.    . Multiple Vitamins-Minerals (MULTIVITAMIN ADULTS 50+) TABS Take 1 tablet by mouth daily.    . simvastatin (ZOCOR) 80 MG tablet Take 80 mg by mouth every evening.      Objective: BP (!) 139/44   Pulse 64   Temp 98.4 F (36.9 C)   Resp 17   Ht 5\' 1"  (1.549 m)   Wt 89.3 kg   SpO2 99%   BMI 37.20 kg/m  Exam: General: Alert, older female sitting up comfortably in no acute distress HEENT: NCAT, MM lightly dry Cardiac: Regular rate and rhythm, 2/6 systolic murmur noted Lungs: Clear bilaterally without any increased work of breathing on RA, did not appreciate any crackles or wheezing. Abdomen: soft, non-tender, non-distended, normoactive BS Msk: Moves all extremities spontaneously  Ext: Warm,  dry, 2+ distal pulses, +1 pitting edema to bilateral knees Neuro: No focal neuro deficits noted, alert and oriented GU: Surrounding anal opening, irritated/erythematous skin that is tender to palpation.  No open wounds or warmth to touch.  No external hemorrhoids appreciated. Psych: Appropriate affect  Labs and Imaging: CBC BMET  Recent Labs  Lab 05/17/18 1244  WBC 9.2  HGB 5.8*  HCT 21.1*  PLT 396   Recent Labs  Lab 05/17/18 1244  NA 134*  K 4.4  CL 105  CO2 20*  BUN 20  CREATININE 0.88  GLUCOSE 102*  CALCIUM 9.1     No results found.  Patriciaann Clan, DO 05/17/2018, 4:15 PM PGY-1, [place] Intern pager: [phone], text pages welcome  FPTS Upper-Level Resident Addendum   I have independently interviewed and examined the patient. I have discussed the above with the original author and agree with their documentation. My edits for correction/addition/clarification are in purple. Please see also any attending notes.    Martinique Hutton Pellicane, DO PGY-2, [facility] 05/17/2018 6:42 PM  [place] Service pager: [phone] (text pages welcome through [facility])

## 2018-05-18 ENCOUNTER — Inpatient Hospital Stay (HOSPITAL_COMMUNITY): Payer: Medicare Other

## 2018-05-18 DIAGNOSIS — I361 Nonrheumatic tricuspid (valve) insufficiency: Secondary | ICD-10-CM

## 2018-05-18 DIAGNOSIS — K922 Gastrointestinal hemorrhage, unspecified: Secondary | ICD-10-CM

## 2018-05-18 DIAGNOSIS — I371 Nonrheumatic pulmonary valve insufficiency: Secondary | ICD-10-CM

## 2018-05-18 DIAGNOSIS — Z7901 Long term (current) use of anticoagulants: Secondary | ICD-10-CM

## 2018-05-18 LAB — CBC
HCT: 24.1 % — ABNORMAL LOW (ref 36.0–46.0)
Hemoglobin: 7.5 g/dL — ABNORMAL LOW (ref 12.0–15.0)
MCH: 26.9 pg (ref 26.0–34.0)
MCHC: 31.1 g/dL (ref 30.0–36.0)
MCV: 86.4 fL (ref 80.0–100.0)
Platelets: 298 10*3/uL (ref 150–400)
RBC: 2.79 MIL/uL — ABNORMAL LOW (ref 3.87–5.11)
RDW: 18.6 % — ABNORMAL HIGH (ref 11.5–15.5)
WBC: 8.7 10*3/uL (ref 4.0–10.5)
nRBC: 0 % (ref 0.0–0.2)

## 2018-05-18 LAB — TYPE AND SCREEN
ABO/RH(D): A POS
Antibody Screen: NEGATIVE
Unit division: 0
Unit division: 0

## 2018-05-18 LAB — BASIC METABOLIC PANEL
Anion gap: 11 (ref 5–15)
BUN: 15 mg/dL (ref 8–23)
CO2: 24 mmol/L (ref 22–32)
Calcium: 8.9 mg/dL (ref 8.9–10.3)
Chloride: 104 mmol/L (ref 98–111)
Creatinine, Ser: 0.85 mg/dL (ref 0.44–1.00)
GFR calc Af Amer: >60 mL/min (ref >60)
GFR calc non Af Amer: >60 mL/min (ref >60)
Glucose, Bld: 91 mg/dL (ref 70–99)
Potassium: 4.9 mmol/L (ref 3.5–5.1)
Sodium: 139 mmol/L (ref 135–145)

## 2018-05-18 LAB — BPAM RBC
Blood Product Expiration Date: 202005142359
Blood Product Expiration Date: 202005142359
ISSUE DATE / TIME: 202005061437
ISSUE DATE / TIME: 202005061651
Unit Type and Rh: 6200
Unit Type and Rh: 6200

## 2018-05-18 LAB — ECHOCARDIOGRAM LIMITED
Height: 61 in
Weight: 2984.15 oz

## 2018-05-18 MED ORDER — PEG-KCL-NACL-NASULF-NA ASC-C 100 G PO SOLR: 1.0000 | Freq: Once | ORAL | Status: DC

## 2018-05-18 MED ORDER — PEG-KCL-NACL-NASULF-NA ASC-C 100 G PO SOLR
0.5000 | Freq: Once | ORAL | Status: AC
  Administered 2018-05-18: 100 g via ORAL
  Filled 2018-05-18: qty 1

## 2018-05-18 MED ORDER — PEG-KCL-NACL-NASULF-NA ASC-C 100 G PO SOLR
1.0000 | Freq: Once | ORAL | Status: AC
  Administered 2018-05-18: 200 g via ORAL
  Filled 2018-05-18: qty 1

## 2018-05-18 NOTE — Progress Notes (Signed)
Family Medicine Teaching Service Daily Progress Note Intern Pager: [phone]  [name] Medical record number: [id number] Date of birth: [birth year] Age: 85 y.o. Gender: female  Primary Care Provider: Lyman Bishop, DO Consultants: GI, Cards  Code Status: Full  Assessment and Plan: Julie Mcgee is a 85 y.o. female presenting with a 1-2-week history of darker stools and lightheadedness, found to have a hemoglobin of 5.8 on arrival, concerning for an acute GI bleed. PMH is significant for recent TAVR in 02/2018 for severe aortic stenosis, complete heart block s/p pacemaker placement in 02/2018, HFpEF, moderate mitral valve stenosis, hypothyroidism, hypertension, hyperlipidemia.  Acute anemia, in setting of UGI bleed:  Improving. Hgb 7.5 s/p 2 U pRBCs yesterday. Goal ideally would be >8 given cardiac history, however patient has chronically been around 7 with GI recommending goal of 7 and cardiology on board. Symptomatic improvement of lightheadedness and SOB.  Holding home aspirin and Plavix. Differential including gastric/duodenual ulcers, AVM, gastritis, vs lower GI given recent iron supplementation may be confounding presentation. -GI on board, likely moving forward with EGD +/-colonoscopy tomorrow -Clear diet per GI -Continue holding home aspirin and Plavix, SCDs in place --Monitor CBC   Aortic severe stenosis s/p TAVR February 1740, complicated by cardiac arrest: Stable. Cardiology on board, repeat echo this morning showing post TAVR 3 gradients higher than expected for immediate post implant.  Previously taking aspirin and Plavix, especially continue till 08/2018.  Follows with heart care.   -Follow-up cardiology recommendations -Holding home aspirin and Plavix in setting of above  Mild acute on chronic HFpEF with EF 60-65%: Chronic, stable. Increased orthopnea overnight due to increased volume with blood transfusion.  Received Lasix 20 mg IV once with good diuresis,  -1.4L.  Asymptomatic this a.m., lungs generally clear with 1+ pitting edema to LE at her baseline.  Takes Lasix 20 mg "as needed "at home, prescribed as daily.   -Cardiology on board, appreciate recommendations -Start home Lasix dose 20mg  daily  -Blood pressure control as below  Complete heart block, s/p pacemaker placement: Chronic, stable. Pacemaker placed in 02/2018 by Dr. Lovena Le. HR in 60s.  - Monitor telemetry  Previous CVA: Chronic, stable. In 2011, note large old bilateral cerebellar infarcts on CT head in 2019.   - Hold home aspirin for above - Continue home statin  Hypothyroidism: Chronic, stable. TSH 3.3 in 02/2018.  Takes Synthroid 125 mcg at home. - Continue home Synthroid  Hypertension: Chronic, stable. SBP 130-140s.  -Monitor BP - Continue home Lopressor 50 mg twice daily  Hyperlipidemia: Chronic, stable. Takes simvastatin 80 mg daily. - Continue statin, substitute with atorvastatin formulary  Perirectal skin breakdown: Acute, improving. Symptomatic improvement this morning.  - Barrier cream as needed - Continue to monitor  FEN/GI: Clear diet  Prophylaxis: SCDs  Disposition:  Awaiting further GI work-up  Subjective:  Doing well this morning, denies any continued lightheadedness or shortness of breath.  Had some orthopnea last night, resolved after Lasix.  Denies any abdominal pain, bowel movements.  Slept well.  Objective: Temp:  [97.7 F (36.5 C)-98.9 F (37.2 C)] 98.5 F (36.9 C) (05/07 0438) Pulse Rate:  [52-68] 62 (05/07 0438) Resp:  [12-25] 20 (05/07 0438) BP: (122-160)/(44-92) 132/49 (05/07 0438) SpO2:  [95 %-100 %] 95 % (05/07 0438) Weight:  [84.6 kg-89.3 kg] 84.6 kg (05/06 1957) Physical Exam: General: Alert, NAD, pleasant older female HEENT: NCAT, MMM  Cardiac: RRR with 2/6 systolic murmur Lungs: Clear bilaterally with slight crackle in left lower lobe, no  increased work of breathing laying at 20% incline, on RA Abdomen: soft,  non-tender, non-distended, normoactive BS Msk: Moves all extremities spontaneously  Ext: Warm, dry, 2+ distal pulses, 1+ pitting edema to midshin bilaterally    Laboratory: Recent Labs  Lab 05/17/18 1244 05/17/18 2203 05/18/18 0431  WBC 9.2  --  8.7  HGB 5.8* 7.2* 7.5*  HCT 21.1* 23.8* 24.1*  PLT 396  --  298   Recent Labs  Lab 05/17/18 1244 05/18/18 0431  NA 134* 139  K 4.4 4.9  CL 105 104  CO2 20* 24  BUN 20 15  CREATININE 0.88 0.85  CALCIUM 9.1 8.9  PROT 6.2*  --   BILITOT 0.4  --   ALKPHOS 66  --   ALT 7  --   AST 18  --   GLUCOSE 102* 91    Imaging/Diagnostic Tests: No results found.  Patriciaann Clan, DO 05/18/2018, 7:02 AM PGY-1, [place] Intern pager: [phone], text pages welcome

## 2018-05-18 NOTE — Progress Notes (Signed)
  Echocardiogram 2D Echocardiogram has been performed.  Jennette Dubin 05/18/2018, 8:45 AM

## 2018-05-18 NOTE — Progress Notes (Signed)
Patient ID: Julie Mcgee, female   DOB: 1933/02/27, 85 y.o.   MRN: [id number]    Progress Note   Subjective  Pt  feels fine- just had liquid lunch , hungry. No c/o abdominal discomfort and no diarrhea or melena since admit. hgb up to 7.5 after 2 units yesterday Plavix and ASA on hold- Ok'd by cardiology   Objective   Vital signs in last 24 hours: Temp:  [97.7 F (36.5 C)-98.9 F (37.2 C)] 98.4 F (36.9 C) (05/07 0914) Pulse Rate:  [52-68] 65 (05/07 0914) Resp:  [12-25] 20 (05/07 0914) BP: (122-160)/(44-92) 142/59 (05/07 0914) SpO2:  [95 %-100 %] 98 % (05/07 0914) Weight:  [84.6 kg-89.3 kg] 84.6 kg (05/06 1957) Last BM Date: 05/17/18 General:  Elderly   white female in NAD, mild dysarthria Heart:  Regular rate and rhythm; no murmurs Lungs: Respirations even and unlabored, lungs CTA bilaterally Abdomen:  Soft, nontender and nondistended. Normal bowel sounds. Extremities:  Without edema. Neurologic:  Alert and oriented,  Right sided hemiparesis Psych:  Cooperative. Normal mood and affect.  Intake/Output from previous day: 05/06 0701 - 05/07 0700 In: 10 [I.V.:10] Out: 1500 [Urine:1500] Intake/Output this shift: Total I/O In: 300 [P.O.:300] Out: 150 [Urine:150]  Lab Results: Recent Labs    05/17/18 1244 05/17/18 2203 05/18/18 0431  WBC 9.2  --  8.7  HGB 5.8* 7.2* 7.5*  HCT 21.1* 23.8* 24.1*  PLT 396  --  298   BMET Recent Labs    05/17/18 1244 05/18/18 0431  NA 134* 139  K 4.4 4.9  CL 105 104  CO2 20* 24  GLUCOSE 102* 91  BUN 20 15  CREATININE 0.88 0.85  CALCIUM 9.1 8.9   LFT Recent Labs    05/17/18 1244  PROT 6.2*  ALBUMIN 3.2*  AST 18  ALT 7  ALKPHOS 66  BILITOT 0.4   PT/INR Recent Labs    05/17/18 1244  LABPROT 14.9  INR 1.2        Assessment / Plan:    #1 85 yo WF admitted with profound anemia, and hx of diarrhea/melena over the past 1-2 weeks  Etiology of bleeding not clear but higher suspicion for Upper GI source with recent  addition of ASA and Plavix  Pt has been stable since admit, no active bleeding. Hgb responded appropriately to transfusion x 2  #2 s/p recent TAVR 2/20 #3 s/p PPM 2/20 #4 hx complete heart block #5 Hx CVA with right hemipareis #6 PVD  Plan; Continue clears today Bowel prep this evening Have scheduled for EGD and Colonoscopy with Dr Bryan Lemma in am tomorrow. Procedures were discussed in detail with pt including indications,  Risks and benefits. Hold ASA and Plavix until post procedures tomorrow Continue BID PPI for now       Active Problems:   Acute on chronic diastolic (congestive) heart failure (HCC)   H/O aortic valve replacement   Acute upper GI bleed     LOS: 1 day   Astra Gregg  05/18/2018, 11:46 AM

## 2018-05-18 NOTE — Progress Notes (Signed)
Progress Note  [name] Date of Encounter: 05/18/2018  Primary Cardiologist: Jenean Lindau, MD  Interventionalist/TAVR: Dr. Sherren Mocha Primary Electrophysiologist: Dr. Lovena Le  Subjective   85 yo with TAVR, presented with a significant GI bleed and anemia  Received 2 units prbc DAPT agents are on hold  She is NPO Hoping for EGD today  No CP , no dyspnea   Inpatient Medications    Scheduled Meds:  atorvastatin  40 mg Oral q1800   levothyroxine  125 mcg Oral QAC breakfast   metoprolol tartrate  50 mg Oral BID   pantoprazole (PROTONIX) IV  40 mg Intravenous Q12H   Continuous Infusions:  PRN Meds: acetaminophen **OR** acetaminophen, barrier cream   Vital Signs    Vitals:   05/17/18 1800 05/17/18 1846 05/17/18 1957 05/18/18 0438  BP: (!) 143/56 (!) 144/59 (!) 143/60 (!) 132/49  Pulse: 64 64 66 62  Resp: (!) 24 (!) 22 (!) 22 20  Temp:  98.8 F (37.1 C) 98.9 F (37.2 C) 98.5 F (36.9 C)  TempSrc:   Oral Oral  SpO2: 99% 100% 96% 95%  Weight:   84.6 kg   Height:        Intake/Output Summary (Last 24 hours) at 05/18/2018 0754 Last data filed at 05/18/2018 0600 Gross per 24 hour  Intake 10 ml  Output 1500 ml  Net -1490 ml   Last 3 Weights 05/17/2018 05/17/2018 03/16/2018  Weight (lbs) 186 lb 8.2 oz 196 lb 13.9 oz 196 lb 12.8 oz  Weight (kg) 84.6 kg 89.3 kg 89.268 kg      Telemetry    NSR  - Personally Reviewed  ECG    No new ECG tracing today. - Personally Reviewed  Physical Exam    Physical Exam: Blood pressure (!) 142/59, pulse 65, temperature 98.4 F (36.9 C), temperature source Oral, resp. rate 20, height 5\' 1"  (1.549 m), weight 84.6 kg, SpO2 98 %.  GEN:   Elderly female,   HEENT: Normal NECK: No JVD; No carotid bruits LYMPHATICS: No lymphadenopathy CARDIAC: RRR . Systolic murmur  RESPIRATORY:  Clear to auscultation without rales, wheezing or rhonchi  ABDOMEN: Soft, non-tender, non-distended MUSCULOSKELETAL:  No edema; No  deformity  SKIN: Warm and dry NEUROLOGIC:  Slight dysarthria from CVA , motor intact    Labs    Chemistry Recent Labs  Lab 05/17/18 1244 05/18/18 0431  NA 134* 139  K 4.4 4.9  CL 105 104  CO2 20* 24  GLUCOSE 102* 91  BUN 20 15  CREATININE 0.88 0.85  CALCIUM 9.1 8.9  PROT 6.2*  --   ALBUMIN 3.2*  --   AST 18  --   ALT 7  --   ALKPHOS 66  --   BILITOT 0.4  --   GFRNONAA >60 >60  GFRAA >60 >60  ANIONGAP 9 11     Hematology Recent Labs  Lab 05/17/18 1244 05/17/18 2203 05/18/18 0431  WBC 9.2  --  8.7  RBC 2.27*  --  2.79*  HGB 5.8* 7.2* 7.5*  HCT 21.1* 23.8* 24.1*  MCV 93.0  --  86.4  MCH 25.6*  --  26.9  MCHC 27.5*  --  31.1  RDW 18.6*  --  18.6*  PLT 396  --  298    Cardiac EnzymesNo results for input(s): TROPONINI in the last 168 hours. No results for input(s): TROPIPOC in the last 168 hours.   BNPNo results for input(s): BNP, PROBNP in the  last 168 hours.   DDimer No results for input(s): DDIMER in the last 168 hours.   Radiology    No results found.  Cardiac Studies   Echocardiogram 03/01/2018:  1. The left ventricle has normal systolic function, with an ejection fraction of 60-65%. The cavity size was normal. There is moderately increased left ventricular wall thickness.  2. Left atrial size was severely dilated.  3. Mildly thickened tricuspid valve leaflets.  4. The mitral valve is rheumatic. Severe thickening of the mitral valve leaflet. Severe calcification of the mitral valve leaflet. There is severe mitral annular calcification present. Moderate mitral valve stenosis.  5. The tricuspid valve was normal in structure. Tricuspid valve regurgitation is moderate.  6. Post TAVR with 23 mm Sapien 3 valve Not well visualized no PVL gradients have increased considerably since implant 02/28/18. _______________  Right/Left Heart Catheterization 02/21/2018: 1.  Mild to moderate focal proximal LAD stenosis at the origin of the first diagonal branch, does  not appear flow obstructive 2.  Severe calcific aortic stenosis with a peak to peak gradient of 60 mmHg, mean gradient 48 mmHg, and calculated valve area 0.96 cm. 3.  Severe mitral annular calcification biplane fluoroscopy with moderate mitral stenosis present, mean gradient 10 mmHg, calculated valve area 1.7 cm 4.  Normal LVEDP, mild pulmonary hypertension, and mildly elevated right heart pressures  Recommendations: Medical therapy for nonobstructive CAD, continued evaluation for consideration of TAVR and treatment of this patient's valvular heart disease.  Anticipate moving forward with CTA studies of the heart and chest/abdomen/pelvis followed by cardiac surgical consultation as part of a multidisciplinary evaluation.  Patient Profile     Julie Mcgee is a 85 y.o. female with a history of severe aortic stenosis s/p TAVR in 04/2593 complicated by complete heart block s/p PPM, chronic diastolic heart failure, hypertension, hyperlipidemia, RBBB, CVA with residual R hemiparesis, and carotid stenosis who was admitted for an acute GI bleed with hemoglobin of 5.8. Cardiology consulted for pre-operative evaluation and assistance with antiplatelets at the request of Dr. Higinio Plan .  Assessment & Plan    Severe Aortic Stenosis s/p TAVR TAVR was implanted 02/2018 and she was clinically stable at her last appointment 03/2018.  Ideally she would remain on DAPT through 08/2017.  However, she has been admitted with acute GI bleed and hemoglobin is 5.8. All antiplatelets are currently on hold. Dr. Burt Knack reviewed and is OK with holding Aspirin and Plavix until definitive evaluation with endoscopy is performed.  Pre-Operative Evaluation Patient does not have any unstable cardiac conditions.  LHC this year showed mild, non-obstructive disease.  Upon evaluation today, she can achieve 4 METs or greater without anginal symptoms.  According to [facility] and AHA guidelines, she requires no further cardiac workup prior to her  noncardiac surgery and should be at acceptable risk.  Acute on Chronic CHF Patient had some clinical exam findings consistent with acute on chronic diastolic CHF yesterday and was given one dose of Lasix 20mg  daily given volume of PRBC transfusion. Documented urine output of 1.5 L in the past 24 hours. Dr. Burt Knack also ordered repeat Echo to re-evaluate transvalvular gradient which were elevated following TAVR (suspected combination of anemia and patient prosthesis mismatch). Will defer additional diuresis (if any) to MD will assess volume status.   Acute GI Bleed - Patient received 2 units of PRBCs yesterday. - Hemoglobin 7.5 today. - Management per GI.  For questions or updates, please contact [place] Please consult [email or web address] for contact info under  Signed, Darreld Mclean, PA-C  05/18/2018, 7:54 AM   .  Attending Note:   The patient was seen and examined.  Agree with assessment and plan as noted above.  Changes made to the above note as needed.  Patient seen and independently examined with Sande Rives, PA .   We discussed all aspects of the encounter. I agree with the assessment and plan as stated above.  1.   GI bleed with anemia :  Likely due to DAPT.  plavix and ASA are on hold Will continue to hold until we have a better idea of where she is bleeding Will await recs from GI .   2. TAVR:   Valve sounds normal Echo shows normal EF of 60-65%.   Gradients slightly higher than we would expect ( peak AV gradient of 36 with mean gradient of 19 mmHg)    I have spent a total of 40 minutes with patient reviewing hospital  notes , telemetry, EKGs, labs and examining patient as well as establishing an assessment and plan that was discussed with the patient. > 50% of time was spent in direct patient care.    Thayer Headings, Brooke Bonito., MD, [facility] 05/18/2018, 10:29 AM [address],  [place] Pager [phone]

## 2018-05-18 NOTE — Progress Notes (Signed)
S) Pt received 2 units packed RBCs.  Posttransfusion hemoglobin 7.2.  Went to bedside to assess fluid status.  She reports that she feels comfortable but does note mild shortness of breath when lying flat.  This shortness of breath appears to be new since receiving 2 units of blood.  O) BP (!) 143/60 (BP Location: Right Arm)   Pulse 66   Temp 98.9 F (37.2 C) (Oral)   Resp (!) 22   Ht 5\' 1"  (1.549 m)   Wt 89.3 kg   SpO2 96%   BMI 37.20 kg/m   Gen: Lying flat in bed with 1 pillow upon entering the room.  She did have mild difficulty completing sentences while lying flat.  On sitting up, she is able to talk comfortably without issue. HEENT: JVD noted to the angle of the jaw. Palm: Rales noted in lower fields bilaterally Cardiac: Regular rate and rhythm, strong radial pulse Extremities: 2+ pitting edema noted to the mid shin.  A/P) mildly fluid overloaded following 2 units of packed RBCs.  History of heart failure.  IV Lasix 20 mg given at 2200. -Allow time for Lasix diuresis -Assess fluid status in a.m. -Consider 1 additional unit PRBC following a.m. exam

## 2018-05-19 ENCOUNTER — Inpatient Hospital Stay (HOSPITAL_COMMUNITY): Payer: Medicare Other | Admitting: Anesthesiology

## 2018-05-19 ENCOUNTER — Encounter (HOSPITAL_COMMUNITY)
Admission: EM | Disposition: A | Discharge status: Home Health | Payer: Self-pay | Source: Home / Self Care | Attending: Family Medicine

## 2018-05-19 ENCOUNTER — Encounter (HOSPITAL_COMMUNITY): Payer: Self-pay

## 2018-05-19 DIAGNOSIS — K449 Diaphragmatic hernia without obstruction or gangrene: Secondary | ICD-10-CM

## 2018-05-19 DIAGNOSIS — K6389 Other specified diseases of intestine: Secondary | ICD-10-CM

## 2018-05-19 DIAGNOSIS — K317 Polyp of stomach and duodenum: Secondary | ICD-10-CM

## 2018-05-19 DIAGNOSIS — K621 Rectal polyp: Secondary | ICD-10-CM

## 2018-05-19 DIAGNOSIS — D490 Neoplasm of unspecified behavior of digestive system: Secondary | ICD-10-CM

## 2018-05-19 HISTORY — PX: ESOPHAGOGASTRODUODENOSCOPY (EGD) WITH PROPOFOL: SHX5813

## 2018-05-19 HISTORY — PX: COLONOSCOPY WITH PROPOFOL: SHX5780

## 2018-05-19 HISTORY — PX: BIOPSY: SHX5522

## 2018-05-19 HISTORY — PX: SUBMUCOSAL TATTOO INJECTION: SHX6856

## 2018-05-19 LAB — COMPREHENSIVE METABOLIC PANEL
ALT: 9 U/L (ref 0–44)
AST: 16 U/L (ref 15–41)
Albumin: 2.9 g/dL — ABNORMAL LOW (ref 3.5–5.0)
Alkaline Phosphatase: 64 U/L (ref 38–126)
Anion gap: 11 (ref 5–15)
BUN: 11 mg/dL (ref 8–23)
CO2: 23 mmol/L (ref 22–32)
Calcium: 8.8 mg/dL — ABNORMAL LOW (ref 8.9–10.3)
Chloride: 106 mmol/L (ref 98–111)
Creatinine, Ser: 0.85 mg/dL (ref 0.44–1.00)
GFR calc Af Amer: >60 mL/min (ref >60)
GFR calc non Af Amer: >60 mL/min (ref >60)
Glucose, Bld: 92 mg/dL (ref 70–99)
Potassium: 3.8 mmol/L (ref 3.5–5.1)
Sodium: 140 mmol/L (ref 135–145)
Total Bilirubin: 1.1 mg/dL (ref 0.3–1.2)
Total Protein: 5.4 g/dL — ABNORMAL LOW (ref 6.5–8.1)

## 2018-05-19 LAB — CBC
HCT: 24.8 % — ABNORMAL LOW (ref 36.0–46.0)
HCT: 24.5 % — ABNORMAL LOW (ref 36.0–46.0)
Hemoglobin: 7.5 g/dL — ABNORMAL LOW (ref 12.0–15.0)
Hemoglobin: 7.5 g/dL — ABNORMAL LOW (ref 12.0–15.0)
MCH: 26.3 pg (ref 26.0–34.0)
MCH: 26.7 pg (ref 26.0–34.0)
MCHC: 30.2 g/dL (ref 30.0–36.0)
MCHC: 30.6 g/dL (ref 30.0–36.0)
MCV: 87 fL (ref 80.0–100.0)
MCV: 87.2 fL (ref 80.0–100.0)
Platelets: 388 10*3/uL (ref 150–400)
Platelets: 379 10*3/uL (ref 150–400)
RBC: 2.85 MIL/uL — ABNORMAL LOW (ref 3.87–5.11)
RBC: 2.81 MIL/uL — ABNORMAL LOW (ref 3.87–5.11)
RDW: 18.8 % — ABNORMAL HIGH (ref 11.5–15.5)
RDW: 18.7 % — ABNORMAL HIGH (ref 11.5–15.5)
WBC: 8.9 10*3/uL (ref 4.0–10.5)
WBC: 17.9 10*3/uL — ABNORMAL HIGH (ref 4.0–10.5)
nRBC: 0 % (ref 0.0–0.2)
nRBC: 0 % (ref 0.0–0.2)

## 2018-05-19 SURGERY — ESOPHAGOGASTRODUODENOSCOPY (EGD) WITH PROPOFOL
Anesthesia: Monitor Anesthesia Care

## 2018-05-19 MED ORDER — LACTATED RINGERS IV SOLN
INTRAVENOUS | Status: DC
  Administered 2018-05-19: 09:00:00 via INTRAVENOUS

## 2018-05-19 MED ORDER — PROPOFOL 10 MG/ML IV BOLUS
INTRAVENOUS | Status: DC | PRN
  Administered 2018-05-19: 20 mg via INTRAVENOUS
  Administered 2018-05-19: 30 mg via INTRAVENOUS

## 2018-05-19 MED ORDER — LIDOCAINE 2% (20 MG/ML) 5 ML SYRINGE
INTRAMUSCULAR | Status: DC | PRN
  Administered 2018-05-19: 60 mg via INTRAVENOUS

## 2018-05-19 MED ORDER — PANTOPRAZOLE SODIUM 40 MG PO TBEC
40.0000 mg | DELAYED_RELEASE_TABLET | Freq: Every day | ORAL | Status: DC
  Administered 2018-05-19 – 2018-05-28 (×9): 40 mg via ORAL
  Filled 2018-05-19 (×9): qty 1

## 2018-05-19 MED ORDER — SPOT INK MARKER SYRINGE KIT
PACK | SUBMUCOSAL | Status: DC | PRN
  Administered 2018-05-19: 10 mL via SUBMUCOSAL

## 2018-05-19 MED ORDER — PROPOFOL 500 MG/50ML IV EMUL
INTRAVENOUS | Status: DC | PRN
  Administered 2018-05-19: 75 ug/kg/min via INTRAVENOUS

## 2018-05-19 MED ORDER — SPOT INK MARKER SYRINGE KIT
PACK | SUBMUCOSAL | Status: AC
  Filled 2018-05-19: qty 10

## 2018-05-19 SURGICAL SUPPLY — 24 items

## 2018-05-19 NOTE — Transfer of Care (Signed)
Immediate Anesthesia Transfer of Care Note  Patient: Julie Mcgee  Procedure(s) Performed: ESOPHAGOGASTRODUODENOSCOPY (EGD) WITH PROPOFOL (N/A ) COLONOSCOPY WITH PROPOFOL (N/A ) BIOPSY SUBMUCOSAL TATTOO INJECTION  Patient Location: Endoscopy Unit  Anesthesia Type:MAC  Level of Consciousness: awake, drowsy and patient cooperative  Airway & Oxygen Therapy: Patient Spontanous Breathing and Patient connected to nasal cannula oxygen  Post-op Assessment: Report given to RN and Post -op Vital signs reviewed and stable  Post vital signs: Reviewed and stable  Last Vitals:  Vitals Value Taken Time  BP 138/41 05/19/2018 11:28 AM  Temp    Pulse 65 05/19/2018 11:29 AM  Resp 24 05/19/2018 11:29 AM  SpO2 98 % 05/19/2018 11:29 AM  Vitals shown include unvalidated device data.  Last Pain:  Vitals:   05/19/18 0848  TempSrc: Oral  PainSc: 0-No pain         Complications: No apparent anesthesia complications

## 2018-05-19 NOTE — Anesthesia Postprocedure Evaluation (Signed)
Anesthesia Post Note  Patient: Julie Mcgee  Procedure(s) Performed: ESOPHAGOGASTRODUODENOSCOPY (EGD) WITH PROPOFOL (N/A ) COLONOSCOPY WITH PROPOFOL (N/A ) BIOPSY SUBMUCOSAL TATTOO INJECTION     Patient location during evaluation: PACU Anesthesia Type: MAC Level of consciousness: awake and alert Pain management: pain level controlled Vital Signs Assessment: post-procedure vital signs reviewed and stable Respiratory status: spontaneous breathing, nonlabored ventilation, respiratory function stable and patient connected to nasal cannula oxygen Cardiovascular status: stable and blood pressure returned to baseline Postop Assessment: no apparent nausea or vomiting Anesthetic complications: no    Last Vitals:  Vitals:   05/19/18 1140 05/19/18 1150  BP: (!) 140/34 (!) 135/37  Pulse: 62 61  Resp: 19 (!) 21  Temp:    SpO2: 97% 95%    Last Pain:  Vitals:   05/19/18 1150  TempSrc:   PainSc: 0-No pain                 Violeta Lecount

## 2018-05-19 NOTE — Progress Notes (Signed)
Family Medicine Teaching Service Daily Progress Note Intern Pager: [phone]  [name] Medical record number: [id number] Date of birth: 09/09/1933 Age: 85 y.o. Gender: female Primary Care Provider: Lyman Bishop, DO Consultants: GI, Cards  Code Status: Full  Assessment and Plan: Jacarra Bobak is a 85 y.o. female presenting with a 1-2-week history of darker stools and lightheadedness, found to have a hemoglobin of 5.8 on arrival, concerning for an acute GI bleed. PMH is significant for recent TAVR in 02/2018 for severe aortic stenosis, complete heart block s/p pacemaker placement in 02/2018, HFpEF, moderate mitral valve stenosis, hypothyroidism, hypertension, hyperlipidemia.  Acute anemia, in setting of UGI bleed:  Stable. Now asymptomatic.  Hgb stable at 7.5.  Undergoing EGD and colonoscopy today per GI.   -GI on board, scope today, 5/8 -Continue holding home aspirin and Plavix, SCDs in place --Monitor CBC   Aortic severe stenosis s/p TAVR February 0277, complicated by cardiac arrest: Stable. Cardiology on board, repeat echo this morning showing post TAVR 3 gradients higher than expected for immediate post implant.  Previously taking aspirin and Plavix, especially continue till 08/2018.  Follows with heart care.   -Follow-up cardiology recommendations -Holding home aspirin and Plavix in setting of above  Mild acute on chronic HFpEF with EF 60-65%:  Resolved. Appears euvolemic.  Likely in the setting of transfusions. -Cardiology on board, appreciate recommendations -Continue home Lasix 20 mg daily -Blood pressure control as below  Complete heart block, s/p pacemaker placement: Chronic, stable. Pacemaker placed in 02/2018 by Dr. Lovena Le. HR in 60s.  - Monitor telemetry  Previous CVA: Chronic, stable. In 2011, note large old bilateral cerebellar infarcts on CT head in 2019.   - Hold home aspirin for above - Continue home statin  Hypothyroidism: Chronic,  stable. TSH 3.3 in 02/2018.  Takes Synthroid 125 mcg at home. - Continue home Synthroid  Hypertension: Chronic, stable. SBP 140-160. -Monitor BP - Continue home Lopressor 50 mg twice daily  Hyperlipidemia: Chronic, stable. Takes simvastatin 80 mg daily. - Continue statin, substitute with atorvastatin formulary  Perirectal skin breakdown: Acute, improving. - Barrier cream as needed - Continue to monitor  FEN/GI:  N.p.o., undergoing scope today Prophylaxis: SCDs  Disposition:  Awaiting further GI work-up  Subjective:  Doing well this morning, says the bowel regiment worked very well last night she had numerous bowel movements.  Feels all cleared out and ready for her EGD/colonoscopy today.  Denies any lightheadedness, dizziness, shortness of breath, worsening edema.  Objective: Temp:  [98.3 F (36.8 C)-98.6 F (37 C)] 98.3 F (36.8 C) (05/08 0502) Pulse Rate:  [60-65] 64 (05/08 0502) Resp:  [19-20] 20 (05/08 0502) BP: (137-142)/(48-67) 137/67 (05/08 0502) SpO2:  [97 %-98 %] 97 % (05/08 0502) Weight:  [85 kg] 85 kg (05/07 2112) Physical Exam: General: Alert, NAD, pleasant older female HEENT: NCAT, MMM, oropharynx nonerythematous  Cardiac: RRR no m/g/r Lungs: Clear bilaterally with no increased work of breathing laying flat on room air satting well Abdomen: soft, non-tender, non-distended, normoactive BS Msk: Moves all extremities spontaneously  Ext: Warm, dry, 2+ distal pulses, 1+ pitting edema to ankle bilaterally   Laboratory: Recent Labs  Lab 05/17/18 1244 05/17/18 2203 05/18/18 0431 05/19/18 0308  WBC 9.2  --  8.7 8.9  HGB 5.8* 7.2* 7.5* 7.5*  HCT 21.1* 23.8* 24.1* 24.8*  PLT 396  --  298 388   Recent Labs  Lab 05/17/18 1244 05/18/18 0431 05/19/18 0308  NA 134* 139 140  K 4.4 4.9  3.8  CL 105 104 106  CO2 20* 24 23  BUN 20 15 11   CREATININE 0.88 0.85 0.85  CALCIUM 9.1 8.9 8.8*  PROT 6.2*  --  5.4*  BILITOT 0.4  --  1.1  ALKPHOS 66  --  64   ALT 7  --  9  AST 18  --  16  GLUCOSE 102* 91 92    Imaging/Diagnostic Tests: No results found.  Patriciaann Clan, DO 05/19/2018, 7:31 AM PGY-1, [place] Intern pager: [phone], text pages welcome

## 2018-05-19 NOTE — Op Note (Signed)
[facility] [name] Procedure Date : 05/19/2018 MRN: [id number] Attending MD: Gerrit Heck , MD Date of Birth: [birth year] CSN: [id number] Age: 85 Admit Type: Inpatient Procedure:                Colonoscopy Indications:              Iron deficiency anemia secondary to chronic blood                            loss Providers:                Gerrit Heck, MD, Raynelle Bring, RN, Grace Isaac, RN, Elspeth Cho Tech., Technician, Ladona Ridgel, Technician, Luane School, CRNA Referring MD:              Medicines:                Monitored Anesthesia Care Complications:            No immediate complications. Estimated Blood Loss:     Estimated blood loss was minimal. Procedure:                Pre-Anesthesia Assessment:                           - Prior to the procedure, a History and Physical                            was performed, and patient medications and                            allergies were reviewed. The patient's tolerance of                            previous anesthesia was also reviewed. The risks                            and benefits of the procedure and the sedation                            options and risks were discussed with the patient.                            All questions were answered, and informed consent                            was obtained. Prior Anticoagulants: The patient has                            taken Plavix (clopidogrel), last dose was 2 days  prior to procedure. ASA Grade Assessment: III - A                            patient with severe systemic disease. After                            reviewing the risks and benefits, the patient was                            deemed in satisfactory condition to undergo the                            procedure.                           After obtaining informed consent, the colonoscope                          was passed under direct vision. Throughout the                            procedure, the patient's blood pressure, pulse, and                            oxygen saturations were monitored continuously. The                            CF-HQ190L ([id number] Olympus colonoscope was                            introduced through the anus and advanced to the the                            terminal ileum. The colonoscopy was performed                            without difficulty. The patient tolerated the                            procedure well. The quality of the bowel                            preparation was adequate. The terminal ileum,                            ileocecal valve, appendiceal orifice, and rectum                            were photographed. Scope In: 10:59:18 AM Scope Out: 11:22:06 AM Scope Withdrawal Time: 0 hours 19 minutes 14 seconds  Total Procedure Duration: 0 hours 22 minutes 48 seconds  Findings:      The perianal and digital rectal examinations were normal.      A frond-like/villous non-obstructing mass was found in the ascending       colon. The mass was non-circumferential,  measuring approximately 40 mm.       Oozing was present. Given the dual antiplatelet therapy, did not attempt       endoscopic mucosal resection today. The top of the lesion was biopsied       with a cold forceps for histology. Estimated blood loss was minimal.       Area 4-5 cm distal to the lesion was tattooed with an injection of 5 mL       of Spot (carbon black) on the contralateral (mesenteric side) wall.      A few sessile polyps were found in the rectum, sigmoid colon and       ascending colon. The polyps were 2 to 5 mm in size. These were benign       appearing. Given the dual antiplatelet therapy and endoscopic       appearance, these were not resected today.      Non-bleeding internal hemorrhoids were found during retroflexion. The       hemorrhoids were  medium-sized.      The terminal ileum appeared normal. 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                            Biopsied. Tattooed. This is the source of the                            anemia.                           - A few 2 to 5 mm polyps in the rectum, in the                            sigmoid colon and in the ascending colon.                           - Non-bleeding internal hemorrhoids.                           - The examined portion of the ileum was normal. Recommendation:           - Return patient to hospital ward for ongoing care.                           - Resume previous diet today.                           - Await pathology results.                           - Continue to hold Plavix for now.                           - Will discuss options for resection with the                            patient, to include repeat Colonoscopy in 4-5 days  with attempt at endoscopic mucosal resection after                            Plavix washout period vs surgical resection (ie,                            right hemicolectomy).                           - Continue serial H/H checks with transfusion per                            protocol. Procedure Code(s):        --- Professional ---                           [phone], Colonoscopy, flexible; with directed                            submucosal injection(s), any substance                           25366, Colonoscopy, flexible; with biopsy, single                            or multiple Diagnosis Code(s):        --- Professional ---                           D49.0, Neoplasm of unspecified behavior of                            digestive system                           K62.1, Rectal polyp                           K63.5, Polyp of colon                           K64.8, Other hemorrhoids                           D50.0, Iron deficiency anemia secondary to blood                             loss (chronic) CPT copyright 2019 [employer]. All rights reserved. The codes documented in this report are preliminary and upon coder review may  be revised to meet current compliance requirements. Gerrit Heck, MD 05/19/2018 11:39:38 AM Number of Addenda: 0

## 2018-05-19 NOTE — Op Note (Signed)
[facility] [name] Procedure Date : 05/19/2018 MRN: [id number] Attending MD: Gerrit Heck , MD Date of Birth: [birth year] CSN: [id number] Age: 85 Admit Type: Inpatient Procedure:                Upper GI endoscopy Indications:              Iron deficiency anemia Providers:                Gerrit Heck, MD, Raynelle Bring, RN, Grace Isaac, RN, Elspeth Cho Tech., Technician, Ladona Ridgel, Technician, Luane School, CRNA Referring MD:              Medicines:                Monitored Anesthesia Care Complications:            No immediate complications. Estimated Blood Loss:     Estimated blood loss was minimal. Procedure:                Pre-Anesthesia Assessment:                           - Prior to the procedure, a History and Physical                            was performed, and patient medications and                            allergies were reviewed. The patient's tolerance of                            previous anesthesia was also reviewed. The risks                            and benefits of the procedure and the sedation                            options and risks were discussed with the patient.                            All questions were answered, and informed consent                            was obtained. Prior Anticoagulants: The patient has                            taken Plavix (clopidogrel), last dose was 2 days                            prior to procedure. ASA Grade Assessment: III - A  patient with severe systemic disease. After                            reviewing the risks and benefits, the patient was                            deemed in satisfactory condition to undergo the                            procedure.                           After obtaining informed consent, the endoscope was                            passed under direct vision.  Throughout the                            procedure, the patient's blood pressure, pulse, and                            oxygen saturations were monitored continuously. The                            GIF-H190 ([id number] Olympus gastroscope was                            introduced through the mouth, and advanced to the                            second part of duodenum. The upper GI endoscopy was                            accomplished without difficulty. The patient                            tolerated the procedure well. Scope In: Scope Out: Findings:      A 3 cm hiatal hernia was present.      The upper third of the esophagus and middle third of the esophagus were       normal.      A single 4 mm sessile polyp with no bleeding and no stigmata of recent       bleeding was found in the gastric body. Did not attempt snare resection       due to dual antiplatelet therapy. Biopsies were taken with a cold       forceps for histology. Estimated blood loss was minimal.      The gastric fundus, incisura, gastric antrum and pylorus were normal.      The duodenal bulb, first portion of the duodenum and second portion of       the duodenum were normal. Impression:               - 3 cm hiatal hernia.                           -  Normal upper third of esophagus and middle third                            of esophagus.                           - A single gastric polyp. Biopsied.                           - Normal gastric fundus, incisura, antrum and                            pylorus.                           - Normal duodenal bulb, first portion of the                            duodenum and second portion of the duodenum. Recommendation:           - Return patient to hospital ward for ongoing care.                           - Resume previous diet today.                           - Perform a colonoscopy today.                           - Additional recommendations pending colonsocopy                             findings. Procedure Code(s):        --- Professional ---                           [phone], Esophagogastroduodenoscopy, flexible,                            transoral; with biopsy, single or multiple Diagnosis Code(s):        --- Professional ---                           K44.9, Diaphragmatic hernia without obstruction or                            gangrene                           K31.7, Polyp of stomach and duodenum                           D50.9, Iron deficiency anemia, unspecified CPT copyright 2019 [employer]. All rights reserved. The codes documented in this report are preliminary and upon coder review may  be revised to meet current compliance requirements. Gerrit Heck, MD 05/19/2018 11:45:34 AM Number of Addenda: 0

## 2018-05-19 NOTE — Progress Notes (Addendum)
Progress Note  [name] Date of Encounter: 05/19/2018  Primary Cardiologist: Jenean Lindau, MD  Interventionalist/TAVR: Dr. Sherren Mocha  Primary Electrophysiologist: Dr. Lovena Le  Subjective   Post-procedure, tolerated well, no chest pain or SOB Colonoscopy has revealed probable colon cancer in her ascending colon .   Inpatient Medications    Scheduled Meds: . [MAR Hold] atorvastatin  40 mg Oral q1800  . [MAR Hold] levothyroxine  125 mcg Oral QAC breakfast  . [MAR Hold] metoprolol tartrate  50 mg Oral BID  . [MAR Hold] pantoprazole (PROTONIX) IV  40 mg Intravenous Q12H   Continuous Infusions: . lactated ringers 10 mL/hr at 05/19/18 0852   PRN Meds: [MAR Hold] acetaminophen **OR** [MAR Hold] acetaminophen, [MAR Hold] barrier cream   Vital Signs    Vitals:   05/19/18 0832 05/19/18 0848 05/19/18 1129 05/19/18 1140  BP: 132/62 (!) 169/53 (!) 138/41 (!) 140/34  Pulse: 76 64 65 62  Resp:  18 (!) 24 19  Temp: 98.2 F (36.8 C) 98.3 F (36.8 C) 98.6 F (37 C)   TempSrc: Oral Oral Oral   SpO2: 98%  99% 97%  Weight:  85 kg    Height:  5\' 1"  (1.549 m)      Intake/Output Summary (Last 24 hours) at 05/19/2018 1149 Last data filed at 05/19/2018 1103 Gross per 24 hour  Intake 800 ml  Output 1100 ml  Net -300 ml   Last 3 Weights 05/19/2018 05/18/2018 05/17/2018  Weight (lbs) 187 lb 6.3 oz 187 lb 6.3 oz 186 lb 8.2 oz  Weight (kg) 85 kg 85 kg 84.6 kg      Telemetry    SR - Personally Reviewed  ECG    None today - Personally Reviewed  Physical Exam    Physical Exam: Blood pressure (!) 140/34, pulse 62, temperature 98.6 F (37 C), temperature source Oral, resp. rate 19, height 5\' 1"  (1.549 m), weight 85 kg, SpO2 97 %.  General: Well developed, well nourished, female in no acute distress Head: Eyes PERRLA, No xanthomas.   Normocephalic and atraumatic Lungs: few scattered rales bilaterally to auscultation. Heart: HRRR S1 S2, without RG. 2/6 SEM Pulses  are 2+ & equal. No JVD. Abdomen: Bowel sounds are present, abdomen soft and non-tender without masses or  hernias noted. Msk: Normal strength and tone for age. Extremities: No clubbing, cyanosis or edema.    Skin:  No rashes or lesions noted. Neuro: Alert and oriented X 2  Psych:  Good affect, responds appropriately  Labs    Chemistry Recent Labs  Lab 05/17/18 1244 05/18/18 0431 05/19/18 0308  NA 134* 139 140  K 4.4 4.9 3.8  CL 105 104 106  CO2 20* 24 23  GLUCOSE 102* 91 92  BUN 20 15 11   CREATININE 0.88 0.85 0.85  CALCIUM 9.1 8.9 8.8*  PROT 6.2*  --  5.4*  ALBUMIN 3.2*  --  2.9*  AST 18  --  16  ALT 7  --  9  ALKPHOS 66  --  64  BILITOT 0.4  --  1.1  GFRNONAA >60 >60 >60  GFRAA >60 >60 >60  ANIONGAP 9 11 11      Hematology Recent Labs  Lab 05/17/18 1244 05/17/18 2203 05/18/18 0431 05/19/18 0308  WBC 9.2  --  8.7 8.9  RBC 2.27*  --  2.79* 2.85*  HGB 5.8* 7.2* 7.5* 7.5*  HCT 21.1* 23.8* 24.1* 24.8*  MCV 93.0  --  86.4 87.0  MCH 25.6*  --  26.9 26.3  MCHC 27.5*  --  31.1 30.2  RDW 18.6*  --  18.6* 18.8*  PLT 396  --  298 388    Radiology    No results found.  Cardiac Studies   ECHO: 05/18/2018  1. The left ventricle has normal systolic function, with an ejection fraction of 60-65%. The cavity size was normal. There is mildly increased left ventricular wall thickness. Left ventricular diastolic Doppler parameters are consistent with  indeterminate diastolic dysfunction.  2. The right ventricle has normal systolc function. The cavity was normal. There is no increase in right ventricular wall thickness.  3. Left atrial size was moderately dilated.  4. Right atrial size was mildly dilated.  5. The mitral valve is degenerative. Severe thickening of the mitral valve leaflet. Severe calcification of the mitral valve leaflet. Moderate mitral valve stenosis.  6. Tricuspid valve regurgitation is moderate.  7. A 50mm Edwards Sapien bioprosthetic aortic valve  (TAVR) valve is present in the aortic position. Procedure Date: 02/28/2018.  8. The aortic root is normal in size and structure.  9. - TAVR: Post TAVR with 23 mm Sapien 3 gradients higher than ideal for immediate post implant leaflets not well seen No PVL. Mean gradient 19, peak gradient 35.8  Echocardiogram 03/01/2018:  1. The left ventricle has normal systolic function, with an ejection fraction of 60-65%. The cavity size was normal. There is moderately increased left ventricular wall thickness.  2. Left atrial size was severely dilated.  3. Mildly thickened tricuspid valve leaflets.  4. The mitral valve is rheumatic. Severe thickening of the mitral valve leaflet. Severe calcification of the mitral valve leaflet. There is severe mitral annular calcification present. Moderate mitral valve stenosis.  5. The tricuspid valve was normal in structure. Tricuspid valve regurgitation is moderate.  6. Post TAVR with 23 mm Sapien 3 valve Not well visualized no PVL gradients have increased considerably since implant 02/28/18. Mean gradient 22, peak gradient 40 _______________  Right/Left Heart Catheterization 02/21/2018: 1.  Mild to moderate focal proximal LAD stenosis at the origin of the first diagonal branch, does not appear flow obstructive 2.  Severe calcific aortic stenosis with a peak to peak gradient of 60 mmHg, mean gradient 48 mmHg, and calculated valve area 0.96 cm. 3.  Severe mitral annular calcification biplane fluoroscopy with moderate mitral stenosis present, mean gradient 10 mmHg, calculated valve area 1.7 cm 4.  Normal LVEDP, mild pulmonary hypertension, and mildly elevated right heart pressures  Recommendations: Medical therapy for nonobstructive CAD, continued evaluation for consideration of TAVR and treatment of this patient's valvular heart disease.  Anticipate moving forward with CTA studies of the heart and chest/abdomen/pelvis followed by cardiac surgical consultation as part of a  multidisciplinary evaluation.  Patient Profile     [name] is a 85 y.o. female with a history of severe aortic stenosis s/p TAVR in 0/3546 complicated by complete heart block s/p PPM, chronic diastolic heart failure, hypertension, hyperlipidemia, RBBB, CVA with residual R hemiparesis, and carotid stenosis who was admitted for an acute GI bleed with hemoglobin of 5.8. Cardiology consulted for pre-operative evaluation and assistance with antiplatelets at the request of Dr. Higinio Plan .  Assessment & Plan    Severe Aortic Stenosis s/p TAVR 02/2018 - Stable at last appt 03/2018 - plan was for DAPT thru 08/2018 - all anti-platelet agts on hold due to GIB - see endo notes, continue to hold DAPT - echo results above, no sig change in  gradients from 02/2018  Pre-Operative Evaluation - tolerated procedures well  Acute on Chronic CHF - Pt got a dose of IV Lasix 20 mg on 05/06 - wt has been stable since then - I/O net negative 1.8L since admit - continue to follow daily wts - not obviously overloaded, but could consider another dose of IV Lasix, as O2 sats 96 % on 2 L O2  Acute GI Bleed - s/p 2 U PRBCs 05/06 - H&H stable since then - per colonoscopy report, source of bleeding found, likely colon CA - Plavix washout and then decide on management, endoscopic mucosal resection vs hemicolectomy - per IM/GI   For questions or updates, please contact [facility] Please consult [email or web address] for contact info under       Signed, Rosaria Ferries, PA-C  05/19/2018, 11:49 AM     Attending Note:   The patient was seen and examined.  Agree with assessment and plan as noted above.  Changes made to the above note as needed.  Patient seen and independently examined with  Rosaria Ferries. PA .   We discussed all aspects of the encounter. I agree with the assessment and plan as stated above.  1.   S/p TAVR:   Doing well .  No dyspnea.  DAPT is on hold for her GI bleed.  Her valve gradient is  appropriate for her 23 mm TAVR.   2.  Colon cancer:   I've discussed with GI.   I think she could tolerate a partial colectomy.  I would place her at moderate risk for the surgery . If a standard hemicolectomy would offer her a better outcome than the endoscopic resection, I would favor doing a stardard hemicolectomy.  She has normal LV function,  A normally functioning TAVR , no CAD .   I have talked with her son     I have spent a total of 40 minutes with patient reviewing hospital  notes , telemetry, EKGs, labs and examining patient as well as establishing an assessment and plan that was discussed with the patient. > 50% of time was spent in direct patient care.    Thayer Headings, Brooke Bonito., MD, [facility] 05/19/2018, 2:32 PM [address],  [place] Pager [phone]

## 2018-05-19 NOTE — Progress Notes (Signed)
Paged family residency for PT eval

## 2018-05-19 NOTE — Progress Notes (Signed)
Spoke with patients son and he wants a call from physician for update.  Paged family residence service.

## 2018-05-19 NOTE — Anesthesia Procedure Notes (Signed)
Procedure Name: MAC Date/Time: 05/19/2018 10:45 AM Performed by: Renato Shin, CRNA Pre-anesthesia Checklist: Patient identified, Emergency Drugs available, Suction available and Patient being monitored Patient Re-evaluated:Patient Re-evaluated prior to induction Oxygen Delivery Method: Nasal cannula Preoxygenation: Pre-oxygenation with 100% oxygen Induction Type: IV induction Placement Confirmation: positive ETCO2 and breath sounds checked- equal and bilateral Dental Injury: Teeth and Oropharynx as per pre-operative assessment

## 2018-05-19 NOTE — Anesthesia Preprocedure Evaluation (Addendum)
Anesthesia Evaluation  Patient identified by MRN, date of birth, ID band Patient awake    Reviewed: Allergy & Precautions, H&P , NPO status , Patient's Chart, lab work & pertinent test results, reviewed documented beta blocker date and time   Airway Mallampati: II  TM Distance: >3 FB Neck ROM: Full    Dental no notable dental hx. (+) Partial Upper, Dental Advisory Given,    Pulmonary neg pulmonary ROS, former smoker,    Pulmonary exam normal breath sounds clear to auscultation       Cardiovascular Exercise Tolerance: Good hypertension, Pt. on medications and Pt. on home beta blockers + Peripheral Vascular Disease and +CHF  negative cardio ROS  + Valvular Problems/Murmurs AS  Rhythm:Regular Rate:Normal + Systolic murmurs    Neuro/Psych negative neurological ROS  negative psych ROS   GI/Hepatic negative GI ROS, Neg liver ROS,   Endo/Other  negative endocrine ROSHypothyroidism   Renal/GU negative Renal ROS  negative genitourinary   Musculoskeletal  (+) Arthritis ,   Abdominal   Peds  Hematology  (+) Blood dyscrasia, anemia ,   Anesthesia Other Findings   Reproductive/Obstetrics negative OB ROS                            Anesthesia Physical  Anesthesia Plan  ASA: III  Anesthesia Plan: General   Post-op Pain Management:    Induction: Intravenous  PONV Risk Score and Plan: 3 and Treatment may vary due to age or medical condition  Airway Management Planned: Oral ETT  Additional Equipment:   Intra-op Plan:   Post-operative Plan: Extubation in OR  Informed Consent: I have reviewed the patients History and Physical, chart, labs and discussed the procedure including the risks, benefits and alternatives for the proposed anesthesia with the patient or authorized representative who has indicated his/her understanding and acceptance.     Dental advisory given  Plan Discussed with:  CRNA, Anesthesiologist and Surgeon  Anesthesia Plan Comments: (  )        Anesthesia Quick Evaluation

## 2018-05-20 ENCOUNTER — Inpatient Hospital Stay (HOSPITAL_COMMUNITY): Payer: Medicare Other

## 2018-05-20 ENCOUNTER — Encounter (HOSPITAL_COMMUNITY): Payer: Self-pay | Admitting: Radiology

## 2018-05-20 DIAGNOSIS — K6389 Other specified diseases of intestine: Secondary | ICD-10-CM

## 2018-05-20 DIAGNOSIS — K449 Diaphragmatic hernia without obstruction or gangrene: Secondary | ICD-10-CM

## 2018-05-20 DIAGNOSIS — I5033 Acute on chronic diastolic (congestive) heart failure: Secondary | ICD-10-CM

## 2018-05-20 LAB — CBC WITH DIFFERENTIAL/PLATELET
Abs Immature Granulocytes: 0.08 10*3/uL — ABNORMAL HIGH (ref 0.00–0.07)
Basophils Absolute: 0 10*3/uL (ref 0.0–0.1)
Basophils Relative: 0 %
Eosinophils Absolute: 0.3 10*3/uL (ref 0.0–0.5)
Eosinophils Relative: 3 %
HCT: 24.5 % — ABNORMAL LOW (ref 36.0–46.0)
Hemoglobin: 7.2 g/dL — ABNORMAL LOW (ref 12.0–15.0)
Immature Granulocytes: 1 %
Lymphocytes Relative: 11 %
Lymphs Abs: 1.5 10*3/uL (ref 0.7–4.0)
MCH: 25.6 pg — ABNORMAL LOW (ref 26.0–34.0)
MCHC: 29.4 g/dL — ABNORMAL LOW (ref 30.0–36.0)
MCV: 87.2 fL (ref 80.0–100.0)
Monocytes Absolute: 1.1 10*3/uL — ABNORMAL HIGH (ref 0.1–1.0)
Monocytes Relative: 8 %
Neutro Abs: 10.2 10*3/uL — ABNORMAL HIGH (ref 1.7–7.7)
Neutrophils Relative %: 77 %
Platelets: 374 10*3/uL (ref 150–400)
RBC: 2.81 MIL/uL — ABNORMAL LOW (ref 3.87–5.11)
RDW: 18.9 % — ABNORMAL HIGH (ref 11.5–15.5)
WBC: 13.1 10*3/uL — ABNORMAL HIGH (ref 4.0–10.5)
nRBC: 0 % (ref 0.0–0.2)

## 2018-05-20 LAB — BASIC METABOLIC PANEL
Anion gap: 10 (ref 5–15)
BUN: 9 mg/dL (ref 8–23)
CO2: 24 mmol/L (ref 22–32)
Calcium: 8.7 mg/dL — ABNORMAL LOW (ref 8.9–10.3)
Chloride: 102 mmol/L (ref 98–111)
Creatinine, Ser: 0.85 mg/dL (ref 0.44–1.00)
GFR calc Af Amer: >60 mL/min (ref >60)
GFR calc non Af Amer: >60 mL/min (ref >60)
Glucose, Bld: 100 mg/dL — ABNORMAL HIGH (ref 70–99)
Potassium: 3.8 mmol/L (ref 3.5–5.1)
Sodium: 136 mmol/L (ref 135–145)

## 2018-05-20 MED ORDER — IOHEXOL 300 MG/ML  SOLN
100.0000 mL | Freq: Once | INTRAMUSCULAR | Status: AC | PRN
  Administered 2018-05-20: 100 mL via INTRAVENOUS

## 2018-05-20 NOTE — Progress Notes (Signed)
Paged GI and general surgery doctors as pts son is not happy because doctors are not calling him and letting him know the plan of care as the patient cannot advocate for herself.

## 2018-05-20 NOTE — Progress Notes (Signed)
PT Cancellation Note  Patient Details Name: Julie Mcgee MRN: [id number] DOB: 07-09-1933   Cancelled Treatment:    Reason Eval/Treat Not Completed: Patient at procedure or test/unavailable. PT will continue to follow up with pt acutely as available.    [place] 05/20/2018, 5:12 PM

## 2018-05-20 NOTE — Progress Notes (Signed)
Await final path to determine endoscopic vs colectomy   Will check back after path done

## 2018-05-20 NOTE — Progress Notes (Signed)
     [facility] Progress Note  CC:  GI Bleed  Subjective: No abdominal pain. No BM since EGD/colonoscopy 5/8. Tolerating a full liquid diet. Wants to get out of bed. Reports being in bed for 6 days. Objective:  Vital signs in last 24 hours: Temp:  [98 F (36.7 C)-99.3 F (37.4 C)] 98.3 F (36.8 C) (05/09 0937) Pulse Rate:  [66-81] 68 (05/09 0937) Resp:  [16-20] 16 (05/09 0937) BP: (107-130)/(44-69) 117/44 (05/09 0937) SpO2:  [95 %-100 %] 96 % (05/09 0937) Weight:  [83.7 kg] 83.7 kg (05/08 2126) Last BM Date: 05/19/18 General:   Alert,  Well-developed,    in NAD Heart:  Regular rate and rhythm; no murmurs Pulm: Lungs clear, decreased bases. Abdomen:  Soft, nontender and nondistended. Normal bowel sounds, without guarding, and without rebound.   Extremities:  Without edema. Neurologic:  Alert and  oriented x4;  grossly normal neurologically. Psych:  Alert and cooperative. Normal mood and affect.  Intake/Output from previous day: 05/08 0701 - 05/09 0700 In: 200 [I.V.:200] Out: 600 [Urine:600] Intake/Output this shift: Total I/O In: -  Out: 100 [Urine:100]  Lab Results: Recent Labs    05/19/18 0308 05/19/18 2253 05/20/18 0410  WBC 8.9 17.9* 13.1*  HGB 7.5* 7.5* 7.2*  HCT 24.8* 24.5* 24.5*  PLT 388 379 374   BMET Recent Labs    05/18/18 0431 05/19/18 0308 05/20/18 0410  NA 139 140 136  K 4.9 3.8 3.8  CL 104 106 102  CO2 24 23 24   GLUCOSE 91 92 100*  BUN 15 11 9   CREATININE 0.85 0.85 0.85  CALCIUM 8.9 8.8* 8.7*   LFT Recent Labs    05/19/18 0308  PROT 5.4*  ALBUMIN 2.9*  AST 16  ALT 9  ALKPHOS 64  BILITOT 1.1   PT/INR No results for input(s): LABPROT, INR in the last 72 hours. Hepatitis Panel No results for input(s): HEPBSAG, HCVAB, HEPAIGM, HEPBIGM in the last 72 hours.  No results found.  Assessment / Plan:  1. 85 y.o. female with GI bleed with Anemia. EGD 5/8 showed a 3cm HH and a gastric polyp. Colonoscopy 5/8 identified  a 41mm mass (likely malignant) to the ascending colon, mucosal resection deferred as pt was on dual antiplatelet therapy. A few polys in the rectum and colon. Plan for repeat colonoscopy next week after Plavix washout. Hg 7.2 << 7.5. -surgical consult for possible right hemicolectomy  -repeat colonoscopy if patient does not have surgery -monitor H/H -Transfuse for Hg < 7.  -Hold Plavix. -chest/abd/pelvic CT with oral and IV contrast -full liquid diet, advance to soft diet as tolerated  -OOB with assistance -physical therapy consult  2. Leukocytosis improving. WBC 13.1 << 17.9. She is afebrile.   3. HX severe AS, S/P status post total aortic valve replacement February 1700 complicated by cardiac arrest with complete heart block necessitating pacemaker placement     LOS: 3 days   Noralyn Pick  05/20/2018, 1:27 PM

## 2018-05-20 NOTE — Progress Notes (Signed)
Family Medicine Teaching Service Daily Progress Note Intern Pager: [phone]  [name] Medical record number: [id number] Date of birth: 10-12-1933 Age: 85 y.o. Gender: female Primary Care Provider: Lyman Bishop, DO Consultants: GI, Cards  Code Status: Full  Assessment and Plan: Julie Mcgee is a 85 y.o. female presenting symptomatic anemia to 5.8, found to have a colon mass concerning for malignancy. PMH is significant for recent TAVR in 02/2018 for severe aortic stenosis, complete heart block s/p pacemaker placement in 02/2018, HFpEF, moderate mitral valve stenosis, hypothyroidism, hypertension, hyperlipidemia.  Acute anemia, in setting of newly found acsending colon mass: Stable. Underwent EGD/colonoscopy yesterday, found a frond-like/villous, nonobstructing acsending colon mass with oozing suspicious for malignancy.  EGD generally unremarkable.  Hgb remaining stable, 7.2 this morning.  Leukocytosis likely related to interventions on 5/8.  Updated son today on current plan, will reach out to GI to also have them also speak with him. -GI on board, appreciate recommendations, will discuss endoscopic resection vs involving surgery for hemicolectomy -Continue holding home aspirin and Plavix, SCDs in place --Monitor CBC  - Follow-up pathology   Aortic severe stenosis s/p TAVR February 5427, complicated by cardiac arrest: Stable. Home aspirin and Plavix on hold currently for above.  TAVR functioning appropriately.  -Follow-up cardiology recommendations -Holding home aspirin and Plavix in setting of above  Chronic HFpEF with EF 60-65%:  Stable. Appears euvolemic.  -Cardiology on board, appreciate recommendations -Continue home Lasix 20 mg daily -Blood pressure control as below  Complete heart block, s/p pacemaker placement: Chronic, stable. Pacemaker placed in 02/2018. HR in 60s-70s - Monitor telemetry - Continue home metoprolol  Previous CVA: Chronic,  stable. - Hold home aspirin for above - Continue home statin  Hypothyroidism: Chronic, stable. TSH 3.3 in 02/2018.  - Continue home Synthroid 125 mcg  Hypertension: Chronic, stable. SBP 100-120s overnight.  Dose of metoprolol held overnight. -Monitor BP - Continue home Lopressor 50 mg twice daily  Hyperlipidemia: Chronic, stable. Takes simvastatin 80 mg daily. - Continue statin, substitute with atorvastatin formulary  Perirectal skin breakdown: Acute, improving. - Barrier cream as needed - Continue to monitor  FEN/GI:  Full liquid diet per GI  Prophylaxis: SCDs  Disposition:  Awaiting pathology, considerations for further interventions  Subjective:  Doing well this morning, no acute events overnight.  Says she is feeling wonderful but has not been moving much and would like to walk around.  Aware of the mass in her colon.  Is considering her future options, would like to wait with pathology and speak with her son.  Objective: Temp:  [98 F (36.7 C)-99.3 F (37.4 C)] 98.3 F (36.8 C) (05/09 0443) Pulse Rate:  [61-81] 66 (05/09 0443) Resp:  [18-24] 18 (05/09 0443) BP: (107-169)/(34-69) 126/44 (05/09 0443) SpO2:  [95 %-100 %] 97 % (05/09 0443) Weight:  [83.7 kg-85 kg] 83.7 kg (05/08 2126) Physical Exam: General: Alert, NAD pleasant older female HEENT: NCAT, MMM Cardiac: RRR with 2/6 systolic murmur Lungs: Clear bilaterally, no increased WOB on RA Abdomen: soft, non-tender, non-distended, normoactive BS Msk: Moves all extremities spontaneously  Ext: Warm, dry, 2+ distal pulses, no edema     Laboratory: Recent Labs  Lab 05/19/18 0308 05/19/18 2253 05/20/18 0410  WBC 8.9 17.9* 13.1*  HGB 7.5* 7.5* 7.2*  HCT 24.8* 24.5* 24.5*  PLT 388 379 374   Recent Labs  Lab 05/17/18 1244 05/18/18 0431 05/19/18 0308 05/20/18 0410  NA 134* 139 140 136  K 4.4 4.9 3.8 3.8  CL  105 104 106 102  CO2 20* 24 23 24   BUN 20 15 11 9   CREATININE 0.88 0.85 0.85 0.85   CALCIUM 9.1 8.9 8.8* 8.7*  PROT 6.2*  --  5.4*  --   BILITOT 0.4  --  1.1  --   ALKPHOS 66  --  64  --   ALT 7  --  9  --   AST 18  --  16  --   GLUCOSE 102* 91 92 100*    Imaging/Diagnostic Tests: No results found.  Patriciaann Clan, DO 05/20/2018, 7:56 AM PGY-1, [facility] Intern pager: [phone], text pages welcome

## 2018-05-20 NOTE — Progress Notes (Signed)
Metoprolol not given due to her BP 117/44, notified the MD.  No further orders given.

## 2018-05-20 NOTE — Progress Notes (Signed)
Patient ID: Julie Mcgee, female   DOB: 1933-12-10, 85 y.o.   MRN: [id number]   I returned the patient's son, Sherlyn Hay, call. He is quite frustrated that he cannot see his mother during her hospitalization but he understands this is the protocol due to Covid19. He questions if there is any way he can see his mother. I advised I will communicate with Dr. Bryan Lemma tomorrow am with his concerns. He also requests that all doctors call him when seeing his mother. He would like a conference call as he stated his mother does not understand what is going on. I will call him when I see his mother tomorrow am. Telephone conversation was 15+ minutes.

## 2018-05-20 NOTE — Progress Notes (Signed)
Patient up in recliner with 2 assist.

## 2018-05-20 NOTE — Discharge Summary (Deleted)
[facility] Discharge Summary  [name] Medical record number: [id number] Date of birth: 30-Jan-1933 Age: 85 y.o. Gender: female Date of Admission: 05/17/2018  Date of Discharge: 05/27/18  Admitting Physician: Baird Kay, MD  Primary Care Provider: Lyman Bishop, DO Consultants: GI, cardiology, general surgery  Indication for Hospitalization: Symptomatic anemia  Discharge Diagnoses/Problem List:  Anemia, in setting of a sending colonic adenocarcinoma, ss/p hemicolectomy Aortic severe stenosis s/p TAVR in 02/2018 Chronic HFpEF with EF 60-65% Complete heart block, s/p pacemaker placement Previous CVA Hypothyroidism Hypertension Hyperlipidemia  Disposition: Home with home health  Discharge Condition: Stable  Discharge Exam:  General: NAD, sitting up in chair Cardiovascular: RRR, no M/R/G Respiratory: CTA B, normal work of breathing Abdomen: Incision without erythema or drainage Extremities: No lower extremity edema noted  Brief Hospital Course:  Ms. Korzeniewski is an 85 year old female, with a history of recent TAVR in 02/2018 for severe aortic stenosis on aspirin and Plavix, who presented with lightheadedness and fatigue, found to be anemic to 5.8 on arrival.  Aspirin and Plavix held.  Received 2 U pRBCs with improvement to hgb 7.5.  GI was consulted to evaluate for GIB, performed an EGD and colonoscopy on 5/8, which revealed a frond-like/villous, nonobstructing ascending colon mass with oozing suspicious for malignancy.  Pathology revealed invasive adenocarcinoma and the patient was scheduled for a right hemicolectomy.  Hemicolectomy was performed on 05/24/2018.  Pathology report can be seen below.  After surgery, her diet was advanced.  PT recommended home with home health and 24-hour supervision which family will provide for at least the first week.  Prior to discharge patient's hemoglobin remained stable at 7.1 while on her  home DAPT.  CT scan revealed a mass on the patient's breast suspicious for cancer. Oncology was curbsided while admitted and does not believe it was a metastasis of the adenocarcinoma. IR was also consulted and agrees it is in patient's best interest to have breast mass worked up outpatient. She will need follow up for breast mass and oncology.   Issues for Follow Up:  1. Malignancy - the patient will need further workup of her adenocarcinoma.  Ensure patient goes to f/u appt w/ general surgery, and has follow-up with cancer center. 2. Anemia - would need cbc at [place] and further workup of anemia if necessary.  Patient given IV iron prior to discharge and was told to take home iron every other day. 3. Patient require work-up of possible breast cancer given mass seen on right breast.  She will need mammogram with biopsy and possible referral. 4. Patient with some perirectal skin breakdown during admission which caused some discomfort.  Ensure that this has improved with use of barrier cream.  Significant Procedures:  Surgical pathology from hemicolectomy 05/24/2018: 1. Colon, segmental resection for tumor, right Ascending colon and terminal ileum - INVASIVE ADENOCARCINOMA, MODERATELY DIFFERENTIATED, SPANNING 2.5 CM. - CARCINOMA ARISES FROM A TUBULAR ADENOMA. - TUMOR INVADES INTO SUBMUCOSA. - RESECTION MARGINS ARE NEGATIVE. - TUBULAR ADENOMA (X1). - EIGHTEEN OF EIGHTEEN LYMPH NODES NEGATIVE FOR CARCINOMA (0/18). - SEE ONCOLOGY TABLE. 2. Small intestine, resection, ileum additional margin - BENIGN SMALL BOWEL MUCOSA. - NO DYSPLASIA OR MALIGNANCY.  Biopsy pathology from biopsy on 05/19/2018: 1. Duodenum, Biopsy - BENIGN SMALL BOWEL MUCOSA. - NO VILLOUS BLUNTING OR INCREASE IN INTRAEPITHELIAL LYMPHOCYTES. - NO DYSPLASIA OR MALIGNANCY. 2. Stomach, polyp(s) - FUNDIC GLAND POLYP. - WARTHIN-STARRY IS NEGATIVE FOR HELICOBACTER PYLORI. - NO INTESTINAL METAPLASIA, DYSPLASIA, OR  MALIGNANCY. 3.  Colon, biopsy, ascending - INVASIVE ADENOCARCINOMA.  Colonoscopy Impression:  - Likely malignant tumor in the ascending colon. Biopsied. Tattooed. This is the source of the anemia. - A few 2 to 5 mm polyps in the rectum, in the sigmoid colon and in the ascending colon. - Non-bleeding internal hemorrhoids. - The examined portion of the ileum was normal.  EGD Impression:   - 3 cm hiatal hernia. - Normal upper third of esophagus and middle third of esophagus. - A single gastric polyp. Biopsied. - Normal gastric fundus, incisura, antrum and pylorus. - Normal duodenal bulb, first portion of the duodenum and second portion of the duodenum.  Significant Labs and Imaging:  Recent Labs  Lab 05/25/18 0440 05/26/18 0357 05/27/18 0528  WBC 14.6* 11.4* 9.2  HGB 7.9* 7.3* 7.1*  HCT 27.1* 25.3* 24.1*  PLT 339 306 276   Recent Labs  Lab 05/22/18 0654 05/23/18 0319 05/25/18 0440 05/26/18 0357  NA 138 138 137 136  K 4.7 4.3 4.4 4.3  CL 106 102 105 103  CO2 25 24 23 26   GLUCOSE 95 91 117* 100*  BUN 7* 6* 10 10  CREATININE 0.86 0.81 0.87 0.86  CALCIUM 8.8* 8.7* 9.0 8.7*    Results/Tests Pending at Time of Discharge: none  Discharge Medications:  Allergies as of 05/27/2018      Reactions   Adhesive [tape] Other (See Comments)   Causes BLISTERS      Medication List    TAKE these medications   amoxicillin 500 MG tablet Commonly known as:  AMOXIL Please take 2,000 mg (4 tablets) one hour prior to all dental procedures.   aspirin EC 81 MG tablet Take 1 tablet (81 mg total) by mouth daily.   CALTRATE 600+D3 PO Take 1 tablet by mouth 2 (two) times daily.   clopidogrel 75 MG tablet Commonly known as:  PLAVIX Take 1 tablet (75 mg total) by mouth daily with breakfast.   diphenhydrAMINE 25 MG tablet Commonly known as:  BENADRYL Take 25 mg by mouth every 6 (six) hours as needed.   ferrous sulfate 325 (65 FE) MG tablet Take 1 tablet (325 mg total) by mouth every other  day. What changed:  when to take this   furosemide 20 MG tablet Commonly known as:  Lasix Take 1 tablet (20 mg total) by mouth daily. What changed:    when to take this  reasons to take this   levothyroxine 100 MCG tablet Commonly known as:  SYNTHROID Take 100 mcg by mouth daily before breakfast.   Melatonin 5 MG Caps Take 5 mg by mouth at bedtime as needed (sleep).   metoprolol tartrate 50 MG tablet Commonly known as:  LOPRESSOR Take 50 mg by mouth 2 (two) times daily.   Multivitamin Adults 50+ Tabs Take 1 tablet by mouth daily.   oxyCODONE 5 MG immediate release tablet Commonly known as:  Oxy IR/ROXICODONE Take 1 tablet (5 mg total) by mouth every 4 (four) hours as needed for moderate pain or severe pain.   pantoprazole 40 MG tablet Commonly known as:  PROTONIX Take 1 tablet (40 mg total) by mouth daily. Start taking on:  May 28, 2018   saccharomyces boulardii 250 MG capsule Commonly known as:  FLORASTOR Take 1 capsule (250 mg total) by mouth 2 (two) times daily.   simvastatin 80 MG tablet Commonly known as:  ZOCOR Take 80 mg by mouth every evening.  Durable Medical Equipment  (From admission, onward)         Start     Ordered   05/27/18 1412  For home use only DME 3 n 1  Once     05/27/18 1413   05/26/18 1553  For home use only DME Bedside commode  Once    Question:  Patient needs a bedside commode to treat with the following condition  Answer:  Frailty   05/26/18 1553          Discharge Instructions: Please refer to Patient Instructions section of EMR for full details.  Patient was counseled important signs and symptoms that should prompt return to medical care, changes in medications, dietary instructions, activity restrictions, and follow up appointments.   Follow-Up Appointments: Follow-up Information    Ileana Roup, MD. Go on 06/13/2018.   Specialty:  General Surgery Why:  Your appointment is 6/2 at 10am. Please arrive 30  minutes prior to your appointment to check in and fill out paperwork. Bring insurance information and photo ID. Contact information: [place] [address] [phone]           Aries Kasa, [place], DO 05/27/2018, 2:21 PM PGY-2, [place]

## 2018-05-20 NOTE — Consult Note (Signed)
Reason for Consult: Ascending colon mass Referring Physician: Arloa Koh MD  Julie Mcgee is an 85 y.o. female.  HPI: Asked see patient at request of GI service for anemia.  Patient admitted underwent colonoscopy yesterday which showed a 4 cm mass ascending colon.  History of dual antiplatelet therapy for aortic valve replacement.  History of anemia and falls.  Biopsy pending.  Patient denies any abdominal pain today or blood in her stool.  Multiple chronic medical problems including congestive heart failure hypertension peripheral vascular disease and history of aortic valve repair  She has a pacemaker in place and had suffered a cardiac arrest during her aortic valve replacement.  She has since recovered.  Past Medical History:  Diagnosis Date  . Arthritis   . Carotid artery stenosis   . Chronic diastolic CHF (congestive heart failure) ([place])   . Former smoker    1/2 ppd for 45 years  . 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 Hyperlipidemia   . Hypertension   . Hypothyroidism   . Peripheral vascular disease ([place])   . RBBB   . 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 Severe aortic stenosis     Past Surgical History:  Procedure Laterality Date  . BASAL CELL CARCINOMA EXCISION  2018  . CARDIAC CATHETERIZATION    . COLONOSCOPY  2001  . HIP SURGERY  2005  . KNEE SURGERY  1998  . PACEMAKER IMPLANT N/A 03/02/2018   Procedure: PACEMAKER IMPLANT;  Surgeon: [place] Lance, MD;  Location: [facility];  Service: Cardiovascular;  Laterality: N/A;  . RIGHT/LEFT HEART CATH AND CORONARY ANGIOGRAPHY N/A 02/21/2018   Procedure: RIGHT/LEFT HEART CATH AND CORONARY ANGIOGRAPHY;  Surgeon: Sherren Mocha, MD;  Location: [facility];  Service: Cardiovascular;  Laterality: N/A;  . TEE WITHOUT CARDIOVERSION N/A 02/28/2018   Procedure: TRANSESOPHAGEAL ECHOCARDIOGRAM (TEE);  Surgeon: Sherren Mocha, MD;  Location: [facility];  Service: Open Heart Surgery;  Laterality: N/A;  . TEMPORARY PACEMAKER N/A 03/01/2018   Procedure: TEMPORARY PACEMAKER;  Surgeon: Burnell Blanks, MD;  Location: [facility];  Service: Cardiovascular;  Laterality: N/A;  . TONSILLECTOMY    . TRANSCATHETER AORTIC VALVE REPLACEMENT, TRANSFEMORAL N/A 02/28/2018   Procedure: TRANSCATHETER AORTIC VALVE REPLACEMENT, TRANSFEMORAL;  Surgeon: Sherren Mocha, MD;  Location: [facility];  Service: Open Heart Surgery;  Laterality: N/A;    Family History  Problem Relation Age of Onset  . Heart failure Mother   . Chronic Renal Failure Mother   . Alcohol abuse Father     Social History:  reports that she quit smoking about 8 years ago. Her smoking use included cigarettes. She has a 45.00 pack-year smoking history. She has never used smokeless tobacco. She reports previous alcohol use. She reports that she does not use drugs.  Allergies:  Allergies  Allergen Reactions  . Adhesive [Tape] Other (See Comments)    Causes BLISTERS    Medications: I have reviewed the patient's current medications.  Results for orders placed or performed during the hospital encounter of 05/17/18 (from the past 48 hour(s))  CBC     Status: Abnormal   Collection Time: 05/19/18  3:08 AM  Result Value Ref Range   WBC 8.9 4.0 - 10.5 K/uL   RBC 2.85 (L) 3.87 - 5.11 MIL/uL   Hemoglobin 7.5 (L)  12.0 - 15.0 g/dL   HCT 24.8 (L) 36.0 - 46.0 %   MCV 87.0 80.0 - 100.0 fL   MCH 26.3 26.0 - 34.0 pg   MCHC 30.2 30.0 - 36.0 g/dL   RDW 18.8 (H) 11.5 - 15.5 %   Platelets 388 150 - 400 K/uL   nRBC 0.0 0.0 - 0.2 %    Comment: Performed at [facility] [address] [place], [place] [address]  Comprehensive metabolic panel     Status: Abnormal   Collection Time: 05/19/18  3:08 AM  Result Value Ref Range   Sodium 140 135 - 145 mmol/L   Potassium  3.8 3.5 - 5.1 mmol/L   Chloride 106 98 - 111 mmol/L   CO2 23 22 - 32 mmol/L   Glucose, Bld 92 70 - 99 mg/dL   BUN 11 8 - 23 mg/dL   Creatinine, Ser 0.85 0.44 - 1.00 mg/dL   Calcium 8.8 (L) 8.9 - 10.3 mg/dL   Total Protein 5.4 (L) 6.5 - 8.1 g/dL   Albumin 2.9 (L) 3.5 - 5.0 g/dL   AST 16 15 - 41 U/L   ALT 9 0 - 44 U/L   Alkaline Phosphatase 64 38 - 126 U/L   Total Bilirubin 1.1 0.3 - 1.2 mg/dL   GFR calc non Af Amer >60 >60 mL/min   GFR calc Af Amer >60 >60 mL/min   Anion gap 11 5 - 15    Comment: Performed at [facility] [address], [place], [place] [address]  CBC     Status: Abnormal   Collection Time: 05/19/18 10:53 PM  Result Value Ref Range   WBC 17.9 (H) 4.0 - 10.5 K/uL   RBC 2.81 (L) 3.87 - 5.11 MIL/uL   Hemoglobin 7.5 (L) 12.0 - 15.0 g/dL   HCT 24.5 (L) 36.0 - 46.0 %   MCV 87.2 80.0 - 100.0 fL   MCH 26.7 26.0 - 34.0 pg   MCHC 30.6 30.0 - 36.0 g/dL   RDW 18.7 (H) 11.5 - 15.5 %   Platelets 379 150 - 400 K/uL   nRBC 0.0 0.0 - 0.2 %    Comment: Performed at [facility] [address], [place], [place] [address]  CBC with Differential/Platelet     Status: Abnormal   Collection Time: 05/20/18  4:10 AM  Result Value Ref Range   WBC 13.1 (H) 4.0 - 10.5 K/uL   RBC 2.81 (L) 3.87 - 5.11 MIL/uL   Hemoglobin 7.2 (L) 12.0 - 15.0 g/dL   HCT 24.5 (L) 36.0 - 46.0 %   MCV 87.2 80.0 - 100.0 fL   MCH 25.6 (L) 26.0 - 34.0 pg   MCHC 29.4 (L) 30.0 - 36.0 g/dL   RDW 18.9 (H) 11.5 - 15.5 %   Platelets 374 150 - 400 K/uL   nRBC 0.0 0.0 - 0.2 %   Neutrophils Relative % 77 %   Neutro Abs 10.2 (H) 1.7 - 7.7 K/uL   Lymphocytes Relative 11 %   Lymphs Abs 1.5 0.7 - 4.0 K/uL   Monocytes Relative 8 %   Monocytes Absolute 1.1 (H) 0.1 - 1.0 K/uL   Eosinophils Relative 3 %   Eosinophils Absolute 0.3 0.0 - 0.5 K/uL   Basophils Relative 0 %   Basophils Absolute 0.0 0.0 - 0.1 K/uL   Immature Granulocytes 1 %   Abs Immature Granulocytes 0.08 (H) 0.00 - 0.07 K/uL    Comment:  Performed  at [facility], [place] [address], [place], [place] [address]  Basic metabolic panel     Status: Abnormal   Collection Time: 05/20/18  4:10 AM  Result Value Ref Range   Sodium 136 135 - 145 mmol/L   Potassium 3.8 3.5 - 5.1 mmol/L   Chloride 102 98 - 111 mmol/L   CO2 24 22 - 32 mmol/L   Glucose, Bld 100 (H) 70 - 99 mg/dL   BUN 9 8 - 23 mg/dL   Creatinine, Ser 0.85 0.44 - 1.00 mg/dL   Calcium 8.7 (L) 8.9 - 10.3 mg/dL   GFR calc non Af Amer >60 >60 mL/min   GFR calc Af Amer >60 >60 mL/min   Anion gap 10 5 - 15    Comment: Performed at [facility], [place] [address] [place], [place] [address]    No results found.  Review of Systems  Gastrointestinal: Negative for abdominal pain, blood in stool, nausea and vomiting.  All other systems reviewed and are negative.  Blood pressure (!) 117/44, pulse 68, temperature 98.3 F (36.8 C), temperature source Oral, resp. rate 16, height 5\' 1"  (1.549 m), weight 83.7 kg, SpO2 96 %. Physical Exam  Constitutional: She is oriented to person, place, and time. She appears well-developed and well-nourished.  HENT:  Head: Normocephalic and atraumatic.  Eyes: Pupils are equal, round, and reactive to light. EOM are normal.  Cardiovascular: Normal rate.  Respiratory: Effort normal.  GI: Soft. Bowel sounds are normal. She exhibits no distension. There is no abdominal tenderness. There is no rebound.  Musculoskeletal: Normal range of motion.  Neurological: She is alert and oriented to person, place, and time.  Skin: Skin is warm and dry.    Assessment/Plan: Ascending colon mass  Symptomatic anemia  Await biopsy result.  Recommend cardiology consultation for assessment of operative risk for partial colectomy  If high risk, may benefit from endoscopic resection if benign.  For follow-up Monday once pathology returns to discuss further with patient  [place] 05/20/2018, 2:00 PM

## 2018-05-21 DIAGNOSIS — Z952 Presence of prosthetic heart valve: Secondary | ICD-10-CM

## 2018-05-21 LAB — CBC WITH DIFFERENTIAL/PLATELET
Abs Immature Granulocytes: 0.03 10*3/uL (ref 0.00–0.07)
Basophils Absolute: 0 10*3/uL (ref 0.0–0.1)
Basophils Relative: 1 %
Eosinophils Absolute: 0.5 10*3/uL (ref 0.0–0.5)
Eosinophils Relative: 6 %
HCT: 26.2 % — ABNORMAL LOW (ref 36.0–46.0)
Hemoglobin: 7.7 g/dL — ABNORMAL LOW (ref 12.0–15.0)
Immature Granulocytes: 0 %
Lymphocytes Relative: 12 %
Lymphs Abs: 1 10*3/uL (ref 0.7–4.0)
MCH: 25.6 pg — ABNORMAL LOW (ref 26.0–34.0)
MCHC: 29.4 g/dL — ABNORMAL LOW (ref 30.0–36.0)
MCV: 87 fL (ref 80.0–100.0)
Monocytes Absolute: 0.7 10*3/uL (ref 0.1–1.0)
Monocytes Relative: 8 %
Neutro Abs: 5.9 10*3/uL (ref 1.7–7.7)
Neutrophils Relative %: 73 %
Platelets: 389 10*3/uL (ref 150–400)
RBC: 3.01 MIL/uL — ABNORMAL LOW (ref 3.87–5.11)
RDW: 18.6 % — ABNORMAL HIGH (ref 11.5–15.5)
WBC: 8.1 10*3/uL (ref 4.0–10.5)
nRBC: 0 % (ref 0.0–0.2)

## 2018-05-21 NOTE — Progress Notes (Addendum)
[facility]:  Asending colon mass, pathology pending Symptomatic anemia. Stable Hgb. 7.7. S/p TAVR Holding antiplatelet therapy due to GI bleed and anticipating possible surgery Cardiology evaluation and risk assessment completed.  Basically cleared for surgery if this is necessary She is stable.  We will follow-up on Monday once pathology returns to discussed with patient and son   Julie Mcgee. Dalbert Batman, M.D., [facility] General and Minimally invasive Surgery Breast and Colorectal Surgery Office:   [phone]

## 2018-05-21 NOTE — Evaluation (Signed)
Physical Therapy Evaluation Patient Details Name: Julie Mcgee MRN: [id number] DOB: 09-24-1933 Today's Date: 05/21/2018   History of Present Illness  Julie Mcgee is a 85 y.o. female presenting with a 1-2-week history of darker stools and lightheadedness, found to have a hemoglobin of 5.8 on arrival, concerning for an acute GI bleed. Mass found, concerning for malignancy; they are considering surgery; PMH is significant for recent TAVR in 02/2018 for severe aortic stenosis, Code duringthat admission (recovered) complete heart block s/p pacemaker placement in 02/2018, HFpEF, moderate mitral valve stenosis, hypothyroidism, hypertension, hyperlipidemia., CVA with residual r hemiparesis  Clinical Impression  Pt admitted with above diagnosis. Pt currently with functional limitations due to the deficits listed below (see PT Problem List). Comes from private residence apartment in [place]; Present overall moving well, but with a +SBP drop between sitting and standing; At this point, I believe dc home with [facility] therapies is a very viable option for Ms. Julie Mcgee -- We will watch for progress, especially given upcoming decisions re: suregery;  Pt will benefit from skilled PT to increase their independence and safety with mobility to allow discharge to the venue listed below.      05/21/18 0911  Vital Signs  Patient Position (if appropriate) Orthostatic Vitals  Orthostatic Lying   BP- Lying 130/58  Pulse- Lying 69  Orthostatic Sitting  BP- Sitting 131/51  Pulse- Sitting 73  Orthostatic Standing at 0 minutes  BP- Standing at 0 minutes 106/77  Pulse- Standing at 0 minutes 76  Orthostatic Standing at 3 minutes  BP- Standing at 3 minutes 110/61  Pulse- Standing at 3 minutes 79       Follow Up Recommendations Home health PT;Supervision/Assistance - 24 hour   Her husband uses a RW; I noted she is active with [facility] -- it may be worth considering the [facility] program for 24 hour assist  if she has surgery and doesn't do well post op    Equipment Recommendations  None recommended by PT(seems pretty well-equipped)    Recommendations for Other Services Other (comment)(will consider OT order postop, if she opts for surgery)     Precautions / Restrictions Precautions Precautions: Fall Restrictions Weight Bearing Restrictions: No      Mobility  Bed Mobility Overal bed mobility: Needs Assistance Bed Mobility: Supine to Sit     Supine to sit: Min guard     General bed mobility comments: Minguard for safety  Transfers Overall transfer level: Needs assistance Equipment used: Rolling walker (2 wheeled) Transfers: Sit to/from Stand Sit to Stand: Min guard         General transfer comment: Power up without assist; minguard for safety  Ambulation/Gait Ambulation/Gait assistance: Counsellor (Feet): (Pivotal steps bed to chair) Assistive device: Rolling walker (2 wheeled)       General Gait Details: Cues to self-monitor for activity tolerance because of sysitoloc BP drop  Stairs            Wheelchair Mobility    Modified Rankin (Stroke Patients Only)       Balance Overall balance assessment: Needs assistance           Standing balance-Leahy Scale: Fair                               Pertinent Vitals/Pain Pain Assessment: No/denies pain    Home Living Family/patient expects to be discharged to:: Private residence Living Arrangements: Spouse/significant other  Available Help at Discharge: Family;Available 24 hours/day Type of Home: Independent living facility Home Access: Level entry     Home Layout: One level Home Equipment: Walker - 2 wheels;Walker - 4 wheels;Cane - single point;Bedside commode;Grab bars - tub/shower      Prior Function Level of Independence: Independent with assistive device(s)         Comments: Pt used RW outdoors and rollator indoors     Hand Dominance   Dominant Hand:  Right    Extremity/Trunk Assessment   Upper Extremity Assessment Upper Extremity Assessment: Overall WFL for tasks assessed(for simple tasks)    Lower Extremity Assessment Lower Extremity Assessment: Generalized weakness       Communication   Communication: No difficulties  Cognition Arousal/Alertness: Awake/alert Behavior During Therapy: WFL for tasks assessed/performed Overall Cognitive Status: Within Functional Limits for tasks assessed(for simple mobility tasks assessed)                                        General Comments      Exercises     Assessment/Plan    PT Assessment Patient needs continued PT services  PT Problem List Decreased strength;Decreased activity tolerance;Decreased balance;Decreased mobility;Decreased coordination;Decreased knowledge of use of DME;Decreased safety awareness;Decreased knowledge of precautions       PT Treatment Interventions DME instruction;Gait training;Stair training;Functional mobility training;Therapeutic activities;Therapeutic exercise;Balance training;Neuromuscular re-education;Cognitive remediation;Patient/family education    PT Goals (Current goals can be found in the Care Plan section)  Acute Rehab PT Goals Patient Stated Goal: to get home well PT Goal Formulation: With patient Time For Goal Achievement: 06/04/18 Potential to Achieve Goals: Good    Frequency Min 3X/week   Barriers to discharge Other (comment) Clearly and adamantly declining post-acute rehab at [facility]; Will need to continue assessing function, and make more definitive dc recs after possible surgery    Co-evaluation               AM-PAC PT "6 Clicks" Mobility  Outcome Measure Help needed turning from your back to your side while in a flat bed without using bedrails?: None Help needed moving from lying on your back to sitting on the side of a flat bed without using bedrails?: A Little Help needed moving to and from a bed to a  chair (including a wheelchair)?: A Little Help needed standing up from a chair using your arms (e.g., wheelchair or bedside chair)?: A Little Help needed to walk in hospital room?: A Little Help needed climbing 3-5 steps with a railing? : A Little 6 Click Score: 19    End of Session Equipment Utilized During Treatment: Gait belt Activity Tolerance: Patient tolerated treatment well Patient left: in chair;with call bell/phone within reach;with chair alarm set Nurse Communication: Mobility status PT Visit Diagnosis: Unsteadiness on feet (R26.81);Other abnormalities of gait and mobility (R26.89)    Time: 5621-3086 PT Time Calculation (min) (ACUTE ONLY): 19 min   Charges:   PT Evaluation $PT Eval Moderate Complexity: [place], [place]  Acute Rehabilitation Services Pager [phone] Office [phone]   [place] 05/21/2018, 11:29 AM

## 2018-05-21 NOTE — Progress Notes (Addendum)
Family Medicine Teaching Service Daily Progress Note Intern Pager: [phone]  [name] Medical record number: [id number] Date of birth: 1933/03/08 Age: 85 y.o. Gender: female  Primary Care Provider: Lyman Bishop, DO Consultants: GI, Cardiology Code Status: Full  Pt Overview and Major Events to Date:  5/6 Admitted for anemia 2/2 GI bleed 5/8 EGD/Colonoscopy performed with showed colonic mass concerning for cancer  Assessment and Plan: Julie Mcgee a 85 y.o.femalepresenting symptomatic anemia to 5.8, found to have a colon mass concerning for malignancy. PMH is significant forrecent TAVR in 02/2018 for severe aortic stenosis, complete heart block s/p pacemaker placement in 02/2018, HFpEF, moderate mitral valve stenosis, hypothyroidism, hypertension, hyperlipidemia.  Acute anemia in the setting of newly found a sending colonic mass  Frond-like/villous, nonobstructing acsending colonic mass with oozing suspicious for malignancy seen on colonoscopy on 5/8.  EGD was unremarkable.  Hemoglobin remaining stable at 7.7 on 5/10. Leukocytosis has resolved.  Updated son over the phone who would like for all doctors to have him on speaker as he is healthcare power of attorney for patient and would like to be involved in her health management. Julie Mcgee "Julie" Mcgee, son, [phone] -GI following, appreciate recommendations.  Will discuss endoscopic resection versus hemicolectomy on Monday 5/11.  Surgery has been consulted by them and is currently involved in following.   -Continue holding home aspirin and Plavix, SCDs in place -Monitor CBC -Follow-up pathology  -Son who is [facility] POA should be allowed to participate in patient's discussion with surgery after pathology results have been obtained.  Discussed with charge nurse who will allow visitation rights for a meeting between surgery and patient and son during [place].   Aorticseverestenosis s/p TAVRFebruary 2020,complicated by  cardiac arrest:Stable. Home aspirin and Plavix on hold currently for above.  TAVR functioning appropriately.  -Follow-up cardiology recommendations, patient is moderate risk for surgery but has been cleared by cardiology -Holding home aspirin and Plavix in setting of above  Chronic HFpEF with EF 60-65%: Stable. Patient appears euvolemic on exam.  -Cardiology on board, appreciate recommendations -Continue home Lasix 20 mg daily -Blood pressure control as below  Complete heart block, s/p pacemaker placement:Chronic, stable. Pacemaker placed in 02/2018. HR in 60s-70s - Monitor telemetry - Continue home metoprolol  Previous CVA: Chronic, stable. - Hold home aspirin for above - Continue home statin  Hypothyroidism: Chronic, stable. TSH 3.3 in 02/2018.  - Continue home Synthroid 125 mcg  Hypertension: Chronic, stable. SBP 100-120s overnight.  Dose of metoprolol held overnight. -Monitor BP - Continue home Lopressor 50 mg twice daily  Hyperlipidemia: Chronic, stable. Takes simvastatin 80 mg daily. - Continue statin, substitute with atorvastatin formulary  Perirectal skin breakdown:Acute, improving. - Barrier cream as needed - Continue to monitor  FEN/GI: Full liquid diet per GI  Prophylaxis:SCDs  Disposition: Awaiting pathology, considerations for further interventions  Subjective:  Spoke with patient and son over speaker phone today.  Patient has just worked with physical therapy.  Patient noted to have positive orthostatics on exam however she was asymptomatic so PT was unable to walk with her.  Son is asking if she could walk as much as possible so that they can avoid putting her in an SNF.  Son would like to be very involved with her decision making and is asking if he could have visiting privileges in the hospital in order to have the discussion with surgery and GI regarding plan for his mother.   She states that she is not short of breath or  having any chest  pain.  She denies any dizziness or lightheadedness upon standing.  She states that she is feeling great and is just anxious to find out with the pathology results are so that she may know if she is going to have surgery and what kind of surgery.  Objective: Temp:  [98.3 F (36.8 C)-99 F (37.2 C)] 99 F (37.2 C) (05/10 0333) Pulse Rate:  [64-68] 64 (05/10 0333) Resp:  [16-20] 16 (05/10 0333) BP: (117-147)/(43-56) 121/43 (05/10 0333) SpO2:  [96 %-97 %] 97 % (05/10 0333) Weight:  [83 kg] 83 kg (05/10 0245) Physical Exam: General: NAD, pleasant, sitting up in a chair Cardiovascular: RRR, no m/r/g, no LE edema Respiratory: CTA BL, normal work of breathing MSK: moves 4 extremities equally Derm: no rashes appreciated Neuro: CN II-XII grossly intact Psych: AOx3, appropriate affect  Laboratory: Recent Labs  Lab 05/19/18 2253 05/20/18 0410 05/21/18 0657  WBC 17.9* 13.1* 8.1  HGB 7.5* 7.2* 7.7*  HCT 24.5* 24.5* 26.2*  PLT 379 374 389   Recent Labs  Lab 05/17/18 1244 05/18/18 0431 05/19/18 0308 05/20/18 0410  NA 134* 139 140 136  K 4.4 4.9 3.8 3.8  CL 105 104 106 102  CO2 20* 24 23 24   BUN 20 15 11 9   CREATININE 0.88 0.85 0.85 0.85  CALCIUM 9.1 8.9 8.8* 8.7*  PROT 6.2*  --  5.4*  --   BILITOT 0.4  --  1.1  --   ALKPHOS 66  --  64  --   ALT 7  --  9  --   AST 18  --  16  --   GLUCOSE 102* 91 92 100*    Imaging/Diagnostic Tests: No results found.  Julie Mcgee, [place], DO 05/21/2018, 9:01 AM PGY-2, [place] Intern pager: [phone], text pages welcome

## 2018-05-22 ENCOUNTER — Encounter (HOSPITAL_COMMUNITY): Payer: Self-pay | Admitting: Gastroenterology

## 2018-05-22 DIAGNOSIS — C182 Malignant neoplasm of ascending colon: Principal | ICD-10-CM

## 2018-05-22 LAB — CBC
HCT: 27.6 % — ABNORMAL LOW (ref 36.0–46.0)
Hemoglobin: 8.2 g/dL — ABNORMAL LOW (ref 12.0–15.0)
MCH: 26 pg (ref 26.0–34.0)
MCHC: 29.7 g/dL — ABNORMAL LOW (ref 30.0–36.0)
MCV: 87.6 fL (ref 80.0–100.0)
Platelets: 418 10*3/uL — ABNORMAL HIGH (ref 150–400)
RBC: 3.15 MIL/uL — ABNORMAL LOW (ref 3.87–5.11)
RDW: 18.8 % — ABNORMAL HIGH (ref 11.5–15.5)
WBC: 8.3 10*3/uL (ref 4.0–10.5)
nRBC: 0 % (ref 0.0–0.2)

## 2018-05-22 LAB — BASIC METABOLIC PANEL
Anion gap: 7 (ref 5–15)
BUN: 7 mg/dL — ABNORMAL LOW (ref 8–23)
CO2: 25 mmol/L (ref 22–32)
Calcium: 8.8 mg/dL — ABNORMAL LOW (ref 8.9–10.3)
Chloride: 106 mmol/L (ref 98–111)
Creatinine, Ser: 0.86 mg/dL (ref 0.44–1.00)
GFR calc Af Amer: >60 mL/min (ref >60)
GFR calc non Af Amer: >60 mL/min (ref >60)
Glucose, Bld: 95 mg/dL (ref 70–99)
Potassium: 4.7 mmol/L (ref 3.5–5.1)
Sodium: 138 mmol/L (ref 135–145)

## 2018-05-22 LAB — CEA: CEA: 1.6 ng/mL (ref 0.0–4.7)

## 2018-05-22 MED ORDER — POLYETHYLENE GLYCOL 3350 17 GM/SCOOP PO POWD
1.0000 | Freq: Once | ORAL | Status: AC
  Administered 2018-05-23: 255 g via ORAL
  Filled 2018-05-22: qty 255

## 2018-05-22 MED ORDER — DIPHENHYDRAMINE-ZINC ACETATE 2-0.1 % EX CREA
TOPICAL_CREAM | Freq: Three times a day (TID) | CUTANEOUS | Status: DC | PRN
  Administered 2018-05-22: 06:00:00 via TOPICAL
  Filled 2018-05-22: qty 28

## 2018-05-22 MED ORDER — BISACODYL 5 MG PO TBEC
20.0000 mg | DELAYED_RELEASE_TABLET | Freq: Once | ORAL | Status: AC
  Administered 2018-05-23: 20 mg via ORAL
  Filled 2018-05-22: qty 4

## 2018-05-22 MED ORDER — METRONIDAZOLE 500 MG PO TABS
1000.0000 mg | ORAL_TABLET | Freq: Three times a day (TID) | ORAL | Status: AC
  Administered 2018-05-23 (×3): 1000 mg via ORAL
  Filled 2018-05-22 (×3): qty 2

## 2018-05-22 MED ORDER — NEOMYCIN SULFATE 500 MG PO TABS
1000.0000 mg | ORAL_TABLET | Freq: Three times a day (TID) | ORAL | Status: AC
  Administered 2018-05-23 (×3): 1000 mg via ORAL
  Filled 2018-05-22 (×4): qty 2

## 2018-05-22 NOTE — Progress Notes (Signed)
Physical Therapy Treatment Patient Details Name: Julie Mcgee MRN: [id number] DOB: 09-Jul-1933 Today's Date: 05/22/2018    History of Present Illness Julie Mcgee is a 85 y.o. female presenting with a 1-2-week history of darker stools and lightheadedness, found to have a hemoglobin of 5.8 on arrival, concerning for an acute GI bleed. Mass found, concerning for malignancy; they are considering surgery; PMH is significant for recent TAVR in 02/2018 for severe aortic stenosis, Code duringthat admission (recovered) complete heart block s/p pacemaker placement in 02/2018, HFpEF, moderate mitral valve stenosis, hypothyroidism, hypertension, hyperlipidemia., CVA with residual r hemiparesis    PT Comments    Symptomatic for dizziness after 3-5 minutes of upright activity, in this case, walking in the hallway;   Serial BPs as follows:    05/22/18 0900 05/22/18 0930 05/22/18 0940  Vital Signs  Pulse Rate 68 72 73  BP (!) 114/42 (!) 99/37 (!) 86/41  Patient Position (if appropriate) Lying Standing (after walking 5 ft) Standing (after walking hallways, approx 3-5 minutes upright activity)  *Symptomatic for dizziness*   She will need to have better activity tolerance in order to dc home; Consider TED hose and eat every meal OOB in the chair, as well as hallway walking with staff CLOSELY watching for signs of orthostasis, and chair behind for safety;     Follow Up Recommendations        Equipment Recommendations  None recommended by PT(seems pretty well-equipped)    Recommendations for Other Services OT consult(for ADLs, and input in dc decision-making)     Precautions / Restrictions Precautions Precautions: Fall Precaution Comments: BP drop with incr time upright standing Restrictions Weight Bearing Restrictions: No    Mobility  Bed Mobility Overal bed mobility: Needs Assistance Bed Mobility: Supine to Sit     Supine to sit: Supervision     General bed mobility  comments: Supervision for safety  Transfers Overall transfer level: Needs assistance Equipment used: Rolling walker (2 wheeled) Transfers: Sit to/from Stand Sit to Stand: Min guard         General transfer comment: Power up without assist; minguard for safety, and cues to self-monitor for activity tolerance  Ambulation/Gait Ambulation/Gait assistance: Min guard(with and without physical contact) Gait Distance (Feet): 60 Feet(x2, with seated rest break) Assistive device: Rolling walker (2 wheeled) Gait Pattern/deviations: Step-through pattern     General Gait Details: Max cues to self-monitor for activity tolerance, and recliner pulled behind for safety; after about 50 feet, noted pt less talkative, stopped and took standing BP, and while BP was taking, she reported dizziness and had to sit; another bout of walking back to her room, about the same distance before sympotomatic again for dizziness; this time, able to get a BP while standing: 86/41   Stairs             Wheelchair Mobility    Modified Rankin (Stroke Patients Only)       Balance             Standing balance-Leahy Scale: Fair(decr standing tolerance)                              Cognition Arousal/Alertness: Awake/alert Behavior During Therapy: WFL for tasks assessed/performed Overall Cognitive Status: Impaired/Different from baseline Area of Impairment: Safety/judgement                         Safety/Judgement: Decreased awareness of safety;Decreased awareness  of deficits     General Comments: Took extra time to talk about safety concerns with BP drop with standing/walking 3-5 minutes; After the talk, she asked if she can walk the hallways later -- I emphasized that she MUST have staff near and recliner pulle dbehind      Exercises      General Comments General comments (skin integrity, edema, etc.): See other PT note of this date      Pertinent Vitals/Pain Pain  Assessment: No/denies pain    Home Living                      Prior Function            PT Goals (current goals can now be found in the care plan section) Acute Rehab PT Goals Patient Stated Goal: to get home well PT Goal Formulation: With patient Time For Goal Achievement: 06/04/18 Potential to Achieve Goals: Good Progress towards PT goals: Progressing toward goals(but grossly limited activity tolerance and orthostatic hypotension)    Frequency    Min 3X/week      PT Plan Other (comment)(DC plan will be contingent upon activity tolerance and medical decision-making, I.e. surgery or not)    Co-evaluation              AM-PAC PT "6 Clicks" Mobility   Outcome Measure  Help needed turning from your back to your side while in a flat bed without using bedrails?: None Help needed moving from lying on your back to sitting on the side of a flat bed without using bedrails?: A Little Help needed moving to and from a bed to a chair (including a wheelchair)?: A Little Help needed standing up from a chair using your arms (e.g., wheelchair or bedside chair)?: A Little Help needed to walk in hospital room?: A Little Help needed climbing 3-5 steps with a railing? : A Lot 6 Click Score: 18    End of Session Equipment Utilized During Treatment: Gait belt Activity Tolerance: Other (comment)(limited by orthostatic hypotension) Patient left: in chair;with call bell/phone within reach;with chair alarm set Nurse Communication: Mobility status;Other (comment)(notified NT that she [facility]) PT Visit Diagnosis: Unsteadiness on feet (R26.81);Other abnormalities of gait and mobility (R26.89)     Time: 0908(start time is approximate)-0946 PT Time Calculation (min) (ACUTE ONLY): 38 min  Charges:  $Gait Training: 23-37 mins $Therapeutic Activity: 8-22 mins                     Roney Marion, PT  Acute Rehabilitation Services Pager [phone] Office  [place] 05/22/2018, 10:50 AM

## 2018-05-22 NOTE — Progress Notes (Addendum)
Physical Therapy Note  Symptomatic for dizziness after 3-5 minutes of upright activity, in this case, walking in the hallway;   Serial BPs as follows:    05/22/18 0900 05/22/18 0930 05/22/18 0940  Vital Signs  Pulse Rate 68 72 73  BP (!) 114/42 (!) 99/37 (!) 86/41  Patient Position (if appropriate) Lying Standing (after walking 5 ft) Standing (after walking hallways, approx 3-5 minutes upright activity)  *Symptomatic for dizziness*   She will need to have better activity tolerance in order to dc home; Consider TED hose and eat every meal OOB in the chair, as well as hallway walking with staff CLOSELY watching for signs of orthostasis, and chair behind for safety;   (full PT treatment note to follow)  Roney Marion, [place] Pager [phone] Office [phone]

## 2018-05-22 NOTE — Progress Notes (Cosign Needed)
Family Medicine Teaching Service Daily Progress Note Intern Pager: [phone]  [name] Medical record number: [id number] Date of birth: [birth year] Age: 85 y.o. Gender: female  Primary Care Provider: Lyman Bishop, DO Consultants: GI, gen surg Code Status: full  Pt Overview and Major Events to Date:    Assessment and Plan: Julie Munrois a 85 y.o.femalepresentingsymptomatic anemia to 5.8, found to have a colon mass concerning for malignancy. PMH is significant forrecent TAVR in 02/2018 for severe aortic stenosis, complete heart block s/p pacemaker placement in 02/2018, HFpEF, moderate mitral valve stenosis, hypothyroidism, hypertension, hyperlipidemia.  Acute anemia in the setting of newly found ascending colonic mass  Hgb:7.5>>7.7>8.2.  Frond-like/villous,nonobstructing acsending colonic mass with oozing suspicious for malignancy seen on colonoscopy on 5/8.  EGD was unremarkable. son is HCPOA and would like to be called when speaking with pt. William"[name]" [place], [phone] -GI following, appreciate recommendations. Will discuss  endoscopic resection versus hemicolectomy today.  -Continue holding home aspirin and Plavix, SCDs in place -Monitor CBC -Follow-up pathology  -Son who is [facility] POA should be allowed to participate in patient's discussion with surgery after pathology results have been obtained.  Discussed with charge nurse who will allow visitation rights for a meeting between surgery and patient and son during [place].   Aorticseverestenosis s/p TAVRFebruary 2020,complicated by cardiac arrest:Stable.  TAVR functioning appropriately. -Follow-up cardiology recommendations, patient is moderate risk for surgery but has been cleared by cardiology -Holding home aspirin and Plavix in setting of above  Chronic HFpEF with EF 60-65%:Stable. Patient appears euvolemic on exam.  -Cardiology on board, appreciate recommendations -Continue home Lasix  20 mg daily -Blood pressure control as below  Complete heart block, s/p pacemaker placement:Chronic, stable. Pacemaker placed in 02/2018. HR in60s-70s - Monitor telemetry - Continue home metoprolol tartrate 50mg  BID  Previous CVA: Chronic, stable. - Hold home aspirin for above - Continue home statin  Hypothyroidism: Chronic, stable. TSH 3.3 in 02/2018.  - Continue home Synthroid125 mcg  Hypertension: Chronic, stable. 132/58.  -Monitor BP - Continue home Lopressor 50 mg twice daily  Hyperlipidemia: Chronic, stable. Takes simvastatin 80 mg daily. - Continue statin, substitute with atorvastatin formulary  Perirectal skin breakdown:Acute, improving. - Barrier cream as needed - Continue to monitor  FEN/GI:Full liquid diet per GI  Prophylaxis:SCDs  Disposition: pending, likely SNF  Subjective:  Pt not having abdominal pain.  Has not had a BM lately but states it is because she is only taking in liquid.  Pt says she sat up in her chair yesterday with the help of PT.    Objective: Temp:  [98.7 F (37.1 C)-99 F (37.2 C)] 99 F (37.2 C) (05/11 0422) Pulse Rate:  [61-63] 63 (05/11 0422) Resp:  [18] 18 (05/11 0422) BP: (121-133)/(51-58) 132/58 (05/11 0422) SpO2:  [94 %-97 %] 94 % (05/11 0422) Weight:  [82.8 kg] 82.8 kg (05/11 0422) Physical Exam: General: alert.  No acute distress.   Cardiovascular: regular rhythm. Normal rate. No murmurs.  Respiratory: LCTAB. No wheezes or crackles.  Abdomen: soft, nontender but patient has mild guarding with palpation.  NBS.   Laboratory: Recent Labs  Lab 05/19/18 2253 05/20/18 0410 05/21/18 0657  WBC 17.9* 13.1* 8.1  HGB 7.5* 7.2* 7.7*  HCT 24.5* 24.5* 26.2*  PLT 379 374 389   Recent Labs  Lab 05/17/18 1244 05/18/18 0431 05/19/18 0308 05/20/18 0410  NA 134* 139 140 136  K 4.4 4.9 3.8 3.8  CL 105 104 106 102  CO2 20* 24 23 24  BUN 20 15 11 9   CREATININE 0.88 0.85 0.85 0.85  CALCIUM 9.1 8.9 8.8* 8.7*   PROT 6.2*  --  5.4*  --   BILITOT 0.4  --  1.1  --   ALKPHOS 66  --  64  --   ALT 7  --  9  --   AST 18  --  16  --   GLUCOSE 102* 91 92 100*    Imaging/Diagnostic Tests:   Benay Pike, MD 05/22/2018, 5:54 AM PGY-1, [place] Intern pager: [phone], text pages welcome

## 2018-05-22 NOTE — Progress Notes (Signed)
Patient ID: Julie Mcgee, female   DOB: 15-Mar-1933, 85 y.o.   MRN: [id number]    Progress Note   Subjective     Colon bx- invasive adenocarcinoma Hgb 8.2 stable CEA - pending  CT Abd/pelvis-no evidence  of metastatic disease in abdomen Ct Chest - 2.6 cm lobulated Right breast  Mass, and enlarged mediastinal and hilar  Nodes, largest pre tracheal node 1.9 cm    Objective   Vital signs in last 24 hours: Temp:  [98.2 F (36.8 C)-99 F (37.2 C)] 98.2 F (36.8 C) (05/11 1009) Pulse Rate:  [59-73] 59 (05/11 1009) Resp:  [18] 18 (05/11 1009) BP: (86-133)/(37-58) 105/49 (05/11 1009) SpO2:  [94 %-97 %] 97 % (05/11 1009) Weight:  [82.8 kg] 82.8 kg (05/11 0422) Last BM Date: 05/19/18 General:  elderly  white female in NAD, sl dysarthria Heart:  Regular rate and rhythm; no murmurs Lungs: Respirations even and unlabored, lungs CTA bilaterally Abdomen:  Soft, nontender and nondistended. Normal bowel sounds. Extremities:  Without edema. Neurologic:  Alert and oriented,  grossly normal neurologically. Psych:  Cooperative. Normal mood and affect.  Intake/Output from previous day: 05/10 0701 - 05/11 0700 In: 549 [P.O.:549] Out: 500 [Urine:500] Intake/Output this shift: Total I/O In: 300 [P.O.:300] Out: -   Lab Results: Recent Labs    05/20/18 0410 05/21/18 0657 05/22/18 0654  WBC 13.1* 8.1 8.3  HGB 7.2* 7.7* 8.2*  HCT 24.5* 26.2* 27.6*  PLT 374 389 418*   BMET Recent Labs    05/20/18 0410 05/22/18 0654  NA 136 138  K 3.8 4.7  CL 102 106  CO2 24 25  GLUCOSE 100* 95  BUN 9 7*  CREATININE 0.85 0.86  CALCIUM 8.7* 8.8*   LFT No results for input(s): PROT, ALBUMIN, AST, ALT, ALKPHOS, BILITOT, BILIDIR, IBILI in the last 72 hours. PT/INR No results for input(s): LABPROT, INR in the last 72 hours.  Studies/Results: Ct Chest W Contrast  Result Date: 05/20/2018 CLINICAL DATA:  Possible GI bleed, right colon mass, probable malignancy EXAM: CT CHEST, ABDOMEN, AND PELVIS  WITH CONTRAST TECHNIQUE: Multidetector CT imaging of the chest, abdomen and pelvis was performed following the standard protocol during bolus administration of intravenous contrast. CONTRAST:  140mL OMNIPAQUE IOHEXOL 300 MG/ML  SOLN COMPARISON:  CT chest abdomen pelvis TAVR protocol, 02/24/2018 FINDINGS: CT CHEST FINDINGS Cardiovascular: Interval placement of aortic valve stent endograft. Cardiomegaly with gross enlargement of the left atrium and dense calcification of the mitral valve and annulus. Coronary artery calcifications. Left chest multi lead pacer. No pericardial effusion. Mediastinum/Nodes: Enlarged mediastinal and hilar lymph nodes, unchanged from prior examination, largest pretracheal node measuring 1.9 cm in short axis (series 3, image 23). Thyroid gland, trachea, and esophagus demonstrate no significant findings. Lungs/Pleura: Moderate right, small left pleural effusions and associated atelectasis or consolidation. There is mild, diffuse interlobular septal thickening, most consistent with edema. Musculoskeletal: There is a lobulated 2.6 cm mass in the central right breast (series 3, image 39). CT ABDOMEN PELVIS FINDINGS Hepatobiliary: No focal liver abnormality is seen. No gallstones, gallbladder wall thickening, or biliary dilatation. Pancreas: Unremarkable. No pancreatic ductal dilatation or surrounding inflammatory changes. Spleen: Normal in size without focal abnormality. Adrenals/Urinary Tract: Adrenal glands are unremarkable. Tiny nonobstructive right renal calculi. Bladder is unremarkable. Stomach/Bowel: Stomach is within normal limits. Focal soft tissue thickening of the colon wall superior to the ileocecal valve may reflect reported ascending colon mass. There is extensive fatty mural stratification of the cecum. Vascular/Lymphatic: Severe calcific atherosclerosis of  the abdominal aorta and branch vessels. No enlarged abdominal or pelvic lymph nodes. Reproductive: No mass or other  abnormality. Other: No abdominal wall hernia or abnormality. No abdominopelvic ascites. Musculoskeletal: Redemonstrated mild wedge deformity of the T12 vertebral body. IMPRESSION: 1. No CT findings of the abdomen or pelvis to localize suspected GI bleeding. No intraluminal contrast noted. 2. Focal soft tissue thickening of the colon wall superior to the ileocecal valve may reflect reported ascending colon mass. 3. There is extensive fatty mural stratification of the cecum, suggesting chronic inflammatory sequelae of colitis, including Crohn's disease. 4. Moderate right, small left pleural effusions and associated atelectasis or consolidation. There is mild, diffuse interlobular septal thickening, most consistent with edema. 5.  Interval placement of aortic valve stent endograft. 6. There is a lobulated 2.6 cm mass in the central right breast (series 3, image 39), as seen on prior examination. Correlate with mammography. 7.  Other chronic and incidental findings as detailed above. Electronically Signed   By: Eddie Candle M.D.   On: 05/20/2018 16:53   Ct Abdomen Pelvis W Contrast  Result Date: 05/20/2018 CLINICAL DATA:  Possible GI bleed, right colon mass, probable malignancy EXAM: CT CHEST, ABDOMEN, AND PELVIS WITH CONTRAST TECHNIQUE: Multidetector CT imaging of the chest, abdomen and pelvis was performed following the standard protocol during bolus administration of intravenous contrast. CONTRAST:  123mL OMNIPAQUE IOHEXOL 300 MG/ML  SOLN COMPARISON:  CT chest abdomen pelvis TAVR protocol, 02/24/2018 FINDINGS: CT CHEST FINDINGS Cardiovascular: Interval placement of aortic valve stent endograft. Cardiomegaly with gross enlargement of the left atrium and dense calcification of the mitral valve and annulus. Coronary artery calcifications. Left chest multi lead pacer. No pericardial effusion. Mediastinum/Nodes: Enlarged mediastinal and hilar lymph nodes, unchanged from prior examination, largest pretracheal node  measuring 1.9 cm in short axis (series 3, image 23). Thyroid gland, trachea, and esophagus demonstrate no significant findings. Lungs/Pleura: Moderate right, small left pleural effusions and associated atelectasis or consolidation. There is mild, diffuse interlobular septal thickening, most consistent with edema. Musculoskeletal: There is a lobulated 2.6 cm mass in the central right breast (series 3, image 39). CT ABDOMEN PELVIS FINDINGS Hepatobiliary: No focal liver abnormality is seen. No gallstones, gallbladder wall thickening, or biliary dilatation. Pancreas: Unremarkable. No pancreatic ductal dilatation or surrounding inflammatory changes. Spleen: Normal in size without focal abnormality. Adrenals/Urinary Tract: Adrenal glands are unremarkable. Tiny nonobstructive right renal calculi. Bladder is unremarkable. Stomach/Bowel: Stomach is within normal limits. Focal soft tissue thickening of the colon wall superior to the ileocecal valve may reflect reported ascending colon mass. There is extensive fatty mural stratification of the cecum. Vascular/Lymphatic: Severe calcific atherosclerosis of the abdominal aorta and branch vessels. No enlarged abdominal or pelvic lymph nodes. Reproductive: No mass or other abnormality. Other: No abdominal wall hernia or abnormality. No abdominopelvic ascites. Musculoskeletal: Redemonstrated mild wedge deformity of the T12 vertebral body. IMPRESSION: 1. No CT findings of the abdomen or pelvis to localize suspected GI bleeding. No intraluminal contrast noted. 2. Focal soft tissue thickening of the colon wall superior to the ileocecal valve may reflect reported ascending colon mass. 3. There is extensive fatty mural stratification of the cecum, suggesting chronic inflammatory sequelae of colitis, including Crohn's disease. 4. Moderate right, small left pleural effusions and associated atelectasis or consolidation. There is mild, diffuse interlobular septal thickening, most consistent  with edema. 5.  Interval placement of aortic valve stent endograft. 6. There is a lobulated 2.6 cm mass in the central right breast (series 3, image 39), as  seen on prior examination. Correlate with mammography. 7.  Other chronic and incidental findings as detailed above. Electronically Signed   By: Eddie Candle M.D.   On: 05/20/2018 16:53       Assessment / Plan:    #38  85 year old white female admitted with acute GI bleed setting of aspirin and Plavix. Colonoscopy on 05/19/2018 revealed an ascending colon mass.  Path has returned this morning showing an invasive adenocarcinoma.  Surgery has been following  #2 anemia acute and subacute secondary to GI blood loss #3 status post total aortic valve replacement February [id number] complicated by VF arrest postoperatively secondary to complete heart block and patient is now status post permanent pacemaker- on dual antiplatelet rx  #4congestive heart failure-stable # 5 history of CVA right hemiparesis #6 peripheral vascular disease #7 abnormal CT of the chest with 2.6 cm lobulated breast mass, and mediastinal and hilar adenopathy which was also noted on angiography in February 2020  Plan; Surgery has been following awaiting path and plans right hemicolectomy. Will discuss chest CT findings, further with team.         Active Problems:   Acute on chronic diastolic (congestive) heart failure (HCC)   S/P TAVR (transcatheter aortic valve replacement)   Acute upper GI bleed   Gastrointestinal hemorrhage   Anticoagulated   Colonic mass   Hiatal hernia     LOS: 5 days   Tahra Hitzeman EsterwoodPA-C  05/22/2018, 11:01 AM

## 2018-05-22 NOTE — Progress Notes (Signed)
  Biopsy results returned today showing evidence of invasive adenocarcinoma. I spoke with the patient and her son (via telephone) this afternoon regarding this. Not amenable to endoscopic resection, therefore would recommend partial colectomy +/- colostomy.  Cardiology has cleared the patient for surgery with moderate risk, and she has been off her plavix for 5 days. Patient was also found to have a right breast mass which will require further workup, however, this does not change the fact that Ms. Vanzandt has a colon mass that will likely re-bleed upon restarting her plavix/aspirin. Agree with an oncology consult. Tentatively plan for a bowel prep tomorrow and surgery on Wednesday 5/13.   Wellington Hampshire, [facility] Surgery 05/22/2018, 3:54 PM Pager: [phone] Mon 7:00 am -11:30 AM Tues-Fri 7:00 am-4:30 pm Sat-Sun 7:00 am-11:30 am

## 2018-05-22 NOTE — Progress Notes (Signed)
Spoke with GI who stated based on pathology results the patient would need general surgery to perform partial colectomy.  The spoke with oncology regarding the incidental breast mass and mediastinal lymph node enlargement, who stated it was unlikely this represented a metastasis of the adenocarcinoma but if we were concerned about a primary breast malignancy we can get a biopsy.  Spoke with the patient's son who is going to discuss the potential of surgery with the patient and the rest of the family and then get back to me.  He also consented to breast biopsy on patient's behalf.  I then spoke with interventional radiology who stated it would be in the patient's best interest to have this breast mass worked up as an outpatient with a mammogram before jumping straight to biopsy.  Will discuss this later with patient's son after he discusses the surgery with his family.

## 2018-05-22 NOTE — Progress Notes (Signed)
[facility] Progress Note  3 Days Post-Op  Subjective: CC-  Feeling well today. Denies abdominal pain. Denies n/v. Does not like the full liquid diet but is tolerating. Passing flatus. Last BM 5/8 after colonoscopy prep.  Objective: Vital signs in last 24 hours: Temp:  [98.7 F (37.1 C)-99 F (37.2 C)] 99 F (37.2 C) (05/11 0422) Pulse Rate:  [61-63] 63 (05/11 0422) Resp:  [18] 18 (05/11 0422) BP: (121-133)/(51-58) 132/58 (05/11 0422) SpO2:  [94 %-97 %] 94 % (05/11 0422) Weight:  [82.8 kg] 82.8 kg (05/11 0422) Last BM Date: 05/19/18  Intake/Output from previous day: 05/10 0701 - 05/11 0700 In: 549 [P.O.:549] Out: 500 [Urine:500] Intake/Output this shift: Total I/O In: 300 [P.O.:300] Out: -   PE: Gen:  Alert, NAD, pleasant HEENT: EOM's intact, pupils equal and round Card:  RRR, +murmur Pulm:  CTAB, no W/R/R, effort normal Abd: Soft, NT/ND, +BS, no HSM Ext:  Calves soft and nontender Psych: A&Ox3  Skin: no rashes noted, warm and dry  Lab Results:  Recent Labs    05/21/18 0657 05/22/18 0654  WBC 8.1 8.3  HGB 7.7* 8.2*  HCT 26.2* 27.6*  PLT 389 418*   BMET Recent Labs    05/20/18 0410 05/22/18 0654  NA 136 138  K 3.8 4.7  CL 102 106  CO2 24 25  GLUCOSE 100* 95  BUN 9 7*  CREATININE 0.85 0.86  CALCIUM 8.7* 8.8*   PT/INR No results for input(s): LABPROT, INR in the last 72 hours. CMP     Component Value Date/Time   NA 138 05/22/2018 0654   NA 138 03/16/2018 1326   K 4.7 05/22/2018 0654   CL 106 05/22/2018 0654   CO2 25 05/22/2018 0654   GLUCOSE 95 05/22/2018 0654   BUN 7 (L) 05/22/2018 0654   BUN 15 03/16/2018 1326   CREATININE 0.86 05/22/2018 0654   CALCIUM 8.8 (L) 05/22/2018 0654   PROT 5.4 (L) 05/19/2018 0308   ALBUMIN 2.9 (L) 05/19/2018 0308   AST 16 05/19/2018 0308   ALT 9 05/19/2018 0308   ALKPHOS 64 05/19/2018 0308   BILITOT 1.1 05/19/2018 0308   GFRNONAA >60 05/22/2018 0654   GFRAA >60 05/22/2018 0654   Lipase   No results found for: LIPASE     Studies/Results: Ct Chest W Contrast  Result Date: 05/20/2018 CLINICAL DATA:  Possible GI bleed, right colon mass, probable malignancy EXAM: CT CHEST, ABDOMEN, AND PELVIS WITH CONTRAST TECHNIQUE: Multidetector CT imaging of the chest, abdomen and pelvis was performed following the standard protocol during bolus administration of intravenous contrast. CONTRAST:  141mL OMNIPAQUE IOHEXOL 300 MG/ML  SOLN COMPARISON:  CT chest abdomen pelvis TAVR protocol, 02/24/2018 FINDINGS: CT CHEST FINDINGS Cardiovascular: Interval placement of aortic valve stent endograft. Cardiomegaly with gross enlargement of the left atrium and dense calcification of the mitral valve and annulus. Coronary artery calcifications. Left chest multi lead pacer. No pericardial effusion. Mediastinum/Nodes: Enlarged mediastinal and hilar lymph nodes, unchanged from prior examination, largest pretracheal node measuring 1.9 cm in short axis (series 3, image 23). Thyroid gland, trachea, and esophagus demonstrate no significant findings. Lungs/Pleura: Moderate right, small left pleural effusions and associated atelectasis or consolidation. There is mild, diffuse interlobular septal thickening, most consistent with edema. Musculoskeletal: There is a lobulated 2.6 cm mass in the central right breast (series 3, image 39). CT ABDOMEN PELVIS FINDINGS Hepatobiliary: No focal liver abnormality is seen. No gallstones, gallbladder wall thickening, or biliary dilatation. Pancreas: Unremarkable. No pancreatic  ductal dilatation or surrounding inflammatory changes. Spleen: Normal in size without focal abnormality. Adrenals/Urinary Tract: Adrenal glands are unremarkable. Tiny nonobstructive right renal calculi. Bladder is unremarkable. Stomach/Bowel: Stomach is within normal limits. Focal soft tissue thickening of the colon wall superior to the ileocecal valve may reflect reported ascending colon mass. There is extensive fatty  mural stratification of the cecum. Vascular/Lymphatic: Severe calcific atherosclerosis of the abdominal aorta and branch vessels. No enlarged abdominal or pelvic lymph nodes. Reproductive: No mass or other abnormality. Other: No abdominal wall hernia or abnormality. No abdominopelvic ascites. Musculoskeletal: Redemonstrated mild wedge deformity of the T12 vertebral body. IMPRESSION: 1. No CT findings of the abdomen or pelvis to localize suspected GI bleeding. No intraluminal contrast noted. 2. Focal soft tissue thickening of the colon wall superior to the ileocecal valve may reflect reported ascending colon mass. 3. There is extensive fatty mural stratification of the cecum, suggesting chronic inflammatory sequelae of colitis, including Crohn's disease. 4. Moderate right, small left pleural effusions and associated atelectasis or consolidation. There is mild, diffuse interlobular septal thickening, most consistent with edema. 5.  Interval placement of aortic valve stent endograft. 6. There is a lobulated 2.6 cm mass in the central right breast (series 3, image 39), as seen on prior examination. Correlate with mammography. 7.  Other chronic and incidental findings as detailed above. Electronically Signed   By: Eddie Candle M.D.   On: 05/20/2018 16:53   Ct Abdomen Pelvis W Contrast  Result Date: 05/20/2018 CLINICAL DATA:  Possible GI bleed, right colon mass, probable malignancy EXAM: CT CHEST, ABDOMEN, AND PELVIS WITH CONTRAST TECHNIQUE: Multidetector CT imaging of the chest, abdomen and pelvis was performed following the standard protocol during bolus administration of intravenous contrast. CONTRAST:  153mL OMNIPAQUE IOHEXOL 300 MG/ML  SOLN COMPARISON:  CT chest abdomen pelvis TAVR protocol, 02/24/2018 FINDINGS: CT CHEST FINDINGS Cardiovascular: Interval placement of aortic valve stent endograft. Cardiomegaly with gross enlargement of the left atrium and dense calcification of the mitral valve and annulus.  Coronary artery calcifications. Left chest multi lead pacer. No pericardial effusion. Mediastinum/Nodes: Enlarged mediastinal and hilar lymph nodes, unchanged from prior examination, largest pretracheal node measuring 1.9 cm in short axis (series 3, image 23). Thyroid gland, trachea, and esophagus demonstrate no significant findings. Lungs/Pleura: Moderate right, small left pleural effusions and associated atelectasis or consolidation. There is mild, diffuse interlobular septal thickening, most consistent with edema. Musculoskeletal: There is a lobulated 2.6 cm mass in the central right breast (series 3, image 39). CT ABDOMEN PELVIS FINDINGS Hepatobiliary: No focal liver abnormality is seen. No gallstones, gallbladder wall thickening, or biliary dilatation. Pancreas: Unremarkable. No pancreatic ductal dilatation or surrounding inflammatory changes. Spleen: Normal in size without focal abnormality. Adrenals/Urinary Tract: Adrenal glands are unremarkable. Tiny nonobstructive right renal calculi. Bladder is unremarkable. Stomach/Bowel: Stomach is within normal limits. Focal soft tissue thickening of the colon wall superior to the ileocecal valve may reflect reported ascending colon mass. There is extensive fatty mural stratification of the cecum. Vascular/Lymphatic: Severe calcific atherosclerosis of the abdominal aorta and branch vessels. No enlarged abdominal or pelvic lymph nodes. Reproductive: No mass or other abnormality. Other: No abdominal wall hernia or abnormality. No abdominopelvic ascites. Musculoskeletal: Redemonstrated mild wedge deformity of the T12 vertebral body. IMPRESSION: 1. No CT findings of the abdomen or pelvis to localize suspected GI bleeding. No intraluminal contrast noted. 2. Focal soft tissue thickening of the colon wall superior to the ileocecal valve may reflect reported ascending colon mass. 3. There is extensive  fatty mural stratification of the cecum, suggesting chronic inflammatory  sequelae of colitis, including Crohn's disease. 4. Moderate right, small left pleural effusions and associated atelectasis or consolidation. There is mild, diffuse interlobular septal thickening, most consistent with edema. 5.  Interval placement of aortic valve stent endograft. 6. There is a lobulated 2.6 cm mass in the central right breast (series 3, image 39), as seen on prior examination. Correlate with mammography. 7.  Other chronic and incidental findings as detailed above. Electronically Signed   By: Eddie Candle M.D.   On: 05/20/2018 16:53    Anti-infectives: Anti-infectives (From admission, onward)   None       Assessment/Plan S/p TAVR 07/8936 on plavix, complicated by cardiac arrest Mild acute on chronic HFpEF with EF 60-65% Complete heart block s/p Pacemaker H/o CVA Hypothyroidism HTN HLD  Ascending colon mass Symptomatic anemia - hgb 8.2 today, stable, no recent bloody BM - s/p colonoscopy 5/8, pathology pending - CEA pending - moderate risk for surgery per cardiology - Will await pathology results and make a plan once this is confirmed.  ID - none FEN - IVF, FLD VTE - SCDs Foley - none Follow up - TBD   LOS: 5 days    Wellington Hampshire , [facility] 05/22/2018, 9:46 AM Pager: [phone] Mon-Thurs 7:00 am-4:30 pm Fri 7:00 am -11:30 AM Sat-Sun 7:00 am-11:30 am

## 2018-05-23 DIAGNOSIS — N63 Unspecified lump in unspecified breast: Secondary | ICD-10-CM

## 2018-05-23 LAB — CBC WITH DIFFERENTIAL/PLATELET
Abs Immature Granulocytes: 0.03 10*3/uL (ref 0.00–0.07)
Basophils Absolute: 0 10*3/uL (ref 0.0–0.1)
Basophils Relative: 0 %
Eosinophils Absolute: 0.8 10*3/uL — ABNORMAL HIGH (ref 0.0–0.5)
Eosinophils Relative: 11 %
HCT: 26.9 % — ABNORMAL LOW (ref 36.0–46.0)
Hemoglobin: 7.9 g/dL — ABNORMAL LOW (ref 12.0–15.0)
Immature Granulocytes: 0 %
Lymphocytes Relative: 15 %
Lymphs Abs: 1.1 10*3/uL (ref 0.7–4.0)
MCH: 25.3 pg — ABNORMAL LOW (ref 26.0–34.0)
MCHC: 29.4 g/dL — ABNORMAL LOW (ref 30.0–36.0)
MCV: 86.2 fL (ref 80.0–100.0)
Monocytes Absolute: 0.7 10*3/uL (ref 0.1–1.0)
Monocytes Relative: 10 %
Neutro Abs: 4.5 10*3/uL (ref 1.7–7.7)
Neutrophils Relative %: 64 %
Platelets: 98 10*3/uL — ABNORMAL LOW (ref 150–400)
RBC: 3.12 MIL/uL — ABNORMAL LOW (ref 3.87–5.11)
RDW: 18.7 % — ABNORMAL HIGH (ref 11.5–15.5)
WBC: 7.1 10*3/uL (ref 4.0–10.5)
nRBC: 0 % (ref 0.0–0.2)

## 2018-05-23 LAB — CBC
HCT: 30.1 % — ABNORMAL LOW (ref 36.0–46.0)
Hemoglobin: 8.8 g/dL — ABNORMAL LOW (ref 12.0–15.0)
MCH: 25.4 pg — ABNORMAL LOW (ref 26.0–34.0)
MCHC: 29.2 g/dL — ABNORMAL LOW (ref 30.0–36.0)
MCV: 87 fL (ref 80.0–100.0)
Platelets: 429 10*3/uL — ABNORMAL HIGH (ref 150–400)
RBC: 3.46 MIL/uL — ABNORMAL LOW (ref 3.87–5.11)
RDW: 18.9 % — ABNORMAL HIGH (ref 11.5–15.5)
WBC: 11.6 10*3/uL — ABNORMAL HIGH (ref 4.0–10.5)
nRBC: 0 % (ref 0.0–0.2)

## 2018-05-23 LAB — BASIC METABOLIC PANEL
Anion gap: 12 (ref 5–15)
BUN: 6 mg/dL — ABNORMAL LOW (ref 8–23)
CO2: 24 mmol/L (ref 22–32)
Calcium: 8.7 mg/dL — ABNORMAL LOW (ref 8.9–10.3)
Chloride: 102 mmol/L (ref 98–111)
Creatinine, Ser: 0.81 mg/dL (ref 0.44–1.00)
GFR calc Af Amer: >60 mL/min (ref >60)
GFR calc non Af Amer: >60 mL/min (ref >60)
Glucose, Bld: 91 mg/dL (ref 70–99)
Potassium: 4.3 mmol/L (ref 3.5–5.1)
Sodium: 138 mmol/L (ref 135–145)

## 2018-05-23 LAB — DIRECT ANTIGLOBULIN TEST (NOT AT ARMC)
DAT, IgG: NEGATIVE
DAT, complement: NEGATIVE

## 2018-05-23 LAB — SAVE SMEAR(SSMR), FOR PROVIDER SLIDE REVIEW

## 2018-05-23 LAB — LACTATE DEHYDROGENASE: LDH: 154 U/L (ref 98–192)

## 2018-05-23 LAB — PROTIME-INR
INR: 1.2 (ref 0.8–1.2)
Prothrombin Time: 15.3 seconds — ABNORMAL HIGH (ref 11.4–15.2)

## 2018-05-23 LAB — FIBRINOGEN: Fibrinogen: 377 mg/dL (ref 210–475)

## 2018-05-23 LAB — APTT: aPTT: 30 seconds (ref 24–36)

## 2018-05-23 MED ORDER — SODIUM CHLORIDE 0.9 % IV SOLN
2.0000 g | INTRAVENOUS | Status: AC
  Administered 2018-05-24: 2 g via INTRAVENOUS
  Filled 2018-05-23 (×2): qty 2

## 2018-05-23 MED ORDER — CHLORHEXIDINE GLUCONATE CLOTH 2 % EX PADS
6.0000 | MEDICATED_PAD | Freq: Once | CUTANEOUS | Status: AC
  Administered 2018-05-23 – 2018-05-24 (×2): 6 via TOPICAL

## 2018-05-23 MED ORDER — ALVIMOPAN 12 MG PO CAPS
12.0000 mg | ORAL_CAPSULE | ORAL | Status: AC
  Administered 2018-05-24: 12 mg via ORAL
  Filled 2018-05-23 (×2): qty 1

## 2018-05-23 MED ORDER — CHLORHEXIDINE GLUCONATE CLOTH 2 % EX PADS
6.0000 | MEDICATED_PAD | Freq: Once | CUTANEOUS | Status: AC
  Administered 2018-05-23: 6 via TOPICAL

## 2018-05-23 MED ORDER — GABAPENTIN 300 MG PO CAPS
300.0000 mg | ORAL_CAPSULE | ORAL | Status: AC
  Administered 2018-05-24: 300 mg via ORAL
  Filled 2018-05-23: qty 1

## 2018-05-23 MED ORDER — ACETAMINOPHEN 500 MG PO TABS
1000.0000 mg | ORAL_TABLET | ORAL | Status: AC
  Administered 2018-05-24: 1000 mg via ORAL
  Filled 2018-05-23: qty 2

## 2018-05-23 NOTE — Progress Notes (Signed)
Paged by IR. IR recommends that she gets the work up outpatient at the [facility] as patient has other pressing medical issues and surgery at this time. Additionally, [facility] will be able to provide her with the full work up which would be typical in any breast cases.   Zettie Cooley, M.D.  Family Medicine  PGY-1 05/23/2018 8:08 AM

## 2018-05-23 NOTE — Care Management Important Message (Signed)
Important Message  Patient Details  Name: Julie Mcgee MRN: [id number] Date of Birth: 1933/09/18   Medicare Important Message Given:  Yes    [name] [address] 05/23/2018, 3:23 PM

## 2018-05-23 NOTE — Evaluation (Signed)
Occupational Therapy Evaluation Patient Details Name: Julie Mcgee MRN: [id number] DOB: 01/29/1933 Today's Date: 05/23/2018    History of Present Illness Fradel Baldonado is a 85 y.o. female presenting with a 1-2-week history of darker stools and lightheadedness, found to have a hemoglobin of 5.8 on arrival, concerning for an acute GI bleed. Mass found, concerning for malignancy; they are considering surgery; PMH is significant for recent TAVR in 02/2018 for severe aortic stenosis, Code duringthat admission (recovered) complete heart block s/p pacemaker placement in 02/2018, HFpEF, moderate mitral valve stenosis, hypothyroidism, hypertension, hyperlipidemia., CVA with residual r hemiparesis   Clinical Impression   Patient in bed upon therapy arrival and agreeable to participate in OT evaluation. Patient is adamant regarding returning home with Home Health versus discharging to SNF stating that it's hard to get out of a skilled nursing facility and she just wants to be home. Patient presents with decreased BUE strength and endurance, medical concerns including her BP dropping during standing functional tasks requiring increased assistance with basic ADL tasks and functional transfers. Patient is currently scheduled for surgery on Wednesday 05/24/18. Discussed discharge options and safety concerns with returning home in her current condition. Discussed possibly falling or injuring her husband if her BP dropped at home causing her to fall. During session, BP dropped after standing from [facility] with RW.  BP:  11:03: 75/42 (sitting), 11:06: 89/45 (sitting), 11:10: 105/51 (sitting), 11:18: 122/54 (seated with legs elevated in recliner). Pt will benefit from skilled OT services to focus on mentioned deficits. Will continue to monitor and assess discharge recommendation post surgery. Discussed this with patient who verbalized understanding.     Follow Up Recommendations  SNF;Home health OT;Other (comment)(Depending  on BP concerns and progress post surgery.)    Equipment Recommendations  None recommended by OT       Precautions / Restrictions Precautions Precautions: Fall Restrictions Weight Bearing Restrictions: No      Mobility Bed Mobility Overal bed mobility: Modified Independent Bed Mobility: Supine to Sit     Supine to sit: Modified independent (Device/Increase time);HOB elevated        Transfers Overall transfer level: Needs assistance Equipment used: Rolling walker (2 wheeled) Transfers: Sit to/from [facility] Sit to Stand: Min assist Stand pivot transfers: Min assist       General transfer comment: Pt initially stood without RW and was unbalanced. When asked if she would need the walker she states, "No, you're here."         ADL either performed or assessed with clinical judgement   ADL Overall ADL's : Needs assistance/impaired     Grooming: Wash/dry hands;Set up;Sitting                   Toilet Transfer: Minimal assistance;BSC;RW;Stand-pivot   Toileting- Clothing Manipulation and Hygiene: Minimal assistance;Sit to/from stand       Functional mobility during ADLs: Minimal assistance;Cueing for safety;Cueing for sequencing;Rolling walker       Vision Baseline Vision/History: Wears glasses Wears Glasses: At all times Patient Visual Report: No change from baseline              Pertinent Vitals/Pain Pain Assessment: 0-10 Pain Score: 2  Pain Location: stomach/intentines Pain Descriptors / Indicators: Cramping Pain Intervention(s): Monitored during session     Hand Dominance Left(More left handed since CVA in ~2010)   Extremity/Trunk Assessment Upper Extremity Assessment Upper Extremity Assessment: Generalized weakness   Lower Extremity Assessment Lower Extremity Assessment: Defer to PT evaluation  Communication Communication Communication: No difficulties   Cognition Arousal/Alertness: Awake/alert Behavior  During Therapy: WFL for tasks assessed/performed Overall Cognitive Status: Impaired/Different from baseline Area of Impairment: Safety/judgement         General Comments: Discussed safety concerns with returning home if BP continues to drop during standing activities. Discussed safety concerns with Husband caring for her and chance of falling and hitting her head or breaking something if she was at home.               Home Living Family/patient expects to be discharged to:: Private residence Living Arrangements: Spouse/significant other Available Help at Discharge: Family;Available 24 hours/day Type of Home: Independent living facility Home Access: Level entry     Home Layout: One level     Bathroom Shower/Tub: Occupational psychologist: Standard     Home Equipment: Environmental consultant - 2 wheels;Walker - 4 wheels;Cane - single point;Bedside commode;Grab bars - tub/shower;Shower seat          Prior Functioning/Environment Level of Independence: Independent with assistive device(s)        Comments: Pt used 2 wheeled RW outdoors and rollator indoors        OT Problem List: Decreased strength;Decreased knowledge of use of DME or AE;Decreased activity tolerance;Decreased safety awareness;Impaired balance (sitting and/or standing)      OT Treatment/Interventions: Self-care/ADL training;Manual therapy;Therapeutic exercise;Modalities;Patient/family education;Neuromuscular education;Balance training;Energy conservation;Therapeutic activities;DME and/or AE instruction    OT Goals(Current goals can be found in the care plan section) Acute Rehab OT Goals Patient Stated Goal: to get home well OT Goal Formulation: With patient Time For Goal Achievement: 06/06/18 Potential to Achieve Goals: Good  OT Frequency: Min 2X/week    AM-PAC OT "6 Clicks" Daily Activity     Outcome Measure Help from another person eating meals?: None Help from another person taking care of personal  grooming?: A Little Help from another person toileting, which includes using toliet, bedpan, or urinal?: A Lot Help from another person bathing (including washing, rinsing, drying)?: A Lot Help from another person to put on and taking off regular upper body clothing?: A Little Help from another person to put on and taking off regular lower body clothing?: A Lot 6 Click Score: 16   End of Session Equipment Utilized During Treatment: Gait belt;Rolling walker  Activity Tolerance: Patient tolerated treatment well;Other (comment)(treatment limited by BP) Patient left: in chair;with call bell/phone within reach;with chair alarm set  OT Visit Diagnosis: Muscle weakness (generalized) (M62.81)                Time: 5102-5852 OT Time Calculation (min): 55 min Charges:  OT General Charges $OT Visit: 1 Visit OT Evaluation $OT Eval Moderate Complexity: 1 Mod OT Treatments $Self Care/Home Management : 38-52 mins  Ailene Ravel, OTR/L,CBIS  [phone]   Amyr Sluder, Clarene Duke 05/23/2018, 11:36 AM

## 2018-05-23 NOTE — Progress Notes (Signed)
IR requested by Dr. Erin Hearing for possible image-guided breast mass biopsy.  Images reviewed by both Dr. Pascal Lux and Dr. Anselm Pancoast who recommend patient undergo full outpatient workup (PET, biopsy, ect.) at breast center. Will delete order. Dr. Maudie Mercury (family residency) aware.  IR available in future if needed.   Bea Graff Kaina Orengo, PA-C 05/23/2018, 8:09 AM

## 2018-05-23 NOTE — Progress Notes (Signed)
[facility] Progress Note  4 Days Post-Op  Subjective: CC-  Feeling well today. No complaints. Slept well last night. Denies abdominal pain, nausea, vomiting. No BM yesterday. Tolerating liquids.   Objective: Vital signs in last 24 hours: Temp:  [98.2 F (36.8 C)-98.6 F (37 C)] 98.3 F (36.8 C) (05/12 0401) Pulse Rate:  [59-68] 64 (05/12 0401) Resp:  [18] 18 (05/12 0401) BP: (105-123)/(29-49) 123/42 (05/12 0401) SpO2:  [96 %-99 %] 96 % (05/12 0401) Weight:  [82.8 kg] 82.8 kg (05/11 2041) Last BM Date: 05/19/18  Intake/Output from previous day: 05/11 0701 - 05/12 0700 In: 1420 [P.O.:1420] Out: 650 [Urine:650] Intake/Output this shift: Total I/O In: 0  Out: 200 [Urine:200]  PE: Gen:  Alert, NAD, pleasant HEENT: EOM's intact, pupils equal and round Card:  RRR, +murmur Pulm:  CTAB, no W/R/R, effort normal Abd: Soft, NT/ND, +BS, no HSM Ext:  Calves soft and nontender Psych: A&Ox3  Skin: no rashes noted, warm and dry   Lab Results:  Recent Labs    05/22/18 0654 05/23/18 0319  WBC 8.3 7.1  HGB 8.2* 7.9*  HCT 27.6* 26.9*  PLT 418* 98*   BMET Recent Labs    05/22/18 0654 05/23/18 0319  NA 138 138  K 4.7 4.3  CL 106 102  CO2 25 24  GLUCOSE 95 91  BUN 7* 6*  CREATININE 0.86 0.81  CALCIUM 8.8* 8.7*   PT/INR No results for input(s): LABPROT, INR in the last 72 hours. CMP     Component Value Date/Time   NA 138 05/23/2018 0319   NA 138 03/16/2018 1326   K 4.3 05/23/2018 0319   CL 102 05/23/2018 0319   CO2 24 05/23/2018 0319   GLUCOSE 91 05/23/2018 0319   BUN 6 (L) 05/23/2018 0319   BUN 15 03/16/2018 1326   CREATININE 0.81 05/23/2018 0319   CALCIUM 8.7 (L) 05/23/2018 0319   PROT 5.4 (L) 05/19/2018 0308   ALBUMIN 2.9 (L) 05/19/2018 0308   AST 16 05/19/2018 0308   ALT 9 05/19/2018 0308   ALKPHOS 64 05/19/2018 0308   BILITOT 1.1 05/19/2018 0308   GFRNONAA >60 05/23/2018 0319   GFRAA >60 05/23/2018 0319   Lipase  No results found  for: LIPASE     Studies/Results: No results found.  Anti-infectives: Anti-infectives (From admission, onward)   Start     Dose/Rate Route Frequency Ordered Stop   05/23/18 0700  neomycin (MYCIFRADIN) tablet 1,000 mg     1,000 mg Oral 3 times daily 05/22/18 1602 05/24/18 0959   05/23/18 0700  metroNIDAZOLE (FLAGYL) tablet 1,000 mg     1,000 mg Oral 3 times daily 05/22/18 1602 05/24/18 0959       Assessment/Plan S/p TAVR 0/2774 on plavix, complicated by cardiac arrest Mild acute on chronic HFpEF with EF 60-65% Complete heart block s/p Pacemaker H/o CVA Hypothyroidism HTN HLD  Ascending colon mass Symptomatic anemia - hgb 7.9 today, stable, no recent bloody BM - s/p colonoscopy 5/8, pathology 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 They agree to proceed tomorrow. Will do bowel prep today, NPO after midnight. Plan for partial colectomy +/- colostomy tomorrow.   LOS: 6 days  Wellington Hampshire , [facility] Surgery 05/23/2018, 9:43 AM Pager: [phone] Mon-Thurs 7:00 am-4:30 pm Fri 7:00 am -11:30 AM Sat-Sun 7:00 am-11:30 am

## 2018-05-23 NOTE — Progress Notes (Signed)
Family Medicine Teaching Service Daily Progress Note Intern Pager: [phone]  [name] Medical record number: [id number] Date of birth: [birth year] Age: 85 y.o. Gender: female  Primary Care Provider: Lyman Bishop, DO Consultants: GI, gen surg Code Status: full  Pt Overview and Major Events to Date:    Assessment and Plan: Julie Munrois a 85 y.o.femalepresentingsymptomatic anemia to 5.8, found to have a colon mass concerning for malignancy. PMH is significant forrecent TAVR in 02/2018 for severe aortic stenosis, complete heart block s/p pacemaker placement in 02/2018, HFpEF, moderate mitral valve stenosis, hypothyroidism, hypertension, hyperlipidemia.  Acute anemia 2/2 invasive adenocarcinoma  Hgb:7.5>>7.7>8.2>7.9.  Frond-like/villous,nonobstructing acsending colonic mass with oozing suspicious for malignancy seen on colonoscopy on 5/8.  EGD was unremarkable. Planning for hemicolectomy on 5/13 w/ bowel prep on 5/12. son is HCPOA and would like to be called when speaking with pt. Julie Mcgee, [phone] -GI following, appreciate recommendations.  - Gen surg following: 5/12 bowel prep, 5/13 hemicolectomy  -Continue holding home aspirin and Plavix, SCDs in place -Monitor CBC -Follow-up pathology  - NPO at midnight for surgery tomorrow.  -Son who is [facility] POA should be allowed to participate in patient's discussion with surgery after pathology results have been obtained.  Discussed with charge nurse who will allow visitation rights for a meeting between surgery and patient and son during [place].   Right breast mass - incidental pick up on CT.  2.6cm mass in right breast.  Oncology was curbsided and does not believe it was a metastasis of the adenocarcinoma. IR was consulted and agrees it is in pt's best interest to have it worked up outpatient. Patient family agrees.  - IR consulted for breast biopsy, appreciate recs.      Thrombocytopenia - patient's  platelet count has been stable > 350k, suddenly 98k this morning.  Not on heparin.  Only continuing home meds currently.   - f/u cbc, pt/inr, fibrinogen HIT panel, LDH, haptoglobin  Aorticseverestenosis s/p TAVRFebruary 2020,complicated by cardiac arrest:Stable.  TAVR functioning appropriately. -Follow-up cardiology recommendations, patient is moderate risk for surgery but has been cleared by cardiology -Holding home aspirin and Plavix in setting of above  Chronic HFpEF with EF 60-65%:Stable. Patient appears euvolemic on exam.  -Cardiology on board, appreciate recommendations -Continue home Lasix 20 mg daily -Blood pressure control as below  Complete heart block, s/p pacemaker placement:Chronic, stable. Pacemaker placed in 02/2018. HR in60s-70s  - Monitor telemetry - Continue home metoprolol tartrate 50mg  BID  Previous CVA: Chronic, stable. - Hold home aspirin for above - Continue home statin  Hypothyroidism: Chronic, stable. TSH 3.3 in 02/2018.  - Continue home Synthroid125 mcg  Hypertension: Chronic, stable. Normotensive currently.   -Monitor BP - Continue home Lopressor 50 mg twice daily  Hyperlipidemia: Chronic, stable. Takes simvastatin 80 mg daily. - Continue statin, substitute with atorvastatin formulary  Perirectal skin breakdown:Acute, improving. - Barrier cream as needed - Continue to monitor  FEN/GI:Full liquid diet per GI  Prophylaxis:SCDs  Disposition: pending, likely SNF  Subjective:  Patient not complaining of any abdominal pain.  Has not noticed any bloody stool or dark tarry stool.  Son says he plans on coming in tomorrow when she is to have her surgery.   Objective: Temp:  [98.2 F (36.8 C)-98.6 F (37 C)] 98.3 F (36.8 C) (05/12 0401) Pulse Rate:  [59-73] 64 (05/12 0401) Resp:  [18] 18 (05/12 0401) BP: (86-123)/(29-49) 123/42 (05/12 0401) SpO2:  [96 %-99 %] 96 % (05/12 0401) Weight:  [82.8 kg]  82.8 kg (05/11  2041) Physical Exam: General: alert.  No acute distress.   Cardiovascular: regular rhythm. Normal rate. No murmurs.  Respiratory: LCTAB. No wheezes or crackles.  Abdomen: soft, nontender but patient has mild guarding with palpation, similar to yesterday's exam.  NBS.   Laboratory: Recent Labs  Lab 05/21/18 0657 05/22/18 0654 05/23/18 0319  WBC 8.1 8.3 7.1  HGB 7.7* 8.2* 7.9*  HCT 26.2* 27.6* 26.9*  PLT 389 418* 98*   Recent Labs  Lab 05/17/18 1244  05/19/18 0308 05/20/18 0410 05/22/18 0654 05/23/18 0319  NA 134*   < > 140 136 138 138  K 4.4   < > 3.8 3.8 4.7 4.3  CL 105   < > 106 102 106 102  CO2 20*   < > 23 24 25 24   BUN 20   < > 11 9 7* 6*  CREATININE 0.88   < > 0.85 0.85 0.86 0.81  CALCIUM 9.1   < > 8.8* 8.7* 8.8* 8.7*  PROT 6.2*  --  5.4*  --   --   --   BILITOT 0.4  --  1.1  --   --   --   ALKPHOS 66  --  64  --   --   --   ALT 7  --  9  --   --   --   AST 18  --  16  --   --   --   GLUCOSE 102*   < > 92 100* 95 91   < > = values in this interval not displayed.    Imaging/Diagnostic Tests:   Benay Pike, MD 05/23/2018, 6:48 AM PGY-1, [place] Intern pager: [phone], text pages welcome

## 2018-05-23 NOTE — Progress Notes (Signed)
Physical Therapy Treatment Patient Details Name: Mikahla Wisor MRN: [id number] DOB: [birth year] Today's Date: 05/23/2018    History of Present Illness Breeona Waid is a 85 y.o. female presenting with a 1-2-week history of darker stools and lightheadedness, found to have a hemoglobin of 5.8 on arrival, concerning for an acute GI bleed. Mass found, concerning for malignancy; they are considering surgery; PMH is significant for recent TAVR in 02/2018 for severe aortic stenosis, Code duringthat admission (recovered) complete heart block s/p pacemaker placement in 02/2018, HFpEF, moderate mitral valve stenosis, hypothyroidism, hypertension, hyperlipidemia., CVA with residual r hemiparesis    PT Comments    Continuing work on functional mobility and activity tolerance;   Unable to amb today due to significant BP drop in standing; BPs were positive for orthostasis yesterday, and this is likely exacerbated by bowel prep today; Hopeful that she will do well enough postop for dc home -- however if slow progress postop, must consider CIR for post-acute rehab; Also worth considering Home First program with [facility]; discussed with Ellard Artis, [facility]    05/23/18 1251 05/23/18 1313  Vital Signs  Patient Position (if appropriate) Orthostatic Vitals  --   Orthostatic Lying   BP- Lying 125/53  --   Pulse- Lying 63  --   Orthostatic Sitting  BP- Sitting 101/47 99/52  Pulse- Sitting 66 66  Orthostatic Standing at 0 minutes  BP- Standing at 0 minutes (!) 73/47  --   Pulse- Standing at 0 minutes 72  --   Orthostatic Standing at 3 minutes  BP- Standing at 3 minutes (!) 86/49  --   Pulse- Standing at 3 minutes 72  --    RNs aware of above BPs   Follow Up Recommendations  Home health PT;Supervision/Assistance - 24 hour;Other (comment)(will revisit postop:  Consider CIR versus home with [facility] program )     Equipment Recommendations  None recommended by PT(seems pretty well-equipped)    Recommendations  for Other Services       Precautions / Restrictions Precautions Precautions: Fall Precaution Comments: BP drop with incr time upright standing Restrictions Weight Bearing Restrictions: No    Mobility  Bed Mobility Overal bed mobility: Modified Independent Bed Mobility: Supine to Sit     Supine to sit: Modified independent (Device/Increase time);HOB elevated        Transfers Overall transfer level: Needs assistance Equipment used: Rolling walker (2 wheeled) Transfers: Sit to/from [facility] Sit to Stand: Min assist Stand pivot transfers: Min assist       General transfer comment: min assist to steady; tended to get in a rush to the commode, compromisisng safety  Ambulation/Gait             General Gait Details: BP drop too low with standing to pursue ambulation today   Stairs             Wheelchair Mobility    Modified Rankin (Stroke Patients Only)       Balance Overall balance assessment: Needs assistance           Standing balance-Leahy Scale: Fair(decr standing tolerance)                              Cognition Arousal/Alertness: Awake/alert Behavior During Therapy: WFL for tasks assessed/performed Overall Cognitive Status: Impaired/Different from baseline Area of Impairment: Safety/judgement  Safety/Judgement: Decreased awareness of safety;Decreased awareness of deficits     General Comments: Discussed safety concerns with returning home if BP continues to drop during standing activities. Discussed safety concerns with Husband caring for her and chance of falling and hitting her head or breaking something if she was at home.       Exercises      General Comments        Pertinent Vitals/Pain Pain Assessment: No/denies pain Pain Score: 2  Pain Location: stomach/intentines Pain Descriptors / Indicators: Cramping Pain Intervention(s): Monitored during session     Home Living Family/patient expects to be discharged to:: Private residence Living Arrangements: Spouse/significant other Available Help at Discharge: Family;Available 24 hours/day Type of Home: Independent living facility Home Access: Level entry   Home Layout: One level Home Equipment: Walker - 2 wheels;Walker - 4 wheels;Cane - single point;Bedside commode;Grab bars - tub/shower;Shower seat      Prior Function Level of Independence: Independent with assistive device(s)      Comments: Pt used 2 wheeled RW outdoors and rollator indoors   PT Goals (current goals can now be found in the care plan section) Acute Rehab PT Goals Patient Stated Goal: to get home well PT Goal Formulation: With patient Time For Goal Achievement: 06/04/18 Potential to Achieve Goals: Good Progress towards PT goals: Not progressing toward goals - comment(limited activity tolerance today)    Frequency    Min 3X/week      PT Plan Other (comment)(DC plan will be contingent upon activity tolerance and postop course)    Co-evaluation              AM-PAC PT "6 Clicks" Mobility   Outcome Measure  Help needed turning from your back to your side while in a flat bed without using bedrails?: None Help needed moving from lying on your back to sitting on the side of a flat bed without using bedrails?: A Little Help needed moving to and from a bed to a chair (including a wheelchair)?: A Little Help needed standing up from a chair using your arms (e.g., wheelchair or bedside chair)?: A Little Help needed to walk in hospital room?: A Lot Help needed climbing 3-5 steps with a railing? : Total 6 Click Score: 16    End of Session Equipment Utilized During Treatment: Other (comment)(vitals machine) Activity Tolerance: Other (comment)(limited by orthostatic hypotension) Patient left: with chair alarm set;with nursing/sitter in room;Other (comment)(finishing up on [facility]) Nurse Communication: Mobility  status;Other (comment)(Orthostatic vital signs) PT Visit Diagnosis: Unsteadiness on feet (R26.81);Other abnormalities of gait and mobility (R26.89)     Time: 5366-4403 PT Time Calculation (min) (ACUTE ONLY): 23 min  Charges:  $Therapeutic Activity: 23-37 mins                     Roney Marion, PT  Acute Rehabilitation Services Pager [phone] Office [place] 05/23/2018, 1:55 PM

## 2018-05-23 NOTE — Plan of Care (Signed)
  Problem: Acute Rehab OT Goals (only OT should resolve) Goal: Pt. Will Perform Grooming Flowsheets (Taken 05/23/2018 1144) Pt Will Perform Grooming: with set-up; standing Goal: Pt. Will Perform Upper Body Bathing Flowsheets (Taken 05/23/2018 1144) Pt Will Perform Upper Body Bathing: with set-up; sitting Goal: Pt. Will Perform Lower Body Bathing Flowsheets (Taken 05/23/2018 1144) Pt Will Perform Lower Body Bathing: with set-up; sitting/lateral leans; sit to/from stand Goal: Pt. Will Transfer To Toilet Flowsheets (Taken 05/23/2018 1144) Pt Will Transfer to Toilet: with supervision; stand pivot transfer; ambulating; bedside commode; regular height toilet Goal: Pt. Will Perform Toileting-Clothing Manipulation Flowsheets (Taken 05/23/2018 1144) Pt Will Perform Toileting - Clothing Manipulation and hygiene: with modified independence; sitting/lateral leans; sit to/from stand Goal: Pt/Caregiver Will Perform Home Exercise Program Flowsheets (Taken 05/23/2018 1144) Pt/caregiver will Perform Home Exercise Program: Both right and left upper extremity; With written HEP provided

## 2018-05-24 ENCOUNTER — Inpatient Hospital Stay (HOSPITAL_COMMUNITY): Payer: Medicare Other | Admitting: Certified Registered"

## 2018-05-24 ENCOUNTER — Encounter (HOSPITAL_COMMUNITY): Payer: Self-pay | Admitting: Certified Registered"

## 2018-05-24 ENCOUNTER — Encounter (HOSPITAL_COMMUNITY)
Admission: EM | Disposition: A | Discharge status: Home Health | Payer: Self-pay | Source: Home / Self Care | Attending: Family Medicine

## 2018-05-24 HISTORY — PX: LAPAROSCOPIC PARTIAL COLECTOMY: SHX5907

## 2018-05-24 LAB — HEPARIN INDUCED PLATELET AB (HIT ANTIBODY): Heparin Induced Plt Ab: 0.066 OD (ref 0.000–0.400)

## 2018-05-24 LAB — MRSA PCR SCREENING: MRSA by PCR: NEGATIVE

## 2018-05-24 LAB — HAPTOGLOBIN: Haptoglobin: 207 mg/dL (ref 41–333)

## 2018-05-24 SURGERY — LAPAROSCOPIC PARTIAL COLECTOMY
Anesthesia: General | Site: Abdomen

## 2018-05-24 MED ORDER — SODIUM CHLORIDE 0.9 % IV SOLN
INTRAVENOUS | Status: DC | PRN
  Administered 2018-05-24: 50 ug/min via INTRAVENOUS

## 2018-05-24 MED ORDER — DEXAMETHASONE SODIUM PHOSPHATE 10 MG/ML IJ SOLN
INTRAMUSCULAR | Status: DC | PRN
  Administered 2018-05-24: 5 mg via INTRAVENOUS

## 2018-05-24 MED ORDER — KETAMINE HCL 10 MG/ML IJ SOLN
INTRAMUSCULAR | Status: DC | PRN
  Administered 2018-05-24: 25 mg via INTRAVENOUS

## 2018-05-24 MED ORDER — 0.9 % SODIUM CHLORIDE (POUR BTL) OPTIME
TOPICAL | Status: DC | PRN
  Administered 2018-05-24 (×2): 1000 mL

## 2018-05-24 MED ORDER — LIDOCAINE IN D5W 4-5 MG/ML-% IV SOLN
INTRAVENOUS | Status: AC
  Filled 2018-05-24: qty 500

## 2018-05-24 MED ORDER — FENTANYL CITRATE (PF) 100 MCG/2ML IJ SOLN
INTRAMUSCULAR | Status: DC | PRN
  Administered 2018-05-24: 100 ug via INTRAVENOUS

## 2018-05-24 MED ORDER — DEXAMETHASONE SODIUM PHOSPHATE 10 MG/ML IJ SOLN
INTRAMUSCULAR | Status: AC
  Filled 2018-05-24: qty 1

## 2018-05-24 MED ORDER — METOPROLOL TARTRATE 5 MG/5ML IV SOLN: 5.0000 mg | Freq: Four times a day (QID) | INTRAVENOUS | Status: DC | PRN

## 2018-05-24 MED ORDER — FENTANYL CITRATE (PF) 100 MCG/2ML IJ SOLN: 12.5000 ug | INTRAMUSCULAR | Status: DC | PRN

## 2018-05-24 MED ORDER — BOOST / RESOURCE BREEZE PO LIQD CUSTOM
1.0000 | Freq: Two times a day (BID) | ORAL | Status: DC
  Administered 2018-05-24: 17:00:00 via ORAL
  Administered 2018-05-25 – 2018-05-28 (×7): 1 via ORAL
  Filled 2018-05-24 (×9): qty 1

## 2018-05-24 MED ORDER — MIDAZOLAM HCL 5 MG/5ML IJ SOLN
INTRAMUSCULAR | Status: DC | PRN
  Administered 2018-05-24: 1 mg via INTRAVENOUS

## 2018-05-24 MED ORDER — SODIUM CHLORIDE 0.9 % IR SOLN
Status: DC | PRN
  Administered 2018-05-24: 1000 mL

## 2018-05-24 MED ORDER — ONDANSETRON HCL 4 MG/2ML IJ SOLN: 4.0000 mg | Freq: Four times a day (QID) | INTRAMUSCULAR | Status: DC | PRN

## 2018-05-24 MED ORDER — FENTANYL CITRATE (PF) 250 MCG/5ML IJ SOLN
INTRAMUSCULAR | Status: AC
  Filled 2018-05-24: qty 5

## 2018-05-24 MED ORDER — BUPIVACAINE-EPINEPHRINE (PF) 0.25% -1:200000 IJ SOLN
INTRAMUSCULAR | Status: AC
  Filled 2018-05-24: qty 30

## 2018-05-24 MED ORDER — SACCHAROMYCES BOULARDII 250 MG PO CAPS
250.0000 mg | ORAL_CAPSULE | Freq: Two times a day (BID) | ORAL | Status: DC
  Administered 2018-05-24 – 2018-05-28 (×9): 250 mg via ORAL
  Filled 2018-05-24 (×9): qty 1

## 2018-05-24 MED ORDER — DIPHENHYDRAMINE HCL 50 MG/ML IJ SOLN: 12.5000 mg | Freq: Four times a day (QID) | INTRAMUSCULAR | Status: DC | PRN

## 2018-05-24 MED ORDER — ENSURE SURGERY PO LIQD
237.0000 mL | Freq: Two times a day (BID) | ORAL | Status: DC
  Administered 2018-05-24 – 2018-05-28 (×8): 237 mL via ORAL
  Filled 2018-05-24 (×9): qty 237

## 2018-05-24 MED ORDER — LIDOCAINE 2% (20 MG/ML) 5 ML SYRINGE
INTRAMUSCULAR | Status: AC
  Filled 2018-05-24: qty 5

## 2018-05-24 MED ORDER — PROPOFOL 10 MG/ML IV BOLUS
INTRAVENOUS | Status: DC | PRN
  Administered 2018-05-24: 120 mg via INTRAVENOUS

## 2018-05-24 MED ORDER — KETAMINE HCL 50 MG/5ML IJ SOSY
PREFILLED_SYRINGE | INTRAMUSCULAR | Status: AC
  Filled 2018-05-24: qty 5

## 2018-05-24 MED ORDER — PROPOFOL 10 MG/ML IV BOLUS
INTRAVENOUS | Status: AC
  Filled 2018-05-24: qty 20

## 2018-05-24 MED ORDER — BUPIVACAINE-EPINEPHRINE (PF) 0.25% -1:200000 IJ SOLN
INTRAMUSCULAR | Status: DC | PRN
  Administered 2018-05-24: 25 mL
  Administered 2018-05-24: 5 mL

## 2018-05-24 MED ORDER — ONDANSETRON HCL 4 MG/2ML IJ SOLN
INTRAMUSCULAR | Status: AC
  Filled 2018-05-24: qty 2

## 2018-05-24 MED ORDER — ONDANSETRON HCL 4 MG PO TABS: 4.0000 mg | ORAL_TABLET | Freq: Four times a day (QID) | ORAL | Status: DC | PRN

## 2018-05-24 MED ORDER — MIDAZOLAM HCL 2 MG/2ML IJ SOLN
INTRAMUSCULAR | Status: AC
  Filled 2018-05-24: qty 2

## 2018-05-24 MED ORDER — OXYCODONE HCL 5 MG PO TABS
5.0000 mg | ORAL_TABLET | ORAL | Status: DC | PRN
  Administered 2018-05-24 – 2018-05-27 (×5): 5 mg via ORAL
  Filled 2018-05-24 (×5): qty 1

## 2018-05-24 MED ORDER — ONDANSETRON HCL 4 MG/2ML IJ SOLN
INTRAMUSCULAR | Status: DC | PRN
  Administered 2018-05-24: 4 mg via INTRAVENOUS

## 2018-05-24 MED ORDER — STERILE WATER FOR IRRIGATION IR SOLN
Status: DC | PRN
  Administered 2018-05-24: 1000 mL

## 2018-05-24 MED ORDER — ALVIMOPAN 12 MG PO CAPS
12.0000 mg | ORAL_CAPSULE | Freq: Two times a day (BID) | ORAL | Status: DC
  Administered 2018-05-25 (×2): 12 mg via ORAL
  Filled 2018-05-24 (×3): qty 1

## 2018-05-24 MED ORDER — ADULT MULTIVITAMIN W/MINERALS CH
1.0000 | ORAL_TABLET | Freq: Every day | ORAL | Status: DC
  Administered 2018-05-24 – 2018-05-28 (×5): 1 via ORAL
  Filled 2018-05-24 (×5): qty 1

## 2018-05-24 MED ORDER — LIDOCAINE IN D5W 4-5 MG/ML-% IV SOLN
INTRAVENOUS | Status: DC | PRN
  Administered 2018-05-24: 25 ug/kg/min via INTRAVENOUS

## 2018-05-24 MED ORDER — ROCURONIUM BROMIDE 10 MG/ML (PF) SYRINGE
PREFILLED_SYRINGE | INTRAVENOUS | Status: DC | PRN
  Administered 2018-05-24: 50 mg via INTRAVENOUS

## 2018-05-24 MED ORDER — BUPIVACAINE LIPOSOME 1.3 % IJ SUSP
20.0000 mL | INTRAMUSCULAR | Status: AC
  Administered 2018-05-24: 20 mL
  Filled 2018-05-24: qty 20

## 2018-05-24 MED ORDER — DIPHENHYDRAMINE HCL 12.5 MG/5ML PO ELIX: 12.5000 mg | ORAL_SOLUTION | Freq: Four times a day (QID) | ORAL | Status: DC | PRN

## 2018-05-24 MED ORDER — LACTATED RINGERS IV SOLN
INTRAVENOUS | Status: DC | PRN
  Administered 2018-05-24: 08:00:00 via INTRAVENOUS

## 2018-05-24 MED ORDER — LABETALOL HCL 5 MG/ML IV SOLN
INTRAVENOUS | Status: DC | PRN
  Administered 2018-05-24 (×3): 5 mg via INTRAVENOUS

## 2018-05-24 MED ORDER — ALUM & MAG HYDROXIDE-SIMETH 200-200-20 MG/5ML PO SUSP: 30.0000 mL | Freq: Four times a day (QID) | ORAL | Status: DC | PRN

## 2018-05-24 MED ORDER — SUGAMMADEX SODIUM 200 MG/2ML IV SOLN
INTRAVENOUS | Status: DC | PRN
  Administered 2018-05-24: 200 mg via INTRAVENOUS

## 2018-05-24 MED ORDER — ROCURONIUM BROMIDE 10 MG/ML (PF) SYRINGE
PREFILLED_SYRINGE | INTRAVENOUS | Status: AC
  Filled 2018-05-24: qty 10

## 2018-05-24 SURGICAL SUPPLY — 65 items
CANISTER SUCT 3000ML PPV (MISCELLANEOUS) ×2 IMPLANT
CHLORAPREP W/TINT 26 (MISCELLANEOUS) ×2 IMPLANT
COVER MAYO STAND STRL (DRAPES) ×2 IMPLANT
COVER SURGICAL LIGHT HANDLE (MISCELLANEOUS) ×4 IMPLANT
DERMABOND ADVANCED (GAUZE/BANDAGES/DRESSINGS) ×1
DERMABOND ADVANCED .7 DNX12 (GAUZE/BANDAGES/DRESSINGS) ×1 IMPLANT
DRAPE PROXIMA HALF (DRAPES) ×4 IMPLANT
DRAPE UTILITY XL STRL (DRAPES) ×2 IMPLANT
DRAPE WARM FLUID 44X44 (DRAPE) ×2 IMPLANT
ELECT BLADE 6.5 EXT (BLADE) ×2 IMPLANT
ELECT CAUTERY BLADE 6.4 (BLADE) ×2 IMPLANT
ELECT REM PT RETURN 9FT ADLT (ELECTROSURGICAL) ×2
ELECTRODE REM PT RTRN 9FT ADLT (ELECTROSURGICAL) ×1 IMPLANT
GLOVE BIO SURGEON STRL SZ 6.5 (GLOVE) ×4 IMPLANT
GLOVE BIO SURGEON STRL SZ7 (GLOVE) ×4 IMPLANT
GLOVE BIO SURGEON STRL SZ7.5 (GLOVE) ×6 IMPLANT
GLOVE BIOGEL PI IND STRL 6.5 (GLOVE) ×2 IMPLANT
GLOVE BIOGEL PI IND STRL 7.0 (GLOVE) ×5 IMPLANT
GLOVE BIOGEL PI IND STRL 7.5 (GLOVE) ×2 IMPLANT
GLOVE BIOGEL PI INDICATOR 6.5 (GLOVE) ×2
GLOVE BIOGEL PI INDICATOR 7.0 (GLOVE) ×5
GLOVE BIOGEL PI INDICATOR 7.5 (GLOVE) ×2
GLOVE ECLIPSE 7.0 STRL STRAW (GLOVE) ×4 IMPLANT
GLOVE INDICATOR 8.0 STRL GRN (GLOVE) ×4 IMPLANT
GLOVE SURG SS PI 6.0 STRL IVOR (GLOVE) ×2 IMPLANT
GOWN STRL REUS W/ TWL LRG LVL3 (GOWN DISPOSABLE) ×7 IMPLANT
GOWN STRL REUS W/ TWL XL LVL3 (GOWN DISPOSABLE) ×2 IMPLANT
GOWN STRL REUS W/TWL LRG LVL3 (GOWN DISPOSABLE) ×7
GOWN STRL REUS W/TWL XL LVL3 (GOWN DISPOSABLE) ×2
IV SOD CHL 0.9% 1000ML (IV SOLUTION) ×2 IMPLANT
KIT BASIN OR (CUSTOM PROCEDURE TRAY) ×4 IMPLANT
NS IRRIG 1000ML POUR BTL (IV SOLUTION) ×4 IMPLANT
PAD ARMBOARD 7.5X6 YLW CONV (MISCELLANEOUS) ×4 IMPLANT
PENCIL BUTTON HOLSTER BLD 10FT (ELECTRODE) ×2 IMPLANT
PENCIL SMOKE EVACUATOR (MISCELLANEOUS) ×2 IMPLANT
PORT LAP GEL ALEXIS MED 5-9CM (MISCELLANEOUS) ×2 IMPLANT
RELOAD PROXIMATE 75MM BLUE (ENDOMECHANICALS) ×6 IMPLANT
SCISSORS LAP 5X35 DISP (ENDOMECHANICALS) ×2 IMPLANT
SEALER TISSUE G2 STRG ARTC 35C (ENDOMECHANICALS) ×2 IMPLANT
SET IRRIG TUBING LAPAROSCOPIC (IRRIGATION / IRRIGATOR) ×2 IMPLANT
SET TUBE SMOKE EVAC HIGH FLOW (TUBING) ×2 IMPLANT
SLEEVE ENDOPATH XCEL 5M (ENDOMECHANICALS) ×4 IMPLANT
SLEEVE SUCTION CATH 165 (SLEEVE) ×2 IMPLANT
SPECIMEN JAR LARGE (MISCELLANEOUS) ×2 IMPLANT
STAPLER GUN LINEAR PROX 60 (STAPLE) ×2 IMPLANT
STAPLER PROXIMATE 75MM BLUE (STAPLE) ×2 IMPLANT
STAPLER VISISTAT 35W (STAPLE) ×2 IMPLANT
SUCTION POOLE TIP (SUCTIONS) ×2 IMPLANT
SUT MNCRL AB 4-0 PS2 18 (SUTURE) ×4 IMPLANT
SUT PDS AB 1 CT  36 (SUTURE) ×2
SUT PDS AB 1 CT 36 (SUTURE) ×2 IMPLANT
SUT SILK 2 0 SH CR/8 (SUTURE) ×2 IMPLANT
SUT SILK 2 0 TIES 10X30 (SUTURE) ×2 IMPLANT
SUT SILK 3 0 SH CR/8 (SUTURE) ×2 IMPLANT
SUT SILK 3 0 TIES 10X30 (SUTURE) ×2 IMPLANT
SYR BULB IRRIGATION 50ML (SYRINGE) ×2 IMPLANT
SYS LAPSCP GELPORT 120MM (MISCELLANEOUS) ×2
SYSTEM LAPSCP GELPORT 120MM (MISCELLANEOUS) ×1 IMPLANT
TOWEL GREEN STERILE (TOWEL DISPOSABLE) ×2 IMPLANT
TRAY LAPAROSCOPIC MC (CUSTOM PROCEDURE TRAY) ×2 IMPLANT
TROCAR XCEL 12X100 BLDLESS (ENDOMECHANICALS) ×2 IMPLANT
TROCAR XCEL NON-BLD 5MMX100MML (ENDOMECHANICALS) ×2 IMPLANT
TUBE CONNECTING 12X1/4 (SUCTIONS) ×4 IMPLANT
WATER STERILE IRR 1000ML POUR (IV SOLUTION) ×2 IMPLANT
YANKAUER SUCT BULB TIP NO VENT (SUCTIONS) ×4 IMPLANT

## 2018-05-24 NOTE — Anesthesia Procedure Notes (Signed)
Procedure Name: Intubation Date/Time: 05/24/2018 8:49 AM Performed by: Moshe Salisbury, CRNA Pre-anesthesia Checklist: Patient identified, Emergency Drugs available, Suction available and Patient being monitored Patient Re-evaluated:Patient Re-evaluated prior to induction Oxygen Delivery Method: Circle System Utilized Preoxygenation: Pre-oxygenation with 100% oxygen Induction Type: IV induction Ventilation: Mask ventilation without difficulty Laryngoscope Size: Mac and 3 Grade View: Grade II Tube type: Oral Tube size: 7.5 mm Number of attempts: 1 Airway Equipment and Method: Stylet Placement Confirmation: ETT inserted through vocal cords under direct vision,  positive ETCO2 and breath sounds checked- equal and bilateral Secured at: 21 cm Tube secured with: Tape Dental Injury: Teeth and Oropharynx as per pre-operative assessment

## 2018-05-24 NOTE — Anesthesia Preprocedure Evaluation (Signed)
Anesthesia Evaluation  Patient identified by MRN, date of birth, ID band Patient awake    Reviewed: Allergy & Precautions, NPO status , Patient's Chart, lab work & pertinent test results  Airway Mallampati: II  TM Distance: >3 FB Neck ROM: Full    Dental  (+) Dental Advisory Given, Edentulous Upper   Pulmonary former smoker,    breath sounds clear to auscultation       Cardiovascular hypertension,  Rhythm:Regular Rate:Normal     Neuro/Psych    GI/Hepatic   Endo/Other    Renal/GU      Musculoskeletal   Abdominal   Peds  Hematology   Anesthesia Other Findings   Reproductive/Obstetrics                             Anesthesia Physical Anesthesia Plan  ASA: III  Anesthesia Plan: General   Post-op Pain Management:    Induction: Intravenous  PONV Risk Score and Plan: Ondansetron and Dexamethasone  Airway Management Planned: Oral ETT  Additional Equipment:   Intra-op Plan:   Post-operative Plan: Extubation in OR  Informed Consent: I have reviewed the patients History and Physical, chart, labs and discussed the procedure including the risks, benefits and alternatives for the proposed anesthesia with the patient or authorized representative who has indicated his/her understanding and acceptance.     Dental advisory given  Plan Discussed with: CRNA and Anesthesiologist  Anesthesia Plan Comments:         Anesthesia Quick Evaluation

## 2018-05-24 NOTE — Progress Notes (Signed)
Family Medicine Teaching Service Daily Progress Note Intern Pager: [phone]  [name] Medical record number: [id number] Date of birth: March 16, 1933 Age: 85 y.o. Gender: female  Primary Care Provider: Lyman Bishop, DO Consultants: GI, gen surg Code Status: full  Pt Overview and Major Events to Date:    Assessment and Plan: Julie Mcgee a 85 y.o.femalepresentingsymptomatic anemia to 5.8, found to have a colon mass concerning for malignancy. PMH is significant forrecent TAVR in 02/2018 for severe aortic stenosis, complete heart block s/p pacemaker placement in 02/2018, HFpEF, moderate mitral valve stenosis, hypothyroidism, hypertension, hyperlipidemia.  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  Frond-like/villous,nonobstructing acsending colonic mass with oozing suspicious for malignancy seen on colonoscopy on 5/8.  EGD was unremarkable.  hemicolectomy on 5/13  son is HCPOA and would like to be called when speaking with pt. Julie Mcgee, [phone] -GI following, appreciate recommendations.  - Gen surg following: 5/12 bowel prep, 5/13 hemicolectomy  -Continue holding home aspirin and Plavix, SCDs in place -Monitor CBC -Son who is Julie Mcgee POA should be allowed to participate in patient's discussion with surgery after pathology results have been obtained.  Discussed with charge nurse who Julie Mcgee allow visitation rights for a meeting between surgery and patient and son during [place].   Right breast mass - incidental pick up on CT.  2.6cm mass in right breast.  Julie Mcgee work up outpatient.   - IR consulted for breast biopsy, appreciate recs.      Aorticseverestenosis s/p TAVRFebruary 2020,complicated by cardiac arrest:Stable.  TAVR functioning appropriately. -Follow-up cardiology recommendations, patient is moderate risk for surgery but has been cleared by cardiology -Holding home aspirin and Plavix in setting of  above  Chronic HFpEF with EF 60-65%:Stable. Patient appears euvolemic on exam.  -Cardiology on board, appreciate recommendations -Blood pressure control as below  Complete heart block, s/p pacemaker placement:Chronic, stable. Pacemaker placed in 02/2018. HR in60s-70s  - Monitor telemetry - Continue home metoprolol tartrate 50mg  BID  Previous CVA: Chronic, stable. - Hold home aspirin for above - Continue home statin  Hypothyroidism: Chronic, stable. TSH 3.3 in 02/2018.  - Continue home Synthroid125 mcg  Hypertension: Chronic, stable. Normotensive currently.  Having orthostatic hypotension when working with PT.  -Monitor BP - Continue home Lopressor 50 mg twice daily  Hyperlipidemia: Chronic, stable. Takes simvastatin 80 mg daily. - Continue statin, substitute with atorvastatin formulary  Perirectal skin breakdown:Acute, improving. - Barrier cream as needed - Continue to monitor  FEN/GI:Full liquid diet per GI  Prophylaxis:SCDs  Disposition: pending, likely SNF  Subjective:  Spoke with patient and patient's son after her surgery.  Patient was still somnolent.  Her son, Julie Mcgee, had questions about when she would be able to leave and what she would need to be able to do before she left.    Objective: Temp:  [98.2 F (36.8 C)-98.6 F (37 C)] 98.3 F (36.8 C) (05/13 0454) Pulse Rate:  [59-70] 63 (05/13 0454) Resp:  [16-20] 16 (05/13 0454) BP: (75-142)/(42-69) 130/58 (05/13 0454) SpO2:  [95 %-100 %] 100 % (05/13 0454) Weight:  [82.8 kg] 82.8 kg (05/12 2116) Physical Exam: General: alert.  Resting CV: regular rrhythm. Normal rate.  Pulm: lctab anteriorly.   Laboratory: Recent Labs  Lab 05/22/18 0654 05/23/18 0319 05/23/18 1326  WBC 8.3 7.1 11.6*  HGB 8.2* 7.9* 8.8*  HCT 27.6* 26.9* 30.1*  PLT 418* 98* 429*   Recent Labs  Lab 05/17/18 1244  05/19/18 0308 05/20/18 0410 05/22/18 0654 05/23/18 0319  NA 134*   < >  140 136 138 138  K 4.4   < >  3.8 3.8 4.7 4.3  CL 105   < > 106 102 106 102  CO2 20*   < > 23 24 25 24   BUN 20   < > 11 9 7* 6*  CREATININE 0.88   < > 0.85 0.85 0.86 0.81  CALCIUM 9.1   < > 8.8* 8.7* 8.8* 8.7*  PROT 6.2*  --  5.4*  --   --   --   BILITOT 0.4  --  1.1  --   --   --   ALKPHOS 66  --  64  --   --   --   ALT 7  --  9  --   --   --   AST 18  --  16  --   --   --   GLUCOSE 102*   < > 92 100* 95 91   < > = values in this interval not displayed.    Imaging/Diagnostic Tests:   Benay Pike, MD 05/24/2018, 7:02 AM PGY-1, [place] Intern pager: [phone], text pages welcome

## 2018-05-24 NOTE — Transfer of Care (Signed)
Immediate Anesthesia Transfer of Care Note  Patient: Julie Mcgee  Procedure(s) Performed: LAPAROSCOPIC ASSISTED ASCENDING HEMICOLECTOMY (N/A Abdomen)  Patient Location: PACU  Anesthesia Type:General  Level of Consciousness: drowsy and patient cooperative  Airway & Oxygen Therapy: Patient Spontanous Breathing and Patient connected to nasal cannula oxygen  Post-op Assessment: Report given to RN, Post -op Vital signs reviewed and stable and Patient moving all extremities  Post vital signs: Reviewed and stable  Last Vitals:  Vitals Value Taken Time  BP 124/55 05/24/2018 11:19 AM  Temp    Pulse 60 05/24/2018 11:20 AM  Resp 16 05/24/2018 11:20 AM  SpO2 100 % 05/24/2018 11:20 AM  Vitals shown include unvalidated device data.  Last Pain:  Vitals:   05/24/18 0454  TempSrc: Oral  PainSc:          Complications: No apparent anesthesia complications

## 2018-05-24 NOTE — Anesthesia Postprocedure Evaluation (Signed)
Anesthesia Post Note  Patient: Julie Mcgee  Procedure(s) Performed: LAPAROSCOPIC ASSISTED ASCENDING HEMICOLECTOMY (N/A Abdomen)     Patient location during evaluation: PACU Anesthesia Type: General Level of consciousness: awake and alert Pain management: pain level controlled Vital Signs Assessment: post-procedure vital signs reviewed and stable Respiratory status: spontaneous breathing, nonlabored ventilation, respiratory function stable and patient connected to nasal cannula oxygen Cardiovascular status: blood pressure returned to baseline and stable Postop Assessment: no apparent nausea or vomiting Anesthetic complications: no    Last Vitals:  Vitals:   05/24/18 1210 05/24/18 1640  BP: (!) 125/51 (!) 115/45  Pulse: 60 63  Resp:  18  Temp: 36.6 C 36.5 C  SpO2: 99% 99%    Last Pain:  Vitals:   05/24/18 1640  TempSrc: Oral  PainSc:                  [name]

## 2018-05-24 NOTE — Progress Notes (Signed)
[facility] Progress Note  Day of Surgery  Subjective: CC-  Feeling well today. No complaints. Denies abdominal pain, nausea, vomiting. Took prep yesterday - 64oz of gatorade and miralax. Clear liquid stool at completion.  Objective: Vital signs in last 24 hours: Temp:  [98.2 F (36.8 C)-98.6 F (37 C)] 98.3 F (36.8 C) (05/13 0454) Pulse Rate:  [59-70] 63 (05/13 0454) Resp:  [16-20] 16 (05/13 0454) BP: (75-142)/(42-69) 130/58 (05/13 0454) SpO2:  [95 %-100 %] 100 % (05/13 0454) Weight:  [82.8 kg] 82.8 kg (05/12 2116) Last BM Date: 05/23/18  Intake/Output from previous day: 05/12 0701 - 05/13 0700 In: 600 [P.O.:600] Out: 1351 [Urine:1350; Stool:1] Intake/Output this shift: No intake/output data recorded.  PE: Gen:  Alert, NAD, pleasant HEENT: EOM's intact, pupils equal and round Card:  RRR, +murmur Pulm:  CTAB, no W/R/R, effort normal Abd: Soft, NT/ND, +BS, no HSM Ext:  Calves soft and nontender Psych: A&Ox3  Skin: no rashes noted, warm and dry   Lab Results:  Recent Labs    05/23/18 0319 05/23/18 1326  WBC 7.1 11.6*  HGB 7.9* 8.8*  HCT 26.9* 30.1*  PLT 98* 429*   BMET Recent Labs    05/22/18 0654 05/23/18 0319  NA 138 138  K 4.7 4.3  CL 106 102  CO2 25 24  GLUCOSE 95 91  BUN 7* 6*  CREATININE 0.86 0.81  CALCIUM 8.8* 8.7*   PT/INR Recent Labs    05/23/18 1326  LABPROT 15.3*  INR 1.2   CMP     Component Value Date/Time   NA 138 05/23/2018 0319   NA 138 03/16/2018 1326   K 4.3 05/23/2018 0319   CL 102 05/23/2018 0319   CO2 24 05/23/2018 0319   GLUCOSE 91 05/23/2018 0319   BUN 6 (L) 05/23/2018 0319   BUN 15 03/16/2018 1326   CREATININE 0.81 05/23/2018 0319   CALCIUM 8.7 (L) 05/23/2018 0319   PROT 5.4 (L) 05/19/2018 0308   ALBUMIN 2.9 (L) 05/19/2018 0308   AST 16 05/19/2018 0308   ALT 9 05/19/2018 0308   ALKPHOS 64 05/19/2018 0308   BILITOT 1.1 05/19/2018 0308   GFRNONAA >60 05/23/2018 0319   GFRAA >60 05/23/2018 0319    Lipase  No results found for: LIPASE     Studies/Results: No results found.  Anti-infectives: Anti-infectives (From admission, onward)   Start     Dose/Rate Route Frequency Ordered Stop   05/24/18 0600  cefoTEtan (CEFOTAN) 2 g in sodium chloride 0.9 % 100 mL IVPB     2 g 200 mL/hr over 30 Minutes Intravenous On call to O.R. 05/23/18 1551 05/25/18 0559   05/23/18 0700  neomycin (MYCIFRADIN) tablet 1,000 mg     1,000 mg Oral 3 times daily 05/22/18 1602 05/23/18 2149   05/23/18 0700  metroNIDAZOLE (FLAGYL) tablet 1,000 mg     1,000 mg Oral 3 times daily 05/22/18 1602 05/23/18 2144       Assessment/Plan S/p TAVR 07/6718 on plavix, complicated by cardiac arrest Mild acute on chronic HFpEF with EF 60-65% Complete heart block s/p Pacemaker H/o CVA Hypothyroidism HTN HLD  Ascending colon mass Symptomatic anemia - hgb 7.9 today, stable, no recent bloody BM - s/p colonoscopy 5/8, pathology INVASIVE ADENOCARCINOMA from colon biopsy - CEA 1.6 - moderate risk for surgery per cardiology  ID - none FEN - IVF, CLD, NPO after MN VTE - SCDs Foley - none Follow up - TBD   Plan:  OR today for  laparoscopic vs open right hemicolectomy -The anatomy and physiology of the GI tract was discussed at length with the patient. The pathophysiology of colon masses and cancer was discussed at length as well. Her son [name] was on speaker phone for all of this as well for this discussion yesterday -The planned procedure, material risks (including, but not limited to, pain, bleeding, infection, scarring, need for blood transfusion, damage to surrounding structures- blood vessels/nerves/viscus/organs, damage to ureter, urine leak, leak from anastomosis, need for additional procedures, need for stoma which may be permanent, worsening of pre-existing medical conditions, DVT/PE, hernia, recurrence, pneumonia, heart attack, stroke, death) benefits and alternatives to surgery were discussed at length. The  patient's and her son's questions were answered to their satisfaction, they voiced understanding and elected to proceed with surgery. Additionally, we discussed typical postoperative expectations and the recovery process.   LOS: 7 days   Sharon Mt. Dema Severin, M.D. [facility]

## 2018-05-24 NOTE — Progress Notes (Signed)
Initial Nutrition Assessment  DOCUMENTATION CODES:   Not applicable  INTERVENTION:    Boost Breeze po BID, each supplement provides 250 kcal and 9 grams of protein  MVI daily  Continue Ensure Surgery po BID, each supplement provides 330 kcal and 18 grams of protein once diet advanced.   NUTRITION DIAGNOSIS:   Increased nutrient needs related to post-op healing as evidenced by estimated needs.  GOAL:   Patient will meet greater than or equal to 90% of their needs  MONITOR:   Diet advancement, Weight trends, Labs, Skin, I & O's, Supplement acceptance, PO intake  REASON FOR ASSESSMENT:   NPO/Clear Liquid Diet    ASSESSMENT:   Patient with PMH significant for aortic stenosis s/p TAVR, complete heart block s/p pacemaker, CHF, mitral valve stenosis, HTN, and HLD. Presents this admission with GIB.    5/8- colonoscopy/EGD- revealed gastric polyp and colonic mass  5/11- pathology results show evidence of adenocarcinoma 5/12- laparoscopic right hemicolectomy   RD working remotely.  Attempted to speak with pt via phone, no answer. Pt currently on clear liquid diet and consumed 85% of her lunch. Prior to surgery pt looked to have a good appetite (meal completions charted as 50-100%). RD to provide supplementation to promote post-op healing.   Per chart records, pt weighed 99.3 kg on 2/24 and 82.8 kg this admission. Suspect some of this is fluid fluctuation given history of CHF. Will need to obtain further history to confirm.   I/O: -1,997 ml since admit UOP: 1,350 ml x 24 hrs   Medications: protonix Labs: CBG 91-95  Diet Order:   Diet Order            Diet clear liquid Room service appropriate? Yes; Fluid consistency: Thin  Diet effective now              EDUCATION NEEDS:   Not appropriate for education at this time  Skin:  Skin Assessment: Skin Integrity Issues: Skin Integrity Issues:: Other (Comment), Incisions Incisions: closed abdomen Other: MASD  buttocks  Last BM:  5/12  Height:   Ht Readings from Last 1 Encounters:  05/19/18 5\' 1"  (1.549 m)    Weight:   Wt Readings from Last 1 Encounters:  05/23/18 82.8 kg    Ideal Body Weight:  47.7 kg  BMI:  Body mass index is 34.5 kg/m.  Estimated Nutritional Needs:   Kcal:  1600-1800 kcal  Protein:  80-100 grams  Fluid:  >/= 1.6   Mariana Single RD, LDN Clinical Nutrition Pager # [phone]

## 2018-05-24 NOTE — Op Note (Signed)
PATIENT: Julie Mcgee  85 y.o. female  Patient Care Team: Masneri, Adele Barthel, DO as PCP - General (Family Medicine) Revankar, Reita Cliche, MD as PCP - Cardiology (Cardiology) Sherren Mocha, MD as PCP - Structural Heart (Cardiology) Evans Lance, MD as PCP - Electrophysiology (Cardiology) Rosalin Hawking, MD as Consulting Physician (Neurology)  PREOP DIAGNOSIS: Ascending colon cancer  POSTOP DIAGNOSIS: Same  PROCEDURE: Laparoscopic right hemicolectomy  SURGEON: Sharon Mt. Shavonte Zhao, MD  ASSISTANT: Margie Billet, PA-C  ANESTHESIA: General endotracheal  EBL: 20 mL Total I/O In: 750 [I.V.:750] Out: 145 [Urine:125; Blood:20]  DRAINS: None  SPECIMEN: 1. Terminal ileum + right colon en bloc 2. Additional ileum  COUNTS: Sponge, needle and instrument counts were reported correct x2  FINDINGS: Tattoo in ascending colon and associated mass. Some adhesions in RLQ between cecum and abdominal wall taken down laparoscopically. Laparoscopic right hemicolectomy with stapled ileo-transverse anastomosis. Specimen including right branch of middle colic vessels. No obvious metastatic disease on visceral parietal peritoneum or liver.  STATEMENT OF MEDICAL NECESSITY: [name] is a 85 y.o. female with hx of HTN, HLD, diastolic CHF, CAD, PVD, hypothyroidism, s/p TAVR on dual antiplatelet therapy presented to the hospital last week with significant anemia and blood in her stool.  She underwent evaluation and was found to have on colonoscopy a mass in her ascending colon as likely source.  This was biopsied and tattooed.  This returned as adenocarcinoma.  CT scans of the chest abdomen pelvis demonstrated no evidence of metastatic colon cancer but there was evidence of a mass in her breast.  Work of this is deferred to an outpatient.  Given the fact that she cannot take her antiplatelet agents without bleeding and having a mass in her colon as likely source, options were discussed with her and her family  moving forward.  She was evaluated by cardiology and determined to be a moderate risk for surgery but given her anemia and bleeding from this mass, recommended to proceed.  I discussed all this with the patient and her son, [name].  Please refer to notes elsewhere for details regarding his discussion.   NARRATIVE:  The patient was identified & brought into the operating room, placed supine on the operating table and SCDs were applied to the lower extremities. General endotracheal anesthesia was induced. The patient was positioned supine with arms tucked. Antibiotics were administered. A foley catheter was placed under sterile conditions. Hair in the region of planned surgery was clipped. The abdomen was prepped and draped in a sterile fashion. A timeout was performed confirming our patient and plan.   Beginning with the extraction port, a supraumbilical incision was made and carried down to the midline fascia. This was then incised with electrocautery. The peritoneum was identified and elevated between clamps and carefully opened sharply. A small Alexis wound protector with a cap and associated port was then placed. The abdomen was insufflated to 15 mmHg with CO2. A laparoscope was placed and camera inspection revealed no evidence of injury. Bilateral TAP blocks were then performed under laparoscopic visualization using a mixture of 0.25% marcaine with epinepherine + Exparel. Additional ports were then placed under direct laparoscopic visualization - two in the left hemiabdomen and one in the right abdomen. The abdomen was surveyed. The liver and peritoneum appeared normal.  There were no signs of metastatic disease.  The tattoo was readily visualized in the ascending colon.  There was a palpable mass laparoscopically to the wall the colon.  There is no evidence of  spread.  There were adhesions in the right lower quadrant from the cecum to the abdominal wall.  These were carefully taken down sharply using  laparoscopic scissors.  The patient was positioned in trendelenburg with left side down. The ileocolic pedicle was identified.  A medial to lateral approach was utilized.  The ileocolic pedicle was grasped and elevated.  The peritoneum overlying this was incised.  Gentle blunt dissection commenced around the pedicle and the duodenum was identified and freed from the surrounding structures and swept "down."  The plane the retroperitoneum was then developed.  With the duodenum being down, the plane was developed up to the hepatic flexure and then medially on the transverse mesocolon lifting this up.  Attention was then turned to division of the ileocolic pedicle.  Approximately 1 cm from its origin, the vessel had been circumferentially dissected.  This was then ligated with the Enseal device.  The pedicle was observed and noted to be hemostatic.  Following this, a lateral to medial approach was used.  The terminal ileum and cecum were grasped and gently retracted medially.  The Mabry Santarelli line of Toldt was incised sharply.  This was carried up to the hepatic flexure.  Following this, the patient was then repositioned in reverse Trendelenburg.  The omentum was grasped on the mid transverse colon and between the stomach and transverse mesocolon, open.  The lesser sac was entered.  This was developed all the way out to the hepatic flexure.  The hepatic flexure was fully mobilized.  The duodenum was again identified and protected free of any injury.  Following this, the cecum easily reached into the left upper quadrant.  Avascular lesions overlying the duodenum between this and the mesocolon had been sharply freed.  The retroperitoneum was reinspected and noted to be hemostatic.  The ileocolic pedicle was hemostatic.  Attention was turned to the extracorporeal portion of the procedure.  At this point, the abdomen was desufflated and the terminal ileum and right colon delivered through the wound protector. The terminal  ileum was then transected using a GIA blue load stapler.  The distal point of transection was identified on the transverse colon.  The middle colic vessels were identified.  There was a nice palpable pulse within this.  The distal point of transection included the right branch of the middle colic.  A window was created in the mesentery at this level.  The colon was divided using a GIA blue load stapler.  The intervening mesentery was then ligated using the Enseal device.  The mesentery was evaluated and noted to be hemostatic.  The specimen was then passed off.  The distal ileal staple line had somewhat of a C-shaped curve to it and I did not like the appearance of this.  For this reason, approximately 2 cm of additional ileum was taken.  The associated mesentery was divided using the Enseal.  This was passed off as additional specimen.  The specimen was then opened on the back table and included the known colon mass with wide margins.  The transverse colon remained perfused by the main branch of the middle colic vessel.  Both ends of the staple lines appeared pink and well perfused.  They were intact.  Attention was turned to creating the ileocolic anastomosis.  The ileum and transverse colon were aligned and a stay suture was placed.  Orientation was confirmed such that there was no twisting of the mesentery or bowel alongside of the mesentery.  An enterotomy and colotomy were  created by trimming off the antimesenteric corner of each respective staple line.  The anastomosis was then created with a single firing of the GIA blue load 75 mm stapler.  Anastomosis was then inspected and noted to be intact, complete, and hemostatic.  The common enterotomy was then closed using a firing of the TX 60 blue load stapler.  The staple lines had been offset.  The staple line had 2 small areas of oozing that were controlled with 3-0 silk U stitches.  A "crotch" suture was placed at the distal aspect of the anastomotic staple  line.  The corners of the staple lines were then dunked using 3-0 silk suture.  This was pink and well perfused.  This was then returned to the abdomen.  Omentum was placed over the anastomosis.  The laparoscopic trochars had been removed under direct visualization and the port sites were hemostatic.  The Bagley wound protector was removed.  Attention was turned to closing the abdomen.  Clean instruments, gowns, gloves were used.  Additional sterile drapes were stripped around the field.  Counts were reported correct x2.  The extraction site fascia was closed with 2 running #1 PDS sutures.  The wound was then irrigated.  Additional local anesthetic was infiltrated at this location.  Skin of all incision sites was closed with running 4-0 Monocryl subcuticular suture.  Dermabond was applied to all incisions.  The patient was then awakened from general anesthesia, extubated and transferred to a stretcher for transport to PACU in satisfactory condition. I updated her son [name] on everything as well.  DISPOSITION: PACU in satisfactory condition

## 2018-05-25 ENCOUNTER — Encounter (HOSPITAL_COMMUNITY): Payer: Self-pay | Admitting: Surgery

## 2018-05-25 DIAGNOSIS — C182 Malignant neoplasm of ascending colon: Secondary | ICD-10-CM

## 2018-05-25 LAB — CBC
HCT: 27.1 % — ABNORMAL LOW (ref 36.0–46.0)
Hemoglobin: 7.9 g/dL — ABNORMAL LOW (ref 12.0–15.0)
MCH: 25.2 pg — ABNORMAL LOW (ref 26.0–34.0)
MCHC: 29.2 g/dL — ABNORMAL LOW (ref 30.0–36.0)
MCV: 86.3 fL (ref 80.0–100.0)
Platelets: 339 10*3/uL (ref 150–400)
RBC: 3.14 MIL/uL — ABNORMAL LOW (ref 3.87–5.11)
RDW: 18.8 % — ABNORMAL HIGH (ref 11.5–15.5)
WBC: 14.6 10*3/uL — ABNORMAL HIGH (ref 4.0–10.5)
nRBC: 0 % (ref 0.0–0.2)

## 2018-05-25 LAB — BASIC METABOLIC PANEL
Anion gap: 9 (ref 5–15)
BUN: 10 mg/dL (ref 8–23)
CO2: 23 mmol/L (ref 22–32)
Calcium: 9 mg/dL (ref 8.9–10.3)
Chloride: 105 mmol/L (ref 98–111)
Creatinine, Ser: 0.87 mg/dL (ref 0.44–1.00)
GFR calc Af Amer: >60 mL/min (ref >60)
GFR calc non Af Amer: >60 mL/min (ref >60)
Glucose, Bld: 117 mg/dL — ABNORMAL HIGH (ref 70–99)
Potassium: 4.4 mmol/L (ref 3.5–5.1)
Sodium: 137 mmol/L (ref 135–145)

## 2018-05-25 MED ORDER — ENOXAPARIN SODIUM 40 MG/0.4ML ~~LOC~~ SOLN
40.0000 mg | SUBCUTANEOUS | Status: DC
  Administered 2018-05-25 – 2018-05-28 (×4): 40 mg via SUBCUTANEOUS
  Filled 2018-05-25 (×4): qty 0.4

## 2018-05-25 NOTE — Progress Notes (Signed)
Physical Therapy Treatment Patient Details Name: Julie Mcgee MRN: [id number] DOB: Sep 14, 1933 Today's Date: 05/25/2018    History of Present Illness Julie Mcgee is a 85 y.o. female presenting with a 1-2-week history of darker stools and lightheadedness, found to have a hemoglobin of 5.8 on arrival, concerning for an acute GI bleed. Mass found, concerning for malignancy; they are considering surgery; PMH is significant for recent TAVR in 02/2018 for severe aortic stenosis, Code duringthat admission (recovered) complete heart block s/p pacemaker placement in 02/2018, HFpEF, moderate mitral valve stenosis, hypothyroidism, hypertension, hyperlipidemia., CVA with residual r hemiparesis    PT Comments    Patient ambulating short distances today with poor balance and tolerance. BP 88/43 (59), RN notified. Pt presents as fall risk, rec [facility] program with HHPT if she can progress, otherwise may need to consider CIR   Follow Up Recommendations  Home health PT;Supervision/Assistance - 24 hour([facility] program. if not avail, possible CIR )     Equipment Recommendations  None recommended by PT(seems pretty well-equipped)    Recommendations for Other Services OT consult(for ADLs, and input in dc decision-making)     Precautions / Restrictions Precautions Precautions: Fall Precaution Comments: BP drop with incr time upright standing    Mobility  Bed Mobility Overal bed mobility: Needs Assistance Bed Mobility: Supine to Sit     Supine to sit: Supervision     General bed mobility comments: Supervision for safety  Transfers Overall transfer level: Needs assistance Equipment used: Rolling walker (2 wheeled) Transfers: Sit to/from Stand Sit to Stand: Min guard Stand pivot transfers: Mod assist       General transfer comment: Power up without assist; minguard for safety, and cues to self-monitor for activity tolerance  Ambulation/Gait Ambulation/Gait assistance:  Min guard(with and without physical contact) Gait Distance (Feet): 40 Feet Assistive device: Rolling walker (2 wheeled) Gait Pattern/deviations: Step-through pattern Gait velocity: decreased   General Gait Details: pt with unsteady gait, dizzziness. bp 88/43 (57)    Stairs             Wheelchair Mobility    Modified Rankin (Stroke Patients Only)       Balance Overall balance assessment: Needs assistance Sitting-balance support: Feet supported Sitting balance-Leahy Scale: Fair     Standing balance support: Bilateral upper extremity supported Standing balance-Leahy Scale: Fair(decr standing tolerance)                              Cognition Arousal/Alertness: Awake/alert Behavior During Therapy: WFL for tasks assessed/performed Overall Cognitive Status: Impaired/Different from baseline Area of Impairment: Safety/judgement                         Safety/Judgement: Decreased awareness of safety;Decreased awareness of deficits     General Comments: Took extra time to talk about safety concerns with BP drop with standing/walking 3-5 minutes; After the talk, she asked if she can walk the hallways later -- I emphasized that she MUST have staff near and recliner pulle dbehind      Exercises      General Comments        Pertinent Vitals/Pain Pain Score: 0-No pain    Home Living                      Prior Function            PT Goals (current goals  can now be found in the care plan section) Acute Rehab PT Goals Patient Stated Goal: to get home well PT Goal Formulation: With patient Time For Goal Achievement: 06/04/18 Potential to Achieve Goals: Good Progress towards PT goals: Progressing toward goals    Frequency    Min 3X/week      PT Plan     Co-evaluation              AM-PAC PT "6 Clicks" Mobility   Outcome Measure  Help needed turning from your back to your side while in a flat bed without using  bedrails?: None Help needed moving from lying on your back to sitting on the side of a flat bed without using bedrails?: A Little Help needed moving to and from a bed to a chair (including a wheelchair)?: A Little Help needed standing up from a chair using your arms (e.g., wheelchair or bedside chair)?: A Little Help needed to walk in hospital room?: A Little Help needed climbing 3-5 steps with a railing? : A Lot 6 Click Score: 18    End of Session Equipment Utilized During Treatment: Gait belt Activity Tolerance: Other (comment)(limited by orthostatic hypotension) Patient left: in chair;with call bell/phone within reach;with chair alarm set Nurse Communication: Mobility status;Other (comment)(notified NT that she will needto [facility]) PT Visit Diagnosis: Unsteadiness on feet (R26.81);Other abnormalities of gait and mobility (R26.89)     Time: 5638-7564 PT Time Calculation (min) (ACUTE ONLY): 18 min  Charges:  $Gait Training: 8-22 mins                    Reinaldo Berber, PT, DPT Acute Rehabilitation Services Pager: [phone] Office: [place] 05/25/2018, 12:57 PM

## 2018-05-25 NOTE — Progress Notes (Signed)
Subjective No acute events. Feeling great. Denies n/v. Passing flatus; ?BM. Hungry.  Objective: Vital signs in last 24 hours: Temp:  [97.6 F (36.4 C)-98.6 F (37 C)] 98.6 F (37 C) (05/14 0522) Pulse Rate:  [59-69] 69 (05/14 0522) Resp:  [14-19] 19 (05/14 0522) BP: (105-127)/(45-55) 105/46 (05/14 0522) SpO2:  [97 %-100 %] 97 % (05/14 0522) Weight:  [82.8 kg] 82.8 kg (05/13 2113) Last BM Date: 05/23/18  Intake/Output from previous day: 05/13 0701 - 05/14 0700 In: 2229 [P.O.:1040; I.V.:750] Out: 720 [Urine:700; Blood:20] Intake/Output this shift: No intake/output data recorded.  Gen: NAD, comfortable CV: RRR Pulm: Normal work of breathing Abd: Soft, not significantly tender; nondistended; incisions c/d/i without erythema or drainage Ext: SCDs in place  Lab Results: CBC  Recent Labs    05/23/18 1326 05/25/18 0440  WBC 11.6* 14.6*  HGB 8.8* 7.9*  HCT 30.1* 27.1*  PLT 429* 339   BMET Recent Labs    05/23/18 0319 05/25/18 0440  NA 138 137  K 4.3 4.4  CL 102 105  CO2 24 23  GLUCOSE 91 117*  BUN 6* 10  CREATININE 0.81 0.87  CALCIUM 8.7* 9.0   PT/INR Recent Labs    05/23/18 1326  LABPROT 15.3*  INR 1.2   ABG No results for input(s): PHART, HCO3 in the last 72 hours.  Invalid input(s): PCO2, PO2  Studies/Results:  Anti-infectives: Anti-infectives (From admission, onward)   Start     Dose/Rate Route Frequency Ordered Stop   05/24/18 0600  cefoTEtan (CEFOTAN) 2 g in sodium chloride 0.9 % 100 mL IVPB     2 g 200 mL/hr over 30 Minutes Intravenous On call to O.R. 05/23/18 1551 05/24/18 0854   05/23/18 0700  neomycin (MYCIFRADIN) tablet 1,000 mg     1,000 mg Oral 3 times daily 05/22/18 1602 05/23/18 2149   05/23/18 0700  metroNIDAZOLE (FLAGYL) tablet 1,000 mg     1,000 mg Oral 3 times daily 05/22/18 1602 05/23/18 2144       Assessment/Plan: Patient Active Problem List   Diagnosis Date Noted  . Colonic mass   . Hiatal hernia   . Gastrointestinal  hemorrhage   . Anticoagulated   . Acute upper GI bleed 05/17/2018  . Symptomatic anemia   . Complete heart block ([place]) 03/02/2018  . Sinus bradycardia   . Cardiac arrest ([place])   . 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 RBBB   . Acute on chronic diastolic (congestive) heart failure ([place])   . Severe aortic stenosis 05/31/2017  . History of stroke 05/31/2017  . Unstable gait 05/31/2017  . Recurrent falls 05/31/2017  . Hypertension 05/13/2017  . Hyperlipidemia 05/13/2017  . Hypothyroidism 05/13/2017  . Arthritis 05/13/2017   s/p Procedure(s): LAPAROSCOPIC ASSISTED ASCENDING HEMICOLECTOMY 05/24/2018  -ERAS protocol - tolerating clears; will advance to full liquids today -Ambulate 5x/day -D/C foley -D/C IVF -If hgb remains stable tomorrow, ok to restart her antiplatelet agents -PPx: SCDs; ok to start chemical dvt prophylaxis from our standpoint - will defer to medicine given recent HIT panel ordered  LOS: 8 days   Sharon Mt. Dema Severin, M.D. [facility]

## 2018-05-25 NOTE — Discharge Instructions (Addendum)
You were admitted for low blood count or anemia. You were given blood and found to have a mass in your colon that was removed.  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  It is very important that you remain mobile in the coming weeks.   -Surgery recommends that you move around at least 5 times per day.  It will be very important that he continue working with home health physical therapy. -You need to follow-up with surgery in 2 weeks. -Continue taking all of your medications including your aspirin and Plavix daily. -We have started you on pantoprazole to take daily to protect your stomach and Florastor to take twice daily to also help with your digestion. -We have given you a few pain pills to take as needed. -We have also changed your iron supplementation to every other day. -Please be sure to follow-up at the cancer center in order to have a biopsy performed on your right breast mass. -Please be sure to follow-up with your primary care provider in order to check your hemoglobin at some point next week to make sure that it remains stable. -Do not hesitate to call your doctor or return to the hospital if you are experiencing excessive dizziness, lightheadedness or shortness of breath.  [facility], [place] [phone]  ABDOMINAL SURGERY: POST OP INSTRUCTIONS  Always review your discharge instruction sheet given to you by the facility where your surgery was performed.  IF YOU HAVE DISABILITY OR FAMILY LEAVE FORMS, YOU MUST BRING THEM TO THE OFFICE FOR PROCESSING.  PLEASE DO NOT GIVE THEM TO YOUR DOCTOR.  1. A prescription for pain medication may be given to you upon discharge.  Take your pain medication as prescribed, if needed.  If narcotic pain medicine is not needed, then you may take acetaminophen (Tylenol) or ibuprofen (Advil) as needed. 2. Take your usually prescribed medications unless otherwise directed. 3. If you need a refill on your pain medication, please  contact your pharmacy. They will contact our office to request authorization.  Prescriptions will not be filled after 5pm or on week-ends. 4. You should follow a light diet the first few days after arrival home, such as soup and crackers, pudding, etc.unless your doctor has advised otherwise. A high-fiber, low fat diet can be resumed as tolerated.   Be sure to include lots of fluids daily. Most patients will experience some swelling and bruising on the chest and neck area.  Ice packs will help.  Swelling and bruising can take several days to resolve 5. Most patients will experience some swelling and bruising in the area of the incision. Ice pack will help. Swelling and bruising can take several days to resolve..  6. It is common to experience some constipation if taking pain medication after surgery.  Increasing fluid intake and taking a stool softener will usually help or prevent this problem from occurring.  A mild laxative (Milk of Magnesia or Miralax) should be taken according to package directions if there are no bowel movements after 48 hours. 7. You may shower. 8. ACTIVITIES:  You may resume regular (light) daily activities beginning the next day--such as daily self-care, walking, climbing stairs--gradually increasing activities as tolerated.  You may have sexual intercourse when it is comfortable.  Refrain from any heavy lifting or straining until approved by your doctor. a. You may drive when you no longer are taking prescription pain medication, you can comfortably wear a seatbelt, and you can safely  maneuver your car and apply brakes 9. You should see your doctor in the office for a follow-up appointment approximately two weeks after your surgery.  Make sure that you call for this appointment within a day or two after you arrive home to insure a convenient appointment time. OTHER INSTRUCTIONS:   _____________________________________________________________ _____________________________________________________________  WHEN TO CALL YOUR DOCTOR: 1. Fever over 101.0 2. Inability to urinate 3. Nausea and/or vomiting 4. Extreme swelling or bruising 5. Continued bleeding from incision. 6. Increased pain, redness, or drainage from the incision. 7. Difficulty swallowing or breathing 8. Muscle cramping or spasms. 9. Numbness or tingling in hands or feet or around lips.  The clinic staff is available to answer your questions during regular business hours.  Please dont hesitate to call and ask to speak to one of the nurses if you have concerns.  For further questions, please visit [email or web address]

## 2018-05-25 NOTE — Progress Notes (Signed)
Occupational Therapy Treatment Patient Details Name: Julie Mcgee MRN: [id number] DOB: [birth year] Today's Date: 05/25/2018    History of present illness Julie Mcgee is a 85 y.o. female presenting with a 1-2-week history of darker stools and lightheadedness, found to have a hemoglobin of 5.8 on arrival, concerning for an acute GI bleed. Mass found, concerning for malignancy; they are considering surgery; PMH is significant for recent TAVR in 02/2018 for severe aortic stenosis, Code duringthat admission (recovered) complete heart block s/p pacemaker placement in 02/2018, HFpEF, moderate mitral valve stenosis, hypothyroidism, hypertension, hyperlipidemia., CVA with residual r hemiparesis.  LAPAROSCOPIC ASSISTED ASCENDING HEMICOLECTOMY 05/24/2018   OT comments  Pt with good participation with OT.  Pt did have incontinence of stool upon standing and did  Not have awareness  Follow Up Recommendations  SNF;Home health OT;Other (comment)(depending on care at home)    Equipment Recommendations  None recommended by OT    Recommendations for Other Services      Precautions / Restrictions Precautions Precautions: Fall       Mobility Bed Mobility Overal bed mobility: Modified Independent Bed Mobility: Supine to Sit     Supine to sit: [facility] elevated;Min assist        Transfers Overall transfer level: Needs assistance Equipment used: Rolling walker (2 wheeled) Transfers: Sit to/from [facility] Sit to Stand: Mod assist Stand pivot transfers: Mod assist            Balance Overall balance assessment: Needs assistance Sitting-balance support: Feet supported Sitting balance-Leahy Scale: Fair     Standing balance support: Bilateral upper extremity supported Standing balance-Leahy Scale: Fair                             ADL either performed or assessed with clinical judgement   ADL Overall ADL's : Needs assistance/impaired     Grooming: Wash/dry  hands;Set up;Standing       Lower Body Bathing: Moderate assistance;Sit to/from stand;Cueing for sequencing;Cueing for safety Lower Body Bathing Details (indicate cue type and reason): standing at sink         Toilet Transfer: Moderate assistance;Stand-pivot;BSC;Cueing for safety;Cueing for sequencing   Toileting- Clothing Manipulation and Hygiene: Sit to/from stand;Moderate assistance;Cueing for safety;Cueing for sequencing         General ADL Comments: pt incontinent of bowels upon standing.       Vision Baseline Vision/History: Wears glasses            Cognition Arousal/Alertness: Awake/alert Behavior During Therapy: WFL for tasks assessed/performed Overall Cognitive Status: Within Functional Limits for tasks assessed                                 General Comments: pt with increased awarenesss of fall risk this OT session                   Pertinent Vitals/ Pain       Pain Score: 0-No pain     Prior Functioning/Environment              Frequency  Min 2X/week        Progress Toward Goals  OT Goals(current goals can now be found in the care plan section)  Progress towards OT goals: Progressing toward goals     Plan Discharge plan remains appropriate       AM-PAC OT "6 Clicks" Daily Activity  Outcome Measure   Help from another person eating meals?: None Help from another person taking care of personal grooming?: A Little Help from another person toileting, which includes using toliet, bedpan, or urinal?: A Lot Help from another person bathing (including washing, rinsing, drying)?: A Lot Help from another person to put on and taking off regular upper body clothing?: A Little Help from another person to put on and taking off regular lower body clothing?: A Lot 6 Click Score: 16    End of Session Equipment Utilized During Treatment: Gait belt;Rolling walker  OT Visit Diagnosis: Muscle weakness (generalized) (M62.81)    Activity Tolerance Patient tolerated treatment well;Other (comment)(treatment limited by BP)   Patient Left in chair;with call bell/phone within reach;with chair alarm set   Nurse Communication          Time: [phone] OT Time Calculation (min): 29 min  Charges: OT General Charges $OT Visit: 1 Visit OT Treatments $Self Care/Home Management : 23-37 mins  Kari Baars, [place] [phone] Office- [phone]      [place], [place] 05/25/2018, 10:52 AM

## 2018-05-25 NOTE — Progress Notes (Addendum)
Family Medicine Teaching Service Daily Progress Note Intern Pager: [phone]  [name] Medical record number: [id number] Date of birth: 02-14-1933 Age: 85 y.o. Gender: female  Primary Care Provider: Lyman Bishop, DO Consultants: GI, gen surg Code Status: full  Pt Overview and Major Events to Date:    Assessment and Plan: Julie Mcgee a 85 y.o.femalepresentingsymptomatic anemia to 5.8, found to have a colon mass concerning for malignancy. PMH is significant forrecent TAVR in 02/2018 for severe aortic stenosis, complete heart block s/p pacemaker placement in 02/2018, HFpEF, moderate mitral valve stenosis, hypothyroidism, hypertension, hyperlipidemia.  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  S/p  hemicolectomy on 5/13.per surgery, advancing diet, can restart AC and restart DAPT if hgb stable tomorrow.  son is HCPOA and would like to be called when speaking with pt. Julie Mcgee, [phone].  - Gen surg following, appreciate recs -Continue holding home aspirin and Plavix, SCDs in place -Monitor CBC  Right breast mass - incidental pick up on CT.  2.6cm mass in right breast.  Will work up outpatient.   - IR consulted for breast biopsy, appreciate recs.      Aorticseverestenosis s/p TAVRFebruary 2020,complicated by cardiac arrest:Stable.  TAVR functioning appropriately. -Follow-up cardiology recommendations,  -Holding home aspirin and Plavix in setting of above  Chronic HFpEF with EF 60-65%:Stable. Patient appears euvolemic on exam.  -Cardiology on board, appreciate recommendations -Blood pressure control as below  Complete heart block, s/p pacemaker placement:Chronic, stable. Pacemaker placed in 02/2018. HR in60s-70s  - Monitor telemetry - Continue home metoprolol tartrate 50mg  BID  Previous CVA: Chronic, stable. - Hold home aspirin for above - Continue home statin  Hypothyroidism: Chronic,  stable. TSH 3.3 in 02/2018.  - Continue home Synthroid125 mcg  Hypertension: Chronic, stable. Normotensive currently.  Having orthostatic hypotension when working with PT.  -Monitor BP - Continue home Lopressor 50 mg twice daily  Hyperlipidemia: Chronic, stable. Takes simvastatin 80 mg daily. - Continue statin, substitute with atorvastatin formulary  Perirectal skin breakdown:Acute, improving. - Barrier cream as needed - Continue to monitor  FEN/GI:Full liquid diet per GI, pantoprazole.  Prophylaxis: lovenox  Disposition: pending, likely SNF  Subjective:  Patient feels well today.  She is not having any abdominal pain.  She has been able to drink liquids without difficulty.  She says she wants to go home soon.   Objective: Temp:  [97.6 F (36.4 C)-98.6 F (37 C)] 98.6 F (37 C) (05/14 0522) Pulse Rate:  [59-69] 69 (05/14 0522) Resp:  [14-19] 19 (05/14 0522) BP: (105-127)/(45-55) 105/46 (05/14 0522) SpO2:  [97 %-100 %] 97 % (05/14 0522) Weight:  [82.8 kg] 82.8 kg (05/13 2113) Physical Exam: General: alert and oriented.  No acute distress.  CV: regular rrhythm. Normal rate.  Pulm: lctab anteriorly.  GI: well healing laproscopic incisions.    Laboratory: Recent Labs  Lab 05/23/18 0319 05/23/18 1326 05/25/18 0440  WBC 7.1 11.6* 14.6*  HGB 7.9* 8.8* 7.9*  HCT 26.9* 30.1* 27.1*  PLT 98* 429* 339   Recent Labs  Lab 05/19/18 0308  05/22/18 0654 05/23/18 0319 05/25/18 0440  NA 140   < > 138 138 137  K 3.8   < > 4.7 4.3 4.4  CL 106   < > 106 102 105  CO2 23   < > 25 24 23   BUN 11   < > 7* 6* 10  CREATININE 0.85   < > 0.86 0.81 0.87  CALCIUM 8.8*   < > 8.8*  8.7* 9.0  PROT 5.4*  --   --   --   --   BILITOT 1.1  --   --   --   --   ALKPHOS 64  --   --   --   --   ALT 9  --   --   --   --   AST 16  --   --   --   --   GLUCOSE 92   < > 95 91 117*   < > = values in this interval not displayed.    Imaging/Diagnostic Tests:   Benay Pike,  MD 05/25/2018, 6:50 AM PGY-1, [place] Intern pager: [phone], text pages welcome

## 2018-05-26 LAB — BASIC METABOLIC PANEL
Anion gap: 7 (ref 5–15)
BUN: 10 mg/dL (ref 8–23)
CO2: 26 mmol/L (ref 22–32)
Calcium: 8.7 mg/dL — ABNORMAL LOW (ref 8.9–10.3)
Chloride: 103 mmol/L (ref 98–111)
Creatinine, Ser: 0.86 mg/dL (ref 0.44–1.00)
GFR calc Af Amer: >60 mL/min (ref >60)
GFR calc non Af Amer: >60 mL/min (ref >60)
Glucose, Bld: 100 mg/dL — ABNORMAL HIGH (ref 70–99)
Potassium: 4.3 mmol/L (ref 3.5–5.1)
Sodium: 136 mmol/L (ref 135–145)

## 2018-05-26 LAB — CBC WITH DIFFERENTIAL/PLATELET
Abs Immature Granulocytes: 0.03 10*3/uL (ref 0.00–0.07)
Basophils Absolute: 0.1 10*3/uL (ref 0.0–0.1)
Basophils Relative: 1 %
Eosinophils Absolute: 0.8 10*3/uL — ABNORMAL HIGH (ref 0.0–0.5)
Eosinophils Relative: 7 %
HCT: 25.3 % — ABNORMAL LOW (ref 36.0–46.0)
Hemoglobin: 7.3 g/dL — ABNORMAL LOW (ref 12.0–15.0)
Immature Granulocytes: 0 %
Lymphocytes Relative: 14 %
Lymphs Abs: 1.7 10*3/uL (ref 0.7–4.0)
MCH: 24.9 pg — ABNORMAL LOW (ref 26.0–34.0)
MCHC: 28.9 g/dL — ABNORMAL LOW (ref 30.0–36.0)
MCV: 86.3 fL (ref 80.0–100.0)
Monocytes Absolute: 1.2 10*3/uL — ABNORMAL HIGH (ref 0.1–1.0)
Monocytes Relative: 10 %
Neutro Abs: 7.7 10*3/uL (ref 1.7–7.7)
Neutrophils Relative %: 68 %
Platelets: 306 10*3/uL (ref 150–400)
RBC: 2.93 MIL/uL — ABNORMAL LOW (ref 3.87–5.11)
RDW: 19.7 % — ABNORMAL HIGH (ref 11.5–15.5)
WBC: 11.4 10*3/uL — ABNORMAL HIGH (ref 4.0–10.5)
nRBC: 0 % (ref 0.0–0.2)

## 2018-05-26 MED ORDER — ASPIRIN EC 81 MG PO TBEC
81.0000 mg | DELAYED_RELEASE_TABLET | Freq: Every day | ORAL | Status: DC
  Administered 2018-05-26 – 2018-05-28 (×3): 81 mg via ORAL
  Filled 2018-05-26 (×3): qty 1

## 2018-05-26 MED ORDER — CLOPIDOGREL BISULFATE 75 MG PO TABS
75.0000 mg | ORAL_TABLET | Freq: Every day | ORAL | Status: DC
  Administered 2018-05-26 – 2018-05-28 (×3): 75 mg via ORAL
  Filled 2018-05-26 (×3): qty 1

## 2018-05-26 NOTE — Progress Notes (Signed)
Physical Therapy Treatment Patient Details Name: Julie Mcgee MRN: [id number] DOB: December 28, 1933 Today's Date: 05/26/2018    History of Present Illness Julie Mcgee is a 85 y.o. female presenting with a 1-2-week history of darker stools and lightheadedness, found to have a hemoglobin of 5.8 on arrival, concerning for an acute GI bleed. Mass found, concerning for malignancy; they are considering surgery; PMH is significant for recent TAVR in 02/2018 for severe aortic stenosis, Code duringthat admission (recovered) complete heart block s/p pacemaker placement in 02/2018, HFpEF, moderate mitral valve stenosis, hypothyroidism, hypertension, hyperlipidemia., CVA with residual r hemiparesis    PT Comments    Pt ambulating with slightly improved balance today and tolerance, reports she is feeling stronger. Standing without assistance. Due to mild cognitive deficits, balance, and weakness, cont to rec that patient has 24/7 support at home to reduce risk of falls. Discussed with LCSW and RN my rec for [place], or that patients son stay with her for a week or two while she continues with HHPT. Results of these requests are pending at this time per LCSW.     Follow Up Recommendations  Home health PT;Supervision/Assistance - 24 hour([facility] program. if not avail, possible CIR )     Equipment Recommendations  None recommended by PT(seems pretty well-equipped)    Recommendations for Other Services OT consult(for ADLs, and input in dc decision-making)     Precautions / Restrictions Precautions Precautions: Fall Precaution Comments: BP drop with incr time upright standing    Mobility  Bed Mobility Overal bed mobility: Needs Assistance Bed Mobility: Supine to Sit     Supine to sit: Supervision     General bed mobility comments: Supervision for safety  Transfers Overall transfer level: Needs assistance Equipment used: Rolling walker (2 wheeled) Transfers: Sit to/from  Stand Sit to Stand: Min guard         General transfer comment: able to stand without assistance today, stool incontinence upon standing   Ambulation/Gait Ambulation/Gait assistance: Min guard(with and without physical contact) Gait Distance (Feet): 50 Feet Assistive device: Rolling walker (2 wheeled) Gait Pattern/deviations: Step-through pattern Gait velocity: decreased   General Gait Details: increased distance today, no dizziness reported.    Stairs             Wheelchair Mobility    Modified Rankin (Stroke Patients Only)       Balance Overall balance assessment: Needs assistance Sitting-balance support: Feet supported Sitting balance-Leahy Scale: Fair     Standing balance support: Bilateral upper extremity supported Standing balance-Leahy Scale: Fair(decr standing tolerance)                              Cognition Arousal/Alertness: Awake/alert Behavior During Therapy: WFL for tasks assessed/performed Overall Cognitive Status: Impaired/Different from baseline Area of Impairment: Safety/judgement                         Safety/Judgement: Decreased awareness of safety;Decreased awareness of deficits     General Comments: Took extra time to talk about safety concerns with BP drop with standing/walking 3-5 minutes; After the talk, she asked if she can walk the hallways later -- I emphasized that she MUST have staff near and recliner pulle dbehind      Exercises      General Comments        Pertinent Vitals/Pain      Home Living  Prior Function            PT Goals (current goals can now be found in the care plan section) Acute Rehab PT Goals Patient Stated Goal: to get home well PT Goal Formulation: With patient Time For Goal Achievement: 06/04/18 Potential to Achieve Goals: Good Progress towards PT goals: Progressing toward goals    Frequency    Min 3X/week      PT Plan      Co-evaluation              AM-PAC PT "6 Clicks" Mobility   Outcome Measure  Help needed turning from your back to your side while in a flat bed without using bedrails?: None Help needed moving from lying on your back to sitting on the side of a flat bed without using bedrails?: A Little Help needed moving to and from a bed to a chair (including a wheelchair)?: A Little Help needed standing up from a chair using your arms (e.g., wheelchair or bedside chair)?: A Little Help needed to walk in hospital room?: A Little Help needed climbing 3-5 steps with a railing? : A Lot 6 Click Score: 18    End of Session Equipment Utilized During Treatment: Gait belt Activity Tolerance: Other (comment)(limited by orthostatic hypotension) Patient left: in chair;with call bell/phone within reach;with chair alarm set Nurse Communication: Mobility status;Other (comment)(notified NT that she will needto [facility] to [facility]) PT Visit Diagnosis: Unsteadiness on feet (R26.81);Other abnormalities of gait and mobility (R26.89)     Time: 6433-2951 PT Time Calculation (min) (ACUTE ONLY): 28 min  Charges:  $Gait Training: 8-22 mins $Therapeutic Activity: 8-22 mins                     Reinaldo Berber, PT, DPT Acute Rehabilitation Services Pager: [phone] Office: [place] 05/26/2018, 10:14 AM

## 2018-05-26 NOTE — Progress Notes (Addendum)
Subjective No acute events. Feeling great. Denies n/v. Passing flatus; BM x2 - denies blood in stool. Denies pain. Working with therapies  Objective: Vital signs in last 24 hours: Temp:  [98.2 F (36.8 C)-99 F (37.2 C)] 99 F (37.2 C) (05/15 0408) Pulse Rate:  [69-82] 71 (05/15 0408) Resp:  [18-20] 20 (05/15 0408) BP: (112-131)/(48-50) 131/50 (05/15 0408) SpO2:  [95 %-98 %] 98 % (05/15 0408) Weight:  [84.3 kg] 84.3 kg (05/14 2116) Last BM Date: 05/25/18  Intake/Output from previous day: 05/14 0701 - 05/15 0700 In: 370 [P.O.:370] Out: 300 [Urine:150; Stool:150] Intake/Output this shift: No intake/output data recorded.  Gen: NAD, comfortable CV: RRR Pulm: Normal work of breathing Abd: Soft, nontender; nondistended; incisions c/d/i without erythema or drainage Ext: SCDs in place  Lab Results: CBC  Recent Labs    05/25/18 0440 05/26/18 0357  WBC 14.6* 11.4*  HGB 7.9* 7.3*  HCT 27.1* 25.3*  PLT 339 306   BMET Recent Labs    05/25/18 0440 05/26/18 0357  NA 137 136  K 4.4 4.3  CL 105 103  CO2 23 26  GLUCOSE 117* 100*  BUN 10 10  CREATININE 0.87 0.86  CALCIUM 9.0 8.7*   PT/INR Recent Labs    05/23/18 1326  LABPROT 15.3*  INR 1.2   ABG No results for input(s): PHART, HCO3 in the last 72 hours.  Invalid input(s): PCO2, PO2  Studies/Results:  Anti-infectives: Anti-infectives (From admission, onward)   Start     Dose/Rate Route Frequency Ordered Stop   05/24/18 0600  cefoTEtan (CEFOTAN) 2 g in sodium chloride 0.9 % 100 mL IVPB     2 g 200 mL/hr over 30 Minutes Intravenous On call to O.R. 05/23/18 1551 05/24/18 0854   05/23/18 0700  neomycin (MYCIFRADIN) tablet 1,000 mg     1,000 mg Oral 3 times daily 05/22/18 1602 05/23/18 2149   05/23/18 0700  metroNIDAZOLE (FLAGYL) tablet 1,000 mg     1,000 mg Oral 3 times daily 05/22/18 1602 05/23/18 2144       Assessment/Plan: Patient Active Problem List   Diagnosis Date Noted  . Malignant neoplasm of  ascending colon ([place])   . Colonic mass   . Hiatal hernia   . Gastrointestinal hemorrhage   . Anticoagulated   . Acute upper GI bleed 05/17/2018  . Symptomatic anemia   . Complete heart block ([place]) 03/02/2018  . Sinus bradycardia   . Cardiac arrest ([place])   . 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 RBBB   . Acute on chronic diastolic (congestive) heart failure ([place])   . Severe aortic stenosis 05/31/2017  . History of stroke 05/31/2017  . Unstable gait 05/31/2017  . Recurrent falls 05/31/2017  . Hypertension 05/13/2017  . Hyperlipidemia 05/13/2017  . Hypothyroidism 05/13/2017  . Arthritis 05/13/2017   s/p Procedure(s): LAPAROSCOPIC ASSISTED ASCENDING HEMICOLECTOMY 05/24/2018  -ERAS protocol - now on soft diet and tolerating -Ambulate 5x/day -Ok to restart her antiplatelet agents - repeat CBC tomorrow - if stable, ok for discharge from our standpoint provided safe disposition as per PT/OT -PPx: SCDs; lovenox 40mg /day -I have attempted to update her son on everything as well but have gotten his voicemail twice this morning  LOS: 9 days   Sharon Mt. Dema Severin, M.D. [facility]

## 2018-05-26 NOTE — Progress Notes (Signed)
Pathology returned 1. Colon, segmental resection for tumor, right Ascending colon and terminal ileum - INVASIVE ADENOCARCINOMA, MODERATELY DIFFERENTIATED, SPANNING 2.5 CM. - CARCINOMA ARISES FROM A TUBULAR ADENOMA. - TUMOR INVADES INTO SUBMUCOSA. - RESECTION MARGINS ARE NEGATIVE. - TUBULAR ADENOMA (X1). - EIGHTEEN OF EIGHTEEN LYMPH NODES NEGATIVE FOR CARCINOMA (0/18). - SEE ONCOLOGY TABLE. 2. Small intestine, resection, ileum additional margin - BENIGN SMALL BOWEL MUCOSA. - NO DYSPLASIA OR MALIGNANCY.  This was reviewed with her and additionally her son [name]. We discussed NCCN surveillance recommendations. We also discussed impotence of breast mass workup for possible breast cancer. They are planning outpatient evaluation for biopsy/oncology per medical oncology  Sharon Mt. Dema Severin, M.D. [facility]

## 2018-05-26 NOTE — Care Management Important Message (Signed)
Important Message  Patient Details  Name: Julie Mcgee MRN: [id number] Date of Birth: 1933-02-16   Medicare Important Message Given:  Yes    Orbie Pyo 05/26/2018, 2:28 PM

## 2018-05-26 NOTE — TOC Transition Note (Signed)
Transition of Care [facility]) - CM/SW Discharge Note   Patient Details  Name: Julie Mcgee MRN: [id number] Date of Birth: 12-17-1933  [facility] CM/SW Contact:  Ella Bodo, RN Phone Number: 05/26/2018, 4:46 PM   Clinical Narrative:   Pt admitted on 05/17/18 with anemia and GI bleed; found to have colon cancer.  Pt s/p Rt hemicolectomy on 05/24/2018.  PTA, pt independent with assistive devices; lives with spouse.  Pt active with [facility] prior to admission, and wishes to continue with this agency.  Orders obtained for [facility] home [facility] to include RN, PT, OT and aide.   Pt requests 3 in 1 for home use.  Referral to [place] for DMe needs.  Notified [facility] of likely dc on 05/27/2018 and need for continuation of [place] services.  Called pt's son, and updated him on home arrangements.  He states that he and his siblings are planning on providing 24h assistance for first week or so, depending on progress.       Final next level of care: [place] Barriers to Discharge: No Barriers Identified   Patient Goals and CMS Choice Patient states their goals for this hospitalization and ongoing recovery are:: to get back home CMS Medicare.gov Compare Post Acute Care list provided to:: Patient Choice offered to / list presented to : Patient           Discharge Plan and Services   Discharge Planning Services: CM Consult Post Acute Care Choice: Home Health          DME Arranged: 3-N-1 DME Agency: [employer] Date DME Agency Contacted: 05/26/18 Time DME Agency Contacted: 73 Representative spoke with at DME Agency: Andree Coss HH Arranged: RN, Disease Management, PT, OT, Nurse's Aide [place] Agency: [facility] Date [place]: 05/26/18 Time [place]: 1617 Representative spoke with at [place]: [place]    Readmission Risk Interventions Readmission Risk Prevention Plan 05/26/2018  Transportation Screening Complete   PCP or Specialist Appt within 3-5 Days Not Complete  Not Complete comments Follow up appt 06/13/18 per MD arrangements  [place] or [place] Complete  Social Work Consult for [place] Planning/Counseling Patient refused  Palliative Care Screening Not Applicable  Medication Review Press photographer) Complete  Some recent data might be hidden   Reinaldo Raddle, RN, BSN  Trauma/Neuro ICU Case Manager [phone]

## 2018-05-26 NOTE — Progress Notes (Signed)
Family Medicine Teaching Service Daily Progress Note Intern Pager: [phone]  [name] Medical record number: [id number] Date of birth: [birth year] Age: 85 y.o. Gender: female  Primary Care Provider: Lyman Bishop, DO Consultants: GI, gen surg Code Status: full  Pt Overview and Major Events to Date:  5/13 - hemicolectomy  Assessment and Plan: Julie Munrois a 85 y.o.femalepresentingsymptomatic anemia to 5.8, found to have invasive adenocarcinoma of the ascending colon, now s/p resection.[name] PMH is significant forrecent TAVR in 02/2018 for severe aortic stenosis, complete heart block s/p pacemaker placement in 02/2018, HFpEF, moderate mitral valve stenosis, hypothyroidism, hypertension, hyperlipidemia.  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  S/p  hemicolectomy on 5/13.per surgery, may go tomorrow if hgb stable. son is HCPOA and would like to be called when speaking with pt. Julie Mcgee, [phone].  - Gen surg following, appreciate recs -restart home aspirin and Plavix, SCDs in place -Monitor CBC  Right breast mass - incidental pick up on CT.  2.6cm mass in right breast.  Will work up outpatient.   - IR consulted for breast biopsy, appreciate recs.      Aorticseverestenosis s/p TAVRFebruary 2020,complicated by cardiac arrest:Stable.  TAVR functioning appropriately. -Follow-up cardiology recommendations,  -restarted DAPT  Chronic HFpEF with EF 60-65%:Stable. Patient appears euvolemic on exam.  -Cardiology on board, appreciate recommendations -Blood pressure control as below  Complete heart block, s/p pacemaker placement:Chronic, stable. Pacemaker placed in 02/2018. HR in60s-70s  - Monitor telemetry - Continue home metoprolol tartrate 50mg  BID  Previous CVA: Chronic, stable. - Hold home aspirin for above - Continue home statin  Hypothyroidism: Chronic, stable. TSH 3.3 in 02/2018.  - Continue  home Synthroid125 mcg  Hypertension: Chronic, stable. Normotensive currently.  Having orthostatic hypotension when working with PT.  -Monitor BP - Continue home Lopressor 50 mg twice daily  Hyperlipidemia: Chronic, stable. Takes simvastatin 80 mg daily. - Continue statin, substitute with atorvastatin formulary  Perirectal skin breakdown:Acute, improving. - Barrier cream as needed - Continue to monitor  FEN/GI:Full liquid diet per GI, pantoprazole.  Prophylaxis: lovenox  Disposition: pending, likely SNF  Subjective:  Pt feels great today.  Had some stomach pain with breakfast but not much.  She ate eggs, bacon, and toast.  She had a bm yesterday.  No nausea or vomiting.    Objective: Temp:  [98.2 F (36.8 C)-99 F (37.2 C)] 99 F (37.2 C) (05/15 0408) Pulse Rate:  [69-82] 71 (05/15 0408) Resp:  [18-20] 20 (05/15 0408) BP: (112-131)/(48-50) 131/50 (05/15 0408) SpO2:  [95 %-98 %] 98 % (05/15 0408) Weight:  [84.3 kg] 84.3 kg (05/14 2116) Physical Exam: General: alert and oriented.  No acute distress.  CV: regular rrhythm. Normal rate.  Pulm: lctab anteriorly.  GI: well healing laproscopic incisions.  Some mild tenderness to palpation over incision sites.   Laboratory: Recent Labs  Lab 05/23/18 1326 05/25/18 0440 05/26/18 0357  WBC 11.6* 14.6* 11.4*  HGB 8.8* 7.9* 7.3*  HCT 30.1* 27.1* 25.3*  PLT 429* 339 306   Recent Labs  Lab 05/23/18 0319 05/25/18 0440 05/26/18 0357  NA 138 137 136  K 4.3 4.4 4.3  CL 102 105 103  CO2 24 23 26   BUN 6* 10 10  CREATININE 0.81 0.87 0.86  CALCIUM 8.7* 9.0 8.7*  GLUCOSE 91 117* 100*    Imaging/Diagnostic Tests:   Benay Pike, MD 05/26/2018, 9:32 AM PGY-1, [place] Intern pager: [phone], text pages welcome

## 2018-05-27 LAB — CBC
HCT: 24.1 % — ABNORMAL LOW (ref 36.0–46.0)
Hemoglobin: 7.1 g/dL — ABNORMAL LOW (ref 12.0–15.0)
MCH: 25.1 pg — ABNORMAL LOW (ref 26.0–34.0)
MCHC: 29.5 g/dL — ABNORMAL LOW (ref 30.0–36.0)
MCV: 85.2 fL (ref 80.0–100.0)
Platelets: 276 10*3/uL (ref 150–400)
RBC: 2.83 MIL/uL — ABNORMAL LOW (ref 3.87–5.11)
RDW: 19.9 % — ABNORMAL HIGH (ref 11.5–15.5)
WBC: 9.2 10*3/uL (ref 4.0–10.5)
nRBC: 0 % (ref 0.0–0.2)

## 2018-05-27 MED ORDER — PANTOPRAZOLE SODIUM 40 MG PO TBEC: 40.0000 mg | DELAYED_RELEASE_TABLET | Freq: Every day | ORAL | 0 refills | Status: DC

## 2018-05-27 MED ORDER — IRON DEXTRAN 50 MG/ML IJ SOLN: 100.0000 mg | Freq: Once | INTRAMUSCULAR | Status: DC

## 2018-05-27 MED ORDER — FERROUS SULFATE 325 (65 FE) MG PO TABS: 325.0000 mg | ORAL_TABLET | ORAL | 0 refills | Status: DC

## 2018-05-27 MED ORDER — SODIUM CHLORIDE 0.9 % IV SOLN
510.0000 mg | Freq: Once | INTRAVENOUS | Status: AC
  Administered 2018-05-27: 510 mg via INTRAVENOUS
  Filled 2018-05-27: qty 17

## 2018-05-27 MED ORDER — OXYCODONE HCL 5 MG PO TABS: 5.0000 mg | ORAL_TABLET | ORAL | 0 refills | Status: DC | PRN

## 2018-05-27 MED ORDER — SACCHAROMYCES BOULARDII 250 MG PO CAPS: 250.0000 mg | ORAL_CAPSULE | Freq: Two times a day (BID) | ORAL | 0 refills | Status: DC

## 2018-05-27 NOTE — Progress Notes (Signed)
Subjective No acute events. Feeling great. Denies n/v. ++BMs and flatus. Denies blood in stool. Denies pain. Working with therapies  Objective: Vital signs in last 24 hours: Temp:  [98 F (36.7 C)-99.7 F (37.6 C)] 98 F (36.7 C) (05/16 0906) Pulse Rate:  [64-97] 64 (05/16 0906) Resp:  [16-18] 18 (05/16 0906) BP: (92-139)/(46-64) 110/48 (05/16 0906) SpO2:  [93 %-99 %] 96 % (05/16 0906) Last BM Date: 05/26/18  Intake/Output from previous day: 05/15 0701 - 05/16 0700 In: 960 [P.O.:960] Out: 200 [Urine:200] Intake/Output this shift: No intake/output data recorded.  Gen: NAD, comfortable CV: RRR Pulm: Normal work of breathing Abd: Soft, nontender; nondistended; incisions c/d/i without erythema or drainage Ext: SCDs in place  Lab Results: CBC  Recent Labs    05/26/18 0357 05/27/18 0528  WBC 11.4* 9.2  HGB 7.3* 7.1*  HCT 25.3* 24.1*  PLT 306 276   BMET Recent Labs    05/25/18 0440 05/26/18 0357  NA 137 136  K 4.4 4.3  CL 105 103  CO2 23 26  GLUCOSE 117* 100*  BUN 10 10  CREATININE 0.87 0.86  CALCIUM 9.0 8.7*   PT/INR No results for input(s): LABPROT, INR in the last 72 hours. ABG No results for input(s): PHART, HCO3 in the last 72 hours.  Invalid input(s): PCO2, PO2  Studies/Results:  Anti-infectives: Anti-infectives (From admission, onward)   Start     Dose/Rate Route Frequency Ordered Stop   05/24/18 0600  cefoTEtan (CEFOTAN) 2 g in sodium chloride 0.9 % 100 mL IVPB     2 g 200 mL/hr over 30 Minutes Intravenous On call to O.R. 05/23/18 1551 05/24/18 0854   05/23/18 0700  neomycin (MYCIFRADIN) tablet 1,000 mg     1,000 mg Oral 3 times daily 05/22/18 1602 05/23/18 2149   05/23/18 0700  metroNIDAZOLE (FLAGYL) tablet 1,000 mg     1,000 mg Oral 3 times daily 05/22/18 1602 05/23/18 2144       Assessment/Plan: Patient Active Problem List   Diagnosis Date Noted  . Malignant neoplasm of ascending colon ([place])   . Colonic mass   . Hiatal hernia   .  Gastrointestinal hemorrhage   . Anticoagulated   . Acute upper GI bleed 05/17/2018  . Symptomatic anemia   . Complete heart block ([place]) 03/02/2018  . Sinus bradycardia   . Cardiac arrest ([place])   . 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 RBBB   . Acute on chronic diastolic (congestive) heart failure ([place])   . Severe aortic stenosis 05/31/2017  . History of stroke 05/31/2017  . Unstable gait 05/31/2017  . Recurrent falls 05/31/2017  . Hypertension 05/13/2017  . Hyperlipidemia 05/13/2017  . Hypothyroidism 05/13/2017  . Arthritis 05/13/2017   s/p Procedure(s): LAPAROSCOPIC ASSISTED ASCENDING HEMICOLECTOMY 05/24/2018  -ERAS protocol - on soft diet and tolerating -Ambulate 5x/day -Hgb stable, ok for discharge from our standpoint provided safe disposition as per PT/OT -PPx: SCDs; lovenox 40mg /day -She may follow-up with me in 2 weeks in the office as well - placed in AVS   LOS: 10 days   Sharon Mt. Dema Severin, M.D. [facility]

## 2018-05-27 NOTE — Progress Notes (Signed)
Occupational Therapy Treatment Patient Details Name: Julie Mcgee MRN: [id number] DOB: [birth year] Today's Date: 05/27/2018    History of present illness Julie Mcgee is a 85 y.o. female presenting with a 1-2-week history of darker stools and lightheadedness, found to have a hemoglobin of 5.8 on arrival, concerning for an acute GI bleed. Mass found, concerning for malignancy; they are considering surgery; PMH is significant for recent TAVR in 02/2018 for severe aortic stenosis, Code duringthat admission (recovered) complete heart block s/p pacemaker placement in 02/2018, HFpEF, moderate mitral valve stenosis, hypothyroidism, hypertension, hyperlipidemia., CVA with residual r hemiparesis   OT comments  Pt progressing toward established goals. Pt currently requires minA for toilet transfer and toileting hygiene and required minA for functional mobility at RW level. VSS throughout session. Pt limited with participation this session stating she was "tired from PT session". Pt will continue to benefit from skilled OT to maximize safety and independence with ADL/IADL and functional mobility.    Follow Up Recommendations  Home health OT;Supervision/Assistance - 24 hour (with family support per PT)    Equipment Recommendations  None recommended by OT    Recommendations for Other Services      Precautions / Restrictions Precautions Precautions: Fall Precaution Comments: BP drop with incr time upright standing Restrictions Weight Bearing Restrictions: No       Mobility Bed Mobility Overal bed mobility: Needs Assistance Bed Mobility: Supine to Sit     Supine to sit: Mod assist     General bed mobility comments: modA to progress to upright   Transfers Overall transfer level: Needs assistance Equipment used: Rolling walker (2 wheeled) Transfers: Sit to/from Stand Sit to Stand: Min guard Stand pivot transfers: Min assist       General transfer comment: able to stand without  assistance today, stool incontinence upon standing     Balance Overall balance assessment: Needs assistance Sitting-balance support: Feet supported Sitting balance-Leahy Scale: Fair     Standing balance support: Bilateral upper extremity supported Standing balance-Leahy Scale: Fair                             ADL either performed or assessed with clinical judgement   ADL Overall ADL's : Needs assistance/impaired     Grooming: Wash/dry hands;Min guard                   Toilet Transfer: Minimal Production assistant, radio Details (indicate cue type and reason): cueing for safe hand placemnt Toileting- Clothing Manipulation and Hygiene: Minimal assistance;Sit to/from stand Toileting - Clothing Manipulation Details (indicate cue type and reason): pt completed pericare while standing     Functional mobility during ADLs: Minimal assistance;Cueing for safety;Rolling walker       Vision       Perception     Praxis      Cognition Arousal/Alertness: Awake/alert Behavior During Therapy: WFL for tasks assessed/performed Overall Cognitive Status: No family/caregiver present to determine baseline cognitive functioning Area of Impairment: Safety/judgement                         Safety/Judgement: Decreased awareness of safety;Decreased awareness of deficits     General Comments: required consistent vc for safe hand placement;        Exercises     Shoulder Instructions       General Comments called son while with patient and discussed safety considerationd and rec guarding, use or RW  over rollator, level of assistance needed for home. son agreeable.     Pertinent Vitals/ Pain       Pain Assessment: No/denies pain Pain Score: 0-No pain  Home Living                                          Prior Functioning/Environment              Frequency  Min 2X/week        Progress Toward Goals  OT  Goals(current goals can now be found in the care plan section)  Progress towards OT goals: Progressing toward goals  Acute Rehab OT Goals Patient Stated Goal: to get home well OT Goal Formulation: With patient Time For Goal Achievement: 06/06/18 Potential to Achieve Goals: Good ADL Goals Pt Will Perform Grooming: with set-up;standing Pt Will Perform Upper Body Bathing: with set-up;sitting Pt Will Perform Lower Body Bathing: with set-up;sitting/lateral leans;sit to/from stand Pt Will Transfer to Toilet: with supervision;stand pivot transfer;ambulating;bedside commode;regular height toilet Pt Will Perform Toileting - Clothing Manipulation and hygiene: with modified independence;sitting/lateral leans;sit to/from stand Pt/caregiver will Perform Home Exercise Program: Both right and left upper extremity;With written HEP provided  Plan Discharge plan remains appropriate    Co-evaluation                 AM-PAC OT "6 Clicks" Daily Activity     Outcome Measure   Help from another person eating meals?: None Help from another person taking care of personal grooming?: A Little Help from another person toileting, which includes using toliet, bedpan, or urinal?: A Little Help from another person bathing (including washing, rinsing, drying)?: A Little Help from another person to put on and taking off regular upper body clothing?: A Little Help from another person to put on and taking off regular lower body clothing?: A Lot 6 Click Score: 18    End of Session Equipment Utilized During Treatment: Gait belt;Rolling walker  OT Visit Diagnosis: Muscle weakness (generalized) (M62.81)   Activity Tolerance Patient tolerated treatment well   Patient Left in chair;with call bell/phone within reach;with chair alarm set   Nurse Communication Mobility status        Time: 1431-1443 OT Time Calculation (min): 12 min  Charges: OT General Charges $OT Visit: 1 Visit OT Treatments $Self  Care/Home Management : 8-22 mins  Dorinda Hill OTR/L Acute Rehabilitation Services Office: [place] 05/27/2018, 2:58 PM

## 2018-05-27 NOTE — Progress Notes (Signed)
Pt. discharge order was discontinued. Family/son unavailable to take care/ watched over her tonight.

## 2018-05-27 NOTE — Discharge Summary (Signed)
[facility] Discharge Summary  [name]           Medical record number: [id number] Date of birth: May 01, 1933        Age: 85 y.o.    Gender: female Date of Admission: 05/17/2018                        Date of Discharge: 05/28/18  Admitting Physician: Julie Kay, MD  Primary Care Provider: Lyman Bishop, DO Consultants: GI, cardiology, general surgery  Indication for Hospitalization: Symptomatic anemia  Discharge Diagnoses/Problem List:  Anemia, in setting of a sending colonic adenocarcinoma, ss/p hemicolectomy Aortic severe stenosis s/p TAVR in 02/2018 Chronic HFpEF with EF 60-65% Complete heart block, s/p pacemaker placement Previous CVA Hypothyroidism Hypertension Hyperlipidemia  Disposition: Home with home health  Discharge Condition: Stable  Discharge Exam:  General: Pleasant lady with neurocognitive decline sitting in bedside chair Cardiac: 4/6 systolic murmur in aortic area, regular rhythm, normal rate Respiratory: Comfortable work of breathing, no rhonchi, crackles Abdomen: Incision sites clean, dry, and intact, nontender to palpation Extremities: No pedal edema, warm and well perfused   Brief Hospital Course:  [name] is an 84 year old female, with a history of recent TAVR in 02/2018 for severe aortic stenosis on aspirin and Plavix, who presented with lightheadedness and fatigue, found to be anemic to 5.8 on arrival.  Aspirin and Plavix held.  Received 2 U pRBCs with improvement to hgb 7.5.  GI was consulted to evaluate for GIB, performed an EGD and colonoscopy on 5/8, which revealed a frond-like/villous, nonobstructing ascending colon mass with oozing suspicious for malignancy.  Pathology revealed invasive adenocarcinoma and the patient was scheduled for a right hemicolectomy.  Hemicolectomy was performed on 05/24/2018.  Pathology report can be seen below.  After surgery, her diet was advanced.  PT  recommended home with home health and 24-hour supervision which family will provide for at least the first week.  Prior to discharge patient's hemoglobin remained stable at 7.1 while on her home DAPT.  CT scan revealed a mass on the patient's breast suspicious for cancer. Oncology was curbsided while admitted and does not believe it was a metastasis of the adenocarcinoma. IR was also consulted and agrees it is in patient's best interest to have breast mass worked up outpatient. She will need follow up for breast mass and oncology.   Issues for Follow Up:  1. Malignancy - the patient will need further workup of her adenocarcinoma.  Ensure patient goes to f/u appt w/ general surgery, and has follow-up with cancer center. 2. Anemia - would need cbc at [place] up and further workup of anemia if necessary.  Patient given IV iron prior to discharge and was told to take home iron every other day. 3. Patient require work-up of possible breast cancer given mass seen on right breast.  She will need mammogram with biopsy and possible referral. 4. Patient with some perirectal skin breakdown during admission which caused some discomfort.  Ensure that this has improved with use of barrier cream.  Significant Procedures:  Surgical pathology from hemicolectomy 05/24/2018: 1. Colon, segmental resection for tumor, right Ascending colon and terminal ileum - INVASIVE ADENOCARCINOMA, MODERATELY DIFFERENTIATED, SPANNING 2.5 CM. - CARCINOMA ARISES FROM A TUBULAR ADENOMA. - TUMOR INVADES INTO SUBMUCOSA. - RESECTION MARGINS ARE NEGATIVE. - TUBULAR ADENOMA (X1). - EIGHTEEN OF EIGHTEEN LYMPH NODES NEGATIVE FOR CARCINOMA (0/18). - SEE ONCOLOGY TABLE. 2. Small intestine, resection, ileum  additional margin - BENIGN SMALL BOWEL MUCOSA. - NO DYSPLASIA OR MALIGNANCY.  Biopsy pathology from biopsy on 05/19/2018: 1. Duodenum, Biopsy - BENIGN SMALL BOWEL MUCOSA. - NO VILLOUS BLUNTING OR INCREASE IN INTRAEPITHELIAL  LYMPHOCYTES. - NO DYSPLASIA OR MALIGNANCY. 2. Stomach, polyp(s) - FUNDIC GLAND POLYP. - WARTHIN-STARRY IS NEGATIVE FOR HELICOBACTER PYLORI. - NO INTESTINAL METAPLASIA, DYSPLASIA, OR MALIGNANCY. 3. Colon, biopsy, ascending - INVASIVE ADENOCARCINOMA.  Colonoscopy Impression:  - Likely malignant tumor in the ascending colon. Biopsied. Tattooed. This is the source of the anemia. - A few 2 to 5 mm polyps in the rectum, in the sigmoid colon and in the ascending colon. - Non-bleeding internal hemorrhoids. - The examined portion of the ileum was normal.  EGD Impression:   - 3 cm hiatal hernia. - Normal upper third of esophagus and middle third of esophagus. - A single gastric polyp. Biopsied. - Normal gastric fundus, incisura, antrum and pylorus. - Normal duodenal bulb, first portion of the duodenum and second portion of the duodenum.  Significant Labs and Imaging:  Recent Labs  Lab 05/25/18 0440 05/26/18 0357 05/27/18 0528  WBC 14.6* 11.4* 9.2  HGB 7.9* 7.3* 7.1*  HCT 27.1* 25.3* 24.1*  PLT 339 306 276   Recent Labs  Lab 05/22/18 0654 05/23/18 0319 05/25/18 0440 05/26/18 0357  NA 138 138 137 136  K 4.7 4.3 4.4 4.3  CL 106 102 105 103  CO2 25 24 23 26   GLUCOSE 95 91 117* 100*  BUN 7* 6* 10 10  CREATININE 0.86 0.81 0.87 0.86  CALCIUM 8.8* 8.7* 9.0 8.7*   Results/Tests Pending at Time of Discharge: none  Discharge Medications:  Allergies as of 05/28/2018      Reactions   Adhesive [tape] Other (See Comments)   Causes BLISTERS      Medication List    TAKE these medications   amoxicillin 500 MG tablet Commonly known as:  AMOXIL Please take 2,000 mg (4 tablets) one hour prior to all dental procedures.   aspirin EC 81 MG tablet Take 1 tablet (81 mg total) by mouth daily.   CALTRATE 600+D3 PO Take 1 tablet by mouth 2 (two) times daily.   clopidogrel 75 MG tablet Commonly known as:  PLAVIX Take 1 tablet (75 mg total) by mouth daily with breakfast.    diphenhydrAMINE 25 MG tablet Commonly known as:  BENADRYL Take 25 mg by mouth every 6 (six) hours as needed.   ferrous sulfate 325 (65 FE) MG tablet Take 1 tablet (325 mg total) by mouth every other day. What changed:  when to take this   furosemide 20 MG tablet Commonly known as:  Lasix Take 1 tablet (20 mg total) by mouth daily. What changed:    when to take this  reasons to take this   levothyroxine 100 MCG tablet Commonly known as:  SYNTHROID Take 100 mcg by mouth daily before breakfast.   Melatonin 5 MG Caps Take 5 mg by mouth at bedtime as needed (sleep).   metoprolol tartrate 50 MG tablet Commonly known as:  LOPRESSOR Take 50 mg by mouth 2 (two) times daily.   Multivitamin Adults 50+ Tabs Take 1 tablet by mouth daily.   oxyCODONE 5 MG immediate release tablet Commonly known as:  Oxy IR/ROXICODONE Take 1 tablet (5 mg total) by mouth every 4 (four) hours as needed for moderate pain or severe pain.   pantoprazole 40 MG tablet Commonly known as:  PROTONIX Take 1 tablet (40 mg total) by mouth  daily.   saccharomyces boulardii 250 MG capsule Commonly known as:  FLORASTOR Take 1 capsule (250 mg total) by mouth 2 (two) times daily.   simvastatin 80 MG tablet Commonly known as:  ZOCOR Take 80 mg by mouth every evening.            Durable Medical Equipment  (From admission, onward)         Start     Ordered   05/27/18 1412  For home use only DME 3 n 1  Once     05/27/18 1413   05/26/18 1553  For home use only DME Bedside commode  Once    Question:  Patient needs a bedside commode to treat with the following condition  Answer:  Frailty   05/26/18 1553          Discharge Instructions: Please refer to Patient Instructions section of EMR for full details.  Patient was counseled important signs and symptoms that should prompt return to medical care, changes in medications, dietary instructions, activity restrictions, and follow up appointments.    Follow-Up Appointments: Follow-up Information    Ileana Roup, MD. Go on 06/13/2018.   Specialty:  General Surgery Why:  Your appointment is 6/2 at 10am. Please arrive 30 minutes prior to your appointment to check in and fill out paperwork. Bring insurance information and photo ID. Contact information: [place] [address] [phone]           Kathrene Alu, MD 05/28/2018, 9:59 AM PGY-2, [place]

## 2018-05-27 NOTE — Progress Notes (Signed)
Physical Therapy Treatment Patient Details Name: Julie Mcgee MRN: [id number] DOB: 1933/03/17 Today's Date: 05/27/2018    History of Present Illness Julie Mcgee is a 85 y.o. female presenting with a 1-2-week history of darker stools and lightheadedness, found to have a hemoglobin of 5.8 on arrival, concerning for an acute GI bleed. Mass found, concerning for malignancy; they are considering surgery; PMH is significant for recent TAVR in 02/2018 for severe aortic stenosis, Code duringthat admission (recovered) complete heart block s/p pacemaker placement in 02/2018, HFpEF, moderate mitral valve stenosis, hypothyroidism, hypertension, hyperlipidemia., CVA with residual r hemiparesis    PT Comments    Patient continue to improve, increasing ambulation distance and level of assistance needed today, now S to Contact guard during  OOB mobility. Discussed patients current level with her son on telephone during visit, and various safety considerations, son is agreeable and will be arranging more support for patient at home.     Follow Up Recommendations  Home health PT;Supervision/Assistance - 24 hour (with family support per CM)    Equipment Recommendations  None recommended by PT(seems pretty well-equipped)    Recommendations for Other Services OT consult(for ADLs, and input in dc decision-making)     Precautions / Restrictions Precautions Precautions: Fall Precaution Comments: BP drop with incr time upright standing    Mobility  Bed Mobility Overal bed mobility: Needs Assistance Bed Mobility: Supine to Sit     Supine to sit: Supervision     General bed mobility comments: Supervision for safety  Transfers Overall transfer level: Needs assistance Equipment used: Rolling walker (2 wheeled) Transfers: Sit to/from Stand Sit to Stand: Min guard         General transfer comment: able to stand without assistance today, stool incontinence upon standing    Ambulation/Gait Ambulation/Gait assistance: Min guard(with and without physical contact) Gait Distance (Feet): 100 Feet Assistive device: Rolling walker (2 wheeled) Gait Pattern/deviations: Step-through pattern Gait velocity: decreased   General Gait Details: further distance, increased stability. now supevision level with some LOB noted. no dizziness.    Stairs             Wheelchair Mobility    Modified Rankin (Stroke Patients Only)       Balance Overall balance assessment: Needs assistance Sitting-balance support: Feet supported Sitting balance-Leahy Scale: Fair     Standing balance support: Bilateral upper extremity supported Standing balance-Leahy Scale: Fair(decr standing tolerance)                              Cognition Arousal/Alertness: Awake/alert Behavior During Therapy: WFL for tasks assessed/performed Overall Cognitive Status: Impaired/Different from baseline Area of Impairment: Safety/judgement                         Safety/Judgement: Decreased awareness of safety;Decreased awareness of deficits     General Comments: Took extra time to talk about safety concerns with BP drop with standing/walking 3-5 minutes; After the talk, she asked if she can walk the hallways later -- I emphasized that she MUST have staff near and recliner pulle dbehind      Exercises      General Comments General comments (skin integrity, edema, etc.): called son while with patient and discussed safety considerationd and rec guarding, use or RW over rollator, level of assistance needed for home. son agreeable.       Pertinent Vitals/Pain      Home Living  Prior Function            PT Goals (current goals can now be found in the care plan section) Acute Rehab PT Goals Patient Stated Goal: to get home well PT Goal Formulation: With patient Time For Goal Achievement: 06/04/18 Potential to Achieve Goals:  Good Progress towards PT goals: Progressing toward goals    Frequency    Min 3X/week      PT Plan     Co-evaluation              AM-PAC PT "6 Clicks" Mobility   Outcome Measure  Help needed turning from your back to your side while in a flat bed without using bedrails?: None Help needed moving from lying on your back to sitting on the side of a flat bed without using bedrails?: A Little Help needed moving to and from a bed to a chair (including a wheelchair)?: A Little Help needed standing up from a chair using your arms (e.g., wheelchair or bedside chair)?: A Little Help needed to walk in hospital room?: A Little Help needed climbing 3-5 steps with a railing? : A Lot 6 Click Score: 18    End of Session Equipment Utilized During Treatment: Gait belt Activity Tolerance: Other (comment)(limited by orthostatic hypotension) Patient left: in chair;with call bell/phone within reach;with chair alarm set Nurse Communication: Mobility status;Other (comment)(notified NT that she will needto get [facility]) PT Visit Diagnosis: Unsteadiness on feet (R26.81);Other abnormalities of gait and mobility (R26.89)     Time: 1130-1200 PT Time Calculation (min) (ACUTE ONLY): 30 min  Charges:  $Gait Training: 8-22 mins $Self Care/Home Management: 8-22          Reinaldo Berber, PT, DPT Acute Rehabilitation Services Pager: [phone] Office: [place] 05/27/2018, 11:58 AM

## 2018-05-27 NOTE — Care Management (Addendum)
Notified Cassie w [facility] that patient will DC to home today. She states resumption of care will start tomorrow.  No other CM needs identified.

## 2018-05-27 NOTE — Progress Notes (Addendum)
Family Medicine Teaching Service Daily Progress Note Intern Pager: [phone]  [name] Medical record number: [id number] Date of birth: 10-Oct-1933 Age: 85 y.o. Gender: female  Primary Care Provider: Lyman Bishop, DO Consultants: GI, Cardiology, General Surgery Code Status: Full  Pt Overview and Major Events to Date:  5/6: Admitted for anemia 2/2 GI bleed 5/8: colonoscopy shows mass 5/13: hemicolectomy for resection of adenocarcinoma in ascending colon  Assessment and Plan: Julie Munrois a 85 y.o.femalepresentingsymptomatic anemia to 5.8, found to have invasive adenocarcinoma of the ascending colon, now s/p resection.Julie Mcgee PMH is significant forrecent TAVR in 02/2018 for severe aortic stenosis, complete heart block s/p pacemaker placement in 02/2018, HFpEF, moderate mitral valve stenosis, hypothyroidism, hypertension, hyperlipidemia.  Invasive adenocarcinoma of ascending colon s/p hemicolectomy on 5/13 Hgb:7.5>6.9>2U pRBC's>>7.1 on 5/16 after restarting DAPT. S/p  hemicolectomy on 5/13. Son is HCPOA and would like to be called when speaking with pt.[place], [phone].  Unable to reach patient's 1 over the phone this morning.  Hemoglobin is stable and surgery recommends patient safe for discharge. -Gen surg following, appreciate recs -Follow-up in 2 weeks with general surgery -PT/OT -Will get IV iron x1 prior to discharge  Right breast mass incidental pick up on CT.  2.6cm mass in right breast.  Will work up outpatient.   -Ensure outpatient work up completed.      Aorticseverestenosis s/p TAVRFebruary 2020,complicated by cardiac arrest:Stable.  TAVR functioning appropriately. -Follow-up cardiology recommendations, -continue DAPT  Chronic HFpEF with EF 60-65%:Stable. Patient appears euvolemic on exam.  -Cardiology on board, appreciate recommendations -Blood pressure control as below  Complete heart block, s/p pacemaker  placement:Chronic, stable. Pacemaker placed in 02/2018. HR in60s-70s  -Monitor telemetry -Continue home metoprolol tartrate 50mg  BID  Previous CVA: Chronic, stable. -Continue DAPT -Continue home statin  Hypothyroidism: Chronic, stable. TSH 3.3 in 02/2018.  -Continue home Synthroid125 mcg  Hypertension: Chronic, stable. Normotensive currently.  Having orthostatic hypotension when working with PT.  -Monitor BP -Continue home Lopressor 50 mg twice daily  Hyperlipidemia: Chronic, stable. Takes simvastatin 80 mg daily. -Continue statin, substitute with atorvastatin formulary  Perirectal skin breakdown:Acute, improving. - Barrier cream as needed - Continue to monitor  FEN/GI:Full liquid diet per GI, pantoprazole.  Prophylaxis: lovenox  Disposition: likely home with HHPT  Subjective:  Patient is excited to go home today.  States that she is feeling great.  She states she had a watery bowel movement yesterday and has had flatus since.  She is very eager to get home.  She states that her family will be able to take care of her and that she has home health.  Objective: Temp:  [98.4 F (36.9 C)-99.7 F (37.6 C)] 98.7 F (37.1 C) (05/16 0559) Pulse Rate:  [66-97] 66 (05/16 0559) Resp:  [16-18] 18 (05/16 0559) BP: (92-139)/(41-64) 139/50 (05/16 0559) SpO2:  [93 %-99 %] 97 % (05/16 0559) Physical Exam: General: NAD, sitting up in chair Cardiovascular: RRR, no M/R/G Respiratory: CTA B, normal work of breathing Abdomen: Incision without erythema or drainage Extremities: No lower extremity edema noted3  Laboratory: Recent Labs  Lab 05/25/18 0440 05/26/18 0357 05/27/18 0528  WBC 14.6* 11.4* 9.2  HGB 7.9* 7.3* 7.1*  HCT 27.1* 25.3* 24.1*  PLT 339 306 276   Recent Labs  Lab 05/23/18 0319 05/25/18 0440 05/26/18 0357  NA 138 137 136  K 4.3 4.4 4.3  CL 102 105 103  CO2 24 23 26   BUN 6* 10 10  CREATININE 0.81 0.87 0.86  CALCIUM  8.7* 9.0 8.7*  GLUCOSE 91  117* 100*    Imaging/Diagnostic Tests: No results found.  Lakin Romer, [place], DO 05/27/2018, 8:06 AM PGY-2, [place] Intern pager: [phone], text pages welcome

## 2018-05-28 DIAGNOSIS — N390 Urinary tract infection, site not specified: Secondary | ICD-10-CM | POA: Diagnosis not present

## 2018-05-28 DIAGNOSIS — I110 Hypertensive heart disease with heart failure: Secondary | ICD-10-CM | POA: Diagnosis not present

## 2018-05-28 DIAGNOSIS — I69322 Dysarthria following cerebral infarction: Secondary | ICD-10-CM | POA: Diagnosis not present

## 2018-05-28 DIAGNOSIS — I69331 Monoplegia of upper limb following cerebral infarction affecting right dominant side: Secondary | ICD-10-CM | POA: Diagnosis not present

## 2018-05-28 DIAGNOSIS — I350 Nonrheumatic aortic (valve) stenosis: Secondary | ICD-10-CM | POA: Diagnosis not present

## 2018-05-28 DIAGNOSIS — I5033 Acute on chronic diastolic (congestive) heart failure: Secondary | ICD-10-CM | POA: Diagnosis not present

## 2018-05-28 NOTE — Progress Notes (Signed)
Patient ID: Julie Mcgee, female   DOB: Jan 18, 1933, 85 y.o.   MRN: [id number]   Doing well Tolerating po Abdomen soft, NT  Plan: Going home today Follow-up with Dr. Dema Severin

## 2018-05-28 NOTE — Progress Notes (Signed)
DISCHARGE NOTE HOME Julie Mcgee to be discharged to home per MD order. Discussed prescriptions and follow up appointments with the patient. Prescriptions given to patient; medication list explained in detail. Patient verbalized understanding.Discharging M.D called her son and gave the latest update including some discharged instructions.  Skin clean, dry and intact without evidence of skin break down, no evidence of skin tears noted.Surgical sites at her abdomen are  Well approximated with skin glue,no signs of infection.IV catheter discontinued intact. Site without signs and symptoms of complications. Dressing and pressure applied. Pt denies pain at the site currently. No complaints noted.  Patient free of lines, drains, and wounds.   An After Visit Summary (AVS) was printed and given to the patient. Patient escorted via wheelchair, and discharged home via private auto.  [place], Zenon Mayo, RN

## 2018-05-29 DIAGNOSIS — I69331 Monoplegia of upper limb following cerebral infarction affecting right dominant side: Secondary | ICD-10-CM | POA: Diagnosis not present

## 2018-05-29 DIAGNOSIS — I350 Nonrheumatic aortic (valve) stenosis: Secondary | ICD-10-CM | POA: Diagnosis not present

## 2018-05-29 DIAGNOSIS — N390 Urinary tract infection, site not specified: Secondary | ICD-10-CM | POA: Diagnosis not present

## 2018-05-29 DIAGNOSIS — I110 Hypertensive heart disease with heart failure: Secondary | ICD-10-CM | POA: Diagnosis not present

## 2018-05-29 DIAGNOSIS — I5033 Acute on chronic diastolic (congestive) heart failure: Secondary | ICD-10-CM | POA: Diagnosis not present

## 2018-05-29 DIAGNOSIS — I69322 Dysarthria following cerebral infarction: Secondary | ICD-10-CM | POA: Diagnosis not present

## 2018-05-31 DIAGNOSIS — I350 Nonrheumatic aortic (valve) stenosis: Secondary | ICD-10-CM | POA: Diagnosis not present

## 2018-05-31 DIAGNOSIS — I69331 Monoplegia of upper limb following cerebral infarction affecting right dominant side: Secondary | ICD-10-CM | POA: Diagnosis not present

## 2018-05-31 DIAGNOSIS — I110 Hypertensive heart disease with heart failure: Secondary | ICD-10-CM | POA: Diagnosis not present

## 2018-05-31 DIAGNOSIS — I5033 Acute on chronic diastolic (congestive) heart failure: Secondary | ICD-10-CM | POA: Diagnosis not present

## 2018-05-31 DIAGNOSIS — N390 Urinary tract infection, site not specified: Secondary | ICD-10-CM | POA: Diagnosis not present

## 2018-05-31 DIAGNOSIS — I69322 Dysarthria following cerebral infarction: Secondary | ICD-10-CM | POA: Diagnosis not present

## 2018-06-02 DIAGNOSIS — I69322 Dysarthria following cerebral infarction: Secondary | ICD-10-CM | POA: Diagnosis not present

## 2018-06-02 DIAGNOSIS — I69331 Monoplegia of upper limb following cerebral infarction affecting right dominant side: Secondary | ICD-10-CM | POA: Diagnosis not present

## 2018-06-02 DIAGNOSIS — N390 Urinary tract infection, site not specified: Secondary | ICD-10-CM | POA: Diagnosis not present

## 2018-06-02 DIAGNOSIS — I110 Hypertensive heart disease with heart failure: Secondary | ICD-10-CM | POA: Diagnosis not present

## 2018-06-02 DIAGNOSIS — I5033 Acute on chronic diastolic (congestive) heart failure: Secondary | ICD-10-CM | POA: Diagnosis not present

## 2018-06-02 DIAGNOSIS — I350 Nonrheumatic aortic (valve) stenosis: Secondary | ICD-10-CM | POA: Diagnosis not present

## 2018-06-06 ENCOUNTER — Other Ambulatory Visit: Payer: Self-pay | Admitting: Hematology

## 2018-06-06 ENCOUNTER — Encounter: Payer: Medicare Other | Admitting: Internal Medicine

## 2018-06-06 ENCOUNTER — Other Ambulatory Visit (HOSPITAL_COMMUNITY): Payer: Medicare Other

## 2018-06-06 ENCOUNTER — Ambulatory Visit (INDEPENDENT_AMBULATORY_CARE_PROVIDER_SITE_OTHER): Payer: Medicare Other | Admitting: *Deleted

## 2018-06-06 DIAGNOSIS — I442 Atrioventricular block, complete: Secondary | ICD-10-CM | POA: Diagnosis not present

## 2018-06-06 DIAGNOSIS — I110 Hypertensive heart disease with heart failure: Secondary | ICD-10-CM | POA: Diagnosis not present

## 2018-06-06 DIAGNOSIS — I5033 Acute on chronic diastolic (congestive) heart failure: Secondary | ICD-10-CM | POA: Diagnosis not present

## 2018-06-06 DIAGNOSIS — N649 Disorder of breast, unspecified: Secondary | ICD-10-CM

## 2018-06-06 DIAGNOSIS — N6310 Unspecified lump in the right breast, unspecified quadrant: Secondary | ICD-10-CM

## 2018-06-06 DIAGNOSIS — N390 Urinary tract infection, site not specified: Secondary | ICD-10-CM | POA: Diagnosis not present

## 2018-06-06 DIAGNOSIS — I69331 Monoplegia of upper limb following cerebral infarction affecting right dominant side: Secondary | ICD-10-CM | POA: Diagnosis not present

## 2018-06-06 DIAGNOSIS — I69322 Dysarthria following cerebral infarction: Secondary | ICD-10-CM | POA: Diagnosis not present

## 2018-06-06 DIAGNOSIS — I350 Nonrheumatic aortic (valve) stenosis: Secondary | ICD-10-CM | POA: Diagnosis not present

## 2018-06-06 LAB — CUP PACEART REMOTE DEVICE CHECK
Battery Remaining Longevity: 128 mo
Battery Voltage: 3.17 V
Brady Statistic AP VP Percent: 4.55 %
Brady Statistic AP VS Percent: 0.01 %
Brady Statistic AS VP Percent: 94.97 %
Brady Statistic AS VS Percent: 0.47 %
Brady Statistic RA Percent Paced: 4.62 %
Brady Statistic RV Percent Paced: 99.53 %
Date Time Interrogation Session: 20200526171112
Implantable Lead Implant Date: 20200220
Implantable Lead Implant Date: 20200220
Implantable Lead Location: 753859
Implantable Lead Location: 753860
Implantable Lead Model: 3830
Implantable Lead Model: 5076
Implantable Pulse Generator Implant Date: 20200220
Lead Channel Impedance Value: 285 Ohm
Lead Channel Impedance Value: 323 Ohm
Lead Channel Impedance Value: 475 Ohm
Lead Channel Impedance Value: 589 Ohm
Lead Channel Pacing Threshold Amplitude: 0.625 V
Lead Channel Pacing Threshold Amplitude: 0.75 V
Lead Channel Pacing Threshold Pulse Width: 0.4 ms
Lead Channel Pacing Threshold Pulse Width: 0.4 ms
Lead Channel Sensing Intrinsic Amplitude: 22.25 mV
Lead Channel Sensing Intrinsic Amplitude: 22.25 mV
Lead Channel Sensing Intrinsic Amplitude: 3.75 mV
Lead Channel Sensing Intrinsic Amplitude: 3.75 mV
Lead Channel Setting Pacing Amplitude: 2.75 V
Lead Channel Setting Pacing Amplitude: 2.75 V
Lead Channel Setting Pacing Pulse Width: 0.4 ms
Lead Channel Setting Sensing Sensitivity: 2.8 mV

## 2018-06-07 ENCOUNTER — Encounter: Payer: Self-pay | Admitting: *Deleted

## 2018-06-07 ENCOUNTER — Telehealth: Payer: Self-pay

## 2018-06-07 DIAGNOSIS — I350 Nonrheumatic aortic (valve) stenosis: Secondary | ICD-10-CM | POA: Diagnosis not present

## 2018-06-07 DIAGNOSIS — N390 Urinary tract infection, site not specified: Secondary | ICD-10-CM | POA: Diagnosis not present

## 2018-06-07 DIAGNOSIS — I5033 Acute on chronic diastolic (congestive) heart failure: Secondary | ICD-10-CM | POA: Diagnosis not present

## 2018-06-07 DIAGNOSIS — I110 Hypertensive heart disease with heart failure: Secondary | ICD-10-CM | POA: Diagnosis not present

## 2018-06-07 DIAGNOSIS — I69331 Monoplegia of upper limb following cerebral infarction affecting right dominant side: Secondary | ICD-10-CM | POA: Diagnosis not present

## 2018-06-07 DIAGNOSIS — N649 Disorder of breast, unspecified: Secondary | ICD-10-CM | POA: Insufficient documentation

## 2018-06-07 DIAGNOSIS — I69322 Dysarthria following cerebral infarction: Secondary | ICD-10-CM | POA: Diagnosis not present

## 2018-06-07 DIAGNOSIS — K922 Gastrointestinal hemorrhage, unspecified: Secondary | ICD-10-CM | POA: Diagnosis not present

## 2018-06-07 DIAGNOSIS — D62 Acute posthemorrhagic anemia: Secondary | ICD-10-CM | POA: Diagnosis not present

## 2018-06-07 NOTE — Progress Notes (Signed)
Received request to see patient in our office on 06/06/18. Patient has a colon cancer which will likely only require surviellance, however during work up a breast mass was found which requires further workup.   Reached out to [employer] son, [name], to introduce myself as the office RN Navigator and explain our new patient process. Reviewed the reason for their referral and scheduled their new patient appointment. Patient appointment will be a virtual visit over Webex. Emailed son with address and directions to the office including call back phone number so that they would have these available to them for future visits. Reviewed with patient any concerns they may have or any possible barriers to attending their appointment.   Patient is having labs drawn today at her [place]. [facility] will be drawing blood. Spoke to [employer] at [facility] at [phone] and requested a CBC, CMP, IRON panel (Iron, TIBC, ferritin) and fax [place] results. They will send a nurse today for draw.  Also emailed son My Chart link for activation and encouraged him to sign up for this service.  Dr Maylon Peppers placed order for diagnostic mammogram with [place]. Spoke with [facility] and they will reach out to patient's son to schedule appointment.   Will follow up after her appointment tomorrow for further needs.

## 2018-06-07 NOTE — Telephone Encounter (Signed)
Called and spoke with patient's son,to clarify that patient does not have a WebEx appointment with Dr. Burr Medico this week but will be scheduled with Dr. Maylon Peppers on 6.28. Inocencio Homes RN will be scheduling appointment. Son was appreciative for clarification.

## 2018-06-07 NOTE — Progress Notes (Signed)
[place]  HEMATOLOGY-ONCOLOGY TeleHEALTH VISIT PROGRESS NOTE   I connected with [name] on 06/08/18 at  1:30 PM EDT by video enabled telemedicine visit and verified that I am speaking with the correct person using two identifiers.   I discussed the limitations, risks, security and privacy concerns of performing an evaluation and management service by telemedicine and the availability of in-person appointments. I also discussed with the patient that there may be a patient responsible charge related to this service. The patient expressed understanding and agreed to proceed.   Other persons participating in the visit and their role in the encounter: patient's daughter    Patient's location: assisted living facility   Provider's location: clinic   Patient Care Team: Masneri, Adele Barthel, DO as PCP - General (Family Medicine) Revankar, Reita Cliche, MD as PCP - Cardiology (Cardiology) Sherren Mocha, MD as PCP - Structural Heart (Cardiology) Evans Lance, MD as PCP - Electrophysiology (Cardiology) Rosalin Hawking, MD as Consulting Physician (Neurology) Cordelia Poche, RN as Oncology Nurse Navigator Tish Men, MD as Medical Oncologist (Oncology)  HEME/ONC OVERVIEW: 1. 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 Incidental R breast mass -05/2018: staging scans showed a 2.6cm lobulated mass in the R central breast, as well as enlarged mediastinal and hilar LN's (largest pre-tracheal LN 1.9cm), stable since 02/2018  TREATMENT REGIMEN:  05/24/2018: right hemicolectomy and ileal resection   PERTINENT NON-HEM/ONC PROBLEMS: 1. Severe aortic stenosis s/p TAVR in 02/2018  -Hospitalization complicated by  complete heart block and brief asystolic requiring CPR and inotrope; permanent pacemaker placed during the hospitalization   2. Chronic HFpEF  3. Hx of CVA with residual R hemiparesis   ASSESSMENT & PLAN:   Incidental R breast mass -I reviewed the patient's records in detail, including recent hospitalization notes, lab studies, imaging results, the pathology reports -In summary, patient presented to [facility] ER in 05/2018 for symptomatic anemia and was found with Hgb of 5.8.  Patient received blood transfusion, and underwent colonoscopy and EGD, which showed a suspicious mass in the right ascending colon that was biopsy-proven adenocarcinoma.  She subsequently underwent right hemicolectomy, which showed Stage I invasive adenocarcinoma of the right colon.  Incidentally, staging scans showed 2.6cm lobulated mass in the right central breast, as well as enlarged mediastinal and hilar LN's (largest pretracheal LN 1.9cm), which was stable compared with 02/2018, but there was no earlier scan for comparison. -I discussed the imaging results in detail with the patient -Given the presence of microsatellite stability from the colon cancer bx (see discussion below), the may represent a primary breast malignancy -To expedite the work-up, I have ordered diagnostic bilateral MMG and right axillary [place] -If bx of the breast lesion shows breast cancer, then PET scan would be helpful to assess the mediastinal and hilar LN's, including bronchoscopy w/ EBUS for bx to rule out metastatic disease vs. other etiologies, such as sarcoidosis  -After lengthy discussions, patient indicated that she was not ready yet to pursue any additional work-up, and we have reached out to [facility] to reschedule the patient's appts (currently scheduled on 06/12/2018) -I offered her to schedule a follow-up appt to re-assess her decision, but the patient declined and would call us back and let us know her decision -I have asked  the  oncology navigator to follow up with the patient in a week and see what her decision is   Stage I adenocarcinoma of the ascending colon  -I reviewed the pathology results in detail with the patient, as well as the NCCN guideline -Given the early stage colon cancer, there is no indication for adjuvant chemotherapy or radiation  -She will require surveillance colonoscopy, generally 1 year after surgery and based on the findings then, the frequency of colonoscopy can be determined -I encourage the patient to follow up with her gastroenterologist for appropriate colon cancer screening  Normocytic anemia -Likely multifactorial, including iron deficiency anemia from chronic blood loss due to colon cancer and acute blood loss from surgery -Patient was given 2 units RBC transfusion on admission in 05/2018; Hgb 7.1 on the day of discharge -Repeat CBC on 06/07/2018 at the patient's assisted living showed Hgb 9.3 (verbal report); iron profile consistent with iron deficiency (ferritin not done) -We discussed some of the risks, benefits, and alternatives of intravenous iron infusions, but the patient declined -Continue oral iron supplement q2days   Microsatellite instability  -Pathology testing of the colon cancer showed microsatellite instability and the presence of BRAF V600E mutation -While the presence of MSI-H suggests possible Lynch syndrome, the concurrent presence of BRAF V600E mutation suggests that this may be due to MLH1 gene promoter hypermethylation (somatic mutation) instead of germline mutation -With that said, in light of the oresence of a concurrent suspicious breast lesion, the patient would benefit from genetic evaluation -Therefore, I have placed referral to genetics   Orders Placed This Encounter  Procedures  . Ambulatory referral to Genetics    Referral Priority:   Routine    Referral Type:   Consultation    Referral Reason:   Specialty Services Required    Number of Visits  Requested:   1    I discussed the assessment and treatment plan with the patient. The patient was provided an opportunity to ask questions and all were answered. The patient agreed with the plan and demonstrated an understanding of the instructions.   The patient was advised to call back or seek an in-person evaluation if the symptoms worsen or if the condition fails to improve as anticipated.   Return to be determined, pending the patient's decision regarding further breast mass work-up.   Tish Men, MD 06/08/2018 1:30 PM   CHIEF COMPLAINTS/PURPOSE OF CONSULTATION:  "I am doing fine"  HISTORY OF PRESENTING ILLNESS:  Julie Mcgee is 85 y.o. female with multiple comorbidities, including history of severe aortic stenosis s/p TAVR in 34/1962, complicated by complete heart block and transient asystole requiring CPR and permanent pacemaker placement, history of CVA with residual right hemiparesis, and chronic HFpEF, who is here because of newly diagnosed Stage I adenocarcinoma of the right colon and incidentally noted right breast mass.  Patient has had two lengthy and complicated hospitalizations in 2020.  She was admitted in 02/2018 for severe aortic stenosis and underwent TAVR.  Her hospitalization was complicated by complete heart block and asystole requiring brief CPR and inotropes, as well as permanent pacemaker placement.  She was discharged to her nursing home after the hospitalization, but was readmitted in 05/2018 for lightheadedness and fatigue, and was found with Hgb of 5.8 on admission.  She received 2 units of RBCs, and underwent EGD and colonoscopy, the latter which showed a non-obstructing mass in the ascending colon, the biopsy which positive for adenocarcinoma.  She underwent right hemicolectomy and ileal resection, on  the final pathology showed Stage I adenocarcinoma.  Incidentally, staging scans showed a right breast mass, and patient was referred to oncology for outpatient  evaluation.  Patient reports that since discharge, she has had some mild swelling in the bilateral ankles.  Her wound is healing well, she denies any abdominal pain, nausea, vomiting, diarrhea, hematochezia, melena.  She lives at an assisted living facility with her husband.  I discussed with CT chest results in detail with the patient and her daughter Gregary Signs, and the patient mentioned that she had known about the right breast mass at least 10 years ago when she lived in the [place] and was instructed to "have it looked at", but she has not been interested in pursuing any further work-up.  She denies any breast pain or palpable breast mass.  She denies any other complaint at this time.  I have reviewed her chart and materials related to her cancer extensively and collaborated history with the patient. Summary of oncologic history is as follows:   Malignant neoplasm of ascending colon [facility])    Initial Diagnosis    Malignant neoplasm of ascending colon ([place])    05/19/2018 Procedure    Colonoscopy: - Likely malignant tumor in the ascending colon. Biopsied. Tattooed. This is the source of the anemia. - A few 2 to 5 mm polyps in the rectum, in the sigmoid colon and in the ascending colon. - Non-bleeding internal hemorrhoids. - The examined portion of the ileum was normal.    05/19/2018 Pathology Results    1. Duodenum, Biopsy - BENIGN SMALL BOWEL MUCOSA. - NO VILLOUS BLUNTING OR INCREASE IN INTRAEPITHELIAL LYMPHOCYTES. - NO DYSPLASIA OR MALIGNANCY. 2. Stomach, polyp(s) - FUNDIC GLAND POLYP. - WARTHIN-STARRY IS NEGATIVE FOR HELICOBACTER PYLORI. - NO INTESTINAL METAPLASIA, DYSPLASIA, OR MALIGNANCY. 3. Colon, biopsy, ascending - INVASIVE ADENOCARCINOMA.    05/20/2018 Imaging    CT CAP: IMPRESSION: 1. No CT findings of the abdomen or pelvis to localize suspected GI bleeding. No intraluminal contrast noted.  2. Focal soft tissue thickening of the colon wall superior to the ileocecal valve may  reflect reported ascending colon mass.  3. There is extensive fatty mural stratification of the cecum, suggesting chronic inflammatory sequelae of colitis, including Crohn's disease.  4. Moderate right, small left pleural effusions and associated atelectasis or consolidation. There is mild, diffuse interlobular septal thickening, most consistent with edema.  5.  Interval placement of aortic valve stent endograft.  6. There is a lobulated 2.6 cm mass in the central right breast (series 3, image 39), as seen on prior examination. Correlate with mammography.  7.  Other chronic and incidental findings as detailed above.    05/24/2018 Pathology Results    Accession: [id number]  Procedure: Right colon and ileum resection. Tumor Site: Proximal right colon. Tumor Size: 2.5 cm. Macroscopic Tumor Perforation: Not identified. Histologic Type: Invasive adenocarcinoma. Histologic Grade: G2, moderately differentiated. Tumor Extension: Into submucosa. Margins: Negative. Treatment Effect: N/A. Lymphovascular Invasion: N/A. Perineural Invasion: N/A. Tumor Deposits: Not identified. Regional Lymph Nodes: Number of Lymph Nodes Involved: 0 Number of Lymph Nodes Examined: 18 Pathologic Stage Classification (pTNM, AJCC 8th Edition): pT1, pN0 Ancillary Studies: MSI will be ordered. MMR was performed on the biopsy and showed loss of MLH1 and PMS2.  MSI-high, BRAF V600E mutation present     05/24/2018 Cancer Staging    Staging form: Colon and Rectum, AJCC 8th Edition - Pathologic stage from 05/24/2018: Stage I (pT1, pN0, cM0) - Signed by Tish Men, MD on 06/07/2018  MEDICAL HISTORY:  Past Medical History:  Diagnosis Date  . Arthritis   . Carotid artery stenosis   . Chronic diastolic CHF (congestive heart failure) ([place])   . Former smoker    1/2 ppd for 45 years  . 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 Hyperlipidemia   . Hypertension   .  Hypothyroidism   . Peripheral vascular disease ([place])   . RBBB   . 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 Severe aortic stenosis     SURGICAL HISTORY: Past Surgical History:  Procedure Laterality Date  . BASAL CELL CARCINOMA EXCISION  2018  . BIOPSY  05/19/2018   Procedure: BIOPSY;  Surgeon: Lavena Bullion, DO;  Location: [facility];  Service: Gastroenterology;;  . CARDIAC CATHETERIZATION    . COLONOSCOPY  2001  . COLONOSCOPY WITH PROPOFOL N/A 05/19/2018   Procedure: COLONOSCOPY WITH PROPOFOL;  Surgeon: Lavena Bullion, DO;  Location: [facility] [place];  Service: Gastroenterology;  Laterality: N/A;  . ESOPHAGOGASTRODUODENOSCOPY (EGD) WITH PROPOFOL N/A 05/19/2018   Procedure: ESOPHAGOGASTRODUODENOSCOPY (EGD) WITH PROPOFOL;  Surgeon: Lavena Bullion, DO;  Location: [facility];  Service: Gastroenterology;  Laterality: N/A;  . HIP SURGERY  2005  . KNEE SURGERY  1998  . LAPAROSCOPIC PARTIAL COLECTOMY N/A 05/24/2018   Procedure: LAPAROSCOPIC ASSISTED ASCENDING HEMICOLECTOMY;  Surgeon: Ileana Roup, MD;  Location: [facility];  Service: General;  Laterality: N/A;  . PACEMAKER IMPLANT N/A 03/02/2018   Procedure: PACEMAKER IMPLANT;  Surgeon: Evans Lance, MD;  Location: [facility];  Service: Cardiovascular;  Laterality: N/A;  . RIGHT/LEFT HEART CATH AND CORONARY ANGIOGRAPHY N/A 02/21/2018   Procedure: RIGHT/LEFT HEART CATH AND CORONARY ANGIOGRAPHY;  Surgeon: Sherren Mocha, MD;  Location: [facility];  Service: Cardiovascular;  Laterality: N/A;  . SUBMUCOSAL TATTOO INJECTION  05/19/2018   Procedure: SUBMUCOSAL TATTOO INJECTION;  Surgeon: Lavena Bullion, DO;  Location: [facility];  Service: Gastroenterology;;  . TEE WITHOUT CARDIOVERSION N/A 02/28/2018   Procedure: TRANSESOPHAGEAL ECHOCARDIOGRAM (TEE);  Surgeon: Sherren Mocha, MD;  Location: [facility];  Service: Open Heart Surgery;  Laterality: N/A;  . TEMPORARY PACEMAKER N/A 03/01/2018   Procedure: TEMPORARY PACEMAKER;  Surgeon: Burnell Blanks, MD;  Location: [facility];  Service: Cardiovascular;  Laterality: N/A;  . TONSILLECTOMY    . TRANSCATHETER AORTIC VALVE REPLACEMENT, TRANSFEMORAL N/A 02/28/2018   Procedure: TRANSCATHETER AORTIC VALVE REPLACEMENT, TRANSFEMORAL;  Surgeon: Sherren Mocha, MD;  Location: [facility];  Service: Open Heart Surgery;  Laterality: N/A;    SOCIAL HISTORY: Social History   Socioeconomic History  . Marital status: Married    Spouse name: Not on file  . Number of children: Not on file  . Years of education: Not on file  . Highest education level: Not on file  Occupational History  . Not on file  Social Needs  . Financial resource strain: Not on file  . Food insecurity:    Worry: Not on file    Inability: Not on file  . Transportation needs:    Medical: Not on file    Non-medical: Not on file  Tobacco Use  . Smoking status: Former Smoker    Packs/day: 1.00    Years: 45.00    Pack years: 45.00    Types: Cigarettes    Last attempt to quit:  05/23/2009    Years since quitting: 9.0  . Smokeless tobacco: Never Used  Substance and Sexual Activity  . Alcohol use: Not Currently  . Drug use: Never  . Sexual activity: Not on file  Lifestyle  . Physical activity:    Days per week: Not on file    Minutes per session: Not on file  . Stress: Not on file  Relationships  . Social connections:    Talks on phone: Not on file    Gets together: Not on file    Attends religious service: Not on file    Active member of club or organization: Not on file    Attends meetings of clubs or organizations: Not on file    Relationship status: Not on file  . Intimate partner violence:    Fear of current or ex partner: Not on file    Emotionally abused: Not on file    Physically abused: Not on file    Forced sexual activity:  Not on file  Other Topics Concern  . Not on file  Social History Narrative  . Not on file    FAMILY HISTORY: Family History  Problem Relation Age of Onset  . Heart failure Mother   . Chronic Renal Failure Mother   . Alcohol abuse Father     ALLERGIES:  is allergic to adhesive [tape].  MEDICATIONS:  Current Outpatient Medications  Medication Sig Dispense Refill  . amoxicillin (AMOXIL) 500 MG tablet Please take 2,000 mg (4 tablets) one hour prior to all dental procedures. 8 tablet 8  . aspirin EC 81 MG tablet Take 1 tablet (81 mg total) by mouth daily. (Patient not taking: Reported on 05/18/2018)    . Calcium Carb-Cholecalciferol (CALTRATE 600+D3 PO) Take 1 tablet by mouth 2 (two) times daily.    . clopidogrel (PLAVIX) 75 MG tablet Take 1 tablet (75 mg total) by mouth daily with breakfast. 90 tablet 1  . diphenhydrAMINE (BENADRYL) 25 MG tablet Take 25 mg by mouth every 6 (six) hours as needed.    . ferrous sulfate 325 (65 FE) MG tablet Take 1 tablet (325 mg total) by mouth every other day. 15 tablet 0  . furosemide (LASIX) 20 MG tablet Take 1 tablet (20 mg total) by mouth daily. (Patient taking differently: Take 20 mg by mouth daily as needed for fluid or edema. ) 90 tablet 3  . levothyroxine (SYNTHROID) 100 MCG tablet Take 100 mcg by mouth daily before breakfast.     . Melatonin 5 MG CAPS Take 5 mg by mouth at bedtime as needed (sleep).     . metoprolol tartrate (LOPRESSOR) 50 MG tablet Take 50 mg by mouth 2 (two) times daily.    . Multiple Vitamins-Minerals (MULTIVITAMIN ADULTS 50+) TABS Take 1 tablet by mouth daily.    [name] oxyCODONE (OXY IR/ROXICODONE) 5 MG immediate release tablet Take 1 tablet (5 mg total) by mouth every 4 (four) hours as needed for moderate pain or severe pain. 10 tablet 0  . pantoprazole (PROTONIX) 40 MG tablet Take 1 tablet (40 mg total) by mouth daily. 30 tablet 0  . saccharomyces boulardii (FLORASTOR) 250 MG capsule Take 1 capsule (250 mg total) by mouth 2 (two)  times daily. 60 capsule 0  . simvastatin (ZOCOR) 80 MG tablet Take 80 mg by mouth every evening.     No current facility-administered medications for this visit.     REVIEW OF SYSTEMS:   Constitutional: ( - ) fevers, ( - )  chills , ( - ) night sweats Eyes: ( - ) blurriness of vision, ( - ) double vision, ( - ) watery eyes Ears, nose, mouth, throat, and face: ( - ) mucositis, ( - ) sore throat Respiratory: ( - ) cough, ( - ) dyspnea, ( - ) wheezes Cardiovascular: ( - ) palpitation, ( - ) chest discomfort, ( + ) lower extremity swelling Gastrointestinal:  ( - ) nausea, ( - ) heartburn, ( - ) change in bowel habits Skin: ( - ) abnormal skin rashes Lymphatics: ( - ) new lymphadenopathy, ( - ) easy bruising Neurological: ( - ) numbness, ( - ) tingling, ( - ) new weaknesses Behavioral/Psych: ( - ) mood change, ( - ) new changes  All other systems were reviewed with the patient and are negative.  PHYSICAL EXAMINATION: ECOG PERFORMANCE STATUS: 2 - Symptomatic, <50% confined to bed  (The following exam findings are based on observation only due to virtual telemedicine visit)  GENERAL: alert, no distress and comfortable RESPIRATORY: normal breathing effort PSYCH: alert & oriented x 3, fluent speech  LABORATORY DATA:  I have reviewed the data as listed Lab Results  Component Value Date   WBC 9.2 05/27/2018   HGB 7.1 (L) 05/27/2018   HCT 24.1 (L) 05/27/2018   MCV 85.2 05/27/2018   PLT 276 05/27/2018   Lab Results  Component Value Date   NA 136 05/26/2018   K 4.3 05/26/2018   CL 103 05/26/2018   CO2 26 05/26/2018    RADIOGRAPHIC STUDIES: I have personally reviewed the radiological images as listed and agreed with the findings in the report. Ct Chest W Contrast  Result Date: 05/20/2018 CLINICAL DATA:  Possible GI bleed, right colon mass, probable malignancy EXAM: CT CHEST, ABDOMEN, AND PELVIS WITH CONTRAST TECHNIQUE: Multidetector CT imaging of the chest, abdomen and pelvis was  performed following the standard protocol during bolus administration of intravenous contrast. CONTRAST:  17m OMNIPAQUE IOHEXOL 300 MG/ML  SOLN COMPARISON:  CT chest abdomen pelvis TAVR protocol, 02/24/2018 FINDINGS: CT CHEST FINDINGS Cardiovascular: Interval placement of aortic valve stent endograft. Cardiomegaly with gross enlargement of the left atrium and dense calcification of the mitral valve and annulus. Coronary artery calcifications. Left chest multi lead pacer. No pericardial effusion. Mediastinum/Nodes: Enlarged mediastinal and hilar lymph nodes, unchanged from prior examination, largest pretracheal node measuring 1.9 cm in short axis (series 3, image 23). Thyroid gland, trachea, and esophagus demonstrate no significant findings. Lungs/Pleura: Moderate right, small left pleural effusions and associated atelectasis or consolidation. There is mild, diffuse interlobular septal thickening, most consistent with edema. Musculoskeletal: There is a lobulated 2.6 cm mass in the central right breast (series 3, image 39). CT ABDOMEN PELVIS FINDINGS Hepatobiliary: No focal liver abnormality is seen. No gallstones, gallbladder wall thickening, or biliary dilatation. Pancreas: Unremarkable. No pancreatic ductal dilatation or surrounding inflammatory changes. Spleen: Normal in size without focal abnormality. Adrenals/Urinary Tract: Adrenal glands are unremarkable. Tiny nonobstructive right renal calculi. Bladder is unremarkable. Stomach/Bowel: Stomach is within normal limits. Focal soft tissue thickening of the colon wall superior to the ileocecal valve may reflect reported ascending colon mass. There is extensive fatty mural stratification of the cecum. Vascular/Lymphatic: Severe calcific atherosclerosis of the abdominal aorta and branch vessels. No enlarged abdominal or pelvic lymph nodes. Reproductive: No mass or other abnormality. Other: No abdominal wall hernia or abnormality. No abdominopelvic ascites.  Musculoskeletal: Redemonstrated mild wedge deformity of the T12 vertebral body. IMPRESSION: 1. No CT findings of the abdomen or  pelvis to localize suspected GI bleeding. No intraluminal contrast noted. 2. Focal soft tissue thickening of the colon wall superior to the ileocecal valve may reflect reported ascending colon mass. 3. There is extensive fatty mural stratification of the cecum, suggesting chronic inflammatory sequelae of colitis, including Crohn's disease. 4. Moderate right, small left pleural effusions and associated atelectasis or consolidation. There is mild, diffuse interlobular septal thickening, most consistent with edema. 5.  Interval placement of aortic valve stent endograft. 6. There is a lobulated 2.6 cm mass in the central right breast (series 3, image 39), as seen on prior examination. Correlate with mammography. 7.  Other chronic and incidental findings as detailed above. Electronically Signed   By: Eddie Candle M.D.   On: 05/20/2018 16:53   Ct Abdomen Pelvis W Contrast  Result Date: 05/20/2018 CLINICAL DATA:  Possible GI bleed, right colon mass, probable malignancy EXAM: CT CHEST, ABDOMEN, AND PELVIS WITH CONTRAST TECHNIQUE: Multidetector CT imaging of the chest, abdomen and pelvis was performed following the standard protocol during bolus administration of intravenous contrast. CONTRAST:  14m OMNIPAQUE IOHEXOL 300 MG/ML  SOLN COMPARISON:  CT chest abdomen pelvis TAVR protocol, 02/24/2018 FINDINGS: CT CHEST FINDINGS Cardiovascular: Interval placement of aortic valve stent endograft. Cardiomegaly with gross enlargement of the left atrium and dense calcification of the mitral valve and annulus. Coronary artery calcifications. Left chest multi lead pacer. No pericardial effusion. Mediastinum/Nodes: Enlarged mediastinal and hilar lymph nodes, unchanged from prior examination, largest pretracheal node measuring 1.9 cm in short axis (series 3, image 23). Thyroid gland, trachea, and esophagus  demonstrate no significant findings. Lungs/Pleura: Moderate right, small left pleural effusions and associated atelectasis or consolidation. There is mild, diffuse interlobular septal thickening, most consistent with edema. Musculoskeletal: There is a lobulated 2.6 cm mass in the central right breast (series 3, image 39). CT ABDOMEN PELVIS FINDINGS Hepatobiliary: No focal liver abnormality is seen. No gallstones, gallbladder wall thickening, or biliary dilatation. Pancreas: Unremarkable. No pancreatic ductal dilatation or surrounding inflammatory changes. Spleen: Normal in size without focal abnormality. Adrenals/Urinary Tract: Adrenal glands are unremarkable. Tiny nonobstructive right renal calculi. Bladder is unremarkable. Stomach/Bowel: Stomach is within normal limits. Focal soft tissue thickening of the colon wall superior to the ileocecal valve may reflect reported ascending colon mass. There is extensive fatty mural stratification of the cecum. Vascular/Lymphatic: Severe calcific atherosclerosis of the abdominal aorta and branch vessels. No enlarged abdominal or pelvic lymph nodes. Reproductive: No mass or other abnormality. Other: No abdominal wall hernia or abnormality. No abdominopelvic ascites. Musculoskeletal: Redemonstrated mild wedge deformity of the T12 vertebral body. IMPRESSION: 1. No CT findings of the abdomen or pelvis to localize suspected GI bleeding. No intraluminal contrast noted. 2. Focal soft tissue thickening of the colon wall superior to the ileocecal valve may reflect reported ascending colon mass. 3. There is extensive fatty mural stratification of the cecum, suggesting chronic inflammatory sequelae of colitis, including Crohn's disease. 4. Moderate right, small left pleural effusions and associated atelectasis or consolidation. There is mild, diffuse interlobular septal thickening, most consistent with edema. 5.  Interval placement of aortic valve stent endograft. 6. There is a lobulated  2.6 cm mass in the central right breast (series 3, image 39), as seen on prior examination. Correlate with mammography. 7.  Other chronic and incidental findings as detailed above. Electronically Signed   By: AEddie CandleM.D.   On: 05/20/2018 16:53    PATHOLOGY: I have reviewed the pathology reports as documented in the oncologist history.

## 2018-06-08 ENCOUNTER — Telehealth: Payer: Self-pay | Admitting: Genetic Counselor

## 2018-06-08 ENCOUNTER — Telehealth: Payer: Self-pay | Admitting: *Deleted

## 2018-06-08 ENCOUNTER — Encounter: Payer: Self-pay | Admitting: Hematology

## 2018-06-08 ENCOUNTER — Telehealth: Payer: Self-pay | Admitting: Hematology

## 2018-06-08 ENCOUNTER — Inpatient Hospital Stay: Payer: Medicare Other | Attending: Hematology | Admitting: Hematology

## 2018-06-08 ENCOUNTER — Encounter: Payer: Self-pay | Admitting: *Deleted

## 2018-06-08 DIAGNOSIS — N649 Disorder of breast, unspecified: Secondary | ICD-10-CM | POA: Diagnosis not present

## 2018-06-08 DIAGNOSIS — C19 Malignant neoplasm of rectosigmoid junction: Secondary | ICD-10-CM

## 2018-06-08 DIAGNOSIS — C182 Malignant neoplasm of ascending colon: Secondary | ICD-10-CM | POA: Diagnosis not present

## 2018-06-08 DIAGNOSIS — D649 Anemia, unspecified: Secondary | ICD-10-CM | POA: Diagnosis not present

## 2018-06-08 NOTE — Telephone Encounter (Signed)
A genetic counseling appt has been scheduled for the pt to see Roma Kayser on 7/8 at 1pm. Letter mailed.

## 2018-06-08 NOTE — Progress Notes (Signed)
Patient has completed visit with Dr Maylon Peppers. At this time she is unsure of how much she wants to do regarding follow up. She is not wanting to [facility]. She is also not sure if she wants to follow up with our office for continued surveillance of her colon cancer. She is 36 with multiple other medical co morbidities so this approach is not unreasonable. The patient's visit was completed with the daughter, and not the son who manages the mothers care and not present.  Dr Maylon Peppers has also placed referral for patient to have genetic counseling. Appointment made for 07/19/18.  Sent the following to the son's email:  Just following up after your mom's visit with Dr Maylon Peppers. It sounds as though your mom wants to hold off on any further testing. She didn't want to go forward with the breast mammogram and possible biopsy on 06/12/18. She wasn't even sure that the wanted to continue follow up with our office. I think she and your sister said they would talk with you and come up with a plan. If you'd like I can cancel the appointment at the [facility], I just wanted to check with you first.  Also, with the pathology on your mom's colon mass, there is some concern for inheritable genetic mutations which she might have and Dr Maylon Peppers would like to send her for genetic counselling. The appointment is already made, so you may already be aware of this. While it may not be important for your mother, if she is not interested in any treatment, it may alert her to possible genetic issues that could be passed down through children, and therefor important for you and other children. Hopefully she'll be okay with proceeding with this, but if not, let me know and I can cancel this appointment as well.   Spoke to the son, Will, on Friday. He states he and the patient spoke last night. She does not want to follow through with the appointments [facility] on [facility]. I will call and cancel  these appointments.  Spoke more in detail about the genetic counselor appointment and he does want this to stay as scheduled.   He also states patient is agreeable to follow up with Dr Maylon Peppers. Notified Dr Maylon Peppers so that he can send a LOS to the scheduler.  Son has my contact information if needed. Since patient doesn't have any further work up needed and is proceeding with surveillance, I will sign off on active navigation.

## 2018-06-08 NOTE — Telephone Encounter (Signed)
Call placed requesting pt lab work.

## 2018-06-08 NOTE — Telephone Encounter (Signed)
To be determined per 5/28 los

## 2018-06-08 NOTE — Telephone Encounter (Signed)
S/w Meredith at pt living facility, labs resulted and will be faxed over now.

## 2018-06-09 ENCOUNTER — Ambulatory Visit (INDEPENDENT_AMBULATORY_CARE_PROVIDER_SITE_OTHER): Payer: Medicare Other | Admitting: Nurse Practitioner

## 2018-06-09 ENCOUNTER — Other Ambulatory Visit: Payer: Self-pay

## 2018-06-09 ENCOUNTER — Encounter: Payer: Self-pay | Admitting: Nurse Practitioner

## 2018-06-09 VITALS — BP 102/64 | HR 67 | Ht 61.0 in | Wt 179.0 lb

## 2018-06-09 DIAGNOSIS — N390 Urinary tract infection, site not specified: Secondary | ICD-10-CM | POA: Diagnosis not present

## 2018-06-09 DIAGNOSIS — I69322 Dysarthria following cerebral infarction: Secondary | ICD-10-CM | POA: Diagnosis not present

## 2018-06-09 DIAGNOSIS — Z952 Presence of prosthetic heart valve: Secondary | ICD-10-CM | POA: Diagnosis not present

## 2018-06-09 DIAGNOSIS — D649 Anemia, unspecified: Secondary | ICD-10-CM | POA: Diagnosis not present

## 2018-06-09 DIAGNOSIS — Z950 Presence of cardiac pacemaker: Secondary | ICD-10-CM | POA: Diagnosis not present

## 2018-06-09 DIAGNOSIS — I110 Hypertensive heart disease with heart failure: Secondary | ICD-10-CM | POA: Diagnosis not present

## 2018-06-09 DIAGNOSIS — I350 Nonrheumatic aortic (valve) stenosis: Secondary | ICD-10-CM | POA: Diagnosis not present

## 2018-06-09 DIAGNOSIS — I5033 Acute on chronic diastolic (congestive) heart failure: Secondary | ICD-10-CM | POA: Diagnosis not present

## 2018-06-09 DIAGNOSIS — I69331 Monoplegia of upper limb following cerebral infarction affecting right dominant side: Secondary | ICD-10-CM | POA: Diagnosis not present

## 2018-06-09 LAB — CUP PACEART INCLINIC DEVICE CHECK
Battery Remaining Longevity: 139 mo
Battery Voltage: 3.17 V
Brady Statistic AP VP Percent: 4.7 %
Brady Statistic AP VS Percent: 0.01 %
Brady Statistic AS VP Percent: 94.84 %
Brady Statistic AS VS Percent: 0.46 %
Brady Statistic RA Percent Paced: 4.76 %
Brady Statistic RV Percent Paced: 99.53 %
Date Time Interrogation Session: 20200529113028
Implantable Lead Implant Date: 20200220
Implantable Lead Implant Date: 20200220
Implantable Lead Location: 753859
Implantable Lead Location: 753860
Implantable Lead Model: 3830
Implantable Lead Model: 5076
Implantable Pulse Generator Implant Date: 20200220
Lead Channel Impedance Value: 304 Ohm
Lead Channel Impedance Value: 323 Ohm
Lead Channel Impedance Value: 494 Ohm
Lead Channel Impedance Value: 608 Ohm
Lead Channel Pacing Threshold Amplitude: 0.625 V
Lead Channel Pacing Threshold Amplitude: 0.75 V
Lead Channel Pacing Threshold Pulse Width: 0.4 ms
Lead Channel Pacing Threshold Pulse Width: 0.4 ms
Lead Channel Sensing Intrinsic Amplitude: 23.75 mV
Lead Channel Sensing Intrinsic Amplitude: 25.375 mV
Lead Channel Sensing Intrinsic Amplitude: 3.25 mV
Lead Channel Sensing Intrinsic Amplitude: 3.75 mV
Lead Channel Setting Pacing Amplitude: 2.25 V
Lead Channel Setting Pacing Amplitude: 2.5 V
Lead Channel Setting Pacing Pulse Width: 0.4 ms
Lead Channel Setting Sensing Sensitivity: 2.8 mV

## 2018-06-09 NOTE — Patient Instructions (Signed)
Medication Instructions:  none If you need a refill on your cardiac medications before your next appointment, please call your pharmacy.   Lab work: none If you have labs (blood work) drawn today and your tests are completely normal, you will receive your results only by: [name] MyChart Message (if you have MyChart) OR . A paper copy in the mail If you have any lab test that is abnormal or we need to change your treatment, we will call you to review the results.  Testing/Procedures: none  Follow-Up: At [facility], you and your health needs are our priority.  As part of our continuing mission to provide you with exceptional heart care, we have created designated Provider Care Teams.  These Care Teams include your primary Cardiologist (physician) and Advanced Practice Providers (APPs -  Physician Assistants and Nurse Practitioners) who all work together to provide you with the care you need, when you need it. You will need a follow up appointment in 1 years.  Please call our office 2 months in advance to schedule this appointment.  You may see Cristopher Peru, MD or one of the following Advanced Practice Providers on your designated Care Team:   Chanetta Marshall, NP . Tommye Standard, PA-C  Any Other Special Instructions Will Be Listed Below (If Applicable). Remote monitoring is used to monitor your Pacemaker  from home. This monitoring reduces the number of office visits required to check your device to one time per year. It allows [place] to keep an eye on the functioning of your device to ensure it is working properly. You are scheduled for a device check from home on 09/04/18. You may send your transmission at any time that day. If you have a wireless device, the transmission will be sent automatically. After your physician reviews your transmission, you will receive a postcard with your next transmission date.

## 2018-06-09 NOTE — Progress Notes (Signed)
Electrophysiology Office Note Date: 06/09/2018  ID:  Julie Mcgee, DOB Jul 28, 1933, MRN [id number]  PCP: Lyman Bishop, DO Primary Cardiologist: Dr. Burt Knack Electrophysiologist: Dr. Lovena Le  CC: Pacemaker follow-up  Julie Mcgee is a 85 y.o. female seen today for Pacemaker Follow up.  She presents today for routine electrophysiology followup.  Since last being seen in our clinic, the patient reports doing very well while recovering from recent Colon CA surgery. She denies chest pain, palpitations, dyspnea, PND, orthopnea, nausea, vomiting, dizziness, syncope, edema, or weight gain.  Device History: MDT- Dual Chamber (HIS Bundle Lead) implanted 03/02/18 for Symptomatic bradycardia.  Past Medical History:  Diagnosis Date  . Arthritis   . Carotid artery stenosis   . Chronic diastolic CHF (congestive heart failure) ([place])   . Former smoker    1/2 ppd for 45 years  . 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 Hyperlipidemia   . Hypertension   . Hypothyroidism   . Peripheral vascular disease ([place])   . RBBB   . 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 Severe aortic stenosis    Past Surgical History:  Procedure Laterality Date  . BASAL CELL CARCINOMA EXCISION  2018  . BIOPSY  05/19/2018   Procedure: BIOPSY;  Surgeon: Lavena Bullion, DO;  Location: [place];  Service: Gastroenterology;;  . CARDIAC CATHETERIZATION    . COLONOSCOPY  2001  . COLONOSCOPY WITH PROPOFOL N/A 05/19/2018   Procedure: COLONOSCOPY WITH PROPOFOL;  Surgeon: Lavena Bullion, DO;  Location: [place];  Service: Gastroenterology;  Laterality: N/A;  . ESOPHAGOGASTRODUODENOSCOPY (EGD) WITH PROPOFOL N/A 05/19/2018   Procedure: ESOPHAGOGASTRODUODENOSCOPY (EGD) WITH PROPOFOL;  Surgeon: Lavena Bullion, DO;  Location: [place];  Service:  Gastroenterology;  Laterality: N/A;  . HIP SURGERY  2005  . KNEE SURGERY  1998  . LAPAROSCOPIC PARTIAL COLECTOMY N/A 05/24/2018   Procedure: LAPAROSCOPIC ASSISTED ASCENDING HEMICOLECTOMY;  Surgeon: Ileana Roup, MD;  Location: [place];  Service: General;  Laterality: N/A;  . PACEMAKER IMPLANT N/A 03/02/2018   Procedure: PACEMAKER IMPLANT;  Surgeon: Evans Lance, MD;  Location: [place] [facility];  Service: Cardiovascular;  Laterality: N/A;  . RIGHT/LEFT HEART CATH AND CORONARY ANGIOGRAPHY N/A 02/21/2018   Procedure: RIGHT/LEFT HEART CATH AND CORONARY ANGIOGRAPHY;  Surgeon: Sherren Mocha, MD;  Location: [place] [facility];  Service: Cardiovascular;  Laterality: N/A;  . SUBMUCOSAL TATTOO INJECTION  05/19/2018   Procedure: SUBMUCOSAL TATTOO INJECTION;  Surgeon: Lavena Bullion, DO;  Location: [place];  Service: Gastroenterology;;  . TEE WITHOUT CARDIOVERSION N/A 02/28/2018   Procedure: TRANSESOPHAGEAL ECHOCARDIOGRAM (TEE);  Surgeon: Sherren Mocha, MD;  Location: [place] [facility];  Service: Open Heart Surgery;  Laterality: N/A;  . TEMPORARY PACEMAKER N/A 03/01/2018   Procedure: TEMPORARY PACEMAKER;  Surgeon: Burnell Blanks, MD;  Location: [place] [facility];  Service: Cardiovascular;  Laterality: N/A;  . TONSILLECTOMY    . TRANSCATHETER AORTIC VALVE REPLACEMENT, TRANSFEMORAL N/A 02/28/2018   Procedure: TRANSCATHETER AORTIC VALVE REPLACEMENT, TRANSFEMORAL;  Surgeon: Sherren Mocha, MD;  Location: [place] [facility];  Service: Open Heart Surgery;  Laterality: N/A;    Current Outpatient Medications  Medication Sig Dispense Refill  . amoxicillin (AMOXIL) 500 MG tablet Please take 2,000 mg (4 tablets) one hour prior to all dental procedures. 8 tablet 8  . aspirin EC 81 MG  tablet Take 1 tablet (81 mg total) by mouth daily.    . Calcium Carb-Cholecalciferol (CALTRATE 600+D3 PO) Take 1 tablet by mouth 2 (two) times daily.    . clopidogrel (PLAVIX) 75 MG tablet Take 1 tablet (75  mg total) by mouth daily with breakfast. 90 tablet 1  . diphenhydrAMINE (BENADRYL) 25 MG tablet Take 25 mg by mouth every 6 (six) hours as needed.    . ferrous sulfate 325 (65 FE) MG tablet Take 1 tablet (325 mg total) by mouth every other day. 15 tablet 0  . furosemide (LASIX) 20 MG tablet Take 1 tablet (20 mg total) by mouth daily. 90 tablet 3  . levothyroxine (SYNTHROID) 100 MCG tablet Take 100 mcg by mouth daily before breakfast.     . Melatonin 5 MG CAPS Take 5 mg by mouth at bedtime as needed (sleep).     . metoprolol tartrate (LOPRESSOR) 50 MG tablet Take 50 mg by mouth 2 (two) times daily.    . Multiple Vitamins-Minerals (MULTIVITAMIN ADULTS 50+) TABS Take 1 tablet by mouth daily.    [name] oxyCODONE (OXY IR/ROXICODONE) 5 MG immediate release tablet Take 1 tablet (5 mg total) by mouth every 4 (four) hours as needed for moderate pain or severe pain. 10 tablet 0  . pantoprazole (PROTONIX) 40 MG tablet Take 1 tablet (40 mg total) by mouth daily. 30 tablet 0  . simvastatin (ZOCOR) 80 MG tablet Take 80 mg by mouth every evening.     No current facility-administered medications for this visit.     Allergies:   Adhesive [tape]   Social History: Social History   Socioeconomic History  . Marital status: Married    Spouse name: Not on file  . Number of children: Not on file  . Years of education: Not on file  . Highest education level: Not on file  Occupational History  . Not on file  Social Needs  . Financial resource strain: Not on file  . Food insecurity:    Worry: Not on file    Inability: Not on file  . Transportation needs:    Medical: Not on file    Non-medical: Not on file  Tobacco Use  . Smoking status: Former Smoker    Packs/day: 1.00    Years: 45.00    Pack years: 45.00    Types: Cigarettes    Last attempt to quit: 05/23/2009    Years since quitting: 9.0  . Smokeless tobacco: Never Used  Substance and Sexual Activity  . Alcohol use: Not Currently  . Drug use: Never   . Sexual activity: Not on file  Lifestyle  . Physical activity:    Days per week: Not on file    Minutes per session: Not on file  . Stress: Not on file  Relationships  . Social connections:    Talks on phone: Not on file    Gets together: Not on file    Attends religious service: Not on file    Active member of club or organization: Not on file    Attends meetings of clubs or organizations: Not on file    Relationship status: Not on file  . Intimate partner violence:    Fear of current or ex partner: Not on file    Emotionally abused: Not on file    Physically abused: Not on file    Forced sexual activity: Not on file  Other Topics Concern  . Not on file  Social History Narrative  .  Not on file    Family History: Family History  Problem Relation Age of Onset  . Heart failure Mother   . Chronic Renal Failure Mother   . Alcohol abuse Father      Review of Systems: All other systems reviewed and are otherwise negative except as noted above.   Physical Exam: VS:  BP 102/64   Pulse 67   Ht 5\' 1"  (1.549 m)   Wt 179 lb (81.2 kg)   SpO2 99%   BMI 33.82 kg/m  , BMI Body mass index is 33.82 kg/m.  GEN- The patient is well appearing, alert and oriented x 3 today.   HEENT: normocephalic, atraumatic; sclera clear, conjunctiva pink; hearing intact; oropharynx clear; neck supple  Lungs- Clear to ausculation bilaterally, normal work of breathing.  No wheezes, rales, rhonchi Heart- Regular rate and rhythm, no murmurs, rubs or gallops  GI- soft, non-tender, non-distended, bowel sounds present  Extremities- no clubbing, cyanosis, or edema  MS- no significant deformity or atrophy Skin- warm and dry, no rash or lesion; PPM pocket well healed Psych- euthymic mood, full affect Neuro- strength and sensation are intact  PPM Interrogation- reviewed in detail today,  See PACEART report  EKG:  EKG is ordered today. The ekg ordered today shows HIS bundle septal pacing down to LOC  at 0.25 V @ 1.0 ms.  Recent Labs: 02/18/2018: TSH 3.355 02/27/2018: B Natriuretic Peptide 1,020.2 03/01/2018: Magnesium 2.1 05/19/2018: ALT 9 05/26/2018: BUN 10; Creatinine, Ser 0.86; Potassium 4.3; Sodium 136 05/27/2018: Hemoglobin 7.1; Platelets 276   Wt Readings from Last 3 Encounters:  06/09/18 179 lb (81.2 kg)  05/27/18 185 lb 10 oz (84.2 kg)  03/16/18 196 lb 12.8 oz (89.3 kg)     Other studies Reviewed: Additional studies/ records that were reviewed today include: Previous labwork.  Review of the above records today demonstrates: Stability.  Assessment and Plan:  1.  Symptomatic bradycardia Normal PPM function See Claudia Desanctis Art report. HIS bundle pacing appeared septal down to LOC at 0.25 V @ 1.0 ms.  Device reprogramed to MVP mode with underlying NSR.  2. Severe AS s/p TAVR  Per structural team  3. Colon CA s/p hemicolectomy Doing well. She states her Hgb is trending back up compared to our most recent labwork.    Current medicines are reviewed at length with the patient today.   The patient does not have concerns regarding her medicines.  The following changes were made today:  none  Labs/ tests ordered today include:  Orders Placed This Encounter  Procedures  . CUP PACEART [place]  . EKG 12-Lead    Disposition:   Follow up remotely in 3 months, and then annually with Dr. Lovena Le.   Devin Going, PA-C 06/09/2018 1:23 PM  06/09/2018 12:15 PM  [address] [place] [address] [phone] (office) [phone] (fax)

## 2018-06-12 ENCOUNTER — Other Ambulatory Visit: Payer: Medicare Other

## 2018-06-13 DIAGNOSIS — I110 Hypertensive heart disease with heart failure: Secondary | ICD-10-CM | POA: Diagnosis not present

## 2018-06-13 DIAGNOSIS — I69322 Dysarthria following cerebral infarction: Secondary | ICD-10-CM | POA: Diagnosis not present

## 2018-06-13 DIAGNOSIS — N390 Urinary tract infection, site not specified: Secondary | ICD-10-CM | POA: Diagnosis not present

## 2018-06-13 DIAGNOSIS — I5033 Acute on chronic diastolic (congestive) heart failure: Secondary | ICD-10-CM | POA: Diagnosis not present

## 2018-06-13 DIAGNOSIS — I69331 Monoplegia of upper limb following cerebral infarction affecting right dominant side: Secondary | ICD-10-CM | POA: Diagnosis not present

## 2018-06-13 DIAGNOSIS — I350 Nonrheumatic aortic (valve) stenosis: Secondary | ICD-10-CM | POA: Diagnosis not present

## 2018-06-15 ENCOUNTER — Telehealth: Payer: Self-pay | Admitting: Cardiology

## 2018-06-15 DIAGNOSIS — I110 Hypertensive heart disease with heart failure: Secondary | ICD-10-CM | POA: Diagnosis not present

## 2018-06-15 DIAGNOSIS — I69331 Monoplegia of upper limb following cerebral infarction affecting right dominant side: Secondary | ICD-10-CM | POA: Diagnosis not present

## 2018-06-15 DIAGNOSIS — I5033 Acute on chronic diastolic (congestive) heart failure: Secondary | ICD-10-CM | POA: Diagnosis not present

## 2018-06-15 DIAGNOSIS — I69322 Dysarthria following cerebral infarction: Secondary | ICD-10-CM | POA: Diagnosis not present

## 2018-06-15 DIAGNOSIS — I350 Nonrheumatic aortic (valve) stenosis: Secondary | ICD-10-CM | POA: Diagnosis not present

## 2018-06-15 DIAGNOSIS — N390 Urinary tract infection, site not specified: Secondary | ICD-10-CM | POA: Diagnosis not present

## 2018-06-15 NOTE — Telephone Encounter (Signed)
The patient was seen for her 1 mo TAVR visit in March. At that time, K. Grandville [name], [place] established the patient with Dr. Radford Pax with a visit tomorrow because her office is closer to the patient's home.  Called and spoke directly with the patient who denies SOB but does state she has slight swelling in her feet and ankles. She reports no weight gain. Her shoes still easily fit. She reports she is limiting her salt, but she also states she eats TV dinners almost daily. Reiterated to her that TV dinners are filled with sodium and encouraged her to avoid eating them. Also encouraged her to elevate her legs when sitting.   She and her husband were originally scheduled for an appointment tomorrow with Dr. Radford Pax but both appointments were rescheduled to July due to provider conflict. Rescheduled both patients to 6/9 for virtual visits. Their son, Will, will be there to help them.   She will call prior to appointment if symptoms worsen.

## 2018-06-15 NOTE — Telephone Encounter (Signed)
New message   Per Valetta Fuller K. Has set up appts for these patients to see Dr. Radford Pax due to distance for the next visit.  Pt c/o swelling: STAT is pt has developed SOB within 24 hours  1) How much weight have you gained and in what time span? Patient has not gained any weight per son Will  2) If swelling, where is the swelling located? Ankles and feet   3) Are you currently taking a fluid pill? Yes   4) Are you currently SOB? Yes   5) Do you have a log of your daily weights (if so, list)? Per [facility] keeps a log of weight   6) Have you gained 3 pounds in a day or 5 pounds in a week? No   7) Have you traveled recently? No

## 2018-06-16 ENCOUNTER — Ambulatory Visit: Payer: Medicare Other | Admitting: Cardiology

## 2018-06-16 DIAGNOSIS — I69331 Monoplegia of upper limb following cerebral infarction affecting right dominant side: Secondary | ICD-10-CM | POA: Diagnosis not present

## 2018-06-16 DIAGNOSIS — I110 Hypertensive heart disease with heart failure: Secondary | ICD-10-CM | POA: Diagnosis not present

## 2018-06-16 DIAGNOSIS — I5033 Acute on chronic diastolic (congestive) heart failure: Secondary | ICD-10-CM | POA: Diagnosis not present

## 2018-06-16 DIAGNOSIS — I69322 Dysarthria following cerebral infarction: Secondary | ICD-10-CM | POA: Diagnosis not present

## 2018-06-16 DIAGNOSIS — I350 Nonrheumatic aortic (valve) stenosis: Secondary | ICD-10-CM | POA: Diagnosis not present

## 2018-06-16 DIAGNOSIS — N390 Urinary tract infection, site not specified: Secondary | ICD-10-CM | POA: Diagnosis not present

## 2018-06-16 NOTE — Progress Notes (Signed)
Remote pacemaker transmission.   

## 2018-06-19 DIAGNOSIS — I69331 Monoplegia of upper limb following cerebral infarction affecting right dominant side: Secondary | ICD-10-CM | POA: Diagnosis not present

## 2018-06-19 DIAGNOSIS — I350 Nonrheumatic aortic (valve) stenosis: Secondary | ICD-10-CM | POA: Diagnosis not present

## 2018-06-19 DIAGNOSIS — I5030 Unspecified diastolic (congestive) heart failure: Secondary | ICD-10-CM | POA: Insufficient documentation

## 2018-06-19 DIAGNOSIS — I110 Hypertensive heart disease with heart failure: Secondary | ICD-10-CM | POA: Diagnosis not present

## 2018-06-19 DIAGNOSIS — I5032 Chronic diastolic (congestive) heart failure: Secondary | ICD-10-CM | POA: Insufficient documentation

## 2018-06-19 DIAGNOSIS — N390 Urinary tract infection, site not specified: Secondary | ICD-10-CM | POA: Diagnosis not present

## 2018-06-19 DIAGNOSIS — I69322 Dysarthria following cerebral infarction: Secondary | ICD-10-CM | POA: Diagnosis not present

## 2018-06-19 DIAGNOSIS — I5033 Acute on chronic diastolic (congestive) heart failure: Secondary | ICD-10-CM | POA: Diagnosis not present

## 2018-06-19 NOTE — Progress Notes (Signed)
Virtual Visit via Video Note   This visit type was conducted due to national recommendations for restrictions regarding the COVID-19 Pandemic (e.g. social distancing) in an effort to limit this patient's exposure and mitigate transmission in our community.  Due to her co-morbid illnesses, this patient is at least at moderate risk for complications without adequate follow up.  This format is felt to be most appropriate for this patient at this time.  All issues noted in this document were discussed and addressed.  A limited physical exam was performed with this format.  Please refer to the patient's chart for her consent to telehealth for [facility].  Evaluation Performed:  Follow-up visit  This visit type was conducted due to national recommendations for restrictions regarding the COVID-19 Pandemic (e.g. social distancing).  This format is felt to be most appropriate for this patient at this time.  All issues noted in this document were discussed and addressed.  No physical exam was performed (except for noted visual exam findings with Video Visits).  Please refer to the patient's chart (MyChart message for video visits and phone note for telephone visits) for the patient's consent to telehealth for [facility]  Date:  06/20/2018   ID:  Julie Mcgee, DOB April 30, 1933, MRN [id number]  Patient Location:  Home  Provider location:   [place]  PCP:  Lyman Bishop, DO  Cardiologist:  Fransico Him, MD Electrophysiologist:  Cristopher Peru, MD   Chief Complaint:  AS, chronic diastolic CHF, CHB s/p PPM and HTN  History of Present Illness:    Julie Mcgee is a 85 y.o. female who presents via audio/video conferencing for a telehealth visit today.    This is a 85yo female with a history severe AS s/p 26m Edwards Sapien 3 THV TAVR 02/2018, RBBB, carotid artery stenosis, Hypertension, hyperlipidemia, complete heart block s/p PPM, chronic diastolic CHF.  She is followed in device clinic for  her PPM.  Unfortunately she was diagnosed with colon cancer in her ascending colon after stenting with a GI bleed.  She had been on DAPT for her recent TAVR and that was held.  She had hemicolectomy on 05/24/2018 for invasive adeno CA.  Prior to discharge her hemoglobin was stable at 7.1 and she was placed back on DAPT therapy.  Prior to discharge she was also noted to have a mass in her right breast by CT that was suspicious for cancer and this is going to be worked up as an outpatient.  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  They reviewed her staging scans which showed a 2.6 cm lobulated mass in the right central breast with enlarged mediastinal and hilar lymph nodes.  It was felt that this was likely a primary breast cancer and not metastasis.  Patient was not ready to consider further work-up at this time.  Her follow hemoglobin was 9.3 on 06/07/2018.  She is here today for followup and is doing well.  She denies any chest pain or pressure, SOB, DOE, PND, orthopnea, LE edema, dizziness, palpitations or syncope. She is compliant with her meds and is tolerating meds with no SE.    The patient does not have symptoms concerning for COVID-19 infection (fever, chills, cough, or new shortness of breath).    Prior CV studies:   The following studies were reviewed today:  2D echo  Past Medical History:  Diagnosis  Date  . Arthritis   . Carotid artery stenosis   . Chronic diastolic CHF (congestive heart failure) ([place])   . Former smoker    1/2 ppd for 45 years  . 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 Hyperlipidemia   . Hypertension   . Hypothyroidism   . Peripheral vascular disease ([place])   . RBBB   . 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 Severe aortic stenosis    Past Surgical History:  Procedure Laterality Date  . BASAL CELL CARCINOMA EXCISION  2018  . BIOPSY  05/19/2018   Procedure: BIOPSY;  Surgeon: Lavena Bullion, DO;  Location: [place];  Service: Gastroenterology;;  . CARDIAC CATHETERIZATION    . COLONOSCOPY  2001  . COLONOSCOPY WITH PROPOFOL N/A 05/19/2018   Procedure: COLONOSCOPY WITH PROPOFOL;  Surgeon: Lavena Bullion, DO;  Location: [place];  Service: Gastroenterology;  Laterality: N/A;  . ESOPHAGOGASTRODUODENOSCOPY (EGD) WITH PROPOFOL N/A 05/19/2018   Procedure: ESOPHAGOGASTRODUODENOSCOPY (EGD) WITH PROPOFOL;  Surgeon: Lavena Bullion, DO;  Location: [place];  Service: Gastroenterology;  Laterality: N/A;  . HIP SURGERY  2005  . KNEE SURGERY  1998  . LAPAROSCOPIC PARTIAL COLECTOMY N/A 05/24/2018   Procedure: LAPAROSCOPIC ASSISTED ASCENDING HEMICOLECTOMY;  Surgeon: Ileana Roup, MD;  Location: [place];  Service: General;  Laterality: N/A;  . PACEMAKER IMPLANT N/A 03/02/2018   Procedure: PACEMAKER IMPLANT;  Surgeon: Evans Lance, MD;  Location: [place] [facility];  Service: Cardiovascular;  Laterality: N/A;  . RIGHT/LEFT HEART CATH AND CORONARY ANGIOGRAPHY N/A 02/21/2018   Procedure: RIGHT/LEFT HEART CATH AND CORONARY ANGIOGRAPHY;  Surgeon: Sherren Mocha, MD;  Location: [place] [facility];  Service: Cardiovascular;  Laterality: N/A;  . SUBMUCOSAL TATTOO INJECTION  05/19/2018   Procedure: SUBMUCOSAL TATTOO INJECTION;  Surgeon: Lavena Bullion, DO;  Location: [place];  Service: Gastroenterology;;  . TEE WITHOUT CARDIOVERSION N/A 02/28/2018   Procedure: TRANSESOPHAGEAL ECHOCARDIOGRAM (TEE);  Surgeon: Sherren Mocha, MD;  Location: [place] [facility];  Service: Open Heart Surgery;  Laterality: N/A;  . TEMPORARY PACEMAKER N/A 03/01/2018   Procedure: TEMPORARY PACEMAKER;  Surgeon: Burnell Blanks, MD;  Location: [place] [facility];  Service: Cardiovascular;   Laterality: N/A;  . TONSILLECTOMY    . TRANSCATHETER AORTIC VALVE REPLACEMENT, TRANSFEMORAL N/A 02/28/2018   Procedure: TRANSCATHETER AORTIC VALVE REPLACEMENT, TRANSFEMORAL;  Surgeon: Sherren Mocha, MD;  Location: [place] [facility];  Service: Open Heart Surgery;  Laterality: N/A;     Current Meds  Medication Sig  . amoxicillin (AMOXIL) 500 MG tablet Please take 2,000 mg (4 tablets) one hour prior to all dental procedures.  [name] aspirin EC 81 MG tablet Take 1 tablet (81 mg total) by mouth daily.  . Calcium Carb-Cholecalciferol (CALTRATE 600+D3 PO) Take 1 tablet by mouth 2 (two) times daily.  . clopidogrel (PLAVIX) 75 MG tablet Take 1 tablet (75 mg total) by mouth daily with breakfast.  . diphenhydrAMINE (BENADRYL) 25 MG tablet Take 25 mg by mouth every 6 (six) hours as needed.  . ferrous sulfate 325 (65 FE) MG tablet Take 1 tablet (325 mg total) by mouth every other day.  . furosemide (LASIX) 20 MG tablet Take 1 tablet (20 mg total) by mouth daily.  [name] levothyroxine (SYNTHROID) 100 MCG tablet Take 100 mcg by mouth daily before breakfast.   .  Melatonin 5 MG CAPS Take 5 mg by mouth at bedtime as needed (sleep).   . metoprolol tartrate (LOPRESSOR) 50 MG tablet Take 50 mg by mouth 2 (two) times daily.  . Multiple Vitamins-Minerals (MULTIVITAMIN ADULTS 50+) TABS Take 1 tablet by mouth daily.  . pantoprazole (PROTONIX) 40 MG tablet Take 1 tablet (40 mg total) by mouth daily.  [name] saccharomyces boulardii (FLORASTOR) 250 MG capsule Take 250 mg by mouth 2 (two) times daily.  . simvastatin (ZOCOR) 80 MG tablet Take 80 mg by mouth every evening.     Allergies:   Adhesive [tape]   Social History   Tobacco Use  . Smoking status: Former Smoker    Packs/day: 1.00    Years: 45.00    Pack years: 45.00    Types: Cigarettes    Last attempt to quit: 05/23/2009    Years since quitting: 9.0  . Smokeless tobacco: Never Used  Substance Use Topics  . Alcohol use: Not Currently  . Drug use: Never      Family Hx: The patient's family history includes Alcohol abuse in her father; Chronic Renal Failure in her mother; Heart failure in her mother.  ROS:   Please see the history of present illness.     All other systems reviewed and are negative.   Labs/Other Tests and Data Reviewed:    Recent Labs: 02/18/2018: TSH 3.355 02/27/2018: B Natriuretic Peptide 1,020.2 03/01/2018: Magnesium 2.1 05/19/2018: ALT 9 05/26/2018: BUN 10; Creatinine, Ser 0.86; Potassium 4.3; Sodium 136 05/27/2018: Hemoglobin 7.1; Platelets 276   Recent Lipid Panel No results found for: CHOL, TRIG, HDL, CHOLHDL, LDLCALC, LDLDIRECT  Wt Readings from Last 3 Encounters:  06/20/18 181 lb (82.1 kg)  06/09/18 179 lb (81.2 kg)  05/27/18 185 lb 10 oz (84.2 kg)     Objective:    Vital Signs:  BP 139/72   Pulse 61   Ht '5\' 1"'  (1.549 m)   Wt 181 lb (82.1 kg)   BMI 34.20 kg/m    CONSTITUTIONAL:  Well nourished, well developed female in no acute distress.  EYES: anicteric MOUTH: oral mucosa is pink RESPIRATORY: Normal respiratory effort, symmetric expansion CARDIOVASCULAR: No peripheral edema SKIN: No rash, lesions or ulcers MUSCULOSKELETAL: no digital cyanosis NEURO: Cranial Nerves II-XII grossly intact, moves all extremities PSYCH: Intact judgement and insight.  A&O x 3, Mood/affect appropriate   ASSESSMENT & PLAN:    1.  Severe AS -  s/p 52m Edwards Sapien 3 THV TAVR 02/2018.  She is back on DAPT with aspirin 81 mg daily and Plavix 75 mg daily.  She will be able to come off Plavix after July.  Follow-up echo 05/18/2018 3 months past TAVR showed normal LV function with a stable TAVR with  mean aortic valve gradient 19 mmHg.  2.  Hypertension -her blood pressure is controlled.  She will continue on Lopressor 50 mg twice daily.  3.  Hyperlipidemia -her LDL goal is less than 70 due to carotid stenosis.  I will get a copy of her last LDL from her PCP.  She she has been on high-dose simvastatin 80 mg daily and it is  recommended not to go higher than 40 mg daily.  I am going to change her to Simvastatin 45mdaily and repeat an FLP and ALT in 6 weeks.  4.  Bilateral carotid artery stenosis - Carotid Dopplers 02/21/2018 showed 1 to 39% stenosis of the carotid arteries bilaterally.  Continue on aspirin and statin.  5.  Chronic diastolic  CHF -she has not had any significant lower extremity edema or shortness of breath.  Her weight is stable.  She will continue on Lasix 20 mg daily PRN.  6.  AdenoCA of the colon -status post hemicolectomy for stage I colon adeno CA.  7.  Breast mass - felt to likely be a primary cancer but at this time patient not ready to follow-up with oncology for work-up.  COVID-19 Education: The signs and symptoms of COVID-19 were discussed with the patient and how to seek care for testing (follow up with PCP or arrange E-visit).  The importance of social distancing was discussed today.  Patient Risk:   After full review of this patient's clinical status, I feel that they are at least moderate risk at this time.  Time:   Today, I have spent 20 minutes on telehealth video medicine discussing medical problems including chronic diastolic CHF, severe aortic stenosis, bilateral carotid artery stenosis, hypertension, hyperlipidemia.  We also reviewed the symptoms of COVID 19 and the ways to protect against contracting the virus with telehealth technology.  I spent an additional 5 minutes reviewing patient's chart including 2D echo, carotid Dopplers and labs.  Medication Adjustments/Labs and Tests Ordered: Current medicines are reviewed at length with the patient today.  Concerns regarding medicines are outlined above.  Tests Ordered: No orders of the defined types were placed in this encounter.  Medication Changes: No orders of the defined types were placed in this encounter.   Disposition:  Follow up in 3 month(s)  Signed, Fransico Him, MD  06/20/2018 9:15 AM    [place]

## 2018-06-20 ENCOUNTER — Telehealth (INDEPENDENT_AMBULATORY_CARE_PROVIDER_SITE_OTHER): Payer: Medicare Other | Admitting: Cardiology

## 2018-06-20 ENCOUNTER — Encounter: Payer: Self-pay | Admitting: Cardiology

## 2018-06-20 VITALS — BP 139/72 | HR 61 | Ht 61.0 in | Wt 181.0 lb

## 2018-06-20 DIAGNOSIS — I350 Nonrheumatic aortic (valve) stenosis: Secondary | ICD-10-CM

## 2018-06-20 DIAGNOSIS — I10 Essential (primary) hypertension: Secondary | ICD-10-CM | POA: Diagnosis not present

## 2018-06-20 DIAGNOSIS — Z952 Presence of prosthetic heart valve: Secondary | ICD-10-CM

## 2018-06-20 DIAGNOSIS — E7800 Pure hypercholesterolemia, unspecified: Secondary | ICD-10-CM | POA: Diagnosis not present

## 2018-06-20 DIAGNOSIS — I69322 Dysarthria following cerebral infarction: Secondary | ICD-10-CM | POA: Diagnosis not present

## 2018-06-20 DIAGNOSIS — I5033 Acute on chronic diastolic (congestive) heart failure: Secondary | ICD-10-CM | POA: Diagnosis not present

## 2018-06-20 DIAGNOSIS — I5032 Chronic diastolic (congestive) heart failure: Secondary | ICD-10-CM | POA: Diagnosis not present

## 2018-06-20 DIAGNOSIS — I442 Atrioventricular block, complete: Secondary | ICD-10-CM

## 2018-06-20 DIAGNOSIS — I69331 Monoplegia of upper limb following cerebral infarction affecting right dominant side: Secondary | ICD-10-CM | POA: Diagnosis not present

## 2018-06-20 DIAGNOSIS — I110 Hypertensive heart disease with heart failure: Secondary | ICD-10-CM | POA: Diagnosis not present

## 2018-06-20 DIAGNOSIS — N390 Urinary tract infection, site not specified: Secondary | ICD-10-CM | POA: Diagnosis not present

## 2018-06-20 DIAGNOSIS — I4510 Unspecified right bundle-branch block: Secondary | ICD-10-CM

## 2018-06-20 MED ORDER — SIMVASTATIN 40 MG PO TABS: 40.0000 mg | ORAL_TABLET | Freq: Every day | ORAL | 3 refills | Status: DC

## 2018-06-20 NOTE — Patient Instructions (Addendum)
Medication Instructions:  Your physician has recommended you make the following change in your medication:  Decrease Simvastatin 40 mg by mouth daily.  If you need a refill on your cardiac medications before your next appointment, please call your pharmacy.   Lab work: Your physician recommends that you return for lab work in: 6 weeks for fasting lipid and liver panel.  If you have labs (blood work) drawn today and your tests are completely normal, you will receive your results only by: [name] MyChart Message (if you have MyChart) OR . A paper copy in the mail If you have any lab test that is abnormal or we need to change your treatment, we will call you to review the results.  Testing/Procedures: None ordered today.  Follow-Up: At [facility], you and your health needs are our priority.  As part of our continuing mission to provide you with exceptional heart care, we have created designated Provider Care Teams.  These Care Teams include your primary Cardiologist (physician) and Advanced Practice Providers (APPs -  Physician Assistants and Nurse Practitioners) who all work together to provide you with the care you need, when you need it. You will need a follow up appointment in 6 months.  Please call our office 2 months in advance to schedule this appointment.  You may see Dr. Radford Pax or one of the following Advanced Practice Providers on your designated Care Team:   [place], PA-C Melina Copa, PA-C . Ermalinda Barrios, PA-C

## 2018-06-21 ENCOUNTER — Encounter: Payer: Self-pay | Admitting: Genetic Counselor

## 2018-06-21 DIAGNOSIS — I5033 Acute on chronic diastolic (congestive) heart failure: Secondary | ICD-10-CM | POA: Diagnosis not present

## 2018-06-21 DIAGNOSIS — I69331 Monoplegia of upper limb following cerebral infarction affecting right dominant side: Secondary | ICD-10-CM | POA: Diagnosis not present

## 2018-06-21 DIAGNOSIS — I350 Nonrheumatic aortic (valve) stenosis: Secondary | ICD-10-CM | POA: Diagnosis not present

## 2018-06-21 DIAGNOSIS — I69322 Dysarthria following cerebral infarction: Secondary | ICD-10-CM | POA: Diagnosis not present

## 2018-06-21 DIAGNOSIS — N390 Urinary tract infection, site not specified: Secondary | ICD-10-CM | POA: Diagnosis not present

## 2018-06-21 DIAGNOSIS — I110 Hypertensive heart disease with heart failure: Secondary | ICD-10-CM | POA: Diagnosis not present

## 2018-06-21 NOTE — Progress Notes (Signed)
[facility]  HEMATOLOGY-ONCOLOGY TeleHEALTH VISIT PROGRESS NOTE   I connected with Julie Mcgee on 06/21/18 at 11:30 AM EDT by telephone visit and verified that I am speaking with the correct person using two identifiers.   This visit was intended to be initiated using video, but the video component failed, leaving only telephone evaluation.   I discussed the limitations, risks, security and privacy concerns of performing an evaluation and management service by telemedicine and the availability of in-person appointments. I also discussed with the patient that there may be a patient responsible charge related to this service. The patient expressed understanding and agreed to proceed.   Other persons participating in the visit and their role in the encounter: patient's son  Patient's location: assisted living facility  Provider's location: clinic  Patient Care Team: Masneri, Adele Barthel, DO as PCP - General (Family Medicine) Revankar, Reita Cliche, MD as PCP - Cardiology (Cardiology) Sherren Mocha, MD as PCP - Structural Heart (Cardiology) Evans Lance, MD as PCP - Electrophysiology (Cardiology) Rosalin Hawking, MD as Consulting Physician (Neurology) Cordelia Poche, RN as Oncology Nurse Navigator Tish Men, MD as Medical Oncologist (Oncology)  HEME/ONC OVERVIEW: 1. 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 Incidental R breast mass -05/2018: staging scans showed a 2.6cm lobulated mass in the R central breast, as well as enlarged mediastinal and hilar LN's (largest pre-tracheal LN 1.9cm), stable since 02/2018  TREATMENT REGIMEN:  05/24/2018: right hemicolectomy and ileal resection   PERTINENT  NON-HEM/ONC PROBLEMS: 1. Severe aortic stenosis s/p TAVR in 02/2018  -Hospitalization complicated by complete heart block and brief asystolic requiring CPR and inotrope; permanent pacemaker placed during the hospitalization  2. Chronic HFpEF 3. Hx of CVA with residual R hemiparesis   ASSESSMENT & PLAN:   Incidental R breast mass -I discussed with the patient at length regarding the work-up for the breast mass, including MMG/US and likely biopsy -Furthermore, if the breast bx confirms malignancy, she may also need to have PET to assess the mediastinal and hilar LN"s, and if they are abnormal, bronch w/ EBUS and bx of the abnormal LN to evaluate for possible metastatic disease -After lengthy discussions, the patient declined further work-up, including MMG/US, for at least a few more weeks  -I emphasized with the patient that while ultimately she would decide if she would like to pursue further work-up, the longer we delay the work-up for the right breast mass, the greater the possibility of cancer spread (if the breast lesion is proven to be malignant); patient expressed understanding  -I also contacted the patient's son separately to inform him of the patient decision, including the potential risks of delaying the work-up; patient's son expressed understanding, and will discuss this further with his mother and contact us next week to further clarify the goals of care  Stage I adenocarcinoma of the ascending colon  -S/p right hemicolectomy and ileal resection in 05/2018 -No indication for adjuvant chemotherapy or radiation -Colonoscopy 1 year after surgery, and based on endoscopic findings, the frequency of colonoscopy can be determined -Continue follow-up with gastroenterology  Microsatellite instability -Genetic counseling scheduled on 07/12/2018  -Given the BRAF V600E mutation, the MSI-I is likely due to MLH-1 gene promoter hypermethylation instead of germline mutation, but I will defer further  evaluation  to the genetics team  No orders of the defined types were placed in this encounter.  I discussed the assessment and treatment plan with the patient. The patient was provided an opportunity to ask questions and all were answered. The patient agreed with the plan and demonstrated an understanding of the instructions.   The patient was advised to call back or seek an in-person evaluation if the symptoms worsen or if the condition fails to improve as anticipated.   I provided 25 minutes of non face-to-face telephone visit time during this encounter, and > 50% was spent counseling as documented under my assessment & plan.   Return to be determined, pending the patient and her family's decision  Tish Men, MD 06/21/2018 1:45 PM   CHIEF COMPLAINTS:  "I am doing fine"  INTERVAL HISTORY: [name] was scheduled for follow-up of R breast lesion via virtual visit.  This visit was intended to be initiated using video, but the video component failed, leaving only telephone evaluation.   Patient reports that she has been doing relatively well since the recent surgery, and has not had any problem with wound healing. She is eating and drinking without any difficulty.  She denies any other new complaint since the last visit.   I have reviewed her chart and materials related to her cancer extensively and collaborated history with the patient. Summary of oncologic history is as follows:   Malignant neoplasm of ascending colon [facility])    Initial Diagnosis    Malignant neoplasm of ascending colon ([place])    05/19/2018 Procedure    Colonoscopy: - Likely malignant tumor in the ascending colon. Biopsied. Tattooed. This is the source of the anemia. - A few 2 to 5 mm polyps in the rectum, in the sigmoid colon and in the ascending colon. - Non-bleeding internal hemorrhoids. - The examined portion of the ileum was normal.    05/19/2018 Pathology Results    1. Duodenum, Biopsy - BENIGN SMALL BOWEL  MUCOSA. - NO VILLOUS BLUNTING OR INCREASE IN INTRAEPITHELIAL LYMPHOCYTES. - NO DYSPLASIA OR MALIGNANCY. 2. Stomach, polyp(s) - FUNDIC GLAND POLYP. - WARTHIN-STARRY IS NEGATIVE FOR HELICOBACTER PYLORI. - NO INTESTINAL METAPLASIA, DYSPLASIA, OR MALIGNANCY. 3. Colon, biopsy, ascending - INVASIVE ADENOCARCINOMA.    05/20/2018 Imaging    CT CAP: IMPRESSION: 1. No CT findings of the abdomen or pelvis to localize suspected GI bleeding. No intraluminal contrast noted.  2. Focal soft tissue thickening of the colon wall superior to the ileocecal valve may reflect reported ascending colon mass.  3. There is extensive fatty mural stratification of the cecum, suggesting chronic inflammatory sequelae of colitis, including Crohn's disease.  4. Moderate right, small left pleural effusions and associated atelectasis or consolidation. There is mild, diffuse interlobular septal thickening, most consistent with edema.  5.  Interval placement of aortic valve stent endograft.  6. There is a lobulated 2.6 cm mass in the central right breast (series 3, image 39), as seen on prior examination. Correlate with mammography.  7.  Other chronic and incidental findings as detailed above.    05/24/2018 Pathology Results    Accession: [id number]  Procedure: Right colon and ileum resection. Tumor Site: Proximal right colon. Tumor Size: 2.5 cm. Macroscopic Tumor Perforation: Not identified. Histologic Type: Invasive adenocarcinoma. Histologic Grade: G2, moderately differentiated. Tumor Extension: Into submucosa. Margins: Negative. Treatment Effect: N/A. Lymphovascular Invasion: N/A. Perineural Invasion: N/A. Tumor Deposits: Not identified. Regional Lymph Nodes: Number of Lymph Nodes Involved: 0 Number of Lymph Nodes Examined: 18 Pathologic  Stage Classification (pTNM, AJCC 8th Edition): pT1, pN0 Ancillary Studies: MSI will be ordered. MMR was performed on the biopsy and showed loss of MLH1 and  PMS2.  MSI-high, BRAF V600E mutation present     05/24/2018 Cancer Staging    Staging form: Colon and Rectum, AJCC 8th Edition - Pathologic stage from 05/24/2018: Stage I (pT1, pN0, cM0) - Signed by Tish Men, MD on 06/07/2018     MEDICAL HISTORY:  Past Medical History:  Diagnosis Date  . Arthritis   . Carotid artery stenosis   . Chronic diastolic CHF (congestive heart failure) ([place])   . Former smoker    1/2 ppd for 45 years  . 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 Hyperlipidemia   . Hypertension   . Hypothyroidism   . Peripheral vascular disease ([place])   . RBBB   . 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 Severe aortic stenosis     SURGICAL HISTORY: Past Surgical History:  Procedure Laterality Date  . BASAL CELL CARCINOMA EXCISION  2018  . BIOPSY  05/19/2018   Procedure: BIOPSY;  Surgeon: Lavena Bullion, DO;  Location: [facility];  Service: Gastroenterology;;  . CARDIAC CATHETERIZATION    . COLONOSCOPY  2001  . COLONOSCOPY WITH PROPOFOL N/A 05/19/2018   Procedure: COLONOSCOPY WITH PROPOFOL;  Surgeon: Lavena Bullion, DO;  Location: [place];  Service: Gastroenterology;  Laterality: N/A;  . ESOPHAGOGASTRODUODENOSCOPY (EGD) WITH PROPOFOL N/A 05/19/2018   Procedure: ESOPHAGOGASTRODUODENOSCOPY (EGD) WITH PROPOFOL;  Surgeon: Lavena Bullion, DO;  Location: [place];  Service: Gastroenterology;  Laterality: N/A;  . HIP SURGERY  2005  . KNEE SURGERY  1998  . LAPAROSCOPIC PARTIAL COLECTOMY N/A 05/24/2018   Procedure: LAPAROSCOPIC ASSISTED ASCENDING HEMICOLECTOMY;  Surgeon: Ileana Roup, MD;  Location: [place];  Service: General;  Laterality: N/A;  . PACEMAKER IMPLANT N/A 03/02/2018   Procedure: PACEMAKER IMPLANT;  Surgeon: Evans Lance, MD;  Location: [place] [facility];  Service:  Cardiovascular;  Laterality: N/A;  . RIGHT/LEFT HEART CATH AND CORONARY ANGIOGRAPHY N/A 02/21/2018   Procedure: RIGHT/LEFT HEART CATH AND CORONARY ANGIOGRAPHY;  Surgeon: Sherren Mocha, MD;  Location: [place] [facility];  Service: Cardiovascular;  Laterality: N/A;  . SUBMUCOSAL TATTOO INJECTION  05/19/2018   Procedure: SUBMUCOSAL TATTOO INJECTION;  Surgeon: Lavena Bullion, DO;  Location: [place];  Service: Gastroenterology;;  . TEE WITHOUT CARDIOVERSION N/A 02/28/2018   Procedure: TRANSESOPHAGEAL ECHOCARDIOGRAM (TEE);  Surgeon: Sherren Mocha, MD;  Location: [place] [facility];  Service: Open Heart Surgery;  Laterality: N/A;  . TEMPORARY PACEMAKER N/A 03/01/2018   Procedure: TEMPORARY PACEMAKER;  Surgeon: Burnell Blanks, MD;  Location: [place] [facility];  Service: Cardiovascular;  Laterality: N/A;  . TONSILLECTOMY    . TRANSCATHETER AORTIC VALVE REPLACEMENT, TRANSFEMORAL N/A 02/28/2018   Procedure: TRANSCATHETER AORTIC VALVE REPLACEMENT, TRANSFEMORAL;  Surgeon: Sherren Mocha, MD;  Location: [place] [facility];  Service: Open Heart Surgery;  Laterality: N/A;    SOCIAL HISTORY: Social History   Socioeconomic History  . Marital status: Married    Spouse name: Not on file  . Number of children: Not on file  . Years of education: Not on file  . Highest education level: Not on file  Occupational History  . Not on file  Social Needs  . Financial resource strain: Not on file  .  Food insecurity:    Worry: Not on file    Inability: Not on file  . Transportation needs:    Medical: Not on file    Non-medical: Not on file  Tobacco Use  . Smoking status: Former Smoker    Packs/day: 1.00    Years: 45.00    Pack years: 45.00    Types: Cigarettes    Last attempt to quit: 05/23/2009    Years since quitting: 9.0  . Smokeless tobacco: Never Used  Substance and Sexual Activity  . Alcohol use: Not Currently  . Drug use: Never  . Sexual activity: Not on file  Lifestyle  .  Physical activity:    Days per week: Not on file    Minutes per session: Not on file  . Stress: Not on file  Relationships  . Social connections:    Talks on phone: Not on file    Gets together: Not on file    Attends religious service: Not on file    Active member of club or organization: Not on file    Attends meetings of clubs or organizations: Not on file    Relationship status: Not on file  . Intimate partner violence:    Fear of current or ex partner: Not on file    Emotionally abused: Not on file    Physically abused: Not on file    Forced sexual activity: Not on file  Other Topics Concern  . Not on file  Social History Narrative  . Not on file    FAMILY HISTORY: Family History  Problem Relation Age of Onset  . Heart failure Mother   . Chronic Renal Failure Mother   . Alcohol abuse Father     ALLERGIES:  is allergic to adhesive [tape].  MEDICATIONS:  Current Outpatient Medications  Medication Sig Dispense Refill  . amoxicillin (AMOXIL) 500 MG tablet Please take 2,000 mg (4 tablets) one hour prior to all dental procedures. 8 tablet 8  . aspirin EC 81 MG tablet Take 1 tablet (81 mg total) by mouth daily.    . Calcium Carb-Cholecalciferol (CALTRATE 600+D3 PO) Take 1 tablet by mouth 2 (two) times daily.    . clopidogrel (PLAVIX) 75 MG tablet Take 1 tablet (75 mg total) by mouth daily with breakfast. 90 tablet 1  . diphenhydrAMINE (BENADRYL) 25 MG tablet Take 25 mg by mouth every 6 (six) hours as needed.    . ferrous sulfate 325 (65 FE) MG tablet Take 1 tablet (325 mg total) by mouth every other day. 15 tablet 0  . furosemide (LASIX) 20 MG tablet Take 1 tablet (20 mg total) by mouth daily. 90 tablet 3  . levothyroxine (SYNTHROID) 100 MCG tablet Take 100 mcg by mouth daily before breakfast.     . Melatonin 5 MG CAPS Take 5 mg by mouth at bedtime as needed (sleep).     . metoprolol tartrate (LOPRESSOR) 50 MG tablet Take 50 mg by mouth 2 (two) times daily.    . Multiple  Vitamins-Minerals (MULTIVITAMIN ADULTS 50+) TABS Take 1 tablet by mouth daily.    . pantoprazole (PROTONIX) 40 MG tablet Take 1 tablet (40 mg total) by mouth daily. 30 tablet 0  . saccharomyces boulardii (FLORASTOR) 250 MG capsule Take 250 mg by mouth 2 (two) times daily.    . simvastatin (ZOCOR) 40 MG tablet Take 1 tablet (40 mg total) by mouth at bedtime. 90 tablet 3   No current facility-administered medications for this visit.  REVIEW OF SYSTEMS:   Constitutional: ( - ) fevers, ( - )  chills , ( - ) night sweats Eyes: ( - ) blurriness of vision, ( - ) double vision, ( - ) watery eyes Ears, nose, mouth, throat, and face: ( - ) mucositis, ( - ) sore throat Respiratory: ( - ) cough, ( - ) dyspnea, ( - ) wheezes Cardiovascular: ( - ) palpitation, ( - ) chest discomfort, ( - ) lower extremity swelling Gastrointestinal:  ( - ) nausea, ( - ) heartburn, ( - ) change in bowel habits Skin: ( - ) abnormal skin rashes Lymphatics: ( - ) new lymphadenopathy, ( - ) easy bruising Neurological: ( - ) numbness, ( - ) tingling, ( - ) new weaknesses Behavioral/Psych: ( - ) mood change, ( - ) new changes  All other systems were reviewed with the patient and are negative.  PHYSICAL EXAMINATION: Unable to examine the patient due to telephone visit.  LABORATORY DATA:  I have reviewed the data as listed Lab Results  Component Value Date   WBC 9.2 05/27/2018   HGB 7.1 (L) 05/27/2018   HCT 24.1 (L) 05/27/2018   MCV 85.2 05/27/2018   PLT 276 05/27/2018   Lab Results  Component Value Date   NA 136 05/26/2018   K 4.3 05/26/2018   CL 103 05/26/2018   CO2 26 05/26/2018    RADIOGRAPHIC STUDIES: I have personally reviewed the radiological images as listed and agreed with the findings in the report. No results found.  PATHOLOGY: I have reviewed the pathology reports as documented in the oncologist history.

## 2018-06-22 ENCOUNTER — Inpatient Hospital Stay: Payer: Medicare Other | Attending: Hematology | Admitting: Hematology

## 2018-06-22 ENCOUNTER — Telehealth: Payer: Self-pay | Admitting: Hematology

## 2018-06-22 ENCOUNTER — Other Ambulatory Visit: Payer: Self-pay

## 2018-06-22 ENCOUNTER — Encounter: Payer: Self-pay | Admitting: Hematology

## 2018-06-22 ENCOUNTER — Telehealth: Payer: Self-pay | Admitting: Emergency Medicine

## 2018-06-22 DIAGNOSIS — Z7982 Long term (current) use of aspirin: Secondary | ICD-10-CM

## 2018-06-22 DIAGNOSIS — E039 Hypothyroidism, unspecified: Secondary | ICD-10-CM | POA: Diagnosis not present

## 2018-06-22 DIAGNOSIS — C182 Malignant neoplasm of ascending colon: Secondary | ICD-10-CM | POA: Diagnosis not present

## 2018-06-22 DIAGNOSIS — Z87891 Personal history of nicotine dependence: Secondary | ICD-10-CM | POA: Diagnosis not present

## 2018-06-22 DIAGNOSIS — Z79899 Other long term (current) drug therapy: Secondary | ICD-10-CM

## 2018-06-22 DIAGNOSIS — N649 Disorder of breast, unspecified: Secondary | ICD-10-CM

## 2018-06-22 DIAGNOSIS — I10 Essential (primary) hypertension: Secondary | ICD-10-CM

## 2018-06-22 NOTE — Telephone Encounter (Signed)
Received VM from patient's son [name] at around 0930 this am, RN unable to get to VM inbox for MD Maylon Peppers until around 1115.  Per [name] the patient would likely not be able to participate in her scheduled wed-ex appt this morning with MD Maylon Peppers d/t the son not being available to help her log in.  [name] requested that instead the patient be contacted for a phone visit today instead at [phone].  MD Maylon Peppers made aware, states he has spoke with both the patient and her son by phone this morning.

## 2018-06-22 NOTE — Telephone Encounter (Signed)
To be determined per 6/11 los

## 2018-06-23 DIAGNOSIS — I69322 Dysarthria following cerebral infarction: Secondary | ICD-10-CM | POA: Diagnosis not present

## 2018-06-23 DIAGNOSIS — I350 Nonrheumatic aortic (valve) stenosis: Secondary | ICD-10-CM | POA: Diagnosis not present

## 2018-06-23 DIAGNOSIS — D62 Acute posthemorrhagic anemia: Secondary | ICD-10-CM | POA: Diagnosis not present

## 2018-06-23 DIAGNOSIS — I872 Venous insufficiency (chronic) (peripheral): Secondary | ICD-10-CM | POA: Diagnosis not present

## 2018-06-23 DIAGNOSIS — I69331 Monoplegia of upper limb following cerebral infarction affecting right dominant side: Secondary | ICD-10-CM | POA: Diagnosis not present

## 2018-06-23 DIAGNOSIS — Z950 Presence of cardiac pacemaker: Secondary | ICD-10-CM | POA: Diagnosis not present

## 2018-06-23 DIAGNOSIS — L22 Diaper dermatitis: Secondary | ICD-10-CM | POA: Diagnosis not present

## 2018-06-23 DIAGNOSIS — Z87891 Personal history of nicotine dependence: Secondary | ICD-10-CM | POA: Diagnosis not present

## 2018-06-23 DIAGNOSIS — K922 Gastrointestinal hemorrhage, unspecified: Secondary | ICD-10-CM | POA: Diagnosis not present

## 2018-06-23 DIAGNOSIS — I110 Hypertensive heart disease with heart failure: Secondary | ICD-10-CM | POA: Diagnosis not present

## 2018-06-23 DIAGNOSIS — I5032 Chronic diastolic (congestive) heart failure: Secondary | ICD-10-CM | POA: Diagnosis not present

## 2018-06-23 DIAGNOSIS — R2681 Unsteadiness on feet: Secondary | ICD-10-CM | POA: Diagnosis not present

## 2018-06-26 DIAGNOSIS — D62 Acute posthemorrhagic anemia: Secondary | ICD-10-CM | POA: Diagnosis not present

## 2018-06-26 DIAGNOSIS — I5032 Chronic diastolic (congestive) heart failure: Secondary | ICD-10-CM | POA: Diagnosis not present

## 2018-06-26 DIAGNOSIS — I69331 Monoplegia of upper limb following cerebral infarction affecting right dominant side: Secondary | ICD-10-CM | POA: Diagnosis not present

## 2018-06-26 DIAGNOSIS — K922 Gastrointestinal hemorrhage, unspecified: Secondary | ICD-10-CM | POA: Diagnosis not present

## 2018-06-26 DIAGNOSIS — I110 Hypertensive heart disease with heart failure: Secondary | ICD-10-CM | POA: Diagnosis not present

## 2018-06-26 DIAGNOSIS — I350 Nonrheumatic aortic (valve) stenosis: Secondary | ICD-10-CM | POA: Diagnosis not present

## 2018-06-27 ENCOUNTER — Telehealth: Payer: Self-pay

## 2018-06-27 NOTE — Telephone Encounter (Signed)
Called [facility], they stated they would need orders faxed to [phone]. Sent orders by [employer] fax function. Awaiting confirmation.

## 2018-06-27 NOTE — Telephone Encounter (Signed)
-----   Message from Sarina Ill, RN sent at 06/21/2018 11:34 AM EDT ----- Left message to call back.  ----- Message ----- From: Sueanne Margarita, MD Sent: 06/21/2018   9:23 AM EDT To: Sarina Ill, RN  Home healthagency - call patient to get info ----- Message ----- From: Sarina Ill, RN Sent: 06/21/2018   8:57 AM EDT To: Sueanne Margarita, MD  What is [facility]?  ----- Message ----- From: Michaelyn Barter, RN Sent: 06/20/2018   9:33 AM EDT To: Sarina Ill, RN  Never done this before, so I will leave this one up to you. Needs labs in 6 weeks.  Thanks, Pam ----- Message ----- From: Sueanne Margarita, MD Sent: 06/20/2018   9:30 AM EDT To: Michaelyn Barter, RN  Pam patient needs [facility] to go to her independent living to get her lipids drawn

## 2018-06-27 NOTE — Telephone Encounter (Signed)
Left message for the patient to call back.

## 2018-06-30 ENCOUNTER — Encounter: Payer: Self-pay | Admitting: Thoracic Surgery (Cardiothoracic Vascular Surgery)

## 2018-07-03 DIAGNOSIS — D62 Acute posthemorrhagic anemia: Secondary | ICD-10-CM | POA: Diagnosis not present

## 2018-07-03 DIAGNOSIS — K922 Gastrointestinal hemorrhage, unspecified: Secondary | ICD-10-CM | POA: Diagnosis not present

## 2018-07-03 DIAGNOSIS — I110 Hypertensive heart disease with heart failure: Secondary | ICD-10-CM | POA: Diagnosis not present

## 2018-07-03 DIAGNOSIS — I350 Nonrheumatic aortic (valve) stenosis: Secondary | ICD-10-CM | POA: Diagnosis not present

## 2018-07-03 DIAGNOSIS — I5032 Chronic diastolic (congestive) heart failure: Secondary | ICD-10-CM | POA: Diagnosis not present

## 2018-07-03 DIAGNOSIS — I69331 Monoplegia of upper limb following cerebral infarction affecting right dominant side: Secondary | ICD-10-CM | POA: Diagnosis not present

## 2018-07-03 NOTE — Telephone Encounter (Signed)
Spoke with the patient, the labs are scheduled in July.

## 2018-07-10 DIAGNOSIS — I5032 Chronic diastolic (congestive) heart failure: Secondary | ICD-10-CM | POA: Diagnosis not present

## 2018-07-10 DIAGNOSIS — I110 Hypertensive heart disease with heart failure: Secondary | ICD-10-CM | POA: Diagnosis not present

## 2018-07-10 DIAGNOSIS — I350 Nonrheumatic aortic (valve) stenosis: Secondary | ICD-10-CM | POA: Diagnosis not present

## 2018-07-10 DIAGNOSIS — I69331 Monoplegia of upper limb following cerebral infarction affecting right dominant side: Secondary | ICD-10-CM | POA: Diagnosis not present

## 2018-07-10 DIAGNOSIS — D62 Acute posthemorrhagic anemia: Secondary | ICD-10-CM | POA: Diagnosis not present

## 2018-07-10 DIAGNOSIS — K922 Gastrointestinal hemorrhage, unspecified: Secondary | ICD-10-CM | POA: Diagnosis not present

## 2018-07-12 ENCOUNTER — Inpatient Hospital Stay: Payer: Medicare Other | Admitting: Genetic Counselor

## 2018-07-12 ENCOUNTER — Inpatient Hospital Stay: Payer: Medicare Other

## 2018-07-19 ENCOUNTER — Encounter: Payer: Medicare Other | Admitting: Genetic Counselor

## 2018-07-21 ENCOUNTER — Telehealth: Payer: Medicare Other | Admitting: Cardiology

## 2018-07-23 DIAGNOSIS — R2681 Unsteadiness on feet: Secondary | ICD-10-CM | POA: Diagnosis not present

## 2018-07-23 DIAGNOSIS — I350 Nonrheumatic aortic (valve) stenosis: Secondary | ICD-10-CM | POA: Diagnosis not present

## 2018-07-23 DIAGNOSIS — L22 Diaper dermatitis: Secondary | ICD-10-CM | POA: Diagnosis not present

## 2018-07-23 DIAGNOSIS — Z87891 Personal history of nicotine dependence: Secondary | ICD-10-CM | POA: Diagnosis not present

## 2018-07-23 DIAGNOSIS — I5032 Chronic diastolic (congestive) heart failure: Secondary | ICD-10-CM | POA: Diagnosis not present

## 2018-07-23 DIAGNOSIS — Z950 Presence of cardiac pacemaker: Secondary | ICD-10-CM | POA: Diagnosis not present

## 2018-07-23 DIAGNOSIS — D62 Acute posthemorrhagic anemia: Secondary | ICD-10-CM | POA: Diagnosis not present

## 2018-07-23 DIAGNOSIS — I69322 Dysarthria following cerebral infarction: Secondary | ICD-10-CM | POA: Diagnosis not present

## 2018-07-23 DIAGNOSIS — K922 Gastrointestinal hemorrhage, unspecified: Secondary | ICD-10-CM | POA: Diagnosis not present

## 2018-07-23 DIAGNOSIS — I872 Venous insufficiency (chronic) (peripheral): Secondary | ICD-10-CM | POA: Diagnosis not present

## 2018-07-23 DIAGNOSIS — I69331 Monoplegia of upper limb following cerebral infarction affecting right dominant side: Secondary | ICD-10-CM | POA: Diagnosis not present

## 2018-07-23 DIAGNOSIS — I110 Hypertensive heart disease with heart failure: Secondary | ICD-10-CM | POA: Diagnosis not present

## 2018-07-24 DIAGNOSIS — I5032 Chronic diastolic (congestive) heart failure: Secondary | ICD-10-CM | POA: Diagnosis not present

## 2018-07-24 DIAGNOSIS — I350 Nonrheumatic aortic (valve) stenosis: Secondary | ICD-10-CM | POA: Diagnosis not present

## 2018-07-24 DIAGNOSIS — I69331 Monoplegia of upper limb following cerebral infarction affecting right dominant side: Secondary | ICD-10-CM | POA: Diagnosis not present

## 2018-07-24 DIAGNOSIS — I110 Hypertensive heart disease with heart failure: Secondary | ICD-10-CM | POA: Diagnosis not present

## 2018-07-24 DIAGNOSIS — K922 Gastrointestinal hemorrhage, unspecified: Secondary | ICD-10-CM | POA: Diagnosis not present

## 2018-07-24 DIAGNOSIS — D62 Acute posthemorrhagic anemia: Secondary | ICD-10-CM | POA: Diagnosis not present

## 2018-07-27 ENCOUNTER — Ambulatory Visit (INDEPENDENT_AMBULATORY_CARE_PROVIDER_SITE_OTHER): Payer: Medicare Other | Admitting: Podiatry

## 2018-07-27 ENCOUNTER — Encounter: Payer: Self-pay | Admitting: Podiatry

## 2018-07-27 ENCOUNTER — Other Ambulatory Visit: Payer: Self-pay

## 2018-07-27 VITALS — BP 170/87 | HR 66 | Temp 97.8°F | Resp 16

## 2018-07-27 DIAGNOSIS — B351 Tinea unguium: Secondary | ICD-10-CM | POA: Diagnosis not present

## 2018-07-27 DIAGNOSIS — D689 Coagulation defect, unspecified: Secondary | ICD-10-CM | POA: Diagnosis not present

## 2018-07-27 DIAGNOSIS — M79676 Pain in unspecified toe(s): Secondary | ICD-10-CM

## 2018-07-29 NOTE — Progress Notes (Signed)
Subjective:  Patient ID: Julie Mcgee, female    DOB: June 12, 1933,  MRN: [id number] HPI Chief Complaint  Patient presents with  . Debridement    Requesting nail trim, concerned about fungus on right hallux  . New Patient (Initial Visit)    85 y.o. female presents with the above complaint.   ROS: Denies fever chills nausea vomiting muscle aches pains calf pain back pain chest pain shortness of breath.  Past Medical History:  Diagnosis Date  . Arthritis   . Carotid artery stenosis   . Chronic diastolic CHF (congestive heart failure) ([place])   . Former smoker    1/2 ppd for 45 years  . 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 Hyperlipidemia   . Hypertension   . Hypothyroidism   . Peripheral vascular disease ([place])   . RBBB   . 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 Severe aortic stenosis    Past Surgical History:  Procedure Laterality Date  . BASAL CELL CARCINOMA EXCISION  2018  . BIOPSY  05/19/2018   Procedure: BIOPSY;  Surgeon: Lavena Bullion, DO;  Location: [facility];  Service: Gastroenterology;;  . CARDIAC CATHETERIZATION    . COLONOSCOPY  2001  . COLONOSCOPY WITH PROPOFOL N/A 05/19/2018   Procedure: COLONOSCOPY WITH PROPOFOL;  Surgeon: Lavena Bullion, DO;  Location: [facility];  Service: Gastroenterology;  Laterality: N/A;  . ESOPHAGOGASTRODUODENOSCOPY (EGD) WITH PROPOFOL N/A 05/19/2018   Procedure: ESOPHAGOGASTRODUODENOSCOPY (EGD) WITH PROPOFOL;  Surgeon: Lavena Bullion, DO;  Location: [facility];  Service: Gastroenterology;  Laterality: N/A;  . HIP SURGERY  2005  . KNEE SURGERY  1998  . LAPAROSCOPIC PARTIAL COLECTOMY N/A 05/24/2018   Procedure: LAPAROSCOPIC ASSISTED ASCENDING HEMICOLECTOMY;  Surgeon: Ileana Roup, MD;  Location: [place];  Service: General;  Laterality: N/A;  . PACEMAKER  IMPLANT N/A 03/02/2018   Procedure: PACEMAKER IMPLANT;  Surgeon: Evans Lance, MD;  Location: [facility] [facility];  Service: Cardiovascular;  Laterality: N/A;  . RIGHT/LEFT HEART CATH AND CORONARY ANGIOGRAPHY N/A 02/21/2018   Procedure: RIGHT/LEFT HEART CATH AND CORONARY ANGIOGRAPHY;  Surgeon: Sherren Mocha, MD;  Location: [place] [facility];  Service: Cardiovascular;  Laterality: N/A;  . SUBMUCOSAL TATTOO INJECTION  05/19/2018   Procedure: SUBMUCOSAL TATTOO INJECTION;  Surgeon: Lavena Bullion, DO;  Location: [facility];  Service: Gastroenterology;;  . TEE WITHOUT CARDIOVERSION N/A 02/28/2018   Procedure: TRANSESOPHAGEAL ECHOCARDIOGRAM (TEE);  Surgeon: Sherren Mocha, MD;  Location: [facility];  Service: Open Heart Surgery;  Laterality: N/A;  . TEMPORARY PACEMAKER N/A 03/01/2018   Procedure: TEMPORARY PACEMAKER;  Surgeon: Burnell Blanks, MD;  Location: [facility];  Service: Cardiovascular;  Laterality: N/A;  . TONSILLECTOMY    . TRANSCATHETER AORTIC VALVE REPLACEMENT, TRANSFEMORAL N/A 02/28/2018   Procedure: TRANSCATHETER AORTIC VALVE REPLACEMENT, TRANSFEMORAL;  Surgeon: Sherren Mocha, MD;  Location: [facility];  Service: Open Heart Surgery;  Laterality: N/A;    Current Outpatient Medications:  .  amoxicillin (AMOXIL) 500 MG tablet, Please take 2,000 mg (4 tablets) one hour prior to all dental procedures., Disp: 8 tablet, Rfl: 8 .  aspirin EC 81 MG tablet, Take 1 tablet (81 mg total) by mouth daily., Disp: , Rfl:  .  Calcium Carb-Cholecalciferol (CALTRATE 600+D3 PO), Take 1 tablet by mouth 2 (two) times daily., Disp: , Rfl:  .  clopidogrel (PLAVIX) 75 MG tablet, Take 1 tablet (75 mg total) by mouth daily with breakfast., Disp: 90 tablet, Rfl: 1 .  diphenhydrAMINE (BENADRYL) 25 MG tablet, Take 25 mg by mouth every 6 (six) hours as needed., Disp: , Rfl:  .  ferrous sulfate 325 (65 FE) MG tablet, Take 1 tablet (325 mg total) by mouth every other day., Disp: 15  tablet, Rfl: 0 .  furosemide (LASIX) 20 MG tablet, Take 1 tablet (20 mg total) by mouth daily., Disp: 90 tablet, Rfl: 3 .  levothyroxine (SYNTHROID) 100 MCG tablet, Take 100 mcg by mouth daily before breakfast. , Disp: , Rfl:  .  Melatonin 5 MG CAPS, Take 5 mg by mouth at bedtime as needed (sleep). , Disp: , Rfl:  .  metoprolol tartrate (LOPRESSOR) 50 MG tablet, Take 50 mg by mouth 2 (two) times daily., Disp: , Rfl:  .  Multiple Vitamins-Minerals (MULTIVITAMIN ADULTS 50+) TABS, Take 1 tablet by mouth daily., Disp: , Rfl:  .  pantoprazole (PROTONIX) 40 MG tablet, Take 1 tablet (40 mg total) by mouth daily., Disp: 30 tablet, Rfl: 0 .  saccharomyces boulardii (FLORASTOR) 250 MG capsule, Take 250 mg by mouth 2 (two) times daily., Disp: , Rfl:  .  simvastatin (ZOCOR) 40 MG tablet, Take 1 tablet (40 mg total) by mouth at bedtime., Disp: 90 tablet, Rfl: 3  Allergies  Allergen Reactions  . Adhesive [Tape] Other (See Comments)    Causes BLISTERS   Review of Systems Objective:   Vitals:   07/27/18 1333  BP: (!) 170/87  Pulse: 66  Resp: 16  Temp: 97.8 F (36.6 C)    General: Well developed, nourished, in no acute distress, alert and oriented x3   Dermatological: Skin is warm, dry and supple bilateral. Nails x 10 are well maintained; remaining integument appears unremarkable at this time. There are no open sores, no preulcerative lesions, no rash or signs of infection present.  Toenails are thick yellow dystrophic possibly mycotic painful in nature as well as debridement.  Vascular: Dorsalis Pedis artery and Posterior Tibial artery pedal pulses are 2/4 bilateral with immedate capillary fill time. Pedal hair growth present. No varicosities and no lower extremity edema present bilateral.   Neruologic: Grossly intact via light touch bilateral. Vibratory intact via tuning fork bilateral. Protective threshold with Semmes Wienstein monofilament intact to all pedal sites bilateral. Patellar and  Achilles deep tendon reflexes 2+ bilateral. No Babinski or clonus noted bilateral.   Musculoskeletal: No gross boney pedal deformities bilateral. No pain, crepitus, or limitation noted with foot and ankle range of motion bilateral. Muscular strength 5/5 in all groups tested bilateral.  Gait: Unassisted, Nonantalgic.    Radiographs:  None taken  Assessment & Plan:   Assessment: Painful onychomycosis  Plan: Debridement of toenails 1 through 5 bilateral     Soffia Doshier T. [place], [place]

## 2018-07-31 ENCOUNTER — Other Ambulatory Visit: Payer: Medicare Other

## 2018-08-02 ENCOUNTER — Telehealth: Payer: Self-pay | Admitting: Physician Assistant

## 2018-08-02 DIAGNOSIS — I5032 Chronic diastolic (congestive) heart failure: Secondary | ICD-10-CM | POA: Diagnosis not present

## 2018-08-02 DIAGNOSIS — D62 Acute posthemorrhagic anemia: Secondary | ICD-10-CM | POA: Diagnosis not present

## 2018-08-02 DIAGNOSIS — I350 Nonrheumatic aortic (valve) stenosis: Secondary | ICD-10-CM | POA: Diagnosis not present

## 2018-08-02 DIAGNOSIS — I69331 Monoplegia of upper limb following cerebral infarction affecting right dominant side: Secondary | ICD-10-CM | POA: Diagnosis not present

## 2018-08-02 DIAGNOSIS — I110 Hypertensive heart disease with heart failure: Secondary | ICD-10-CM | POA: Diagnosis not present

## 2018-08-02 DIAGNOSIS — K922 Gastrointestinal hemorrhage, unspecified: Secondary | ICD-10-CM | POA: Diagnosis not present

## 2018-08-02 MED ORDER — FUROSEMIDE 20 MG PO TABS: 20.0000 mg | ORAL_TABLET | Freq: Every day | ORAL | 3 refills | Status: DC | PRN

## 2018-08-02 NOTE — Telephone Encounter (Signed)
Spoke with Julie Mcgee and made her aware of recommendations regarding Plavix per K. Grandville [name], [place]. Per Dr. Theodosia Blender last note from 6/9 virtual visit, patient is to take furosemide 20 mg daily. Julie Mcgee verbalized understanding and states lab work was done today per order from Dr. Radford Pax. She thanked me for the call.

## 2018-08-02 NOTE — Telephone Encounter (Signed)
She can stop her plavix ~ 6 months after the procedure. I usually tell them to continue their pills until they run out (I give them one 90 day supply with one refill. )  KT

## 2018-08-02 NOTE — Telephone Encounter (Signed)
Julie Mcgee, patient had complications from GI bleed and Plavix was stopped and restarted. Dr. Theodosia Blender last notes states patient will stop Plavix again after July. TAVR was 2/18 so just want to verify end date for patient.

## 2018-08-02 NOTE — Telephone Encounter (Signed)
New Message   Pt c/o medication issue:  1. Name of Medication: clopidogrel (PLAVIX) 75 MG tablet and furosemide (LASIX) 20 MG tablet     2. How are you currently taking this medication (dosage and times per day)?   3. Are you having a reaction (difficulty breathing--STAT)?   4. What is your medication issue? Moe with [facility] is calling to confirm if the patient should still be taking the Plavix and Lasix.

## 2018-08-08 DIAGNOSIS — D62 Acute posthemorrhagic anemia: Secondary | ICD-10-CM | POA: Diagnosis not present

## 2018-08-08 DIAGNOSIS — I350 Nonrheumatic aortic (valve) stenosis: Secondary | ICD-10-CM | POA: Diagnosis not present

## 2018-08-08 DIAGNOSIS — I5032 Chronic diastolic (congestive) heart failure: Secondary | ICD-10-CM | POA: Diagnosis not present

## 2018-08-08 DIAGNOSIS — K922 Gastrointestinal hemorrhage, unspecified: Secondary | ICD-10-CM | POA: Diagnosis not present

## 2018-08-08 DIAGNOSIS — I69331 Monoplegia of upper limb following cerebral infarction affecting right dominant side: Secondary | ICD-10-CM | POA: Diagnosis not present

## 2018-08-08 DIAGNOSIS — I110 Hypertensive heart disease with heart failure: Secondary | ICD-10-CM | POA: Diagnosis not present

## 2018-08-09 ENCOUNTER — Inpatient Hospital Stay: Payer: Medicare Other | Admitting: Genetic Counselor

## 2018-08-09 ENCOUNTER — Other Ambulatory Visit: Payer: Medicare Other

## 2018-08-14 ENCOUNTER — Telehealth: Payer: Self-pay | Admitting: Genetic Counselor

## 2018-08-14 NOTE — Telephone Encounter (Signed)
Called patient regarding upcoming Webex appointment, patient's son is notified and e-mail has been sent.

## 2018-08-15 ENCOUNTER — Ambulatory Visit (HOSPITAL_COMMUNITY): Payer: Medicare Other

## 2018-08-15 ENCOUNTER — Inpatient Hospital Stay: Payer: Medicare Other | Attending: Hematology | Admitting: Genetic Counselor

## 2018-08-15 ENCOUNTER — Encounter: Payer: Self-pay | Admitting: Genetic Counselor

## 2018-08-15 DIAGNOSIS — C182 Malignant neoplasm of ascending colon: Secondary | ICD-10-CM

## 2018-08-15 DIAGNOSIS — Z808 Family history of malignant neoplasm of other organs or systems: Secondary | ICD-10-CM | POA: Insufficient documentation

## 2018-08-15 NOTE — Progress Notes (Signed)
REFERRING PROVIDER: Tish Men, MD [place],  [place] [address]  PRIMARY PROVIDER:  Lyman Bishop, DO  PRIMARY REASON FOR VISIT:  1. Malignant neoplasm of ascending colon ([place])   2. Family history of skin cancer     I connected with Ms. Mentzel on 08/15/2018 at 1:00 pm EDT by Webex video conference and verified that I am speaking with the correct person using two identifiers.   Patient location: home Provider location: clinic   HISTORY OF PRESENT ILLNESS:   Ms. Brewbaker, a 85 y.o. female, was seen for a [place] cancer genetics consultation at the request of Dr. Maylon Peppers due to a personal history of colon cancer and a family history of skin cancer.  Ms. Deiter presents to clinic today to discuss the possibility of a hereditary predisposition to cancer, genetic testing, and to further clarify her future cancer risks, as well as potential cancer risks for family members.   In 2020, at the age of 12, Ms. Blassingame was diagnosed with adenocarcinoma of the ascending colon. The tumor was MSI-High and had abnormal IHC staining with loss of MLH1 and PMS2 proteins. Further testing revealed the presence of a BRAF V600E mutation.   CANCER HISTORY:  Oncology History  Malignant neoplasm of ascending colon [facility])   Initial Diagnosis   Malignant neoplasm of ascending colon ([place])   05/19/2018 Procedure   Colonoscopy: - Likely malignant tumor in the ascending colon. Biopsied. Tattooed. This is the source of the anemia. - A few 2 to 5 mm polyps in the rectum, in the sigmoid colon and in the ascending colon. - Non-bleeding internal hemorrhoids. - The examined portion of the ileum was normal.   05/19/2018 Pathology Results   1. Duodenum, Biopsy - BENIGN SMALL BOWEL MUCOSA. - NO VILLOUS BLUNTING OR INCREASE IN INTRAEPITHELIAL LYMPHOCYTES. - NO DYSPLASIA OR MALIGNANCY. 2. Stomach, polyp(s) - FUNDIC GLAND POLYP. - WARTHIN-STARRY IS NEGATIVE FOR HELICOBACTER PYLORI. - NO INTESTINAL METAPLASIA,  DYSPLASIA, OR MALIGNANCY. 3. Colon, biopsy, ascending - INVASIVE ADENOCARCINOMA.   05/20/2018 Imaging   CT CAP: IMPRESSION: 1. No CT findings of the abdomen or pelvis to localize suspected GI bleeding. No intraluminal contrast noted.  2. Focal soft tissue thickening of the colon wall superior to the ileocecal valve may reflect reported ascending colon mass.  3. There is extensive fatty mural stratification of the cecum, suggesting chronic inflammatory sequelae of colitis, including Crohn's disease.  4. Moderate right, small left pleural effusions and associated atelectasis or consolidation. There is mild, diffuse interlobular septal thickening, most consistent with edema.  5.  Interval placement of aortic valve stent endograft.  6. There is a lobulated 2.6 cm mass in the central right breast (series 3, image 39), as seen on prior examination. Correlate with mammography.  7.  Other chronic and incidental findings as detailed above.   05/24/2018 Pathology Results   Accession: [id number]  Procedure: Right colon and ileum resection. Tumor Site: Proximal right colon. Tumor Size: 2.5 cm. Macroscopic Tumor Perforation: Not identified. Histologic Type: Invasive adenocarcinoma. Histologic Grade: G2, moderately differentiated. Tumor Extension: Into submucosa. Margins: Negative. Treatment Effect: N/A. Lymphovascular Invasion: N/A. Perineural Invasion: N/A. Tumor Deposits: Not identified. Regional Lymph Nodes: Number of Lymph Nodes Involved: 0 Number of Lymph Nodes Examined: 18 Pathologic Stage Classification (pTNM, AJCC 8th Edition): pT1, pN0 Ancillary Studies: MSI will be ordered. MMR was performed on the biopsy and showed loss of MLH1 and PMS2.  MSI-high, BRAF V600E mutation present    05/24/2018 Cancer Staging  Staging form: Colon and Rectum, AJCC 8th Edition - Pathologic stage from 05/24/2018: Stage I (pT1, pN0, cM0) - Signed by Tish Men, MD on 06/07/2018       Past Medical History:  Diagnosis Date  . Arthritis   . Carotid artery stenosis   . Chronic diastolic CHF (congestive heart failure) ([place])   . Family history of skin cancer   . Former smoker    1/2 ppd for 45 years  . 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 Hyperlipidemia   . Hypertension   . Hypothyroidism   . Peripheral vascular disease ([place])   . RBBB   . 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 Severe aortic stenosis     Past Surgical History:  Procedure Laterality Date  . BASAL CELL CARCINOMA EXCISION  2018  . BIOPSY  05/19/2018   Procedure: BIOPSY;  Surgeon: Lavena Bullion, DO;  Location: [facility];  Service: Gastroenterology;;  . CARDIAC CATHETERIZATION    . COLONOSCOPY  2001  . COLONOSCOPY WITH PROPOFOL N/A 05/19/2018   Procedure: COLONOSCOPY WITH PROPOFOL;  Surgeon: Lavena Bullion, DO;  Location: [facility];  Service: Gastroenterology;  Laterality: N/A;  . ESOPHAGOGASTRODUODENOSCOPY (EGD) WITH PROPOFOL N/A 05/19/2018   Procedure: ESOPHAGOGASTRODUODENOSCOPY (EGD) WITH PROPOFOL;  Surgeon: Lavena Bullion, DO;  Location: [facility];  Service: Gastroenterology;  Laterality: N/A;  . HIP SURGERY  2005  . KNEE SURGERY  1998  . LAPAROSCOPIC PARTIAL COLECTOMY N/A 05/24/2018   Procedure: LAPAROSCOPIC ASSISTED ASCENDING HEMICOLECTOMY;  Surgeon: Ileana Roup, MD;  Location: [facility];  Service: General;  Laterality: N/A;  . PACEMAKER IMPLANT N/A 03/02/2018   Procedure: PACEMAKER IMPLANT;  Surgeon: Evans Lance, MD;  Location: [facility];  Service: Cardiovascular;  Laterality: N/A;  . RIGHT/LEFT HEART CATH AND CORONARY ANGIOGRAPHY N/A 02/21/2018   Procedure: RIGHT/LEFT HEART CATH AND CORONARY ANGIOGRAPHY;  Surgeon: Sherren Mocha, MD;  Location: [facility];  Service: Cardiovascular;   Laterality: N/A;  . SUBMUCOSAL TATTOO INJECTION  05/19/2018   Procedure: SUBMUCOSAL TATTOO INJECTION;  Surgeon: Lavena Bullion, DO;  Location: [facility];  Service: Gastroenterology;;  . TEE WITHOUT CARDIOVERSION N/A 02/28/2018   Procedure: TRANSESOPHAGEAL ECHOCARDIOGRAM (TEE);  Surgeon: Sherren Mocha, MD;  Location: [facility];  Service: Open Heart Surgery;  Laterality: N/A;  . TEMPORARY PACEMAKER N/A 03/01/2018   Procedure: TEMPORARY PACEMAKER;  Surgeon: Burnell Blanks, MD;  Location: [facility];  Service: Cardiovascular;  Laterality: N/A;  . TONSILLECTOMY    . TRANSCATHETER AORTIC VALVE REPLACEMENT, TRANSFEMORAL N/A 02/28/2018   Procedure: TRANSCATHETER AORTIC VALVE REPLACEMENT, TRANSFEMORAL;  Surgeon: Sherren Mocha, MD;  Location: [facility];  Service: Open Heart Surgery;  Laterality: N/A;    Social History   Socioeconomic History  . Marital status: Married    Spouse name: Not on file  . Number of children: Not on file  . Years of education: Not on file  . Highest education level: Not on file  Occupational History  . Not on file  Social Needs  . Financial resource strain: Not on file  . Food insecurity    Worry: Not on file    Inability: Not on file  . Transportation needs    Medical: Not on file    Non-medical: Not on file  Tobacco Use  .  Smoking status: Former Smoker    Packs/day: 1.00    Years: 45.00    Pack years: 45.00    Types: Cigarettes    Quit date: 05/23/2009    Years since quitting: 9.2  . Smokeless tobacco: Never Used  Substance and Sexual Activity  . Alcohol use: Not Currently  . Drug use: Never  . Sexual activity: Not on file  Lifestyle  . Physical activity    Days per week: Not on file    Minutes per session: Not on file  . Stress: Not on file  Relationships  . Social Herbalist on phone: Not on file    Gets together: Not on file    Attends religious service: Not on file    Active member of club or  organization: Not on file    Attends meetings of clubs or organizations: Not on file    Relationship status: Not on file  Other Topics Concern  . Not on file  Social History Narrative  . Not on file     FAMILY HISTORY:  We obtained a detailed, 4-generation family history.  Significant diagnoses are listed below: Family History  Problem Relation Age of Onset  . Heart failure Mother   . Chronic Renal Failure Mother   . Alcohol abuse Father   . Skin cancer Sister 2  . Skin cancer Sister 100     Ms. Gertsch has two sisters. One sister was diagnosed with skin cancer at age 51, and another sister was diagnosed with skin cancer at age 43. Her mother also had "something removed from her face" at an unknown age. Of note, they had frequent exposure to the sun.  Ms. Hernan does not know of any other diagnoses of cancer in her family.  Ms. Shiffer is unaware of previous family history of genetic testing for hereditary cancer risks. There is no reported Ashkenazi Jewish ancestry. There is no known consanguinity.  GENETIC COUNSELING ASSESSMENT: Ms. Bergh is a 85 y.o. female with a personal history of colon cancer and a family history of skin cancer, which is somewhat suggestive of a hereditary cancer syndrome and predisposition to cancer. We, therefore, discussed and recommended the following at today's visit.   DISCUSSION: We discussed that 5 - 10% of cancer is hereditary.  There are many genes that can be associated with hereditary cancer syndromes.  We discussed that testing is beneficial for several reasons including knowing about other cancer risks, identifying potential screening and risk-reduction options that may be appropriate, and to understand if other family members could be at risk for cancer and allow them to undergo genetic testing.   We reviewed the characteristics, features and inheritance patterns of hereditary cancer syndromes, specifically Lynch syndrome. While the general population  risk for colon cancer is 4-5%, individuals with Lynch syndrome have up to a 61% risk to develop colon cancer, in addition to other cancers. We discussed that Ms. Ogden was referred to genetics because of abnormalities in her tumor. Her tumor showed loss of MLH1 and PMS2 proteins as well as microsatellite instability, which are features seen in Lynch syndrome. Further testing also showed the presence of a somatic BRAF V600E mutation in the tumor, which indicates that Ms. Sanko's colon cancer was sporadic and not caused by Lynch syndrome. This is supported by her older age of diagnosis and family history that is inconsistent with Lynch syndrome.  We therefore discussed with Ms. Toya that her personal and family history is not  highly consistent with a familial hereditary cancer syndrome.  We feel she is at low risk to harbor a gene mutation associated with such a condition. Thus, we did not recommend any genetic testing, at this time, and recommended Ms. Swinson continue to follow the cancer screening guidelines given by her primary healthcare provider.  PLAN: Ms. Gilkey did not wish to pursue genetic testing at today's visit. We understand this decision and remain available to coordinate genetic testing at any time in the future. We, therefore, recommend Ms. Berens continue to follow the cancer screening guidelines given by her primary healthcare provider.  Lastly, we encouraged Ms. Baccam to remain in contact with cancer genetics annually so that we can continuously update the family history and inform her of any changes in cancer genetics and testing that may be of benefit for this family.   Ms. Mula questions were answered to her satisfaction today. Our contact information was provided should additional questions or concerns arise. Thank you for the referral and allowing [place] to share in the care of your patient.   Clint Guy, MS Genetic Counselor [email or web address] Phone: [phone]    The patient was seen for a total of 30 minutes in face-to-face genetic counseling.  This patient was discussed with Drs. Magrinat, Lindi Adie and/or Burr Medico who agrees with the above.    _______________________________________________________________________ For Office Staff:  Number of people involved in session: 1 Was an Intern/ student involved with case: no

## 2018-08-18 ENCOUNTER — Telehealth: Payer: Self-pay | Admitting: Cardiology

## 2018-08-18 DIAGNOSIS — I5032 Chronic diastolic (congestive) heart failure: Secondary | ICD-10-CM | POA: Diagnosis not present

## 2018-08-18 DIAGNOSIS — I350 Nonrheumatic aortic (valve) stenosis: Secondary | ICD-10-CM | POA: Diagnosis not present

## 2018-08-18 DIAGNOSIS — K922 Gastrointestinal hemorrhage, unspecified: Secondary | ICD-10-CM | POA: Diagnosis not present

## 2018-08-18 DIAGNOSIS — I69331 Monoplegia of upper limb following cerebral infarction affecting right dominant side: Secondary | ICD-10-CM | POA: Diagnosis not present

## 2018-08-18 DIAGNOSIS — D62 Acute posthemorrhagic anemia: Secondary | ICD-10-CM | POA: Diagnosis not present

## 2018-08-18 DIAGNOSIS — I110 Hypertensive heart disease with heart failure: Secondary | ICD-10-CM | POA: Diagnosis not present

## 2018-08-18 NOTE — Telephone Encounter (Addendum)
Spoke with Myna Bright with [facility] and she is reporting the pt has a blood shot left eye... the pt denies any symptoms of infection such as drainage, pain, burning, visual disturbances... she did not know her eye was like that until the nurse told her.. no injury noted.. no headache  Pt has not had cough, fever, sore throat as far as she knows.. pt did not mention to her and she did not notice.. BP 140/62.   I asked Myna Bright if the pt has had any COVID testing and she says not that she kows of but the pts son is going for testing today but the pt did not tell her why.   She is concerned the Plavix is causing her the eye redness... pt had TAVR 02/28/18 and on Plavix until 08/2018... pt was off of it in May 2020 for GI bleed but restarted 06/2018.  Called the pt to see if she has been exposed to [place] possibly and she says he is being tested for other reasons and not because of exposure.   Will forward to [facility] to see if this is a worrisome assessment with Plavix vs re risk of stopping it early.. need for in person assessment.

## 2018-08-18 NOTE — Telephone Encounter (Signed)
It is fine for her to discontinue plavix at this time.

## 2018-08-18 NOTE — Telephone Encounter (Signed)
New Message   Myna Bright with [facility] is calling because she is there doing a visit and states that the patients left eye looks like it has blood in it. She is not sure if its due to the Plavix.

## 2018-08-18 NOTE — Telephone Encounter (Signed)
Bleeding is a side effect of Plavix, therapy continuation would need to be decided by MD especially given recent TAVR. Would recommend that pt has follow up with with her optometrist for further assessment.

## 2018-08-18 NOTE — Telephone Encounter (Addendum)
Pt advised and will stop her Plavix and will monitor her eye and any new sx over the weekend.

## 2018-08-18 NOTE — Telephone Encounter (Signed)
Pt and Moe with [facility] advised and will monitor her sx for headache, dizziness, weakness... will talk with her PCP since she does not have an Optometrist about who she recommends and will call if any new changes or any other sx of bleeding.   Will forward to [facility] / Dr. Radford Pax for review.

## 2018-08-18 NOTE — Telephone Encounter (Signed)
I think it is fine for her to come off Plavix.  Doubt it is related to Plavix though.    Julie Mcgee please acknowledge if you agree with coming off plavix - she is 11 days from 6 month mark for TAVR

## 2018-08-21 NOTE — Telephone Encounter (Signed)
Spoke with the patients son and assured him that [place], [place] as well as Dr. Radford Pax were fine with patient stopping Plavix as she is only one week away from her 6 month post TAVR. He verbalized understanding and thanked me for the call.

## 2018-08-21 NOTE — Telephone Encounter (Signed)
Patient's son calling about his mother being off of Plavix, he doesn't feel that was good advice for his mother to be off of her Plavix.

## 2018-09-04 ENCOUNTER — Ambulatory Visit (INDEPENDENT_AMBULATORY_CARE_PROVIDER_SITE_OTHER): Payer: Medicare Other | Admitting: *Deleted

## 2018-09-04 DIAGNOSIS — I442 Atrioventricular block, complete: Secondary | ICD-10-CM

## 2018-09-05 LAB — CUP PACEART REMOTE DEVICE CHECK
Battery Remaining Longevity: 151 mo
Battery Voltage: 3.16 V
Brady Statistic AP VP Percent: 0.08 %
Brady Statistic AP VS Percent: 30.13 %
Brady Statistic AS VP Percent: 0.33 %
Brady Statistic AS VS Percent: 69.46 %
Brady Statistic RA Percent Paced: 30.25 %
Brady Statistic RV Percent Paced: 0.42 %
Date Time Interrogation Session: 20200824122929
Implantable Lead Implant Date: 20200220
Implantable Lead Implant Date: 20200220
Implantable Lead Location: 753859
Implantable Lead Location: 753860
Implantable Lead Model: 3830
Implantable Lead Model: 5076
Implantable Pulse Generator Implant Date: 20200220
Lead Channel Impedance Value: 285 Ohm
Lead Channel Impedance Value: 323 Ohm
Lead Channel Impedance Value: 399 Ohm
Lead Channel Impedance Value: 475 Ohm
Lead Channel Pacing Threshold Amplitude: 0.5 V
Lead Channel Pacing Threshold Amplitude: 1.125 V
Lead Channel Pacing Threshold Pulse Width: 0.4 ms
Lead Channel Pacing Threshold Pulse Width: 0.4 ms
Lead Channel Sensing Intrinsic Amplitude: 21.75 mV
Lead Channel Sensing Intrinsic Amplitude: 21.75 mV
Lead Channel Sensing Intrinsic Amplitude: 3.5 mV
Lead Channel Sensing Intrinsic Amplitude: 3.5 mV
Lead Channel Setting Pacing Amplitude: 1.5 V
Lead Channel Setting Pacing Amplitude: 2.5 V
Lead Channel Setting Pacing Pulse Width: 0.4 ms
Lead Channel Setting Sensing Sensitivity: 2.8 mV

## 2018-09-06 ENCOUNTER — Telehealth: Payer: Self-pay

## 2018-09-06 NOTE — Telephone Encounter (Signed)
Alert received for 4 AT/AF episodes, burden 0.1% with longest episode 21 minutes. Rates controlled. 1st episode of AFib since implant, no OAC noted. Will route to Dr. Lovena Le for review.

## 2018-09-06 NOTE — Telephone Encounter (Signed)
She will need to start anti-coagulation. Lets discuss. GT

## 2018-09-08 ENCOUNTER — Telehealth: Payer: Self-pay

## 2018-09-08 DIAGNOSIS — Z79899 Other long term (current) drug therapy: Secondary | ICD-10-CM

## 2018-09-08 NOTE — Telephone Encounter (Signed)
I spoke to patient's son and arranged lab draw for 8/31 for the patient.

## 2018-09-11 ENCOUNTER — Other Ambulatory Visit: Payer: Self-pay

## 2018-09-11 ENCOUNTER — Encounter: Payer: Self-pay | Admitting: Cardiology

## 2018-09-11 ENCOUNTER — Other Ambulatory Visit: Payer: Medicare Other

## 2018-09-11 DIAGNOSIS — Z79899 Other long term (current) drug therapy: Secondary | ICD-10-CM | POA: Diagnosis not present

## 2018-09-11 LAB — CBC
Hematocrit: 32.2 % — ABNORMAL LOW (ref 34.0–46.6)
Hemoglobin: 10.1 g/dL — ABNORMAL LOW (ref 11.1–15.9)
MCH: 26.4 pg — ABNORMAL LOW (ref 26.6–33.0)
MCHC: 31.4 g/dL — ABNORMAL LOW (ref 31.5–35.7)
MCV: 84 fL (ref 79–97)
Platelets: 342 10*3/uL (ref 150–450)
RBC: 3.83 x10E6/uL (ref 3.77–5.28)
RDW: 16.7 % — ABNORMAL HIGH (ref 11.7–15.4)
WBC: 8.6 10*3/uL (ref 3.4–10.8)

## 2018-09-11 NOTE — Progress Notes (Signed)
Remote pacemaker transmission.   

## 2018-09-13 ENCOUNTER — Telehealth: Payer: Self-pay

## 2018-09-13 NOTE — Telephone Encounter (Signed)
-----   Message from Sueanne Margarita, MD sent at 09/12/2018 10:31 PM EDT ----- Hbg improved.  Please find out from PCP if ok to start anticoagulation with Eliquis for afib

## 2018-09-13 NOTE — Telephone Encounter (Signed)
Notes recorded by Frederik Schmidt, RN on 09/13/2018 at 9:01 AM EDT  I spoke to the patient's PCP office and they will call with Eliquis verification.  ------

## 2018-09-14 NOTE — Telephone Encounter (Signed)
I spoke to [place], nurse from Dr Masneri's office in regards to starting patient on Eliquis for Afib with Hgb of 10.1 per Dr Radford Pax.  She will reach out to Dr Curly Rim for advisement and return the call.

## 2018-09-14 NOTE — Telephone Encounter (Signed)
° °  [place] from Dr Curly Rim office calling to discuss Eliquis

## 2018-09-14 NOTE — Telephone Encounter (Signed)
Left Dr Masneri's office message to call back. 9/3

## 2018-09-15 NOTE — Telephone Encounter (Signed)
I called Dr Masneri's office to f/u on Eliquis start for the patient.  Dr Curly Rim has not responded.

## 2018-09-19 NOTE — Telephone Encounter (Signed)
Please contact Dr. Maylon Peppers to make sure he is ok with her starting Eliquis for PAF

## 2018-09-19 NOTE — Telephone Encounter (Signed)
Lm at Dr. Maylon Peppers office... I called too late and they were gone.. will try again first thing in the a.m.   [phone]

## 2018-09-19 NOTE — Telephone Encounter (Signed)
Patient had Stage I colon cancer s/p resection and is in remission. Her anemia should continue to improve with time and iron supplement, so long as she does not have any recurrent GI bleeding event.   She also has a suspicious R breast mass, but the patient has chosen to defer any work-up.  As long as the patient does not have any signs or symptoms of bleeding, there is no oncologic contraindication for Eliquis, but I will defer the decision regarding the benefits vs risks of anticoagulation for A-fib to cardiology.   Thanks.  Dr. Maylon Peppers

## 2018-09-19 NOTE — Telephone Encounter (Signed)
Dr. Curly Rim called today and states Dr. Lennox Pippins stopped pt's coumadin back in November, likely due to no documentation of reason for anticoag from [place] records.  She said they usually say ok to start anticoags 4-7 days remote GI bleed.  She mentioned pt also sees Dr. Maylon Peppers, Heme/Onc with [facility] in the event you want his input as well.  Advised I will send to Dr. Radford Pax for review.

## 2018-09-20 ENCOUNTER — Other Ambulatory Visit: Payer: Self-pay

## 2018-09-20 MED ORDER — APIXABAN 5 MG PO TABS: 5.0000 mg | ORAL_TABLET | Freq: Two times a day (BID) | ORAL | 3 refills | Status: DC

## 2018-09-20 MED ORDER — APIXABAN 5 MG PO TABS: 5.0000 mg | ORAL_TABLET | Freq: Two times a day (BID) | ORAL | 0 refills | Status: DC

## 2018-09-20 NOTE — Telephone Encounter (Signed)
Julie Mcgee, patient had TAVR and was on ASA, now placing on Eliquis for PAF - Ok to D/C ASA?

## 2018-09-20 NOTE — Addendum Note (Signed)
Addended by: Stephani Police on: 09/20/2018 10:27 AM   Modules accepted: Orders

## 2018-09-20 NOTE — Telephone Encounter (Addendum)
Pt advised and we discussed precautions and importance of no missed doses with her Eliquis.[name] pt will call if she develops any further questions. Will send to [facility] but if she does not receive it soon she will call us to send to a local pharmacy.   Should pt continue ASA 81mg  a day?

## 2018-09-20 NOTE — Telephone Encounter (Signed)
Please start Eliquis 5mg  BID and have patient contact us if she notices any dark stools, blood in stool or tissue or vomits blood

## 2018-09-20 NOTE — Telephone Encounter (Signed)
Pts son advised instructions for Eliquis and to D/C ASA... will call in month supply of Eliquis to [facility] so she can start now and will get [employer] [facility] in about a week.

## 2018-09-20 NOTE — Telephone Encounter (Signed)
Yes, okay to stop aspirin. Thanks!

## 2018-09-20 NOTE — Telephone Encounter (Signed)
MD will need to weigh in on continuation/discontinuation of aspirin.

## 2018-09-21 DIAGNOSIS — Z23 Encounter for immunization: Secondary | ICD-10-CM | POA: Diagnosis not present

## 2018-09-26 ENCOUNTER — Other Ambulatory Visit (HOSPITAL_COMMUNITY): Payer: Medicare Other

## 2018-09-27 ENCOUNTER — Telehealth: Payer: Self-pay | Admitting: Physician Assistant

## 2018-09-27 NOTE — Telephone Encounter (Addendum)
**Note De-Identified Gustav Knueppel Obfuscation** I left a message on caller [name] Pizio's (DPR and is the pts son) VM asking him to call me back.

## 2018-09-27 NOTE — Telephone Encounter (Signed)
New Message   Pt c/o medication issue:  1. Name of Medication: Eliquis 5mg    2. How are you currently taking this medication (dosage and times per day)? 1 tablet by mouth 2 times a day  3. Are you having a reaction (difficulty breathing--STAT)? NO  4. What is your medication issue? Patient states medication to expensive wants to know if there is any way to get it cheaper.

## 2018-09-27 NOTE — Telephone Encounter (Signed)
**Note De-Identified Yuriel Lopezmartinez Obfuscation** [name], the pts son, states that Eliquis will cost the pt $116/90 day supply and would rather her not have to pay that. He states that the pt took [place] for 7 to 8 years without any issues or complaints.  He is asking if the pt can take Warfarin again and not Eliquis due to cost.  He is aware that I am sending this message to Dr Radford Pax and that someone in the office will contact him with her recommendation.  He states that when he brings the pt to the office tomorrow for her Echo he will ask to see if Dr Radford Pax has responded to this message.

## 2018-09-27 NOTE — Telephone Encounter (Signed)
Given her hx of CA and her advanced age Eliquis is now the safest anticoagulant in regards to risk of bleeding and I recommend we not change to COumadin.  Please see if [place] can get patient assistance

## 2018-09-28 ENCOUNTER — Ambulatory Visit (HOSPITAL_COMMUNITY): Payer: Medicare Other | Attending: Cardiovascular Disease

## 2018-09-28 ENCOUNTER — Telehealth: Payer: Self-pay | Admitting: *Deleted

## 2018-09-28 ENCOUNTER — Other Ambulatory Visit: Payer: Self-pay

## 2018-09-28 DIAGNOSIS — Z952 Presence of prosthetic heart valve: Secondary | ICD-10-CM | POA: Diagnosis not present

## 2018-09-28 NOTE — Telephone Encounter (Signed)
Patients son in office with his mother asking questions regarding ELIQUIS, I informed him of Dr Landis Gandy notes regarding eliquis vs coumadin. He was fine with that choice. He did show concern of the price so I provided him with patient assistance form from [facility] for help with eliquis.

## 2018-09-28 NOTE — Telephone Encounter (Signed)
Julie Mcgee, would you be able to follow up with pt about Eliquis patient assistance since Dr Radford Pax prefers that pt remain on Eliquis due to safety/efficacy data?

## 2018-09-29 ENCOUNTER — Telehealth: Payer: Self-pay | Admitting: Physician Assistant

## 2018-09-29 NOTE — Telephone Encounter (Signed)
Patient's son called to discuss results of echo. I explained how her TAVR valve gradients have been persistently elevated and stable. 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 The patient is overall doing well (loosing weight and more active than previous). I reassured him that as long as she is doing well, there is not much we would likely do about the severe mitral stenosis aside from controlling her HR with BBs (already on lopressor 50mg  BID w/ ave HR in 60s). I told him I would review with Dr. Radford Pax and make sure she didn't have any other recommendations. Otherwise, I will see them for 1 year TAVR follow up next year with echo per protocol.

## 2018-09-29 NOTE — Telephone Encounter (Signed)
New message   Patient's son would like to discuss echo results. Please call.

## 2018-09-29 NOTE — Telephone Encounter (Signed)
**Note De-identified Julie Mcgee Obfuscation** LMTCB

## 2018-10-03 NOTE — Telephone Encounter (Signed)
6 months followup in March virtual

## 2018-10-03 NOTE — Telephone Encounter (Addendum)
**Note De-Identified Julie Mcgee Obfuscation** The pts son, [name] identified himself on his VM message so I left him a detailed message advising him that due to the pts age and heart conditions Dr Radford Pax recommends that the pt continues taking Eliquis. I left our office phone number so he can call back to discuss.

## 2018-10-03 NOTE — Telephone Encounter (Signed)
Agree and I think that the MV is only moderately stenotic since mean MVG is 15mmHg and MVA is 1.79cm2.  She needs to call if she develops syncope, dizziness, SOB or worsening LE edema. Otherwise followup with me in 6 months virtual

## 2018-10-03 NOTE — Telephone Encounter (Signed)
Spoke with [name], pts son regarding Turn[facility] recommendations. He is agreeable.

## 2018-10-24 NOTE — Telephone Encounter (Signed)
Left message to call back  

## 2018-10-27 ENCOUNTER — Ambulatory Visit (INDEPENDENT_AMBULATORY_CARE_PROVIDER_SITE_OTHER): Payer: Medicare Other | Admitting: Podiatry

## 2018-10-27 ENCOUNTER — Encounter: Payer: Self-pay | Admitting: Podiatry

## 2018-10-27 ENCOUNTER — Other Ambulatory Visit: Payer: Self-pay

## 2018-10-27 DIAGNOSIS — M79676 Pain in unspecified toe(s): Secondary | ICD-10-CM

## 2018-10-27 DIAGNOSIS — B351 Tinea unguium: Secondary | ICD-10-CM | POA: Diagnosis not present

## 2018-10-27 NOTE — Progress Notes (Signed)
Subjective:  Julie Mcgee presents to clinic today with cc of  painful, thick, discolored, elongated toenails 1-5 b/l that become tender and cannot cut because of thickness. Pain is aggravated when wearing enclosed shoe gear.  Masneri, Adele Barthel, DO is her PCP.   Current Outpatient Medications on File Prior to Visit  Medication Sig Dispense Refill  . amoxicillin (AMOXIL) 500 MG tablet Please take 2,000 mg (4 tablets) one hour prior to all dental procedures. 8 tablet 8  . apixaban (ELIQUIS) 5 MG TABS tablet Take 1 tablet (5 mg total) by mouth 2 (two) times daily. 60 tablet 0  . Calcium Carb-Cholecalciferol (CALTRATE 600+D3 PO) Take 1 tablet by mouth 2 (two) times daily.    . clopidogrel (PLAVIX) 75 MG tablet     . diphenhydrAMINE (BENADRYL) 25 MG tablet Take 25 mg by mouth every 6 (six) hours as needed.    . ferrous sulfate 325 (65 FE) MG tablet Take 1 tablet (325 mg total) by mouth every other day. 15 tablet 0  . furosemide (LASIX) 20 MG tablet Take 1 tablet (20 mg total) by mouth daily as needed. 90 tablet 3  . levothyroxine (SYNTHROID) 100 MCG tablet Take 100 mcg by mouth daily before breakfast.     . Melatonin 5 MG CAPS Take 5 mg by mouth at bedtime as needed (sleep).     . metoprolol tartrate (LOPRESSOR) 50 MG tablet Take 50 mg by mouth 2 (two) times daily.    . Multiple Vitamins-Minerals (MULTIVITAMIN ADULTS 50+) TABS Take 1 tablet by mouth daily.    . pantoprazole (PROTONIX) 40 MG tablet Take 1 tablet (40 mg total) by mouth daily. 30 tablet 0  . saccharomyces boulardii (FLORASTOR) 250 MG capsule Take 250 mg by mouth 2 (two) times daily.    . simvastatin (ZOCOR) 40 MG tablet Take 1 tablet (40 mg total) by mouth at bedtime. 90 tablet 3   No current facility-administered medications on file prior to visit.      Allergies  Allergen Reactions  . Adhesive [Tape] Other (See Comments)    Causes BLISTERS     Objective:  General: 85 yo Caucasian female, WD, WN in NAD. AAO x 3. Hearing  impaired. There were no vitals filed for this visit.  Physical Examination:  Vascular Examination: Capillary refill time immediate x 10 digits.  Palpable DP/PT pulses b/l.  Digital hair sparse b/l.  No edema noted b/l.  Skin temperature gradient WNL b/l.  Dermatological Examination: Skin with normal turgor, texture and tone b/l.  No open wounds b/l.  No interdigital macerations noted b/l.  Elongated, thick, discolored brittle toenails with subungual debris and pain on dorsal palpation of nailbeds 1-5 b/l.  Musculoskeletal Examination: Muscle strength 5/5 to all muscle groups b/l.  No pain, crepitus or joint discomfort with active/passive ROM.  Neurological Examination: Sensation intact 5/5 b/l with 10 gram monofilament.  Vibratory sensation intact b/l.  Proprioceptive sensation intact b/l.  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 All questions/concerns addressed during today's visit. 2. Toenails 1-5 b/l were debrided in length and girth without iatrogenic laceration. 2.  Continue soft, supportive shoe gear daily. 3.  Report any pedal injuries to medical professional. 4.  Follow up 3 months. 5.  Patient/POA to call should there be a question/concern in there interim.

## 2018-10-27 NOTE — Patient Instructions (Signed)

## 2018-10-30 ENCOUNTER — Telehealth: Payer: Self-pay | Admitting: Cardiology

## 2018-10-30 ENCOUNTER — Encounter: Payer: Self-pay | Admitting: *Deleted

## 2018-10-30 NOTE — Telephone Encounter (Signed)
Called patient's son about patient's sx of severe dizziness and vision loss.  Sx have resolved.  Instructed son to take patient to ER for evaluation and Head CT.  She is on chronic Eliquis so need to rule out bleed as well as CVA.  Son agrees to take her to ER tonight.

## 2018-10-30 NOTE — Telephone Encounter (Signed)
Spoke with patient's son, [name]. Attempted to assist with manual [facility] transmission via app--app is disconnected and he is unsure of password. Advised to call [facility] tech support for further assistance this evening. ED precautions reiterated if symptoms return or worsen. Will be on the lookout for a transmission tomorrow. William in agreement with this plan. No further questions at this time.

## 2018-10-30 NOTE — Telephone Encounter (Signed)
Hi Tonya,  Can you schedule a video visit for the patient for next Friday (11/10/2018) at noon?   Thanks.  [place]

## 2018-10-30 NOTE — Telephone Encounter (Signed)
Spoke with [name], patient's son. He stated patient had dizziness and loss of vision a few hours ago. Patient does not have any symptoms at this time and feels fine. Informed patient's son that loss of vision could be a sign of a stroke. Patient's son stated patient did not pass out and she was sitting down when she lost vision. Asked him if patient had any other symptoms of a stroke. Son is not with his mother at this time. Informed patient's son if patient has signs and symptoms of a stroke she needs to go to the ER. Son does not want to take patient to ER at this time. Informed Son that if patient's symptoms come back to go to the ED/call 911. Patient's son stated patient is taking her elquis. Will send message to Dr. Radford Pax for further advisement. Patient given appointment with Dr. Radford Pax this Wednesday, next available.

## 2018-10-30 NOTE — Telephone Encounter (Signed)
New message   STAT if patient feels like he/she is going to faint   1) Are you dizzy now?no   2) Do you feel faint or have you passed out? No   3) Do you have any other symptoms?lost vision for a little bit with the dizziness   4) Have you checked your HR and BP (record if available)? No

## 2018-10-31 ENCOUNTER — Emergency Department (HOSPITAL_COMMUNITY): Payer: Medicare Other

## 2018-10-31 ENCOUNTER — Other Ambulatory Visit: Payer: Self-pay

## 2018-10-31 ENCOUNTER — Encounter (HOSPITAL_COMMUNITY): Payer: Self-pay

## 2018-10-31 ENCOUNTER — Emergency Department (HOSPITAL_COMMUNITY)
Admission: EM | Admit: 2018-10-31 | Discharge: 2018-10-31 | Disposition: A | Discharge status: Home / Self Care | Payer: Medicare Other | Source: Home / Self Care | Attending: Emergency Medicine | Admitting: Emergency Medicine

## 2018-10-31 DIAGNOSIS — H543 Unqualified visual loss, both eyes: Secondary | ICD-10-CM

## 2018-10-31 DIAGNOSIS — I130 Hypertensive heart and chronic kidney disease with heart failure and stage 1 through stage 4 chronic kidney disease, or unspecified chronic kidney disease: Secondary | ICD-10-CM | POA: Diagnosis not present

## 2018-10-31 DIAGNOSIS — Z20828 Contact with and (suspected) exposure to other viral communicable diseases: Secondary | ICD-10-CM | POA: Diagnosis not present

## 2018-10-31 DIAGNOSIS — I110 Hypertensive heart disease with heart failure: Secondary | ICD-10-CM | POA: Insufficient documentation

## 2018-10-31 DIAGNOSIS — E039 Hypothyroidism, unspecified: Secondary | ICD-10-CM | POA: Insufficient documentation

## 2018-10-31 DIAGNOSIS — H547 Unspecified visual loss: Secondary | ICD-10-CM | POA: Diagnosis not present

## 2018-10-31 DIAGNOSIS — S0181XA Laceration without foreign body of other part of head, initial encounter: Secondary | ICD-10-CM | POA: Diagnosis not present

## 2018-10-31 DIAGNOSIS — I6503 Occlusion and stenosis of bilateral vertebral arteries: Secondary | ICD-10-CM | POA: Diagnosis not present

## 2018-10-31 DIAGNOSIS — Z85038 Personal history of other malignant neoplasm of large intestine: Secondary | ICD-10-CM | POA: Insufficient documentation

## 2018-10-31 DIAGNOSIS — R42 Dizziness and giddiness: Secondary | ICD-10-CM

## 2018-10-31 DIAGNOSIS — I5032 Chronic diastolic (congestive) heart failure: Secondary | ICD-10-CM | POA: Diagnosis not present

## 2018-10-31 DIAGNOSIS — Z87891 Personal history of nicotine dependence: Secondary | ICD-10-CM | POA: Insufficient documentation

## 2018-10-31 DIAGNOSIS — I69351 Hemiplegia and hemiparesis following cerebral infarction affecting right dominant side: Secondary | ICD-10-CM | POA: Diagnosis not present

## 2018-10-31 DIAGNOSIS — I442 Atrioventricular block, complete: Secondary | ICD-10-CM | POA: Diagnosis not present

## 2018-10-31 DIAGNOSIS — Z7901 Long term (current) use of anticoagulants: Secondary | ICD-10-CM | POA: Insufficient documentation

## 2018-10-31 DIAGNOSIS — Z03818 Encounter for observation for suspected exposure to other biological agents ruled out: Secondary | ICD-10-CM | POA: Diagnosis not present

## 2018-10-31 DIAGNOSIS — R05 Cough: Secondary | ICD-10-CM | POA: Diagnosis not present

## 2018-10-31 DIAGNOSIS — R55 Syncope and collapse: Secondary | ICD-10-CM | POA: Diagnosis not present

## 2018-10-31 DIAGNOSIS — I10 Essential (primary) hypertension: Secondary | ICD-10-CM | POA: Diagnosis not present

## 2018-10-31 DIAGNOSIS — I951 Orthostatic hypotension: Secondary | ICD-10-CM | POA: Diagnosis not present

## 2018-10-31 DIAGNOSIS — I6523 Occlusion and stenosis of bilateral carotid arteries: Secondary | ICD-10-CM | POA: Diagnosis not present

## 2018-10-31 DIAGNOSIS — I739 Peripheral vascular disease, unspecified: Secondary | ICD-10-CM | POA: Insufficient documentation

## 2018-10-31 DIAGNOSIS — S0990XA Unspecified injury of head, initial encounter: Secondary | ICD-10-CM | POA: Diagnosis not present

## 2018-10-31 DIAGNOSIS — Z79899 Other long term (current) drug therapy: Secondary | ICD-10-CM | POA: Insufficient documentation

## 2018-10-31 DIAGNOSIS — R270 Ataxia, unspecified: Secondary | ICD-10-CM | POA: Diagnosis not present

## 2018-10-31 DIAGNOSIS — R479 Unspecified speech disturbances: Secondary | ICD-10-CM | POA: Insufficient documentation

## 2018-10-31 LAB — URINALYSIS, ROUTINE W REFLEX MICROSCOPIC
Bilirubin Urine: NEGATIVE
Glucose, UA: NEGATIVE mg/dL
Hgb urine dipstick: NEGATIVE
Ketones, ur: NEGATIVE mg/dL
Nitrite: NEGATIVE
Protein, ur: NEGATIVE mg/dL
Specific Gravity, Urine: 1.016 (ref 1.005–1.030)
pH: 6 (ref 5.0–8.0)

## 2018-10-31 LAB — COMPREHENSIVE METABOLIC PANEL
ALT: 12 U/L (ref 0–44)
AST: 21 U/L (ref 15–41)
Albumin: 3.8 g/dL (ref 3.5–5.0)
Alkaline Phosphatase: 63 U/L (ref 38–126)
Anion gap: 10 (ref 5–15)
BUN: 18 mg/dL (ref 8–23)
CO2: 24 mmol/L (ref 22–32)
Calcium: 9.7 mg/dL (ref 8.9–10.3)
Chloride: 105 mmol/L (ref 98–111)
Creatinine, Ser: 1.09 mg/dL — ABNORMAL HIGH (ref 0.44–1.00)
GFR calc Af Amer: 54 mL/min — ABNORMAL LOW (ref >60)
GFR calc non Af Amer: 47 mL/min — ABNORMAL LOW (ref >60)
Glucose, Bld: 75 mg/dL (ref 70–99)
Potassium: 4 mmol/L (ref 3.5–5.1)
Sodium: 139 mmol/L (ref 135–145)
Total Bilirubin: 0.7 mg/dL (ref 0.3–1.2)
Total Protein: 7.3 g/dL (ref 6.5–8.1)

## 2018-10-31 LAB — DIFFERENTIAL
Abs Immature Granulocytes: 0.03 10*3/uL (ref 0.00–0.07)
Basophils Absolute: 0 10*3/uL (ref 0.0–0.1)
Basophils Relative: 0 %
Eosinophils Absolute: 0.3 10*3/uL (ref 0.0–0.5)
Eosinophils Relative: 3 %
Immature Granulocytes: 0 %
Lymphocytes Relative: 14 %
Lymphs Abs: 1.3 10*3/uL (ref 0.7–4.0)
Monocytes Absolute: 0.7 10*3/uL (ref 0.1–1.0)
Monocytes Relative: 8 %
Neutro Abs: 6.8 10*3/uL (ref 1.7–7.7)
Neutrophils Relative %: 75 %

## 2018-10-31 LAB — CBC
HCT: 40.3 % (ref 36.0–46.0)
Hemoglobin: 12.6 g/dL (ref 12.0–15.0)
MCH: 28.4 pg (ref 26.0–34.0)
MCHC: 31.3 g/dL (ref 30.0–36.0)
MCV: 90.8 fL (ref 80.0–100.0)
Platelets: 267 10*3/uL (ref 150–400)
RBC: 4.44 MIL/uL (ref 3.87–5.11)
RDW: 17.9 % — ABNORMAL HIGH (ref 11.5–15.5)
WBC: 9.2 10*3/uL (ref 4.0–10.5)
nRBC: 0 % (ref 0.0–0.2)

## 2018-10-31 LAB — PROTIME-INR
INR: 1.4 — ABNORMAL HIGH (ref 0.8–1.2)
Prothrombin Time: 17.3 seconds — ABNORMAL HIGH (ref 11.4–15.2)

## 2018-10-31 LAB — APTT: aPTT: 38 seconds — ABNORMAL HIGH (ref 24–36)

## 2018-10-31 LAB — CBG MONITORING, ED: Glucose-Capillary: 83 mg/dL (ref 70–99)

## 2018-10-31 MED ORDER — SODIUM CHLORIDE 0.9% FLUSH: 3.0000 mL | Freq: Once | INTRAVENOUS | Status: DC

## 2018-10-31 MED ORDER — MECLIZINE HCL 25 MG PO TABS
25.0000 mg | ORAL_TABLET | Freq: Once | ORAL | Status: AC
  Administered 2018-10-31: 25 mg via ORAL
  Filled 2018-10-31: qty 1

## 2018-10-31 MED ORDER — SODIUM CHLORIDE 0.9 % IV BOLUS
1000.0000 mL | Freq: Once | INTRAVENOUS | Status: AC
  Administered 2018-10-31: 1000 mL via INTRAVENOUS

## 2018-10-31 MED ORDER — IOHEXOL 350 MG/ML SOLN
75.0000 mL | Freq: Once | INTRAVENOUS | Status: AC | PRN
  Administered 2018-10-31: 75 mL via INTRAVENOUS

## 2018-10-31 NOTE — ED Notes (Signed)
Patient transported to CT 

## 2018-10-31 NOTE — ED Provider Notes (Signed)
Assumed care of patient at 3 PM.  History of heart failure, high cholesterol, hypertension, stroke with speech and right upper extremity weakness at baseline.  Patient states for the last several weeks she has had some dizziness.  States that yesterday she had some complete vision loss bilaterally for maybe 30 minutes.  However, she states that she feels back to baseline.  She overall appears neurologically intact.  Does have some slurred speech but she states that that is baseline.  She has some abnormal finger-to-nose finger to the right but she states that is also likely chronic.  She has some trace weakness in the right upper extremity.  However, it appears that patient is at her neurologic baseline.  Lab work and CT scan were unremarkable.  Plan is to get MRI/MRA of head and brain to rule out stroke.  However patient recently had pacemaker placed.  She is on blood thinners already.  Will see if pacemaker is MRI compatible.  Unable to obtain MRI due to pacemaker.  Will get a CTA of head and neck.  If unremarkable can follow-up with neurology outpatient.  Patient maximally on therapy already.  Discussed with neurology.  Family is okay with this plan.  Neurologically at baseline.  CTA shows bilateral stenosis of the ICA.  Possibly 80% left ICA.  Talk with neurology for outpatient plan.  We will have her follow-up outpatient with neurology as well as vascular surgery.  Overall she is asymptomatic at this time.  Discharged in good condition.  Understands follow-up precautions.  This chart was dictated using voice recognition software.  Despite best efforts to proofread,  errors can occur which can change the documentation meaning.     Lennice Sites, DO 10/31/18 [id number]

## 2018-10-31 NOTE — ED Triage Notes (Signed)
Patient complains of several weeks of dizziness and intermittent blurred vision. On arrival states that the dizziness is minimal today and vision normal

## 2018-10-31 NOTE — ED Provider Notes (Signed)
[facility] EMERGENCY DEPARTMENT Provider Note   CSN: [id number] Arrival date & time: 10/31/18  1031     History   Chief Complaint Chief Complaint  Patient presents with  . Dizziness    HPI Julie Mcgee is a 85 y.o. female.     85 yo F with a cc of dizziness.  Hard for her to describe.  Going on for about three weeks or so.  Usually worse at night.  No one sided weakness, no difficulty swallowing.  Patient with some slurred speech yesterday.  Also described complete loss of vision that lasted for a couple minutes and resolved. Hx of prior stroke.   The history is provided by the patient.  Dizziness Associated symptoms: no chest pain, no headaches, no nausea, no palpitations, no shortness of breath and no vomiting   Illness Severity:  Moderate Onset quality:  Gradual Duration:  2 days Timing:  Constant Progression:  Worsening Chronicity:  New Associated symptoms: no chest pain, no congestion, no fever, no headaches, no myalgias, no nausea, no rhinorrhea, no shortness of breath, no vomiting and no wheezing     Past Medical History:  Diagnosis Date  . Arthritis   . Carotid artery stenosis   . Chronic diastolic CHF (congestive heart failure) ([place])   . Family history of skin cancer   . Former smoker    1/2 ppd for 45 years  . 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 Hyperlipidemia   . Hypertension   . Hypothyroidism   . Peripheral vascular disease ([place])   . RBBB   . 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 Severe aortic stenosis     Patient Active Problem List   Diagnosis Date Noted  . Family history of skin cancer   . Chronic diastolic CHF (congestive heart failure) ([place]) 06/19/2018  . Breast lesion 06/07/2018  . Malignant neoplasm of ascending colon ([place])   . Colonic mass   .  Hiatal hernia   . Gastrointestinal hemorrhage   . Anticoagulated   . Acute upper GI bleed 05/17/2018  . Symptomatic anemia   . Complete heart block ([place]) 03/02/2018  . Sinus bradycardia   . Cardiac arrest ([place])   . 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 RBBB   . Severe aortic stenosis 05/31/2017  . History of stroke 05/31/2017  . Unstable gait 05/31/2017  . Recurrent falls 05/31/2017  . Hypertension 05/13/2017  . Hyperlipidemia 05/13/2017  . Hypothyroidism 05/13/2017  . Arthritis 05/13/2017    Past Surgical History:  Procedure Laterality Date  . BASAL CELL CARCINOMA EXCISION  2018  . BIOPSY  05/19/2018   Procedure: BIOPSY;  Surgeon: Lavena Bullion, DO;  Location: [facility];  Service: Gastroenterology;;  . CARDIAC CATHETERIZATION    . COLONOSCOPY  2001  . COLONOSCOPY WITH PROPOFOL N/A 05/19/2018   Procedure: COLONOSCOPY WITH PROPOFOL;  Surgeon: Lavena Bullion, DO;  Location: [facility];  Service: Gastroenterology;  Laterality: N/A;  . ESOPHAGOGASTRODUODENOSCOPY (EGD) WITH PROPOFOL N/A 05/19/2018   Procedure: ESOPHAGOGASTRODUODENOSCOPY (EGD) WITH PROPOFOL;  Surgeon: Lavena Bullion, DO;  Location: [facility];  Service: Gastroenterology;  Laterality: N/A;  . HIP SURGERY  2005  . KNEE SURGERY  1998  . LAPAROSCOPIC PARTIAL COLECTOMY N/A 05/24/2018   Procedure: LAPAROSCOPIC ASSISTED ASCENDING HEMICOLECTOMY;  Surgeon: Ileana Roup, MD;  Location: [facility];  Service: General;  Laterality: N/A;  . PACEMAKER IMPLANT N/A 03/02/2018   Procedure: PACEMAKER IMPLANT;  Surgeon: Evans Lance, MD;  Location: [facility];  Service: Cardiovascular;  Laterality: N/A;  . RIGHT/LEFT HEART CATH AND CORONARY ANGIOGRAPHY N/A 02/21/2018   Procedure: RIGHT/LEFT HEART CATH AND CORONARY ANGIOGRAPHY;  Surgeon: Sherren Mocha, MD;  Location: [facility];  Service: Cardiovascular;  Laterality: N/A;  . SUBMUCOSAL TATTOO INJECTION  05/19/2018   Procedure:  SUBMUCOSAL TATTOO INJECTION;  Surgeon: Lavena Bullion, DO;  Location: [facility];  Service: Gastroenterology;;  . TEE WITHOUT CARDIOVERSION N/A 02/28/2018   Procedure: TRANSESOPHAGEAL ECHOCARDIOGRAM (TEE);  Surgeon: Sherren Mocha, MD;  Location: [facility];  Service: Open Heart Surgery;  Laterality: N/A;  . TEMPORARY PACEMAKER N/A 03/01/2018   Procedure: TEMPORARY PACEMAKER;  Surgeon: Burnell Blanks, MD;  Location: [facility];  Service: Cardiovascular;  Laterality: N/A;  . TONSILLECTOMY    . TRANSCATHETER AORTIC VALVE REPLACEMENT, TRANSFEMORAL N/A 02/28/2018   Procedure: TRANSCATHETER AORTIC VALVE REPLACEMENT, TRANSFEMORAL;  Surgeon: Sherren Mocha, MD;  Location: [facility];  Service: Open Heart Surgery;  Laterality: N/A;     OB History   No obstetric history on file.      Home Medications    Prior to Admission medications   Medication Sig Start Date End Date Taking? Authorizing Provider  amoxicillin (AMOXIL) 500 MG tablet Please take 2,000 mg (4 tablets) one hour prior to all dental procedures. 03/16/18   Eileen Stanford, PA-C  apixaban (ELIQUIS) 5 MG TABS tablet Take 1 tablet (5 mg total) by mouth 2 (two) times daily. 09/20/18   Sueanne Margarita, MD  Calcium Carb-Cholecalciferol (CALTRATE 600+D3 PO) Take 1 tablet by mouth 2 (two) times daily.    [provider]  clopidogrel (PLAVIX) 75 MG tablet  08/28/18   [provider]  diphenhydrAMINE (BENADRYL) 25 MG tablet Take 25 mg by mouth every 6 (six) hours as needed.    [provider]  ferrous sulfate 325 (65 FE) MG tablet Take 1 tablet (325 mg total) by mouth every other day. 05/27/18   Shirley, [place], DO  furosemide (LASIX) 20 MG tablet Take 1 tablet (20 mg total) by mouth daily as needed. 08/02/18 08/02/19  Sueanne Margarita, MD  levothyroxine (SYNTHROID) 100 MCG tablet Take 100 mcg by mouth daily before breakfast.     [provider]  Melatonin 5 MG CAPS Take 5 mg by  mouth at bedtime as needed (sleep).     [provider]  metoprolol tartrate (LOPRESSOR) 50 MG tablet Take 50 mg by mouth 2 (two) times daily.    [provider]  Multiple Vitamins-Minerals (MULTIVITAMIN ADULTS 50+) TABS Take 1 tablet by mouth daily.    [provider]  pantoprazole (PROTONIX) 40 MG tablet Take 1 tablet (40 mg total) by mouth daily. 05/28/18   Shirley, [place], DO  saccharomyces boulardii (FLORASTOR) 250 MG capsule Take 250 mg by mouth 2 (two) times daily.    [provider]  simvastatin (ZOCOR) 40 MG tablet Take 1 tablet (40 mg total) by mouth at bedtime. 06/20/18 09/18/18  Sueanne Margarita, MD    Family History Family History  Problem Relation Age of Onset  . Heart failure Mother   . Chronic Renal Failure Mother   . Alcohol abuse Father   . Skin cancer Sister 68  . Skin cancer Sister 22    Social History Social  History   Tobacco Use  . Smoking status: Former Smoker    Packs/day: 1.00    Years: 45.00    Pack years: 45.00    Types: Cigarettes    Quit date: 05/23/2009    Years since quitting: 9.4  . Smokeless tobacco: Never Used  Substance Use Topics  . Alcohol use: Not Currently  . Drug use: Never     Allergies   Adhesive [tape]   Review of Systems Review of Systems  Constitutional: Negative for chills and fever.  HENT: Negative for congestion and rhinorrhea.   Eyes: Negative for redness and visual disturbance.  Respiratory: Negative for shortness of breath and wheezing.   Cardiovascular: Negative for chest pain and palpitations.  Gastrointestinal: Negative for nausea and vomiting.  Genitourinary: Negative for dysuria and urgency.  Musculoskeletal: Negative for arthralgias and myalgias.  Skin: Negative for pallor and wound.  Neurological: Positive for dizziness and speech difficulty (slurred). Negative for headaches.     Physical Exam Updated Vital Signs BP (!) 174/68   Pulse (!) 59   Temp 98.2 F (36.8 C) (Oral)    Resp 20   SpO2 100%   Physical Exam Vitals signs and nursing note reviewed.  Constitutional:      General: She is not in acute distress.    Appearance: She is well-developed. She is not diaphoretic.  HENT:     Head: Normocephalic and atraumatic.  Eyes:     Pupils: Pupils are equal, round, and reactive to light.  Neck:     Musculoskeletal: Normal range of motion and neck supple.  Cardiovascular:     Rate and Rhythm: Normal rate and regular rhythm.     Heart sounds: No murmur. No friction rub. No gallop.   Pulmonary:     Effort: Pulmonary effort is normal.     Breath sounds: No wheezing or rales.  Abdominal:     General: There is no distension.     Palpations: Abdomen is soft.     Tenderness: There is no abdominal tenderness.  Musculoskeletal:        General: No tenderness.  Skin:    General: Skin is warm and dry.  Neurological:     Mental Status: She is alert and oriented to person, place, and time.     Cranial Nerves: Cranial nerves are intact.     Motor: Weakness present.     Coordination: Finger-Nose-Finger Test abnormal.     Gait: Gait is intact.     Comments: RUE weakness with wavering finger to nose, chronic and unchanged per patient.   Psychiatric:        Behavior: Behavior normal.      ED Treatments / Results  Labs (all labs ordered are listed, but only abnormal results are displayed) Labs Reviewed  PROTIME-INR - Abnormal; Notable for the following components:      Result Value   Prothrombin Time 17.3 (*)    INR 1.4 (*)    All other components within normal limits  APTT - Abnormal; Notable for the following components:   aPTT 38 (*)    All other components within normal limits  CBC - Abnormal; Notable for the following components:   RDW 17.9 (*)    All other components within normal limits  COMPREHENSIVE METABOLIC PANEL - Abnormal; Notable for the following components:   Creatinine, Ser 1.09 (*)    GFR calc non Af Amer 47 (*)    GFR calc Af Amer 54  (*)  All other components within normal limits  URINALYSIS, ROUTINE W REFLEX MICROSCOPIC - Abnormal; Notable for the following components:   APPearance HAZY (*)    Leukocytes,Ua SMALL (*)    Bacteria, UA RARE (*)    All other components within normal limits  DIFFERENTIAL  CBG MONITORING, ED    EKG EKG Interpretation  Date/Time:  Tuesday October 31 2018 10:39:38 EDT Ventricular Rate:  60 PR Interval:  228 QRS Duration: 154 QT Interval:  474 QTC Calculation: 474 R Axis:   98 Text Interpretation:  Atrial-paced rhythm with prolonged AV conduction Right bundle branch block T wave abnormality, consider inferior ischemia Abnormal ECG No significant change since last tracing Confirmed by Deno Etienne [phone]) on 10/31/2018 11:52:50 AM   Radiology Ct Head Wo Contrast  Result Date: 10/31/2018 CLINICAL DATA:  Dizziness EXAM: CT HEAD WITHOUT CONTRAST TECHNIQUE: Contiguous axial images were obtained from the base of the skull through the vertex without intravenous contrast. COMPARISON:  10/14/2017 FINDINGS: Brain: Large old bilateral cerebellar infarcts, stable. There is atrophy and chronic small vessel disease changes. No acute intracranial abnormality. Specifically, no hemorrhage, hydrocephalus, mass lesion, acute infarction, or significant intracranial injury. Vascular: No hyperdense vessel or unexpected calcification. Skull: No acute calvarial abnormality. Sinuses/Orbits: Visualized paranasal sinuses and mastoids clear. Orbital soft tissues unremarkable. Other: None IMPRESSION: Large old bilateral cerebellar infarcts and encephalomalacia. Atrophy, chronic microvascular disease. No acute intracranial abnormality. Electronically Signed   By: Rolm Baptise M.D.   On: 10/31/2018 11:20   Dg Chest Port 1 View  Result Date: 10/31/2018 CLINICAL DATA:  Pt having weakness, dizziness for 2 weeks, syncope yesterday - hx of CHF, htn, CVA, TAVR, exsmoker, RBBBcough, weakness EXAM: PORTABLE CHEST 1 VIEW  COMPARISON:  03/03/2018 FINDINGS: LEFT-sided pacemaker overlies stable cardiac silhouette. Lungs are mildly hyperinflated. No effusion, infiltrate pneumothorax. No acute osseous abnormality. IMPRESSION: No acute cardiopulmonary process. Electronically Signed   By: Suzy Bouchard M.D.   On: 10/31/2018 12:47    Procedures Procedures (including critical care time)  Medications Ordered in ED Medications  sodium chloride flush (NS) 0.9 % injection 3 mL (3 mLs Intravenous Not Given 10/31/18 1146)  sodium chloride 0.9 % bolus 1,000 mL (1,000 mLs Intravenous New Bag/Given 10/31/18 1253)  meclizine (ANTIVERT) tablet 25 mg (25 mg Oral Given 10/31/18 1320)     Initial Impression / Assessment and Plan / ED Course  I have reviewed the triage vital signs and the nursing notes.  Pertinent labs & imaging results that were available during my care of the patient were reviewed by me and considered in my medical decision making (see chart for details).        85 yo F with a cc of dizziness.  Going on for three weeks or so.  Had an event yesterday concerning for stroke, slurred speech with loss of vision.  No deficits other than chronic RUE weakness and wavering finger to nose.  Patient declining MRI.  Will obtain UA, cxr, give bolus of fluids, meclizine, reassess.   Now will have MRI.    Signed out to Dr. Ronnald Nian, please see their note for further details of care in the ED.   The patients results and plan were reviewed and discussed.   Any x-rays performed were independently reviewed by myself.   Differential diagnosis were considered with the presenting HPI.  Medications  sodium chloride flush (NS) 0.9 % injection 3 mL (3 mLs Intravenous Not Given 10/31/18 1146)  sodium chloride 0.9 % bolus 1,000 mL (1,000 mLs  Intravenous New Bag/Given 10/31/18 1253)  meclizine (ANTIVERT) tablet 25 mg (25 mg Oral Given 10/31/18 1320)    Vitals:   10/31/18 1043 10/31/18 1147 10/31/18 1230  BP: (!) 145/63   (!) 174/68  Pulse: (!) 59 (!) 59 (!) 59  Resp: 14 18 20   Temp: 98.2 F (36.8 C)    TempSrc: Oral    SpO2: 99% 100% 100%    Final diagnoses:  Dizziness  Vision loss, bilateral       Final Clinical Impressions(s) / ED Diagnoses   Final diagnoses:  Dizziness  Vision loss, bilateral    ED Discharge Orders    None       Deno Etienne, DO 10/31/18 1501

## 2018-11-01 ENCOUNTER — Encounter (HOSPITAL_COMMUNITY): Payer: Self-pay | Admitting: Emergency Medicine

## 2018-11-01 ENCOUNTER — Ambulatory Visit: Payer: Medicare Other | Admitting: Cardiology

## 2018-11-01 ENCOUNTER — Inpatient Hospital Stay (HOSPITAL_COMMUNITY)
Admission: EM | Admit: 2018-11-01 | Discharge: 2018-11-03 | DRG: 312 | Disposition: A | Discharge status: Home Health | Payer: Medicare Other | Attending: Internal Medicine | Admitting: Internal Medicine

## 2018-11-01 ENCOUNTER — Other Ambulatory Visit: Payer: Self-pay

## 2018-11-01 ENCOUNTER — Emergency Department (HOSPITAL_COMMUNITY): Payer: Medicare Other

## 2018-11-01 DIAGNOSIS — I130 Hypertensive heart and chronic kidney disease with heart failure and stage 1 through stage 4 chronic kidney disease, or unspecified chronic kidney disease: Secondary | ICD-10-CM | POA: Diagnosis present

## 2018-11-01 DIAGNOSIS — R55 Syncope and collapse: Secondary | ICD-10-CM | POA: Diagnosis not present

## 2018-11-01 DIAGNOSIS — Z85038 Personal history of other malignant neoplasm of large intestine: Secondary | ICD-10-CM

## 2018-11-01 DIAGNOSIS — R42 Dizziness and giddiness: Secondary | ICD-10-CM

## 2018-11-01 DIAGNOSIS — R0902 Hypoxemia: Secondary | ICD-10-CM | POA: Diagnosis not present

## 2018-11-01 DIAGNOSIS — Z7901 Long term (current) use of anticoagulants: Secondary | ICD-10-CM

## 2018-11-01 DIAGNOSIS — Z20828 Contact with and (suspected) exposure to other viral communicable diseases: Secondary | ICD-10-CM | POA: Diagnosis present

## 2018-11-01 DIAGNOSIS — Z85828 Personal history of other malignant neoplasm of skin: Secondary | ICD-10-CM

## 2018-11-01 DIAGNOSIS — Z03818 Encounter for observation for suspected exposure to other biological agents ruled out: Secondary | ICD-10-CM | POA: Diagnosis not present

## 2018-11-01 DIAGNOSIS — Z952 Presence of prosthetic heart valve: Secondary | ICD-10-CM | POA: Diagnosis not present

## 2018-11-01 DIAGNOSIS — I739 Peripheral vascular disease, unspecified: Secondary | ICD-10-CM | POA: Diagnosis present

## 2018-11-01 DIAGNOSIS — N1831 Chronic kidney disease, stage 3a: Secondary | ICD-10-CM | POA: Diagnosis present

## 2018-11-01 DIAGNOSIS — Z6830 Body mass index (BMI) 30.0-30.9, adult: Secondary | ICD-10-CM

## 2018-11-01 DIAGNOSIS — C182 Malignant neoplasm of ascending colon: Secondary | ICD-10-CM

## 2018-11-01 DIAGNOSIS — I69320 Aphasia following cerebral infarction: Secondary | ICD-10-CM

## 2018-11-01 DIAGNOSIS — S0181XA Laceration without foreign body of other part of head, initial encounter: Secondary | ICD-10-CM | POA: Diagnosis not present

## 2018-11-01 DIAGNOSIS — Z79890 Hormone replacement therapy (postmenopausal): Secondary | ICD-10-CM

## 2018-11-01 DIAGNOSIS — I69351 Hemiplegia and hemiparesis following cerebral infarction affecting right dominant side: Secondary | ICD-10-CM

## 2018-11-01 DIAGNOSIS — R58 Hemorrhage, not elsewhere classified: Secondary | ICD-10-CM | POA: Diagnosis not present

## 2018-11-01 DIAGNOSIS — E669 Obesity, unspecified: Secondary | ICD-10-CM | POA: Diagnosis present

## 2018-11-01 DIAGNOSIS — I4510 Unspecified right bundle-branch block: Secondary | ICD-10-CM | POA: Diagnosis present

## 2018-11-01 DIAGNOSIS — I5030 Unspecified diastolic (congestive) heart failure: Secondary | ICD-10-CM | POA: Diagnosis present

## 2018-11-01 DIAGNOSIS — R21 Rash and other nonspecific skin eruption: Secondary | ICD-10-CM | POA: Diagnosis not present

## 2018-11-01 DIAGNOSIS — E860 Dehydration: Secondary | ICD-10-CM | POA: Diagnosis present

## 2018-11-01 DIAGNOSIS — I5032 Chronic diastolic (congestive) heart failure: Secondary | ICD-10-CM | POA: Diagnosis not present

## 2018-11-01 DIAGNOSIS — I442 Atrioventricular block, complete: Secondary | ICD-10-CM | POA: Diagnosis present

## 2018-11-01 DIAGNOSIS — Z953 Presence of xenogenic heart valve: Secondary | ICD-10-CM

## 2018-11-01 DIAGNOSIS — I10 Essential (primary) hypertension: Secondary | ICD-10-CM | POA: Diagnosis not present

## 2018-11-01 DIAGNOSIS — Z91048 Other nonmedicinal substance allergy status: Secondary | ICD-10-CM

## 2018-11-01 DIAGNOSIS — S0990XA Unspecified injury of head, initial encounter: Secondary | ICD-10-CM | POA: Diagnosis not present

## 2018-11-01 DIAGNOSIS — I951 Orthostatic hypotension: Secondary | ICD-10-CM | POA: Diagnosis present

## 2018-11-01 DIAGNOSIS — K219 Gastro-esophageal reflux disease without esophagitis: Secondary | ICD-10-CM | POA: Diagnosis present

## 2018-11-01 DIAGNOSIS — E7800 Pure hypercholesterolemia, unspecified: Secondary | ICD-10-CM | POA: Diagnosis not present

## 2018-11-01 DIAGNOSIS — Z7902 Long term (current) use of antithrombotics/antiplatelets: Secondary | ICD-10-CM

## 2018-11-01 DIAGNOSIS — I350 Nonrheumatic aortic (valve) stenosis: Secondary | ICD-10-CM

## 2018-11-01 DIAGNOSIS — H543 Unqualified visual loss, both eyes: Secondary | ICD-10-CM | POA: Diagnosis present

## 2018-11-01 DIAGNOSIS — Z808 Family history of malignant neoplasm of other organs or systems: Secondary | ICD-10-CM

## 2018-11-01 DIAGNOSIS — Z8673 Personal history of transient ischemic attack (TIA), and cerebral infarction without residual deficits: Secondary | ICD-10-CM

## 2018-11-01 DIAGNOSIS — R270 Ataxia, unspecified: Secondary | ICD-10-CM | POA: Diagnosis not present

## 2018-11-01 DIAGNOSIS — E039 Hypothyroidism, unspecified: Secondary | ICD-10-CM | POA: Diagnosis not present

## 2018-11-01 DIAGNOSIS — E785 Hyperlipidemia, unspecified: Secondary | ICD-10-CM | POA: Diagnosis present

## 2018-11-01 DIAGNOSIS — M1990 Unspecified osteoarthritis, unspecified site: Secondary | ICD-10-CM | POA: Diagnosis present

## 2018-11-01 DIAGNOSIS — I959 Hypotension, unspecified: Secondary | ICD-10-CM | POA: Diagnosis not present

## 2018-11-01 DIAGNOSIS — Z811 Family history of alcohol abuse and dependence: Secondary | ICD-10-CM

## 2018-11-01 DIAGNOSIS — Z950 Presence of cardiac pacemaker: Secondary | ICD-10-CM

## 2018-11-01 DIAGNOSIS — H547 Unspecified visual loss: Secondary | ICD-10-CM | POA: Diagnosis present

## 2018-11-01 DIAGNOSIS — Z841 Family history of disorders of kidney and ureter: Secondary | ICD-10-CM

## 2018-11-01 DIAGNOSIS — Z87891 Personal history of nicotine dependence: Secondary | ICD-10-CM

## 2018-11-01 DIAGNOSIS — I6522 Occlusion and stenosis of left carotid artery: Secondary | ICD-10-CM | POA: Diagnosis present

## 2018-11-01 DIAGNOSIS — Z8249 Family history of ischemic heart disease and other diseases of the circulatory system: Secondary | ICD-10-CM

## 2018-11-01 DIAGNOSIS — Z888 Allergy status to other drugs, medicaments and biological substances status: Secondary | ICD-10-CM

## 2018-11-01 LAB — COMPREHENSIVE METABOLIC PANEL
ALT: 13 U/L (ref 0–44)
AST: 26 U/L (ref 15–41)
Albumin: 3.4 g/dL — ABNORMAL LOW (ref 3.5–5.0)
Alkaline Phosphatase: 57 U/L (ref 38–126)
Anion gap: 11 (ref 5–15)
BUN: 21 mg/dL (ref 8–23)
CO2: 23 mmol/L (ref 22–32)
Calcium: 9.3 mg/dL (ref 8.9–10.3)
Chloride: 106 mmol/L (ref 98–111)
Creatinine, Ser: 1.05 mg/dL — ABNORMAL HIGH (ref 0.44–1.00)
GFR calc Af Amer: 57 mL/min — ABNORMAL LOW (ref >60)
GFR calc non Af Amer: 49 mL/min — ABNORMAL LOW (ref >60)
Glucose, Bld: 87 mg/dL (ref 70–99)
Potassium: 4.2 mmol/L (ref 3.5–5.1)
Sodium: 140 mmol/L (ref 135–145)
Total Bilirubin: 0.8 mg/dL (ref 0.3–1.2)
Total Protein: 6.4 g/dL — ABNORMAL LOW (ref 6.5–8.1)

## 2018-11-01 LAB — CBC WITH DIFFERENTIAL/PLATELET
Abs Immature Granulocytes: 0.04 10*3/uL (ref 0.00–0.07)
Basophils Absolute: 0 10*3/uL (ref 0.0–0.1)
Basophils Relative: 0 %
Eosinophils Absolute: 0.4 10*3/uL (ref 0.0–0.5)
Eosinophils Relative: 5 %
HCT: 42.9 % (ref 36.0–46.0)
Hemoglobin: 13.2 g/dL (ref 12.0–15.0)
Immature Granulocytes: 0 %
Lymphocytes Relative: 7 %
Lymphs Abs: 0.7 10*3/uL (ref 0.7–4.0)
MCH: 28.4 pg (ref 26.0–34.0)
MCHC: 30.8 g/dL (ref 30.0–36.0)
MCV: 92.3 fL (ref 80.0–100.0)
Monocytes Absolute: 0.3 10*3/uL (ref 0.1–1.0)
Monocytes Relative: 3 %
Neutro Abs: 8 10*3/uL — ABNORMAL HIGH (ref 1.7–7.7)
Neutrophils Relative %: 85 %
Platelets: 282 10*3/uL (ref 150–400)
RBC: 4.65 MIL/uL (ref 3.87–5.11)
RDW: 18.4 % — ABNORMAL HIGH (ref 11.5–15.5)
WBC: 9.5 10*3/uL (ref 4.0–10.5)
nRBC: 0 % (ref 0.0–0.2)

## 2018-11-01 LAB — LACTIC ACID, PLASMA: Lactic Acid, Venous: 0.7 mmol/L (ref 0.5–1.9)

## 2018-11-01 LAB — SARS CORONAVIRUS 2 (TAT 6-24 HRS): SARS Coronavirus 2: NEGATIVE

## 2018-11-01 LAB — TSH: TSH: 3.742 u[IU]/mL (ref 0.350–4.500)

## 2018-11-01 LAB — MAGNESIUM: Magnesium: 1.9 mg/dL (ref 1.7–2.4)

## 2018-11-01 MED ORDER — SIMVASTATIN 20 MG PO TABS
40.0000 mg | ORAL_TABLET | Freq: Every day | ORAL | Status: DC
  Administered 2018-11-02: 40 mg via ORAL
  Filled 2018-11-01: qty 2

## 2018-11-01 MED ORDER — LORATADINE 10 MG PO TABS
10.0000 mg | ORAL_TABLET | Freq: Every day | ORAL | Status: DC
  Administered 2018-11-02 – 2018-11-03 (×2): 10 mg via ORAL
  Filled 2018-11-01 (×3): qty 1

## 2018-11-01 MED ORDER — SODIUM CHLORIDE 0.9 % IV BOLUS
1000.0000 mL | Freq: Once | INTRAVENOUS | Status: AC
  Administered 2018-11-01: 1000 mL via INTRAVENOUS

## 2018-11-01 MED ORDER — SODIUM CHLORIDE 0.9 % IV SOLN
INTRAVENOUS | Status: AC
  Administered 2018-11-01: 17:00:00 via INTRAVENOUS

## 2018-11-01 MED ORDER — ONDANSETRON HCL 4 MG PO TABS: 4.0000 mg | ORAL_TABLET | Freq: Four times a day (QID) | ORAL | Status: DC | PRN

## 2018-11-01 MED ORDER — ONDANSETRON HCL 4 MG/2ML IJ SOLN: 4.0000 mg | Freq: Four times a day (QID) | INTRAMUSCULAR | Status: DC | PRN

## 2018-11-01 MED ORDER — FERROUS SULFATE 325 (65 FE) MG PO TABS
325.0000 mg | ORAL_TABLET | ORAL | Status: DC
  Administered 2018-11-01 – 2018-11-03 (×2): 325 mg via ORAL
  Filled 2018-11-01 (×2): qty 1

## 2018-11-01 MED ORDER — ACETAMINOPHEN 325 MG PO TABS: 650.0000 mg | ORAL_TABLET | Freq: Four times a day (QID) | ORAL | Status: DC | PRN

## 2018-11-01 MED ORDER — ACETAMINOPHEN 650 MG RE SUPP: 650.0000 mg | Freq: Four times a day (QID) | RECTAL | Status: DC | PRN

## 2018-11-01 MED ORDER — PANTOPRAZOLE SODIUM 40 MG PO TBEC
40.0000 mg | DELAYED_RELEASE_TABLET | Freq: Every day | ORAL | Status: DC
  Administered 2018-11-02 – 2018-11-03 (×2): 40 mg via ORAL
  Filled 2018-11-01 (×2): qty 1

## 2018-11-01 MED ORDER — APIXABAN 5 MG PO TABS
5.0000 mg | ORAL_TABLET | Freq: Two times a day (BID) | ORAL | Status: DC
  Administered 2018-11-01 – 2018-11-03 (×4): 5 mg via ORAL
  Filled 2018-11-01 (×4): qty 1

## 2018-11-01 MED ORDER — LEVOTHYROXINE SODIUM 100 MCG PO TABS
100.0000 ug | ORAL_TABLET | Freq: Every day | ORAL | Status: DC
  Administered 2018-11-02 – 2018-11-03 (×2): 100 ug via ORAL
  Filled 2018-11-01 (×3): qty 1

## 2018-11-01 NOTE — Telephone Encounter (Signed)
Spoke with Maudie Mercury, RN at [facility] ED. Assisted with [facility] transmission. Transmission reviewed. Presenting rhythm AP/VS @ 60bpm. Lead trends stable. AP 58.5%, VP 18.1%. 17 AT/AF episodes (0.1% burden), longest 2min 25sec. No ventricular arrhythmias. Renold Don, RN, that PPM transmission shows normal device function. No questions at this time.

## 2018-11-01 NOTE — H&P (Signed)
History and Physical    Julie Mcgee [id number] DOB: 1933-03-03 DOA: 11/01/2018  PCP: Lyman Bishop, DO  Patient coming from: Home  I have personally briefly reviewed patient's old medical records in [place]  Chief Complaint: Syncope HPI: Julie Mcgee is a 85 y.o. female with medical history significant of severe aortic stenosis status post TAVR on 02/2018-on aspirin and Plavix, hypertension, hyperlipidemia, hypothyroidism, peripheral vascular disease, carotid artery stenosis, chronic diastolic congestive heart failure with preserved ejection fraction of 60 to 65%, previous CVA, complete heart block status post pacemaker placement, invasive adenocarcinoma of colon status post hemicolectomy on 05/24/2018 presents to emergency department for syncope.  Patient reports that she was brushing her teeth and fell this morning and hit on her head.  Her husband called EMS and brought patient to the emergency department for further evaluation and management.  Reports nausea before the fall however denies association with seizures, headache, blurry vision, lightheadedness, nausea, vomiting, diarrhea, fever, chills, chest pain, shortness of breath, palpitation, leg swelling.  Patient was seen in the ED yesterday with dizziness.  MRI was not able to perform due to pacemaker.  CTA of head and neck was obtained which showed bilateral stenosis of ICA.  Possibility 80% of left ICA.  EDP talk to the neurology and schedule patient for outpatient follow-up.  Has rash on left upper arm-due to blood pressure cuff.  Denies association with itching, fever or chills.  ED Course: Upon arrival: She had a laceration over her left eye which were repaired in the ED.  Her orthostatic vitals were positive.  IV fluids given.  CT head without contrast was obtained which came back negative for acute findings.  Review of Systems: As per HPI otherwise negative.    Past Medical History:  Diagnosis Date    Arthritis    Carotid artery stenosis    Chronic diastolic CHF (congestive heart failure) (HCC)    Family history of skin cancer    Former smoker    1/2 ppd for 45 years   History of CVA (cerebrovascular accident) 2011   with residual R hemiparesis   Hyperlipidemia    Hypertension    Hypothyroidism    Peripheral vascular disease ([place])    RBBB    S/P TAVR (transcatheter aortic valve replacement) 02/28/2018   23 mm Edwards Sapien 3 transcatheter heart valve placed via percutaneous right transfemoral approach    Severe aortic stenosis     Past Surgical History:  Procedure Laterality Date   BASAL CELL CARCINOMA EXCISION  2018   BIOPSY  05/19/2018   Procedure: BIOPSY;  Surgeon: Lavena Bullion, DO;  Location: [place];  Service: Gastroenterology;;   CARDIAC CATHETERIZATION     COLONOSCOPY  2001   COLONOSCOPY WITH PROPOFOL N/A 05/19/2018   Procedure: COLONOSCOPY WITH PROPOFOL;  Surgeon: Lavena Bullion, DO;  Location: [place];  Service: Gastroenterology;  Laterality: N/A;   ESOPHAGOGASTRODUODENOSCOPY (EGD) WITH PROPOFOL N/A 05/19/2018   Procedure: ESOPHAGOGASTRODUODENOSCOPY (EGD) WITH PROPOFOL;  Surgeon: Lavena Bullion, DO;  Location: [place];  Service: Gastroenterology;  Laterality: N/A;   HIP SURGERY  2005   KNEE SURGERY  1998   LAPAROSCOPIC PARTIAL COLECTOMY N/A 05/24/2018   Procedure: LAPAROSCOPIC ASSISTED ASCENDING HEMICOLECTOMY;  Surgeon: Ileana Roup, MD;  Location: [place];  Service: General;  Laterality: N/A;   PACEMAKER IMPLANT N/A 03/02/2018   Procedure: PACEMAKER IMPLANT;  Surgeon: Evans Lance, MD;  Location: [facility];  Service: Cardiovascular;  Laterality: N/A;  RIGHT/LEFT HEART CATH AND CORONARY ANGIOGRAPHY N/A 02/21/2018   Procedure: RIGHT/LEFT HEART CATH AND CORONARY ANGIOGRAPHY;  Surgeon: Sherren Mocha, MD;  Location: [facility];  Service: Cardiovascular;  Laterality: N/A;   SUBMUCOSAL TATTOO INJECTION   05/19/2018   Procedure: SUBMUCOSAL TATTOO INJECTION;  Surgeon: Lavena Bullion, DO;  Location: [place];  Service: Gastroenterology;;   TEE WITHOUT CARDIOVERSION N/A 02/28/2018   Procedure: TRANSESOPHAGEAL ECHOCARDIOGRAM (TEE);  Surgeon: Sherren Mocha, MD;  Location: [place] [facility];  Service: Open Heart Surgery;  Laterality: N/A;   TEMPORARY PACEMAKER N/A 03/01/2018   Procedure: TEMPORARY PACEMAKER;  Surgeon: Burnell Blanks, MD;  Location: [facility];  Service: Cardiovascular;  Laterality: N/A;   TONSILLECTOMY     TRANSCATHETER AORTIC VALVE REPLACEMENT, TRANSFEMORAL N/A 02/28/2018   Procedure: TRANSCATHETER AORTIC VALVE REPLACEMENT, TRANSFEMORAL;  Surgeon: Sherren Mocha, MD;  Location: [facility];  Service: Open Heart Surgery;  Laterality: N/A;     reports that she quit smoking about 9 years ago. Her smoking use included cigarettes. She has a 45.00 pack-year smoking history. She has never used smokeless tobacco. She reports previous alcohol use. She reports that she does not use drugs.  Allergies  Allergen Reactions   Adhesive [Tape] Other (See Comments)    Causes BLISTERS    Family History  Problem Relation Age of Onset   Heart failure Mother    Chronic Renal Failure Mother    Alcohol abuse Father    Skin cancer Sister 45   Skin cancer Sister 38    Prior to Admission medications   Medication Sig Start Date End Date Taking? Authorizing Provider  amoxicillin (AMOXIL) 500 MG tablet Please take 2,000 mg (4 tablets) one hour prior to all dental procedures. 03/16/18   Eileen Stanford, PA-C  apixaban (ELIQUIS) 5 MG TABS tablet Take 1 tablet (5 mg total) by mouth 2 (two) times daily. 09/20/18   Sueanne Margarita, MD  Calcium Carb-Cholecalciferol (CALTRATE 600+D3 PO) Take 1 tablet by mouth 2 (two) times daily.    [provider]  diphenhydrAMINE (BENADRYL) 25 MG tablet Take 25 mg by mouth every 6 (six) hours as needed.    [provider]  ferrous sulfate 325 (65 FE) MG tablet Take 1 tablet (325 mg total) by mouth every other day. 05/27/18   Shirley, [place], DO  furosemide (LASIX) 20 MG tablet Take 1 tablet (20 mg total) by mouth daily as needed. 08/02/18 08/02/19  Sueanne Margarita, MD  levothyroxine (SYNTHROID) 100 MCG tablet Take 100 mcg by mouth daily before breakfast.     [provider]  Melatonin 5 MG CAPS Take 5 mg by mouth at bedtime as needed (sleep).     [provider]  metoprolol tartrate (LOPRESSOR) 50 MG tablet Take 50 mg by mouth 2 (two) times daily.    [provider]  Multiple Vitamins-Minerals (MULTIVITAMIN ADULTS 50+) TABS Take 1 tablet by mouth daily.    [provider]  pantoprazole (PROTONIX) 40 MG tablet Take 1 tablet (40 mg total) by mouth daily. 05/28/18   Shirley, [place], DO  saccharomyces boulardii (FLORASTOR) 250 MG capsule Take 250 mg by mouth 2 (two) times daily.    [provider]  simvastatin (ZOCOR) 40 MG tablet Take 1 tablet (40 mg total) by mouth at bedtime. 06/20/18 10/31/18  Sueanne Margarita, MD    Physical Exam: Vitals:   11/01/18 1245 11/01/18 1300 11/01/18 1315 11/01/18 1330  BP: (!) 159/57 [name])  160/57 (!) 164/65 132/68  Pulse: 60 61  60  Resp: 15 12 19 19   Temp:      TempSrc:      SpO2: 99% 99%  100%  Weight:      Height:        Constitutional: NAD, calm, comfortable Vitals:   11/01/18 1245 11/01/18 1300 11/01/18 1315 11/01/18 1330  BP: (!) 159/57 (!) 160/57 (!) 164/65 132/68  Pulse: 60 61  60  Resp: 15 12 19 19   Temp:      TempSrc:      SpO2: 99% 99%  100%  Weight:      Height:       Constitutional: Alert and oriented x4, not in acute distress, communicating well.   Eyes: PERRL, lids and conjunctivae normal ENMT: Mucous membranes are moist. Posterior pharynx clear of any exudate or lesions.Normal dentition.  Neck: normal, supple, no masses, no thyromegaly Respiratory: clear to auscultation bilaterally, no wheezing, no  crackles. Normal respiratory effort. No accessory muscle use.  Cardiovascular: Regular rate and rhythm, no murmurs / rubs / gallops. No extremity edema. 2+ pedal pulses. No carotid bruits.  Abdomen: no tenderness, no masses palpated. No hepatosplenomegaly. Bowel sounds positive.  Musculoskeletal: no clubbing / cyanosis. No joint deformity upper and lower extremities. Good ROM, no contractures. Normal muscle tone.  Skin: Erythematous rash noted on left upper arm.   Neurologic: CN 2-12 grossly intact. Sensation intact, DTR normal. Strength 5/5 in all 4.  Psychiatric: Normal judgment and insight. Alert and oriented x 3. Normal mood.    Labs on Admission: I have personally reviewed following labs and imaging studies  CBC: Recent Labs  Lab 10/31/18 1106 11/01/18 1328  WBC 9.2 9.5  NEUTROABS 6.8 8.0*  HGB 12.6 13.2  HCT 40.3 42.9  MCV 90.8 92.3  PLT 267 [id number]   Basic Metabolic Panel: Recent Labs  Lab 10/31/18 1106 11/01/18 1328  NA 139 140  K 4.0 4.2  CL 105 106  CO2 24 23  GLUCOSE 75 87  BUN 18 21  CREATININE 1.09* 1.05*  CALCIUM 9.7 9.3   GFR: Estimated Creatinine Clearance: 38.6 mL/min (A) (by C-G formula based on SCr of 1.05 mg/dL (H)). Liver Function Tests: Recent Labs  Lab 10/31/18 1106 11/01/18 1328  AST 21 26  ALT 12 13  ALKPHOS 63 57  BILITOT 0.7 0.8  PROT 7.3 6.4*  ALBUMIN 3.8 3.4*   No results for input(s): LIPASE, AMYLASE in the last 168 hours. No results for input(s): AMMONIA in the last 168 hours. Coagulation Profile: Recent Labs  Lab 10/31/18 1106  INR 1.4*   Cardiac Enzymes: No results for input(s): CKTOTAL, CKMB, CKMBINDEX, TROPONINI in the last 168 hours. BNP (last 3 results) No results for input(s): PROBNP in the last 8760 hours. HbA1C: No results for input(s): HGBA1C in the last 72 hours. CBG: Recent Labs  Lab 10/31/18 1144  GLUCAP 83   Lipid Profile: No results for input(s): CHOL, HDL, LDLCALC, TRIG, CHOLHDL, LDLDIRECT in the last  72 hours. Thyroid Function Tests: No results for input(s): TSH, T4TOTAL, FREET4, T3FREE, THYROIDAB in the last 72 hours. Anemia Panel: No results for input(s): VITAMINB12, FOLATE, FERRITIN, TIBC, IRON, RETICCTPCT in the last 72 hours. Urine analysis:    Component Value Date/Time   COLORURINE YELLOW 10/31/2018 1251   APPEARANCEUR HAZY (A) 10/31/2018 1251   LABSPEC 1.016 10/31/2018 1251   PHURINE 6.0 10/31/2018 1251   GLUCOSEU NEGATIVE 10/31/2018 1251   [place] 10/31/2018 1251  BILIRUBINUR NEGATIVE 10/31/2018 [place] 10/31/2018 1251   PROTEINUR NEGATIVE 10/31/2018 1251   NITRITE NEGATIVE 10/31/2018 1251   LEUKOCYTESUR SMALL (A) 10/31/2018 1251    Radiological Exams on Admission: Ct Angio Head W Or Wo Contrast  Result Date: 10/31/2018 CLINICAL DATA:  Dizziness and intermittent blurred vision. EXAM: CT ANGIOGRAPHY HEAD AND NECK TECHNIQUE: Multidetector CT imaging of the head and neck was performed using the standard protocol during bolus administration of intravenous contrast. Multiplanar CT image reconstructions and MIPs were obtained to evaluate the vascular anatomy. Carotid stenosis measurements (when applicable) are obtained utilizing NASCET criteria, using the distal internal carotid diameter as the denominator. CONTRAST:  55mL OMNIPAQUE IOHEXOL 350 MG/ML SOLN COMPARISON:  Head CT same day FINDINGS: CTA NECK FINDINGS Aortic arch: Aortic atherosclerosis. No aneurysm or dissection. Branching pattern is normal. 50% stenosis of the left subclavian artery origin. Innominate and left common carotid origin widely patent. Right carotid system: Common carotid artery widely patent to the bifurcation. Calcified plaque at the carotid bifurcation and ICA bulb. Minimal diameter in the ICA bulb measures 3.5 mm. Compared to a more distal cervical ICA diameter of 5 mm, this indicates a 30% stenosis. The distal ICA is tortuous but widely patent. Left carotid system: Common carotid  artery is tortuous but widely patent to the bifurcation region. There is calcified plaque at the bifurcation and in the ICA bulb. Detail is limited secondary to artifact from jewelry that the patient refused to remove. At the distal ICA bulb, minimal diameter measures 1 mm. Compared to a more distal cervical ICA diameter of 5 mm, this indicates an 80% stenosis. Vertebral arteries: Left vertebral artery shows calcified plaque at its origin with stenosis of 30%. Beyond that, the vessel is widely patent through the cervical region. The right vertebral artery is occluded at its origin. There is reconstituted flow in the upper cervical region. Skeleton: Degenerative spondylosis. Other neck: Lipoma in the lower intrinsic neck musculature on the right. Otherwise no soft tissue neck finding. Upper chest: No active upper chest process evident. Review of the MIP images confirms the above findings CTA HEAD FINDINGS Anterior circulation: Both internal carotid arteries are patent through the skull base and siphon regions. There is extensive atherosclerotic calcification in the carotid siphon regions and supraclinoid internal carotid arteries. Stenosis is estimated at 50% in the supraclinoid regions. The anterior and middle cerebral vessels are patent without proximal stenosis, aneurysm or vascular malformation. No large or medium vessel occlusion is identified. Posterior circulation: Left vertebral artery is patent through the foramen magnum to the basilar. No basilar stenosis. Superior cerebellar and posterior cerebral arteries are patent. There are patent bilateral posterior communicating arteries. As noted above, right vertebral artery is a tiny vessel with reconstituted flow that appears to give some contribution the basilar. Flow does appear to be present in right PICA presently. Venous sinuses: Patent and normal. Anatomic variants: None other significant. Review of the MIP images confirms the above findings IMPRESSION:  Atherosclerotic disease at both carotid bifurcations. 30% stenosis of the proximal ICA on the right. 80% stenosis in the ICA bulb on the left. Atherosclerotic disease in both carotid siphon regions and supraclinoid internal carotid arteries. Maximal stenosis 50% on both sides. Beyond that, no intracranial large or medium vessel occlusion or correctable proximal stenosis is seen. 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 30% stenosis of the left vertebral artery origin. Reconstituted flow in a tiny distal right vertebral artery at the skull base level gives minimal  contribution to the basilar. No basilar stenosis or posterior circulation branch vessel occlusion. Electronically Signed   By: Nelson Chimes M.D.   On: 10/31/2018 17:25   Ct Head Wo Contrast  Result Date: 11/01/2018 CLINICAL DATA:  85 year old female with head trauma and ataxia. EXAM: CT HEAD WITHOUT CONTRAST TECHNIQUE: Contiguous axial images were obtained from the base of the skull through the vertex without intravenous contrast. COMPARISON:  Head CT dated 10/31/2018 FINDINGS: Brain: Mild age-related atrophy and moderate chronic microvascular ischemic changes. Large areas of old infarct and encephalomalacia noted in the cerebellar hemispheres. There is no acute intracranial hemorrhage. No mass effect or midline shift. No extra-axial fluid collection. Vascular: No hyperdense vessel or unexpected calcification. Skull: Normal. Negative for fracture or focal lesion. Sinuses/Orbits: No acute finding. Other: None IMPRESSION: 1. No acute intracranial hemorrhage. 2. Age-related atrophy and chronic microvascular ischemic changes. Large areas of old infarct and encephalomalacia in the cerebellar hemispheres. Electronically Signed   By: Anner Crete M.D.   On: 11/01/2018 12:40   Ct Head Wo Contrast  Result Date: 10/31/2018 CLINICAL DATA:  Dizziness EXAM: CT HEAD WITHOUT CONTRAST TECHNIQUE: Contiguous axial images were obtained from the base of the  skull through the vertex without intravenous contrast. COMPARISON:  10/14/2017 FINDINGS: Brain: Large old bilateral cerebellar infarcts, stable. There is atrophy and chronic small vessel disease changes. No acute intracranial abnormality. Specifically, no hemorrhage, hydrocephalus, mass lesion, acute infarction, or significant intracranial injury. Vascular: No hyperdense vessel or unexpected calcification. Skull: No acute calvarial abnormality. Sinuses/Orbits: Visualized paranasal sinuses and mastoids clear. Orbital soft tissues unremarkable. Other: None IMPRESSION: Large old bilateral cerebellar infarcts and encephalomalacia. Atrophy, chronic microvascular disease. No acute intracranial abnormality. Electronically Signed   By: Rolm Baptise M.D.   On: 10/31/2018 11:20   Ct Angio Neck W And/or Wo Contrast  Result Date: 10/31/2018 CLINICAL DATA:  Dizziness and intermittent blurred vision. EXAM: CT ANGIOGRAPHY HEAD AND NECK TECHNIQUE: Multidetector CT imaging of the head and neck was performed using the standard protocol during bolus administration of intravenous contrast. Multiplanar CT image reconstructions and MIPs were obtained to evaluate the vascular anatomy. Carotid stenosis measurements (when applicable) are obtained utilizing NASCET criteria, using the distal internal carotid diameter as the denominator. CONTRAST:  29mL OMNIPAQUE IOHEXOL 350 MG/ML SOLN COMPARISON:  Head CT same day FINDINGS: CTA NECK FINDINGS Aortic arch: Aortic atherosclerosis. No aneurysm or dissection. Branching pattern is normal. 50% stenosis of the left subclavian artery origin. Innominate and left common carotid origin widely patent. Right carotid system: Common carotid artery widely patent to the bifurcation. Calcified plaque at the carotid bifurcation and ICA bulb. Minimal diameter in the ICA bulb measures 3.5 mm. Compared to a more distal cervical ICA diameter of 5 mm, this indicates a 30% stenosis. The distal ICA is tortuous but  widely patent. Left carotid system: Common carotid artery is tortuous but widely patent to the bifurcation region. There is calcified plaque at the bifurcation and in the ICA bulb. Detail is limited secondary to artifact from jewelry that the patient refused to remove. At the distal ICA bulb, minimal diameter measures 1 mm. Compared to a more distal cervical ICA diameter of 5 mm, this indicates an 80% stenosis. Vertebral arteries: Left vertebral artery shows calcified plaque at its origin with stenosis of 30%. Beyond that, the vessel is widely patent through the cervical region. The right vertebral artery is occluded at its origin. There is reconstituted flow in the upper cervical region. Skeleton: Degenerative spondylosis. Other neck:  Lipoma in the lower intrinsic neck musculature on the right. Otherwise no soft tissue neck finding. Upper chest: No active upper chest process evident. Review of the MIP images confirms the above findings CTA HEAD FINDINGS Anterior circulation: Both internal carotid arteries are patent through the skull base and siphon regions. There is extensive atherosclerotic calcification in the carotid siphon regions and supraclinoid internal carotid arteries. Stenosis is estimated at 50% in the supraclinoid regions. The anterior and middle cerebral vessels are patent without proximal stenosis, aneurysm or vascular malformation. No large or medium vessel occlusion is identified. Posterior circulation: Left vertebral artery is patent through the foramen magnum to the basilar. No basilar stenosis. Superior cerebellar and posterior cerebral arteries are patent. There are patent bilateral posterior communicating arteries. As noted above, right vertebral artery is a tiny vessel with reconstituted flow that appears to give some contribution the basilar. Flow does appear to be present in right PICA presently. Venous sinuses: Patent and normal. Anatomic variants: None other significant. Review of the MIP  images confirms the above findings IMPRESSION: Atherosclerotic disease at both carotid bifurcations. 30% stenosis of the proximal ICA on the right. 80% stenosis in the ICA bulb on the left. Atherosclerotic disease in both carotid siphon regions and supraclinoid internal carotid arteries. Maximal stenosis 50% on both sides. Beyond that, no intracranial large or medium vessel occlusion or correctable proximal stenosis is seen. 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 30% stenosis of the left vertebral artery origin. Reconstituted flow in a tiny distal right vertebral artery at the skull base level gives minimal contribution to the basilar. No basilar stenosis or posterior circulation branch vessel occlusion. Electronically Signed   By: Nelson Chimes M.D.   On: 10/31/2018 17:25   Dg Chest Port 1 View  Result Date: 10/31/2018 CLINICAL DATA:  Pt having weakness, dizziness for 2 weeks, syncope yesterday - hx of CHF, htn, CVA, TAVR, exsmoker, RBBBcough, weakness EXAM: PORTABLE CHEST 1 VIEW COMPARISON:  03/03/2018 FINDINGS: LEFT-sided pacemaker overlies stable cardiac silhouette. Lungs are mildly hyperinflated. No effusion, infiltrate pneumothorax. No acute osseous abnormality. IMPRESSION: No acute cardiopulmonary process. Electronically Signed   By: Suzy Bouchard M.D.   On: 10/31/2018 12:47    EKG: Paced rhythm.  No ST elevation or depression. Assessment/Plan Principal Problem:   Syncope Active Problems:   Hypertension   Hyperlipidemia   Hypothyroidism   Severe aortic stenosis   History of stroke   S/P TAVR (transcatheter aortic valve replacement)   Complete heart block (HCC)   Malignant neoplasm of ascending colon (HCC)   Chronic diastolic CHF (congestive heart failure) (HCC)   Rash   Syncope: -Likely secondary to orthostatic hypotension secondary to dehydration. -Patient orthostatic vitals positive in the ED.  CT head without contrast came back negative.  Reviewed CTA of head and neck  from yesterday.  CBC: WNL.  CMP: CKD at baseline.  Will check lactic acid level, magnesium level and TSH. -We will admit patient for observation.  Continue IV fluids.  Will hold metoprolol for now. -We will monitor her vital signs placed her on telemetry. -Consulted physical therapy  Laceration over left eye: -S/p fall.  Repaired in the ED. -Tylenol as needed for pain.  Hypertension: -Patient orthostatic vitals positive in ED. -Hold metoprolol for now.  Monitor blood pressure closely.  Hyperlipidemia: -Continue statin.  Chronic diastolic congestive heart failure: Not in acute exacerbation. - With preserved ejection fraction of 60 to 65% -No signs of fluid overload. -Strict INO's and daily weight.  Continue home meds. -She takes Lasix 20 mg as needed for leg edema. -Continue aspirin and statin  Complete heart block: Status post pacemaker: Aware -On telemetry.    Severe aortic stenosis status post TAVR: Aware -We will continue aspirin and Eliquis.  GERD: Stable continue Protonix  Hypothyroidism: Check TSH -Continue levothyroxine  CKD stage III: At baseline -Monitor BMP.  Avoid nephrotoxic medication.  Rash: On left upper arm -Due to blood pressure cuff -We will start on nonsedating antiallergic.  History of adenocarcinoma of colon: Status post hemicolectomy on 05/2018. -H&H is stable.   DVT prophylaxis: TED/SCD/Eliquis  code Status: Full code-confirmed with the patient Family Communication: None present at bedside.  Plan of care discussed with patient in length and he verbalized understanding and agreed with it. Disposition Plan: TBD Consults called: None Admission status: Observation  Mckinley Jewel MD [facility] Pager [phone]  If 7PM-7AM, please contact night-coverage [email or web address] Password [facility]  11/01/2018, 3:57 PM

## 2018-11-01 NOTE — Plan of Care (Signed)
  Problem: Clinical Measurements: Goal: Ability to maintain clinical measurements within normal limits will improve Outcome: Progressing   Problem: Clinical Measurements: Goal: Cardiovascular complication will be avoided Outcome: Progressing   Problem: Coping: Goal: Level of anxiety will decrease Outcome: Progressing   

## 2018-11-01 NOTE — ED Notes (Signed)
PA at bedside.

## 2018-11-01 NOTE — ED Provider Notes (Signed)
[facility] EMERGENCY DEPARTMENT Provider Note   CSN: [id number] Arrival date & time: 11/01/18  1005     History   Chief Complaint Chief Complaint  Patient presents with   Loss of Consciousness   Fall    HPI Julie Mcgee is a 85 y.o. female.     The history is provided by the patient. No language interpreter was used.  Fall This is a new problem. The current episode started less than 1 hour ago. The problem has not changed since onset.Nothing aggravates the symptoms. The treatment provided no relief.  Pt was seen here yesterday for dizziness/  Pt reports she was brushing her teeth and fell hitting her head.  Pt reports she has been dizzy for the past 3 weeks.  Pt complains of a knot on the back of her head and a cut on her face.  Pt had labs and ct/cta yesterday.  Pt reports she is schedule to see the cardiologist today.  Pt states she knows her carotid arteries are bad.   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   23 mm Edwards Sapien 3 transcatheter heart valve placed via percutaneous right transfemoral approach    Severe aortic stenosis     Patient Active Problem List   Diagnosis Date Noted   Family history of skin cancer    Chronic diastolic CHF (congestive heart failure) ([place]) 06/19/2018   Breast lesion 06/07/2018   Malignant neoplasm of ascending colon (HCC)    Colonic mass    Hiatal hernia    Gastrointestinal hemorrhage    Anticoagulated    Acute upper GI bleed 05/17/2018   Symptomatic anemia    Complete  heart block ([place]) 03/02/2018   Sinus bradycardia    Cardiac arrest (HCC)    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;  Service: Gastroenterology;;   CARDIAC CATHETERIZATION     COLONOSCOPY  2001   COLONOSCOPY WITH PROPOFOL N/A 05/19/2018   Procedure: COLONOSCOPY WITH PROPOFOL;  Surgeon: Lavena Bullion, DO;  Location: [facility];  Service: Gastroenterology;  Laterality: N/A;   ESOPHAGOGASTRODUODENOSCOPY (EGD) WITH PROPOFOL N/A 05/19/2018   Procedure: ESOPHAGOGASTRODUODENOSCOPY (EGD) WITH PROPOFOL;  Surgeon: Lavena Bullion, DO;  Location: [facility];  Service: Gastroenterology;  Laterality: N/A;   HIP SURGERY  2005   KNEE SURGERY  1998   LAPAROSCOPIC PARTIAL COLECTOMY N/A 05/24/2018   Procedure: LAPAROSCOPIC ASSISTED ASCENDING HEMICOLECTOMY;  Surgeon: Ileana Roup, MD;  Location: [facility];  Service: General;  Laterality: N/A;   PACEMAKER IMPLANT N/A 03/02/2018  Procedure: PACEMAKER IMPLANT;  Surgeon: Evans Lance, MD;  Location: [facility];  Service: Cardiovascular;  Laterality: N/A;   RIGHT/LEFT HEART CATH AND CORONARY ANGIOGRAPHY N/A 02/21/2018   Procedure: RIGHT/LEFT HEART CATH AND CORONARY ANGIOGRAPHY;  Surgeon: Sherren Mocha, MD;  Location: [facility];  Service: Cardiovascular;  Laterality: N/A;   SUBMUCOSAL TATTOO INJECTION  05/19/2018   Procedure: SUBMUCOSAL TATTOO INJECTION;  Surgeon: Lavena Bullion, DO;  Location: [facility];  Service: Gastroenterology;;   TEE WITHOUT  CARDIOVERSION N/A 02/28/2018   Procedure: TRANSESOPHAGEAL ECHOCARDIOGRAM (TEE);  Surgeon: Sherren Mocha, MD;  Location: [facility];  Service: Open Heart Surgery;  Laterality: N/A;   TEMPORARY PACEMAKER N/A 03/01/2018   Procedure: TEMPORARY PACEMAKER;  Surgeon: Burnell Blanks, MD;  Location: [facility];  Service: Cardiovascular;  Laterality: N/A;   TONSILLECTOMY     TRANSCATHETER AORTIC VALVE REPLACEMENT, TRANSFEMORAL N/A 02/28/2018   Procedure: TRANSCATHETER AORTIC VALVE REPLACEMENT, TRANSFEMORAL;  Surgeon: Sherren Mocha, MD;  Location: [facility];  Service: Open Heart Surgery;  Laterality: N/A;     OB History   No obstetric history on file.      Home Medications    Prior to Admission medications   Medication Sig Start Date End Date Taking? Authorizing Provider  amoxicillin (AMOXIL) 500 MG tablet Please take 2,000 mg (4 tablets) one hour prior to all dental procedures. 03/16/18   Eileen Stanford, PA-C  apixaban (ELIQUIS) 5 MG TABS tablet Take 1 tablet (5 mg total) by mouth 2 (two) times daily. 09/20/18   Sueanne Margarita, MD  Calcium Carb-Cholecalciferol (CALTRATE 600+D3 PO) Take 1 tablet by mouth 2 (two) times daily.    [provider]  diphenhydrAMINE (BENADRYL) 25 MG tablet Take 25 mg by mouth every 6 (six) hours as needed.    [provider]  ferrous sulfate 325 (65 FE) MG tablet Take 1 tablet (325 mg total) by mouth every other day. 05/27/18   Shirley, [place], DO  furosemide (LASIX) 20 MG tablet Take 1 tablet (20 mg total) by mouth daily as needed. 08/02/18 08/02/19  Sueanne Margarita, MD  levothyroxine (SYNTHROID) 100 MCG tablet Take 100 mcg by mouth daily before breakfast.     [provider]  Melatonin 5 MG CAPS Take 5 mg by mouth at bedtime as needed (sleep).     [provider]  metoprolol tartrate (LOPRESSOR) 50 MG tablet Take 50 mg by mouth 2 (two) times daily.    [provider]  Multiple  Vitamins-Minerals (MULTIVITAMIN ADULTS 50+) TABS Take 1 tablet by mouth daily.    [provider]  pantoprazole (PROTONIX) 40 MG tablet Take 1 tablet (40 mg total) by mouth daily. 05/28/18   Shirley, [place], DO  saccharomyces boulardii (FLORASTOR) 250 MG capsule Take 250 mg by mouth 2 (two) times daily.    [provider]  simvastatin (ZOCOR) 40 MG tablet Take 1 tablet (40 mg total) by mouth at bedtime. 06/20/18 10/31/18  Sueanne Margarita, MD    Family History Family History  Problem Relation Age of Onset   Heart failure Mother    Chronic Renal Failure Mother    Alcohol abuse Father    Skin cancer Sister 35   Skin cancer Sister 81    Social History Social History   Tobacco Use   Smoking status: Former Smoker    Packs/day: 1.00    Years: 45.00    Pack years: 45.00  Types: Cigarettes    Quit date: 05/23/2009    Years since quitting: 9.4   Smokeless tobacco: Never Used  Substance Use Topics   Alcohol use: Not Currently   Drug use: Never     Allergies   Adhesive [tape]   Review of Systems Review of Systems  All other systems reviewed and are negative.    Physical Exam Updated Vital Signs BP (!) 132/56 (BP Location: Right Arm)    Pulse 62    Temp 97.8 F (36.6 C) (Oral)    Resp 16    Ht 5\' 2"  (1.575 m)    Wt 75.3 kg    SpO2 100%    BMI 30.36 kg/m   Physical Exam Vitals signs and nursing note reviewed.  Constitutional:      Appearance: She is well-developed.  HENT:     Head: Normocephalic.     Comments: 1.5 cm superficial laceration  Oozing     Right Ear: Tympanic membrane normal.     Left Ear: Tympanic membrane normal.     Nose: Nose normal.     Mouth/Throat:     Mouth: Mucous membranes are moist.  Eyes:     Pupils: Pupils are equal, round, and reactive to light.  Neck:     Musculoskeletal: Normal range of motion.  Cardiovascular:     Rate and Rhythm: Normal rate.  Pulmonary:     Effort: Pulmonary effort is normal.  Abdominal:       General: There is no distension.  Musculoskeletal: Normal range of motion.  Skin:    General: Skin is warm.  Neurological:     Mental Status: She is alert and oriented to person, place, and time.  Psychiatric:        Mood and Affect: Mood normal.      ED Treatments / Results  Labs (all labs ordered are listed, but only abnormal results are displayed) Labs Reviewed - No data to display  EKG None  Radiology Ct Angio Head W Or Wo Contrast  Result Date: 10/31/2018 CLINICAL DATA:  Dizziness and intermittent blurred vision. EXAM: CT ANGIOGRAPHY HEAD AND NECK TECHNIQUE: Multidetector CT imaging of the head and neck was performed using the standard protocol during bolus administration of intravenous contrast. Multiplanar CT image reconstructions and MIPs were obtained to evaluate the vascular anatomy. Carotid stenosis measurements (when applicable) are obtained utilizing NASCET criteria, using the distal internal carotid diameter as the denominator. CONTRAST:  47mL OMNIPAQUE IOHEXOL 350 MG/ML SOLN COMPARISON:  Head CT same day FINDINGS: CTA NECK FINDINGS Aortic arch: Aortic atherosclerosis. No aneurysm or dissection. Branching pattern is normal. 50% stenosis of the left subclavian artery origin. Innominate and left common carotid origin widely patent. Right carotid system: Common carotid artery widely patent to the bifurcation. Calcified plaque at the carotid bifurcation and ICA bulb. Minimal diameter in the ICA bulb measures 3.5 mm. Compared to a more distal cervical ICA diameter of 5 mm, this indicates a 30% stenosis. The distal ICA is tortuous but widely patent. Left carotid system: Common carotid artery is tortuous but widely patent to the bifurcation region. There is calcified plaque at the bifurcation and in the ICA bulb. Detail is limited secondary to artifact from jewelry that the patient refused to remove. At the distal ICA bulb, minimal diameter measures 1 mm. Compared to a more distal  cervical ICA diameter of 5 mm, this indicates an 80% stenosis. Vertebral arteries: Left vertebral artery shows calcified plaque at its origin with stenosis of  30%. Beyond that, the vessel is widely patent through the cervical region. The right vertebral artery is occluded at its origin. There is reconstituted flow in the upper cervical region. Skeleton: Degenerative spondylosis. Other neck: Lipoma in the lower intrinsic neck musculature on the right. Otherwise no soft tissue neck finding. Upper chest: No active upper chest process evident. Review of the MIP images confirms the above findings CTA HEAD FINDINGS Anterior circulation: Both internal carotid arteries are patent through the skull base and siphon regions. There is extensive atherosclerotic calcification in the carotid siphon regions and supraclinoid internal carotid arteries. Stenosis is estimated at 50% in the supraclinoid regions. The anterior and middle cerebral vessels are patent without proximal stenosis, aneurysm or vascular malformation. No large or medium vessel occlusion is identified. Posterior circulation: Left vertebral artery is patent through the foramen magnum to the basilar. No basilar stenosis. Superior cerebellar and posterior cerebral arteries are patent. There are patent bilateral posterior communicating arteries. As noted above, right vertebral artery is a tiny vessel with reconstituted flow that appears to give some contribution the basilar. Flow does appear to be present in right PICA presently. Venous sinuses: Patent and normal. Anatomic variants: None other significant. Review of the MIP images confirms the above findings IMPRESSION: Atherosclerotic disease at both carotid bifurcations. 30% stenosis of the proximal ICA on the right. 80% stenosis in the ICA bulb on the left. Atherosclerotic disease in both carotid siphon regions and supraclinoid internal carotid arteries. Maximal stenosis 50% on both sides. Beyond that, no  intracranial large or medium vessel occlusion or correctable proximal stenosis is seen. 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 30% stenosis of the left vertebral artery origin. Reconstituted flow in a tiny distal right vertebral artery at the skull base level gives minimal contribution to the basilar. No basilar stenosis or posterior circulation branch vessel occlusion. Electronically Signed   By: Nelson Chimes M.D.   On: 10/31/2018 17:25   Ct Head Wo Contrast  Result Date: 10/31/2018 CLINICAL DATA:  Dizziness EXAM: CT HEAD WITHOUT CONTRAST TECHNIQUE: Contiguous axial images were obtained from the base of the skull through the vertex without intravenous contrast. COMPARISON:  10/14/2017 FINDINGS: Brain: Large old bilateral cerebellar infarcts, stable. There is atrophy and chronic small vessel disease changes. No acute intracranial abnormality. Specifically, no hemorrhage, hydrocephalus, mass lesion, acute infarction, or significant intracranial injury. Vascular: No hyperdense vessel or unexpected calcification. Skull: No acute calvarial abnormality. Sinuses/Orbits: Visualized paranasal sinuses and mastoids clear. Orbital soft tissues unremarkable. Other: None IMPRESSION: Large old bilateral cerebellar infarcts and encephalomalacia. Atrophy, chronic microvascular disease. No acute intracranial abnormality. Electronically Signed   By: Rolm Baptise M.D.   On: 10/31/2018 11:20   Ct Angio Neck W And/or Wo Contrast  Result Date: 10/31/2018 CLINICAL DATA:  Dizziness and intermittent blurred vision. EXAM: CT ANGIOGRAPHY HEAD AND NECK TECHNIQUE: Multidetector CT imaging of the head and neck was performed using the standard protocol during bolus administration of intravenous contrast. Multiplanar CT image reconstructions and MIPs were obtained to evaluate the vascular anatomy. Carotid stenosis measurements (when applicable) are obtained utilizing NASCET criteria, using the distal internal carotid diameter as  the denominator. CONTRAST:  53mL OMNIPAQUE IOHEXOL 350 MG/ML SOLN COMPARISON:  Head CT same day FINDINGS: CTA NECK FINDINGS Aortic arch: Aortic atherosclerosis. No aneurysm or dissection. Branching pattern is normal. 50% stenosis of the left subclavian artery origin. Innominate and left common carotid origin widely patent. Right carotid system: Common carotid artery widely patent to the bifurcation. Calcified plaque  at the carotid bifurcation and ICA bulb. Minimal diameter in the ICA bulb measures 3.5 mm. Compared to a more distal cervical ICA diameter of 5 mm, this indicates a 30% stenosis. The distal ICA is tortuous but widely patent. Left carotid system: Common carotid artery is tortuous but widely patent to the bifurcation region. There is calcified plaque at the bifurcation and in the ICA bulb. Detail is limited secondary to artifact from jewelry that the patient refused to remove. At the distal ICA bulb, minimal diameter measures 1 mm. Compared to a more distal cervical ICA diameter of 5 mm, this indicates an 80% stenosis. Vertebral arteries: Left vertebral artery shows calcified plaque at its origin with stenosis of 30%. Beyond that, the vessel is widely patent through the cervical region. The right vertebral artery is occluded at its origin. There is reconstituted flow in the upper cervical region. Skeleton: Degenerative spondylosis. Other neck: Lipoma in the lower intrinsic neck musculature on the right. Otherwise no soft tissue neck finding. Upper chest: No active upper chest process evident. Review of the MIP images confirms the above findings CTA HEAD FINDINGS Anterior circulation: Both internal carotid arteries are patent through the skull base and siphon regions. There is extensive atherosclerotic calcification in the carotid siphon regions and supraclinoid internal carotid arteries. Stenosis is estimated at 50% in the supraclinoid regions. The anterior and middle cerebral vessels are patent without  proximal stenosis, aneurysm or vascular malformation. No large or medium vessel occlusion is identified. Posterior circulation: Left vertebral artery is patent through the foramen magnum to the basilar. No basilar stenosis. Superior cerebellar and posterior cerebral arteries are patent. There are patent bilateral posterior communicating arteries. As noted above, right vertebral artery is a tiny vessel with reconstituted flow that appears to give some contribution the basilar. Flow does appear to be present in right PICA presently. Venous sinuses: Patent and normal. Anatomic variants: None other significant. Review of the MIP images confirms the above findings IMPRESSION: Atherosclerotic disease at both carotid bifurcations. 30% stenosis of the proximal ICA on the right. 80% stenosis in the ICA bulb on the left. Atherosclerotic disease in both carotid siphon regions and supraclinoid internal carotid arteries. Maximal stenosis 50% on both sides. Beyond that, no intracranial large or medium vessel occlusion or correctable proximal stenosis is seen. 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 30% stenosis of the left vertebral artery origin. Reconstituted flow in a tiny distal right vertebral artery at the skull base level gives minimal contribution to the basilar. No basilar stenosis or posterior circulation branch vessel occlusion. Electronically Signed   By: Nelson Chimes M.D.   On: 10/31/2018 17:25   Dg Chest Port 1 View  Result Date: 10/31/2018 CLINICAL DATA:  Pt having weakness, dizziness for 2 weeks, syncope yesterday - hx of CHF, htn, CVA, TAVR, exsmoker, RBBBcough, weakness EXAM: PORTABLE CHEST 1 VIEW COMPARISON:  03/03/2018 FINDINGS: LEFT-sided pacemaker overlies stable cardiac silhouette. Lungs are mildly hyperinflated. No effusion, infiltrate pneumothorax. No acute osseous abnormality. IMPRESSION: No acute cardiopulmonary process. Electronically Signed   By: Suzy Bouchard M.D.   On: 10/31/2018  12:47    Procedures .Marland KitchenLaceration Repair  Date/Time: 11/01/2018 12:09 PM Performed by: Fransico Meadow, PA-C Authorized by: Fransico Meadow, PA-C   Consent:    Consent obtained:  Verbal   Consent given by:  Patient   Risks discussed:  Infection, need for additional repair, pain, poor cosmetic result and poor wound healing   Alternatives discussed:  No treatment and delayed  treatment Universal protocol:    Procedure explained and questions answered to patient or proxy's satisfaction: yes     Relevant documents present and verified: yes     Test results available and properly labeled: yes     Imaging studies available: yes     Required blood products, implants, devices, and special equipment available: yes     Site/side marked: yes     Immediately prior to procedure, a time out was called: yes     Patient identity confirmed:  Verbally with patient Laceration details:    Location:  Face   Face location:  Forehead   Length (cm):  1.5 Repair type:    Repair type:  Simple Exploration:    Wound exploration: entire depth of wound probed and visualized   Treatment:    Area cleansed with:  Shur-Clens   Irrigation solution:  Sterile saline Skin repair:    Repair method:  Tissue adhesive Approximation:    Approximation:  Close Post-procedure details:    Patient tolerance of procedure:  Tolerated well, no immediate complications   (including critical care time)  Medications Ordered in ED Medications - No data to display   Initial Impression / Assessment and Plan / ED Course  I have reviewed the triage vital signs and the nursing notes.  Pertinent labs & imaging results that were available during my care of the patient were reviewed by me and considered in my medical decision making (see chart for details).        MDM  Pt evaluated yesterday.  Pt had a slight increase in creatinine and today is orthostatic.  Pt given Iv fluids x 1 liter.  Ct head repeated due to impact of  head and syncope.   I will consult hospitalist for admission   Final Clinical Impressions(s) / ED Diagnoses   Final diagnoses:  Laceration of forehead, initial encounter  Syncope and collapse  Dizziness    ED Discharge Orders    None       Sidney Ace 11/01/18 1533    Davonna Belling, MD 11/01/18 1553

## 2018-11-01 NOTE — ED Triage Notes (Signed)
Pt had 2 syncopal episodes today, one with a witnessed fall. Small lac on L eyebrow. Pt thinks she had brief LOC. EMS said she was negative for orthostatic vitals. Pt takes Eliquis.

## 2018-11-02 ENCOUNTER — Encounter (HOSPITAL_COMMUNITY): Payer: Self-pay

## 2018-11-02 ENCOUNTER — Observation Stay (HOSPITAL_COMMUNITY): Payer: Medicare Other

## 2018-11-02 DIAGNOSIS — I951 Orthostatic hypotension: Secondary | ICD-10-CM | POA: Diagnosis present

## 2018-11-02 DIAGNOSIS — Z136 Encounter for screening for cardiovascular disorders: Secondary | ICD-10-CM | POA: Diagnosis not present

## 2018-11-02 DIAGNOSIS — E039 Hypothyroidism, unspecified: Secondary | ICD-10-CM | POA: Diagnosis present

## 2018-11-02 DIAGNOSIS — I5032 Chronic diastolic (congestive) heart failure: Secondary | ICD-10-CM | POA: Diagnosis not present

## 2018-11-02 DIAGNOSIS — R42 Dizziness and giddiness: Secondary | ICD-10-CM | POA: Diagnosis not present

## 2018-11-02 DIAGNOSIS — Z950 Presence of cardiac pacemaker: Secondary | ICD-10-CM | POA: Diagnosis not present

## 2018-11-02 DIAGNOSIS — I739 Peripheral vascular disease, unspecified: Secondary | ICD-10-CM | POA: Diagnosis present

## 2018-11-02 DIAGNOSIS — Z841 Family history of disorders of kidney and ureter: Secondary | ICD-10-CM | POA: Diagnosis not present

## 2018-11-02 DIAGNOSIS — Z7902 Long term (current) use of antithrombotics/antiplatelets: Secondary | ICD-10-CM | POA: Diagnosis not present

## 2018-11-02 DIAGNOSIS — Z87891 Personal history of nicotine dependence: Secondary | ICD-10-CM | POA: Diagnosis not present

## 2018-11-02 DIAGNOSIS — R55 Syncope and collapse: Secondary | ICD-10-CM | POA: Diagnosis not present

## 2018-11-02 DIAGNOSIS — I4510 Unspecified right bundle-branch block: Secondary | ICD-10-CM | POA: Diagnosis present

## 2018-11-02 DIAGNOSIS — E785 Hyperlipidemia, unspecified: Secondary | ICD-10-CM | POA: Diagnosis present

## 2018-11-02 DIAGNOSIS — Z79890 Hormone replacement therapy (postmenopausal): Secondary | ICD-10-CM | POA: Diagnosis not present

## 2018-11-02 DIAGNOSIS — I6522 Occlusion and stenosis of left carotid artery: Secondary | ICD-10-CM | POA: Diagnosis not present

## 2018-11-02 DIAGNOSIS — Z20828 Contact with and (suspected) exposure to other viral communicable diseases: Secondary | ICD-10-CM | POA: Diagnosis present

## 2018-11-02 DIAGNOSIS — Z8249 Family history of ischemic heart disease and other diseases of the circulatory system: Secondary | ICD-10-CM | POA: Diagnosis not present

## 2018-11-02 DIAGNOSIS — R9389 Abnormal findings on diagnostic imaging of other specified body structures: Secondary | ICD-10-CM | POA: Diagnosis not present

## 2018-11-02 DIAGNOSIS — Z808 Family history of malignant neoplasm of other organs or systems: Secondary | ICD-10-CM | POA: Diagnosis not present

## 2018-11-02 DIAGNOSIS — I10 Essential (primary) hypertension: Secondary | ICD-10-CM | POA: Diagnosis not present

## 2018-11-02 DIAGNOSIS — N1831 Chronic kidney disease, stage 3a: Secondary | ICD-10-CM | POA: Diagnosis present

## 2018-11-02 DIAGNOSIS — I130 Hypertensive heart and chronic kidney disease with heart failure and stage 1 through stage 4 chronic kidney disease, or unspecified chronic kidney disease: Secondary | ICD-10-CM | POA: Diagnosis present

## 2018-11-02 DIAGNOSIS — Z85828 Personal history of other malignant neoplasm of skin: Secondary | ICD-10-CM | POA: Diagnosis not present

## 2018-11-02 DIAGNOSIS — I442 Atrioventricular block, complete: Secondary | ICD-10-CM | POA: Diagnosis present

## 2018-11-02 DIAGNOSIS — Z7901 Long term (current) use of anticoagulants: Secondary | ICD-10-CM | POA: Diagnosis not present

## 2018-11-02 DIAGNOSIS — Z8673 Personal history of transient ischemic attack (TIA), and cerebral infarction without residual deficits: Secondary | ICD-10-CM | POA: Diagnosis not present

## 2018-11-02 DIAGNOSIS — I69351 Hemiplegia and hemiparesis following cerebral infarction affecting right dominant side: Secondary | ICD-10-CM | POA: Diagnosis not present

## 2018-11-02 DIAGNOSIS — M1990 Unspecified osteoarthritis, unspecified site: Secondary | ICD-10-CM | POA: Diagnosis present

## 2018-11-02 DIAGNOSIS — Z953 Presence of xenogenic heart valve: Secondary | ICD-10-CM | POA: Diagnosis not present

## 2018-11-02 DIAGNOSIS — Z811 Family history of alcohol abuse and dependence: Secondary | ICD-10-CM | POA: Diagnosis not present

## 2018-11-02 LAB — BASIC METABOLIC PANEL
Anion gap: 7 (ref 5–15)
BUN: 16 mg/dL (ref 8–23)
CO2: 21 mmol/L — ABNORMAL LOW (ref 22–32)
Calcium: 8.5 mg/dL — ABNORMAL LOW (ref 8.9–10.3)
Chloride: 112 mmol/L — ABNORMAL HIGH (ref 98–111)
Creatinine, Ser: 0.95 mg/dL (ref 0.44–1.00)
GFR calc Af Amer: >60 mL/min (ref >60)
GFR calc non Af Amer: 55 mL/min — ABNORMAL LOW (ref >60)
Glucose, Bld: 95 mg/dL (ref 70–99)
Potassium: 3.3 mmol/L — ABNORMAL LOW (ref 3.5–5.1)
Sodium: 140 mmol/L (ref 135–145)

## 2018-11-02 LAB — CBC
HCT: 37 % (ref 36.0–46.0)
Hemoglobin: 11.5 g/dL — ABNORMAL LOW (ref 12.0–15.0)
MCH: 28.5 pg (ref 26.0–34.0)
MCHC: 31.1 g/dL (ref 30.0–36.0)
MCV: 91.6 fL (ref 80.0–100.0)
Platelets: 233 10*3/uL (ref 150–400)
RBC: 4.04 MIL/uL (ref 3.87–5.11)
RDW: 18 % — ABNORMAL HIGH (ref 11.5–15.5)
WBC: 8 10*3/uL (ref 4.0–10.5)
nRBC: 0 % (ref 0.0–0.2)

## 2018-11-02 MED ORDER — POTASSIUM CHLORIDE CRYS ER 20 MEQ PO TBCR
40.0000 meq | EXTENDED_RELEASE_TABLET | Freq: Once | ORAL | Status: AC
  Administered 2018-11-02: 40 meq via ORAL
  Filled 2018-11-02: qty 2

## 2018-11-02 MED ORDER — METOPROLOL TARTRATE 25 MG PO TABS
25.0000 mg | ORAL_TABLET | Freq: Two times a day (BID) | ORAL | Status: DC
  Administered 2018-11-02: 25 mg via ORAL
  Filled 2018-11-02: qty 1

## 2018-11-02 MED ORDER — ASPIRIN EC 81 MG PO TBEC
81.0000 mg | DELAYED_RELEASE_TABLET | Freq: Every day | ORAL | Status: DC
  Administered 2018-11-02 – 2018-11-03 (×2): 81 mg via ORAL
  Filled 2018-11-02 (×2): qty 1

## 2018-11-02 NOTE — Progress Notes (Signed)
Carotid artery duplex has been completed. Preliminary results can be found in CV Proc through chart review.   11/02/18 3:07 PM Carlos Levering RVT

## 2018-11-02 NOTE — Progress Notes (Addendum)
Progress Note    Julie Mcgee  [id number] DOB: [birth year]  DOA: 11/01/2018 PCP: Lyman Bishop, DO    Brief Narrative:     Medical records reviewed and are as summarized below:  Julie Mcgee is an 85 y.o. female with medical history significant of severe aortic stenosis status post TAVR on 02/2018-on aspirin and Plavix, hypertension, hyperlipidemia, hypothyroidism, peripheral vascular disease, carotid artery stenosis, chronic diastolic congestive heart failure with preserved ejection fraction of 60 to 65%, previous CVA, complete heart block status post pacemaker placement, invasive adenocarcinoma of colon status post hemicolectomy on 05/24/2018 presents to emergency department for syncope.  Patient was seen in the ED yesterday with dizziness.  MRI was not able to perform due to pacemaker.  CTA of head and neck was obtained which showed bilateral stenosis of ICA.  Possibility 80% of left ICA.  EDP talk to the neurology and schedule patient for outpatient follow-up.  Assessment/Plan:   Principal Problem:   Syncope Active Problems:   Hypertension   Hyperlipidemia   Hypothyroidism   Severe aortic stenosis   History of stroke   S/P TAVR (transcatheter aortic valve replacement)   Complete heart block (HCC)   Malignant neoplasm of ascending colon (HCC)   Chronic diastolic CHF (congestive heart failure) (HCC)   Rash   Syncope: -Likely secondary to orthostatic hypotension secondary to dehydration. -Patient orthostatic vitals positive in the ED.  CT head without contrast came back negative.  Reviewed CTA of head and neck from yesterday-- ? 80% stenosis on left-- ? Contributing-- odd presentation with syncope and then visual changes and dizziness the day prior-- will ask vascular to weigh in -PT home health  Laceration over left eye: -S/p fall.  Repaired in the ED. -Tylenol as needed for pain.  Hypertension: -Patient orthostatic vitals positive in ED. -resume lower dose  of metoprolol  Hyperlipidemia: -Continue statin.  Chronic diastolic congestive heart failure: Not in acute exacerbation. - With preserved ejection fraction of 60 to 65% -No signs of fluid overload. -She takes Lasix 20 mg as needed for leg edema. -Continue aspirin and statin  Complete heart block: Status post pacemaker  Severe aortic stenosis status post TAVR: Aware -add ASA and continue Eliquis.  GERD: Stable  -continue Protonix  Hypothyroidism:  -Continue levothyroxine  CKD stage III: At baseline -Monitor BMP.  Avoid nephrotoxic medication.  History of adenocarcinoma of colon: Status post hemicolectomy on 05/2018. -H&H is stable.   obesity Body mass index is 30.36 kg/m.  Will change to inpatient as patient still orthostatic despite volume replacement and is exceeding > 2 midnights  Family Communication/Anticipated D/C date and plan/Code Status   DVT prophylaxis: eliquis Code Status: Full Code.  Family Communication: son at bedside Disposition Plan: home in AM if not symptomatic with orthostatics   Medical Consultants:    vascular     Subjective:   Had some dizziness this AM when getting up with PT  Objective:    Vitals:   11/01/18 1740 11/01/18 1815 11/02/18 0004 11/02/18 0536  BP: (!) 113/41 (!) 132/50 (!) 123/44 (!) 149/51  Pulse: 64 (!) 59 61 61  Resp: 14 18 16 16   Temp:  97.7 F (36.5 C) 98.1 F (36.7 C) 98.2 F (36.8 C)  TempSrc:  Oral Oral Oral  SpO2: 98% 100% 98% 99%  Weight:      Height:        Intake/Output Summary (Last 24 hours) at 11/02/2018 1408 Last data filed at 11/01/2018 1800 Gross per  24 hour  Intake 143.33 ml  Output --  Net 143.33 ml   Filed Weights   11/01/18 1010  Weight: 75.3 kg    Exam: In chair, speech slurred but at baseline rrr No increased work of breathing Mildly hard of hearing   Data Reviewed:   I have personally reviewed following labs and imaging studies:  Labs: Labs show the  following:   Basic Metabolic Panel: Recent Labs  Lab 10/31/18 1106 11/01/18 1328 11/01/18 1822 11/02/18 0435  NA 139 140  --  140  K 4.0 4.2  --  3.3*  CL 105 106  --  112*  CO2 24 23  --  21*  GLUCOSE 75 87  --  95  BUN 18 21  --  16  CREATININE 1.09* 1.05*  --  0.95  CALCIUM 9.7 9.3  --  8.5*  MG  --   --  1.9  --    GFR Estimated Creatinine Clearance: 42.6 mL/min (by C-G formula based on SCr of 0.95 mg/dL). Liver Function Tests: Recent Labs  Lab 10/31/18 1106 11/01/18 1328  AST 21 26  ALT 12 13  ALKPHOS 63 57  BILITOT 0.7 0.8  PROT 7.3 6.4*  ALBUMIN 3.8 3.4*   No results for input(s): LIPASE, AMYLASE in the last 168 hours. No results for input(s): AMMONIA in the last 168 hours. Coagulation profile Recent Labs  Lab 10/31/18 1106  INR 1.4*    CBC: Recent Labs  Lab 10/31/18 1106 11/01/18 1328 11/02/18 0435  WBC 9.2 9.5 8.0  NEUTROABS 6.8 8.0*  --   HGB 12.6 13.2 11.5*  HCT 40.3 42.9 37.0  MCV 90.8 92.3 91.6  PLT 267 282 233   Cardiac Enzymes: No results for input(s): CKTOTAL, CKMB, CKMBINDEX, TROPONINI in the last 168 hours. BNP (last 3 results) No results for input(s): PROBNP in the last 8760 hours. CBG: Recent Labs  Lab 10/31/18 1144  GLUCAP 83   D-Dimer: No results for input(s): DDIMER in the last 72 hours. Hgb A1c: No results for input(s): HGBA1C in the last 72 hours. Lipid Profile: No results for input(s): CHOL, HDL, LDLCALC, TRIG, CHOLHDL, LDLDIRECT in the last 72 hours. Thyroid function studies: Recent Labs    11/01/18 1822  TSH 3.742   Anemia work up: No results for input(s): VITAMINB12, FOLATE, FERRITIN, TIBC, IRON, RETICCTPCT in the last 72 hours. Sepsis Labs: Recent Labs  Lab 10/31/18 1106 11/01/18 1328 11/01/18 1555 11/02/18 0435  WBC 9.2 9.5  --  8.0  LATICACIDVEN  --   --  0.7  --     Microbiology Recent Results (from the past 240 hour(s))  SARS CORONAVIRUS 2 (TAT 6-24 HRS) Nasopharyngeal Nasopharyngeal Swab      Status: None   Collection Time: 11/01/18  1:46 PM   Specimen: Nasopharyngeal Swab  Result Value Ref Range Status   SARS Coronavirus 2 NEGATIVE NEGATIVE Final    Comment: (NOTE) SARS-CoV-2 target nucleic acids are NOT DETECTED. The SARS-CoV-2 RNA is generally detectable in upper and lower respiratory specimens during the acute phase of infection. Negative results do not preclude SARS-CoV-2 infection, do not rule out co-infections with other pathogens, and should not be used as the sole basis for treatment or other patient management decisions. Negative results must be combined with clinical observations, patient history, and epidemiological information. The expected result is Negative. Fact Sheet for Patients: SugarRoll.be Fact Sheet for Healthcare Providers: https://www.woods-mathews.com/ This test is not yet approved or cleared by the [place]  FDA and  has been authorized for detection and/or diagnosis of SARS-CoV-2 by FDA under an Emergency Use Authorization (EUA). This EUA will remain  in effect (meaning this test can be used) for the duration of the COVID-19 declaration under Section 56 4(b)(1) of the Act, 21 U.S.C. section 360bbb-3(b)(1), unless the authorization is terminated or revoked sooner. Performed at [facility], [place] [address], [place], [place] [address]     Procedures and diagnostic studies:  Ct Angio Head W Or Wo Contrast  Result Date: 10/31/2018 CLINICAL DATA:  Dizziness and intermittent blurred vision. EXAM: CT ANGIOGRAPHY HEAD AND NECK TECHNIQUE: Multidetector CT imaging of the head and neck was performed using the standard protocol during bolus administration of intravenous contrast. Multiplanar CT image reconstructions and MIPs were obtained to evaluate the vascular anatomy. Carotid stenosis measurements (when applicable) are obtained utilizing NASCET criteria, using the distal internal carotid diameter as  the denominator. CONTRAST:  44mL OMNIPAQUE IOHEXOL 350 MG/ML SOLN COMPARISON:  Head CT same day FINDINGS: CTA NECK FINDINGS Aortic arch: Aortic atherosclerosis. No aneurysm or dissection. Branching pattern is normal. 50% stenosis of the left subclavian artery origin. Innominate and left common carotid origin widely patent. Right carotid system: Common carotid artery widely patent to the bifurcation. Calcified plaque at the carotid bifurcation and ICA bulb. Minimal diameter in the ICA bulb measures 3.5 mm. Compared to a more distal cervical ICA diameter of 5 mm, this indicates a 30% stenosis. The distal ICA is tortuous but widely patent. Left carotid system: Common carotid artery is tortuous but widely patent to the bifurcation region. There is calcified plaque at the bifurcation and in the ICA bulb. Detail is limited secondary to artifact from jewelry that the patient refused to remove. At the distal ICA bulb, minimal diameter measures 1 mm. Compared to a more distal cervical ICA diameter of 5 mm, this indicates an 80% stenosis. Vertebral arteries: Left vertebral artery shows calcified plaque at its origin with stenosis of 30%. Beyond that, the vessel is widely patent through the cervical region. The right vertebral artery is occluded at its origin. There is reconstituted flow in the upper cervical region. Skeleton: Degenerative spondylosis. Other neck: Lipoma in the lower intrinsic neck musculature on the right. Otherwise no soft tissue neck finding. Upper chest: No active upper chest process evident. Review of the MIP images confirms the above findings CTA HEAD FINDINGS Anterior circulation: Both internal carotid arteries are patent through the skull base and siphon regions. There is extensive atherosclerotic calcification in the carotid siphon regions and supraclinoid internal carotid arteries. Stenosis is estimated at 50% in the supraclinoid regions. The anterior and middle cerebral vessels are patent without  proximal stenosis, aneurysm or vascular malformation. No large or medium vessel occlusion is identified. Posterior circulation: Left vertebral artery is patent through the foramen magnum to the basilar. No basilar stenosis. Superior cerebellar and posterior cerebral arteries are patent. There are patent bilateral posterior communicating arteries. As noted above, right vertebral artery is a tiny vessel with reconstituted flow that appears to give some contribution the basilar. Flow does appear to be present in right PICA presently. Venous sinuses: Patent and normal. Anatomic variants: None other significant. Review of the MIP images confirms the above findings IMPRESSION: Atherosclerotic disease at both carotid bifurcations. 30% stenosis of the proximal ICA on the right. 80% stenosis in the ICA bulb on the left. Atherosclerotic disease in both carotid siphon regions and supraclinoid internal carotid arteries. Maximal stenosis 50% on both sides. Beyond  that, no intracranial large or medium vessel occlusion or correctable proximal stenosis is seen. 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 30% stenosis of the left vertebral artery origin. Reconstituted flow in a tiny distal right vertebral artery at the skull base level gives minimal contribution to the basilar. No basilar stenosis or posterior circulation branch vessel occlusion. Electronically Signed   By: Nelson Chimes M.D.   On: 10/31/2018 17:25   Ct Head Wo Contrast  Result Date: 11/01/2018 CLINICAL DATA:  85 year old female with head trauma and ataxia. EXAM: CT HEAD WITHOUT CONTRAST TECHNIQUE: Contiguous axial images were obtained from the base of the skull through the vertex without intravenous contrast. COMPARISON:  Head CT dated 10/31/2018 FINDINGS: Brain: Mild age-related atrophy and moderate chronic microvascular ischemic changes. Large areas of old infarct and encephalomalacia noted in the cerebellar hemispheres. There is no acute intracranial  hemorrhage. No mass effect or midline shift. No extra-axial fluid collection. Vascular: No hyperdense vessel or unexpected calcification. Skull: Normal. Negative for fracture or focal lesion. Sinuses/Orbits: No acute finding. Other: None IMPRESSION: 1. No acute intracranial hemorrhage. 2. Age-related atrophy and chronic microvascular ischemic changes. Large areas of old infarct and encephalomalacia in the cerebellar hemispheres. Electronically Signed   By: Anner Crete M.D.   On: 11/01/2018 12:40   Ct Angio Neck W And/or Wo Contrast  Result Date: 10/31/2018 CLINICAL DATA:  Dizziness and intermittent blurred vision. EXAM: CT ANGIOGRAPHY HEAD AND NECK TECHNIQUE: Multidetector CT imaging of the head and neck was performed using the standard protocol during bolus administration of intravenous contrast. Multiplanar CT image reconstructions and MIPs were obtained to evaluate the vascular anatomy. Carotid stenosis measurements (when applicable) are obtained utilizing NASCET criteria, using the distal internal carotid diameter as the denominator. CONTRAST:  49mL OMNIPAQUE IOHEXOL 350 MG/ML SOLN COMPARISON:  Head CT same day FINDINGS: CTA NECK FINDINGS Aortic arch: Aortic atherosclerosis. No aneurysm or dissection. Branching pattern is normal. 50% stenosis of the left subclavian artery origin. Innominate and left common carotid origin widely patent. Right carotid system: Common carotid artery widely patent to the bifurcation. Calcified plaque at the carotid bifurcation and ICA bulb. Minimal diameter in the ICA bulb measures 3.5 mm. Compared to a more distal cervical ICA diameter of 5 mm, this indicates a 30% stenosis. The distal ICA is tortuous but widely patent. Left carotid system: Common carotid artery is tortuous but widely patent to the bifurcation region. There is calcified plaque at the bifurcation and in the ICA bulb. Detail is limited secondary to artifact from jewelry that the patient refused to remove. At  the distal ICA bulb, minimal diameter measures 1 mm. Compared to a more distal cervical ICA diameter of 5 mm, this indicates an 80% stenosis. Vertebral arteries: Left vertebral artery shows calcified plaque at its origin with stenosis of 30%. Beyond that, the vessel is widely patent through the cervical region. The right vertebral artery is occluded at its origin. There is reconstituted flow in the upper cervical region. Skeleton: Degenerative spondylosis. Other neck: Lipoma in the lower intrinsic neck musculature on the right. Otherwise no soft tissue neck finding. Upper chest: No active upper chest process evident. Review of the MIP images confirms the above findings CTA HEAD FINDINGS Anterior circulation: Both internal carotid arteries are patent through the skull base and siphon regions. There is extensive atherosclerotic calcification in the carotid siphon regions and supraclinoid internal carotid arteries. Stenosis is estimated at 50% in the supraclinoid regions. The anterior and middle cerebral vessels are patent  without proximal stenosis, aneurysm or vascular malformation. No large or medium vessel occlusion is identified. Posterior circulation: Left vertebral artery is patent through the foramen magnum to the basilar. No basilar stenosis. Superior cerebellar and posterior cerebral arteries are patent. There are patent bilateral posterior communicating arteries. As noted above, right vertebral artery is a tiny vessel with reconstituted flow that appears to give some contribution the basilar. Flow does appear to be present in right PICA presently. Venous sinuses: Patent and normal. Anatomic variants: None other significant. Review of the MIP images confirms the above findings IMPRESSION: Atherosclerotic disease at both carotid bifurcations. 30% stenosis of the proximal ICA on the right. 80% stenosis in the ICA bulb on the left. Atherosclerotic disease in both carotid siphon regions and supraclinoid internal  carotid arteries. Maximal stenosis 50% on both sides. Beyond that, no intracranial large or medium vessel occlusion or correctable proximal stenosis is seen. 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 30% stenosis of the left vertebral artery origin. Reconstituted flow in a tiny distal right vertebral artery at the skull base level gives minimal contribution to the basilar. No basilar stenosis or posterior circulation branch vessel occlusion. Electronically Signed   By: Nelson Chimes M.D.   On: 10/31/2018 17:25    Medications:    apixaban  5 mg Oral BID   aspirin EC  81 mg Oral Daily   ferrous sulfate  325 mg Oral QODAY   levothyroxine  100 mcg Oral QAC breakfast   loratadine  10 mg Oral Daily   pantoprazole  40 mg Oral Daily   potassium chloride  40 mEq Oral Once   simvastatin  40 mg Oral QHS   Continuous Infusions:  sodium chloride 100 mL/hr at 11/01/18 1634     LOS: 0 days   Geradine Girt  [facility]   How to contact the [facility] Attending or Consulting provider [place] or covering provider during after hours [place], for this patient?  1. Check the care team in [facility] and look for a) attending/consulting [facility] provider listed and b) the [facility] team listed 2. Log into [email or web address] and use [place]'s universal password to access. If you do not have the password, please contact the hospital operator. 3. Locate the [facility] provider you are looking for under [facility] and page to a number that you can be directly reached. 4. If you still have difficulty reaching the provider, please page the [facility] (Director on Call) for the Hospitalists listed on amion for assistance.  11/02/2018, 2:08 PM

## 2018-11-02 NOTE — Discharge Instructions (Addendum)
Laceration Care, Adult A laceration is a cut that may go through all layers of the skin and into the tissue that is right under the skin. Some lacerations heal on their own. Others need to be closed with stitches (sutures), staples, skin adhesive strips, or skin glue. Proper care of a laceration reduces the risk for infection, helps the laceration heal better, and may prevent scarring. How to care for your laceration Wash your hands with soap and water before touching your wound or changing your bandage (dressing). If soap and water are not available, use hand sanitizer. Keep the wound clean and dry. If you were given a dressing, you should change it at least once a day, or as told by your health care provider. You should also change it if it becomes wet or dirty. If sutures or staples were used:  Keep the wound completely dry for the first 24 hours, or as told by your health care provider. After that time, you may shower or bathe. However, make sure that the wound is not soaked in water until after the sutures or staples have been removed.  Clean the wound once each day, or as told by your health care provider: ? Wash the wound with soap and water. ? Rinse the wound with water to remove all soap. ? Pat the wound dry with a clean towel. Do not rub the wound.  After cleaning the wound, apply a thin layer of antibiotic ointment as told by your health care provider. This will help prevent infection and keep the dressing from sticking to the wound.  Have the sutures or staples removed as told by your health care provider. If skin adhesive strips were used:  Do not get the skin adhesive strips wet. You may shower or bathe, but be careful to keep the wound dry.  If the wound gets wet, pat it dry with a clean towel. Do not rub the wound.  Skin adhesive strips fall off on their own. You may trim the strips as the wound heals. Do not remove skin adhesive strips that are still stuck to the wound.  They will fall off in time. If skin glue was used:  Try to keep the wound dry, but you may briefly wet it in the shower or bath. Do not soak the wound in water, such as by swimming.  After you have showered or bathed, gently pat the wound dry with a clean towel. Do not rub the wound.  Do not do any activities that will make you sweat heavily until the skin glue has fallen off on its own.  Do not apply liquid, cream, or ointment medicine to the wound while the skin glue is in place. Using those may loosen the film before the wound has healed.  If a dressing is placed over the wound, be careful not to apply tape directly over the skin glue. Doing that may cause the glue to be pulled off before the wound has healed.  Do not pick at the glue. Skin glue usually remains in place for 5-10 days and then falls off the skin. General instructions   Take over-the-counter and prescription medicines only as told by your health care provider.  If you were prescribed an antibiotic medicine or ointment, take or apply it as told by your health care provider. Do not stop using it even if your condition improves.  Do not scratch or pick at the wound.  Check your wound every day for signs  of infection. Watch for: ? Redness, swelling, or pain. ? Fluid, blood, or pus.  Raise (elevate) the injured area above the level of your heart while you are sitting or lying down for the first 24-48 hours after the laceration is repaired.  If directed, put ice on the affected area: ? Put ice in a plastic bag. ? Place a towel between your skin and the bag. ? Leave the ice on for 20 minutes, 2-3 times a day.  Keep all follow-up visits as told by your health care provider. This is important. Contact a health care provider if:  You received a tetanus shot and you have swelling, severe pain, redness, or bleeding at the injection site.  You have a fever.  A wound that was closed breaks open.  You notice a bad smell  coming from your wound or your dressing.  You notice something coming out of the wound, such as wood or glass.  Your pain is not controlled with medicine.  You have increased redness, swelling, or pain at the site of your wound.  You have fluid, blood, or pus coming from your wound.  You need to change the dressing often due to fluid, blood, or pus that is draining from the wound.  You develop a new rash.  You develop numbness around the wound. Get help right away if:  You develop severe swelling around the wound.  Your pain suddenly increases and is severe.  You develop painful lumps near the wound or on skin anywhere else on your body.  You have a red streak going away from your wound.  The wound is on your hand or foot and you cannot properly move a finger or toe.  The wound is on your hand or foot, and you notice that your fingers or toes look pale or bluish. Summary  A laceration is a cut that may go through all layers of the skin and into the tissue that is right under the skin.  Some lacerations heal on their own. Others need to be closed with stitches (sutures), staples, skin adhesive strips, or skin glue.  Proper care of a laceration reduces the risk of infection, helps the laceration heal better, and prevents scarring. This information is not intended to replace advice given to you by your health care provider. Make sure you discuss any questions you have with your health care provider. Document Released: 12/28/2004 Document Revised: 02/25/2017 Document Reviewed: 01/17/2017 Elsevier Patient Education  2020 [place] on my medicine - ELIQUIS (apixaban)  This medication education was reviewed with me or my healthcare representative as part of my discharge preparation.   Why was Eliquis prescribed for you? Eliquis was prescribed for you to reduce the risk of forming blood clots that can cause a stroke if you have a medical condition called atrial  fibrillation (a type of irregular heartbeat) OR to reduce the risk of a blood clots forming after orthopedic surgery.  What do You need to know about Eliquis ? Take your Eliquis TWICE DAILY - one tablet in the morning and one tablet in the evening with or without food.  It would be best to take the doses about the same time each day.  If you have difficulty swallowing the tablet whole please discuss with your pharmacist how to take the medication safely.  Take Eliquis exactly as prescribed by your doctor and DO NOT stop taking Eliquis without talking to the doctor who prescribed the medication.  Stopping may increase  your risk of developing a new clot or stroke.  Refill your prescription before you run out.  After discharge, you should have regular check-up appointments with your healthcare provider that is prescribing your Eliquis.  In the future your dose may need to be changed if your kidney function or weight changes by a significant amount or as you get older.  What do you do if you miss a dose? If you miss a dose, take it as soon as you remember on the same day and resume taking twice daily.  Do not take more than one dose of ELIQUIS at the same time.  Important Safety Information A possible side effect of Eliquis is bleeding. You should call your healthcare provider right away if you experience any of the following: ? Bleeding from an injury or your nose that does not stop. ? Unusual colored urine (red or dark brown) or unusual colored stools (red or black). ? Unusual bruising for unknown reasons. ? A serious fall or if you hit your head (even if there is no bleeding).  Some medicines may interact with Eliquis and might increase your risk of bleeding or clotting while on Eliquis. To help avoid this, consult your healthcare provider or pharmacist prior to using any new prescription or non-prescription medications, including herbals, vitamins, non-steroidal anti-inflammatory drugs  (NSAIDs) and supplements.  This website has more information on Eliquis (apixaban): [email or web address].

## 2018-11-02 NOTE — TOC Initial Note (Signed)
Transition of Care [facility]) - Initial/Assessment Note    Patient Details  Name: Julie Mcgee MRN: [id number] Date of Birth: April 28, 1933  Transition of [facility] CM/SW Contact:    Marilu Favre, RN Phone Number: 11/02/2018, 2:19 PM  Clinical Narrative:                  Patient from home with husband. Has walker and bedside commode at home already .  Cassie with [facility] has accepted referral and will call patient. Expected Discharge Plan: [place] Barriers to Discharge: Continued Medical Work up   Patient Goals and CMS Choice Patient states their goals for this hospitalization and ongoing recovery are:: to go home CMS Medicare.gov Compare Post Acute Care list provided to:: Patient Choice offered to / list presented to : Patient  Expected Discharge Plan and Services Expected Discharge Plan: [place]   Discharge Planning Services: CM Consult Post Acute Care Choice: [place] arrangements for the past 2 months: Single Family Home                 DME Arranged: N/A         HH Arranged: PT HH Agency: [facility] Date [place]: 11/02/18 Time [place]: 61 Representative spoke with at [place]: [place] Arrangements/Services Living arrangements for the past 2 months: [place] with:: Spouse Patient language and need for interpreter reviewed:: Yes        Need for Family Participation in Patient Care: Yes (Comment) Care giver support system in place?: Yes (comment) Current home services: DME Criminal Activity/Legal Involvement Pertinent to Current Situation/Hospitalization: No - Comment as needed  Activities of Daily Living Home Assistive Devices/Equipment: Bedside commode/3-in-1, Raised toilet seat with rails, Walker (specify type) ADL Screening (condition at time of admission) Patient's cognitive ability adequate to safely complete daily activities?: Yes Is  the patient deaf or have difficulty hearing?: No Does the patient have difficulty seeing, even when wearing glasses/contacts?: No Does the patient have difficulty concentrating, remembering, or making decisions?: Yes Patient able to express need for assistance with ADLs?: Yes Does the patient have difficulty dressing or bathing?: No Independently performs ADLs?: No Does the patient have difficulty walking or climbing stairs?: Yes Weakness of Legs: Both Weakness of Arms/Hands: Both  Permission Sought/Granted   Permission granted to share information with : Yes, Verbal Permission Granted     Permission granted to share info w AGENCY: [facility]        Emotional Assessment Appearance:: Appears stated age Attitude/Demeanor/Rapport: Engaged Affect (typically observed): Accepting Orientation: : Oriented to Self, Oriented to Place, Oriented to  Time, Oriented to Situation Alcohol / Substance Use: Not Applicable Psych Involvement: No (comment)  Admission diagnosis:  Syncope and collapse [R55] Dizziness [R42] Laceration of forehead, initial encounter [S01.81XA] Patient Active Problem List   Diagnosis Date Noted  . Syncope 11/01/2018  . Rash 11/01/2018  . Family history of skin cancer   . Chronic diastolic CHF (congestive heart failure) ([place]) 06/19/2018  . Breast lesion 06/07/2018  . Malignant neoplasm of ascending colon ([place])   . Colonic mass   . Hiatal hernia   . Gastrointestinal hemorrhage   . Anticoagulated   . Acute upper GI bleed 05/17/2018  . Symptomatic anemia   . Complete heart block ([place]) 03/02/2018  . Sinus bradycardia   . Cardiac arrest ([place])   . 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  RBBB   . Severe aortic stenosis 05/31/2017  . History of stroke 05/31/2017  . Unstable gait 05/31/2017  . Recurrent falls 05/31/2017  . Hypertension 05/13/2017  . Hyperlipidemia 05/13/2017  . Hypothyroidism 05/13/2017  . Arthritis 05/13/2017   PCP:   Lyman Bishop, DO Pharmacy:   [facility] - [place], [address] [place] [address] Phone: [phone] Fax: [phone]     Social Determinants of Health (SDOH) Interventions    Readmission Risk Interventions Readmission Risk Prevention Plan 05/26/2018  Transportation Screening Complete  PCP or Specialist Appt within 3-5 Days Not Complete  Not Complete comments Follow up appt 06/13/18 per MD arrangements  [place] or [place] Complete  Social Work Consult for [place] Planning/Counseling Patient refused  Palliative Care Screening Not Applicable  Medication Review (RN Care Manager) Complete  Some recent data might be hidden

## 2018-11-02 NOTE — Evaluation (Signed)
Physical Therapy Evaluation Patient Details Name: Julie Mcgee MRN: [id number] DOB: 07-27-1933 Today's Date: 11/02/2018   History of Present Illness  Pt is an 85 y/o female admitted from home after having a syncopal episode in which she fell and hit her head. CT of her head was negative for any acute findings. MRI unable to be obtained secondary to pacemaker. Pt found to be dehydrated. PMH including but not limited to CHF, HTN, HLD, PVD.    Clinical Impression  Pt presented supine in bed with HOB elevated, awake and willing to participate in therapy session. Prior to admission, pt reported that she ambulated with use of a RW and was independent with ADLs. Pt lives with her husband in a "retirement community". She is originally from [place] state and moved to [place] approximately one year ago. At the time of evaluation, pt greatly limited secondary to positive orthostatics (see below). BP decreased to as low as 78/45 mmHg in standing; therefore, deferred further mobility. RN notified and aware. Pt would continue to benefit from skilled physical therapy services at this time while admitted and after d/c to address the below listed limitations in order to improve overall safety and independence with functional mobility.  BP in supine = 130/62 BP in sitting = 138/60 BP in standing at 1 min = 93/36 BP in standing at 3 mins = 78/45 BP in supine at end of session = 108/49     Follow Up Recommendations Home health PT;Supervision/Assistance - 24 hour    Equipment Recommendations  None recommended by PT    Recommendations for Other Services       Precautions / Restrictions Precautions Precautions: Fall Precaution Comments: + orthostatics Restrictions Weight Bearing Restrictions: No      Mobility  Bed Mobility Overal bed mobility: Needs Assistance Bed Mobility: Supine to Sit;Sit to Supine     Supine to sit: Supervision Sit to supine: Supervision   General bed mobility comments: HOB  elevated, no physical assistance needed  Transfers Overall transfer level: Needs assistance Equipment used: Rolling walker (2 wheeled) Transfers: Sit to/from Stand Sit to Stand: Supervision         General transfer comment: good technique utilized, no physical assistance needed  Ambulation/Gait             General Gait Details: deferred secondary to + orthostatics (lowest BP was 78/45 in standing)  Stairs            Wheelchair Mobility    Modified Rankin (Stroke Patients Only)       Balance Overall balance assessment: Needs assistance Sitting-balance support: Feet supported Sitting balance-Leahy Scale: Good     Standing balance support: Single extremity supported Standing balance-Leahy Scale: Poor                               Pertinent Vitals/Pain Pain Assessment: No/denies pain    Home Living Family/patient expects to be discharged to:: Private residence Living Arrangements: Spouse/significant other Available Help at Discharge: Family;Available 24 hours/day Type of Home: Independent living facility Home Access: Level entry     Home Layout: One level Home Equipment: Walker - 2 wheels;Walker - 4 wheels;Cane - single point;Bedside commode;Grab bars - tub/shower;Shower seat      Prior Function Level of Independence: Independent with assistive device(s)         Comments: pt ambulates with a RW     Hand Dominance  Extremity/Trunk Assessment   Upper Extremity Assessment Upper Extremity Assessment: Overall WFL for tasks assessed    Lower Extremity Assessment Lower Extremity Assessment: Overall WFL for tasks assessed       Communication   Communication: No difficulties  Cognition Arousal/Alertness: Awake/alert Behavior During Therapy: WFL for tasks assessed/performed Overall Cognitive Status: Impaired/Different from baseline Area of Impairment: Memory;Safety/judgement;Problem solving                      Memory: Decreased short-term memory   Safety/Judgement: Decreased awareness of deficits;Decreased awareness of safety   Problem Solving: Requires verbal cues        General Comments      Exercises     Assessment/Plan    PT Assessment Patient needs continued PT services  PT Problem List Decreased balance;Decreased mobility;Decreased coordination;Decreased knowledge of precautions;Cardiopulmonary status limiting activity       PT Treatment Interventions DME instruction;Gait training;Stair training;Functional mobility training;Therapeutic activities;Therapeutic exercise;Balance training;Neuromuscular re-education;Patient/family education    PT Goals (Current goals can be found in the Care Plan section)  Acute Rehab PT Goals Patient Stated Goal: to find out why her BP decreases so much PT Goal Formulation: With patient Time For Goal Achievement: 11/16/18 Potential to Achieve Goals: Good    Frequency Min 3X/week   Barriers to discharge        Co-evaluation               AM-PAC PT "6 Clicks" Mobility  Outcome Measure Help needed turning from your back to your side while in a flat bed without using bedrails?: None Help needed moving from lying on your back to sitting on the side of a flat bed without using bedrails?: None Help needed moving to and from a bed to a chair (including a wheelchair)?: None Help needed standing up from a chair using your arms (e.g., wheelchair or bedside chair)?: None Help needed to walk in hospital room?: A Little Help needed climbing 3-5 steps with a railing? : A Lot 6 Click Score: 21    End of Session   Activity Tolerance: Patient tolerated treatment well Patient left: in bed;with call bell/phone within reach;with bed alarm set Nurse Communication: Mobility status PT Visit Diagnosis: Other abnormalities of gait and mobility (R26.89)    Time: [id number] PT Time Calculation (min) (ACUTE ONLY): 29 min   Charges:   PT  Evaluation $PT Eval Moderate Complexity: 1 Mod PT Treatments $Therapeutic Activity: 8-22 mins        Sherie Don, PT, DPT  Acute Rehabilitation Services Pager [phone] Office [place] 11/02/2018, 10:41 AM

## 2018-11-02 NOTE — Consult Note (Addendum)
[facility] [place] CONSULT NOTE  VASCULAR SURGERY ASSESSMENT & PLAN:   LEFT CAROTID STENOSIS: This patient has an 80% left carotid stenosis by CT angiogram.  However, given the amount of calcific disease the CT scan can sometimes be unreliable.  In February of this year there was a less than 39% carotid stenosis bilaterally. For this reason I have ordered a carotid duplex which is pending.   Her symptoms are not classic for a left hemispheric event.  She had dizziness and syncope.  The day before that she had a visual disturbance bilaterally.  She denied any focal weakness, paresthesias, expressive aphasia, or classic amaurosis fugax.  She notes that she did not get much sleep the night before the event and also felt that she was dehydrated.  She also apparently has some issues with orthostatic hypotension.  She was started on Eliquis recently and her aspirin was discontinued.  I would recommend resuming 81 mg of aspirin.  I have ordered 81 mg of aspirin.  She is on a statin.  If her carotid duplex scan suggests only a mild to moderate left carotid stenosis, given that the symptoms are not classic for a left hemispheric event, I would recommend simply following the carotid stenosis.  If she does have a greater than 80% left carotid stenosis then consideration should be given to carotid endarterectomy.  The carotid bifurcation on the left is high and there is some tortuosity to the artery above the bifurcation which would make the surgery more technically challenging.  However given the amount of calcific disease and the tortuosity distal to the stenosis likewise carotid stenting would be associated with increased risk.  If she were to require surgery certainly we would need cardiology to evaluate her cardiac risk given her recent TAVR and cardiac issues.  I will make further recommendations pending the results of her carotid duplex scan.  Julie Mayo, MD Office:  [phone]    MRN : [id number]  Reason for Consult: Carotid stenosis Referring Physician: Dr. Doristine Bosworth  History of Present Illness: 85 y/o female was brought to the ED with a reported fall and hit her head followed by dizziness.  She states she "blacked out" and doesn't remember falling.  She states prior to her fall she developed sudden loss of vision in both eyes that lasted a few minutes and went away.  She has had several dizzy spells over the last 2 weeks.  She had a previous stroke in 2011 with right sided weakness and aphasia.  She has recovered with speech changes.  She states she is currently at her baseline and feels fine.  Post stroke she was medically managed on Coumadin.  After her TAVR surgery she was placed on asprin.  Recently Dr. Loreta Ave started her on Eliquis and sent her to the Ed  On 10/30/2018.    A CTA of the head and neck were performed which demonstrated left ICA stenosis 80% and right 50%.  She had pre-op work up for her TAVR by Dr. Burt Knack on 02/21/2018 to include carotid duplex which showed <39% B carotid stenosis at the time.    Past medical history:   severe aortic stenosis status post TAVR on 02/2018-on aspirin and Plavix, hypertension, hyperlipidemia, hypothyroidism, peripheral vascular disease, carotid artery stenosis, chronic diastolic congestive heart failure with preserved ejection fraction of 60 to 65%, previous CVA, complete heart block status post pacemaker placement, invasive adenocarcinoma of colon status post hemicolectomy on 05/24/2018 presents to emergency department  for syncope.     Current Facility-Administered Medications  Medication Dose Route Frequency Provider Last Rate Last Dose  . 0.9 %  sodium chloride infusion   Intravenous Continuous Pahwani, Julie R, MD 100 mL/hr at 11/01/18 1634    . acetaminophen (TYLENOL) tablet 650 mg  650 mg Oral Q6H PRN Pahwani, Julie R, MD       Or  . acetaminophen (TYLENOL) suppository 650 mg  650 mg Rectal Q6H PRN  Pahwani, Julie R, MD      . apixaban (ELIQUIS) tablet 5 mg  5 mg Oral BID Pahwani, Julie R, MD   5 mg at 11/02/18 0950  . ferrous sulfate tablet 325 mg  325 mg Oral QODAY Pahwani, Julie R, MD   325 mg at 11/01/18 1836  . levothyroxine (SYNTHROID) tablet 100 mcg  100 mcg Oral QAC breakfast Pahwani, Julie R, MD   100 mcg at 11/02/18 0559  . loratadine (CLARITIN) tablet 10 mg  10 mg Oral Daily Pahwani, Julie R, MD   10 mg at 11/02/18 0950  . ondansetron (ZOFRAN) tablet 4 mg  4 mg Oral Q6H PRN Pahwani, Julie R, MD       Or  . ondansetron (ZOFRAN) injection 4 mg  4 mg Intravenous Q6H PRN Pahwani, Julie R, MD      . pantoprazole (PROTONIX) EC tablet 40 mg  40 mg Oral Daily Pahwani, Julie R, MD   40 mg at 11/02/18 0950  . simvastatin (ZOCOR) tablet 40 mg  40 mg Oral QHS Pahwani, Julie R, MD        Pt meds include: Statin :Yes Betablocker: Yes ASA: Yes Other anticoagulants/antiplatelets: recently started on Eliquis  Past Medical History:  Diagnosis Date  . Arthritis   . Carotid artery stenosis   . Chronic diastolic CHF (congestive heart failure) ([place])   . Family history of skin cancer   . Former smoker    1/2 ppd for 45 years  . History of CVA (cerebrovascular accident) 2011   with residual Mcgee hemiparesis  . Hyperlipidemia   . Hypertension   . Hypothyroidism   . Peripheral vascular disease ([place])   . RBBB   . 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 Severe aortic stenosis     Past Surgical History:  Procedure Laterality Date  . BASAL CELL CARCINOMA EXCISION  2018  . BIOPSY  05/19/2018   Procedure: BIOPSY;  Surgeon: Lavena Bullion, DO;  Location: [facility];  Service: Gastroenterology;;  . CARDIAC CATHETERIZATION    . COLONOSCOPY  2001  . COLONOSCOPY WITH PROPOFOL N/A 05/19/2018   Procedure: COLONOSCOPY WITH PROPOFOL;  Surgeon: Lavena Bullion, DO;   Location: [facility];  Service: Gastroenterology;  Laterality: N/A;  . ESOPHAGOGASTRODUODENOSCOPY (EGD) WITH PROPOFOL N/A 05/19/2018   Procedure: ESOPHAGOGASTRODUODENOSCOPY (EGD) WITH PROPOFOL;  Surgeon: Lavena Bullion, DO;  Location: [facility];  Service: Gastroenterology;  Laterality: N/A;  . HIP SURGERY  2005  . KNEE SURGERY  1998  . LAPAROSCOPIC PARTIAL COLECTOMY N/A 05/24/2018   Procedure: LAPAROSCOPIC ASSISTED ASCENDING HEMICOLECTOMY;  Surgeon: Ileana Roup, MD;  Location: [facility];  Service: General;  Laterality: N/A;  . PACEMAKER IMPLANT N/A 03/02/2018   Procedure: PACEMAKER IMPLANT;  Surgeon: Evans Lance, MD;  Location: [facility];  Service: Cardiovascular;  Laterality: N/A;  . RIGHT/LEFT HEART CATH AND CORONARY ANGIOGRAPHY N/A 02/21/2018   Procedure:  RIGHT/LEFT HEART CATH AND CORONARY ANGIOGRAPHY;  Surgeon: Sherren Mocha, MD;  Location: [facility];  Service: Cardiovascular;  Laterality: N/A;  . SUBMUCOSAL TATTOO INJECTION  05/19/2018   Procedure: SUBMUCOSAL TATTOO INJECTION;  Surgeon: Lavena Bullion, DO;  Location: [facility];  Service: Gastroenterology;;  . TEE WITHOUT CARDIOVERSION N/A 02/28/2018   Procedure: TRANSESOPHAGEAL ECHOCARDIOGRAM (TEE);  Surgeon: Sherren Mocha, MD;  Location: [facility];  Service: Open Heart Surgery;  Laterality: N/A;  . TEMPORARY PACEMAKER N/A 03/01/2018   Procedure: TEMPORARY PACEMAKER;  Surgeon: Burnell Blanks, MD;  Location: [facility];  Service: Cardiovascular;  Laterality: N/A;  . TONSILLECTOMY    . TRANSCATHETER AORTIC VALVE REPLACEMENT, TRANSFEMORAL N/A 02/28/2018   Procedure: TRANSCATHETER AORTIC VALVE REPLACEMENT, TRANSFEMORAL;  Surgeon: Sherren Mocha, MD;  Location: [facility];  Service: Open Heart Surgery;  Laterality: N/A;    Social History Social History   Tobacco Use  . Smoking status: Former Smoker    Packs/day: 1.00    Years: 45.00    Pack years: 45.00    Types:  Cigarettes    Quit date: 05/23/2009    Years since quitting: 9.4  . Smokeless tobacco: Never Used  Substance Use Topics  . Alcohol use: Not Currently  . Drug use: Never    Family History Family History  Problem Relation Age of Onset  . Heart failure Mother   . Chronic Renal Failure Mother   . Alcohol abuse Father   . Skin cancer Sister 76  . Skin cancer Sister 33    Allergies  Allergen Reactions  . Adhesive [Tape] Other (See Comments)    Causes BLISTERS     REVIEW OF SYSTEMS  General: [ ]  Weight loss, [ ]  Fever, [ ]  chills Neurologic: [x ] Dizziness, [x ] Blackouts, [ ]  Seizure [ ]  Stroke, [ ]  "Mini stroke", [x ] Slurred speech, [x ] Temporary blindness; [ ]  weakness in arms or legs, [ ]  Hoarseness [ ]  Dysphagia Cardiac: [ ]  Chest pain/pressure, [ ]  Shortness of breath at rest [ ]  Shortness of breath with exertion, [ ]  Atrial fibrillation or irregular heartbeat  Vascular: [ ]  Pain in legs with walking, [ ]  Pain in legs at rest, [ ]  Pain in legs at night,  [ ]  Non-healing ulcer, [ ]  Blood clot in vein/DVT,   Pulmonary: [ ]  Home oxygen, [ ]  Productive cough, [ ]  Coughing up blood, [ ]  Asthma,  [ ]  Wheezing [ ]  COPD Musculoskeletal:  [ ]  Arthritis, [ ]  Low back pain, [ ]  Joint pain Hematologic: [ ]  Easy Bruising, [ ]  Anemia; [ ]  Hepatitis Gastrointestinal: [ ]  Blood in stool, [ ]  Gastroesophageal Reflux/heartburn, Urinary: [ ]  chronic Kidney disease, [ ]  on HD - [ ]  MWF or [ ]  TTHS, [ ]  Burning with urination, [ ]  Difficulty urinating Skin: [ ]  Rashes, [ ]  Wounds, left brow laceration  Psychological: [ ]  Anxiety, [ ]  Depression  Physical Examination Vitals:   11/01/18 1740 11/01/18 1815 11/02/18 0004 11/02/18 0536  BP: (!) 113/41 (!) 132/50 (!) 123/44 (!) 149/51  Pulse: 64 (!) 59 61 61  Resp: 14 18 16 16   Temp:  97.7 F (36.5 C) 98.1 F (36.7 C) 98.2 F (36.8 C)  TempSrc:  Oral Oral Oral  SpO2: 98% 100% 98% 99%  Weight:      Height:       Body mass index is 30.36  kg/m.  General:  WDWN in  NAD Gait: Normal HENT: WNL Eyes: Pupils equal, left eyebrow laceration  Pulmonary: normal non-labored breathing , without Rales, rhonchi,  wheezing Cardiac: RRR, without  Murmurs, rubs or gallops; No carotid bruits Abdomen: soft, NT, no masses Skin: no rashes, ulcers noted;  no Gangrene , no cellulitis; no open wounds;   Vascular Exam/Pulses:palpable radial and DP pulses B.   Musculoskeletal: no muscle wasting or atrophy; no edema  Neurologic: A&O X 3; Appropriate Affect ;  SENSATION: normal; MOTOR FUNCTION: 5/5 Symmetric Speech is fluent/normal, slight hesitation, sutter   Significant Diagnostic Studies: CBC Lab Results  Component Value Date   WBC 8.0 11/02/2018   HGB 11.5 (L) 11/02/2018   HCT 37.0 11/02/2018   MCV 91.6 11/02/2018   PLT 233 11/02/2018    BMET    Component Value Date/Time   NA 140 11/02/2018 0435   NA 138 03/16/2018 1326   K 3.3 (L) 11/02/2018 0435   CL 112 (H) 11/02/2018 0435   CO2 21 (L) 11/02/2018 0435   GLUCOSE 95 11/02/2018 0435   BUN 16 11/02/2018 0435   BUN 15 03/16/2018 1326   CREATININE 0.95 11/02/2018 0435   CALCIUM 8.5 (L) 11/02/2018 0435   GFRNONAA 55 (L) 11/02/2018 0435   GFRAA >60 11/02/2018 0435   Estimated Creatinine Clearance: 42.6 mL/min (by C-G formula based on SCr of 0.95 mg/dL).  COAG Lab Results  Component Value Date   INR 1.4 (H) 10/31/2018   INR 1.2 05/23/2018   INR 1.2 05/17/2018     Non-Invasive Vascular Imaging:  CTA  IMPRESSION: Atherosclerotic disease at both carotid bifurcations. 30% stenosis of the proximal ICA on the right. 80% stenosis in the ICA bulb on the left.  Atherosclerotic disease in both carotid siphon regions and supraclinoid internal carotid arteries. Maximal stenosis 50% on both sides. Beyond that, no intracranial large or medium vessel occlusion or correctable proximal stenosis is seen.  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 30% stenosis of  the left vertebral artery origin. Reconstituted flow in a tiny distal right vertebral artery at the skull base level gives minimal contribution to the basilar. No basilar stenosis or posterior circulation branch vessel occlusion.  CTA of the head IMPRESSION: Atherosclerotic disease at both carotid bifurcations. 30% stenosis of the proximal ICA on the right. 80% stenosis in the ICA bulb on the left.  Atherosclerotic disease in both carotid siphon regions and supraclinoid internal carotid arteries. Maximal stenosis 50% on both sides. Beyond that, no intracranial large or medium vessel occlusion or correctable proximal stenosis is seen.  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 30% stenosis of the left vertebral artery origin. Reconstituted flow in a tiny distal right vertebral artery at the skull base level gives minimal contribution to the basilar. No basilar stenosis or posterior circulation branch vessel occlusion.   ASSESSMENT/PLAN:  Symptomatic left ICA stenosis with amaurosis, blackouts and dizziness. She had a Carotid duplex in February of this year that showed < 39 % stenosis B, history of CVA 2011 and now symptomatic > 80 % left ICA.  She may be a candidate for TCAR.  Dr. Scot Dock will review her history and CTA to make his recommendations.    Julie Mcgee 11/02/2018 11:28 AM

## 2018-11-02 NOTE — Care Management Obs Status (Signed)
[facility] NOTIFICATION   Patient Details  Name: Julie Mcgee MRN: [id number] Date of Birth: December 31, 1933   Medicare Observation Status Notification Given:  Yes    Marilu Favre, RN 11/02/2018, 2:25 PM

## 2018-11-03 MED ORDER — METOPROLOL TARTRATE 50 MG PO TABS
50.0000 mg | ORAL_TABLET | Freq: Two times a day (BID) | ORAL | Status: DC
  Administered 2018-11-03: 50 mg via ORAL
  Filled 2018-11-03: qty 1

## 2018-11-03 MED ORDER — FUROSEMIDE 20 MG PO TABS: 20.0000 mg | ORAL_TABLET | Freq: Every day | ORAL | Status: DC | PRN

## 2018-11-03 MED ORDER — ASPIRIN 81 MG PO TBEC: 81.0000 mg | DELAYED_RELEASE_TABLET | Freq: Every day | ORAL | Status: DC

## 2018-11-03 MED ORDER — ENALAPRILAT 1.25 MG/ML IV SOLN
1.2500 mg | Freq: Once | INTRAVENOUS | Status: AC
  Administered 2018-11-03: 1.25 mg via INTRAVENOUS
  Filled 2018-11-03: qty 1

## 2018-11-03 NOTE — Progress Notes (Signed)
   VASCULAR SURGERY ASSESSMENT & PLAN:   LEFT CAROTID STENOSIS: Per carotid duplex scan shows a less than 39% carotid stenosis bilaterally.  In addition, both vertebral arteries are patent with antegrade flow.  He does have calcific plaque at both carotid bifurcations which explains why the CT angiogram is less reliable for determining the severity of the stenosis.  I do not think her presenting symptoms can be attributed to her mild carotid disease.  Likewise based on her duplex, I do not think she has vertebrobasilar insufficiency.  I have added aspirin to her regimen and I have arranged for a follow-up carotid duplex scan in 6 months.  Vascular surgery will be available as needed.   SUBJECTIVE:   No new symptoms overnight.  PHYSICAL EXAM:   Vitals:   11/02/18 2212 11/02/18 2350 11/03/18 0559 11/03/18 0611  BP: (!) 153/59  (!) 201/70 (!) 198/66  Pulse: 63  (!) 59 60  Resp: 18  16   Temp:  98.1 F (36.7 C) (!) 97.5 F (36.4 C)   TempSrc:  Oral Oral   SpO2: 97%  100%   Weight:      Height:       No change in neurologic exam.  Mild right upper extremity weakness and expressive aphasia from stroke in 2011  DATA:   CAROTID DUPLEX: I have independently interpreted her carotid duplex scan.  On the right side there is a less than 39% stenosis.  The right vertebral artery is patent with antegrade flow.  On the left side there is a less than 39% stenosis.  The left vertebral artery is patent with antegrade flow.  Lab Results  Component Value Date   WBC 8.0 11/02/2018   HGB 11.5 (L) 11/02/2018   HCT 37.0 11/02/2018   MCV 91.6 11/02/2018   PLT 233 11/02/2018   Lab Results  Component Value Date   CREATININE 0.95 11/02/2018   Lab Results  Component Value Date   INR 1.4 (H) 10/31/2018   CBG (last 3)  Recent Labs    10/31/18 1144  GLUCAP 83    PROBLEM LIST:    Principal Problem:   Syncope Active Problems:   Hypertension   Hyperlipidemia   Hypothyroidism   Severe  aortic stenosis   History of stroke   S/P TAVR (transcatheter aortic valve replacement)   Complete heart block ([facility])   Malignant neoplasm of ascending colon ([facility])   Chronic diastolic CHF (congestive heart failure) ([facility])   Rash   Orthostatic syncope   CURRENT MEDS:   . apixaban  5 mg Oral BID  . aspirin EC  81 mg Oral Daily  . enalaprilat  1.25 mg Intravenous Once  . ferrous sulfate  325 mg Oral QODAY  . levothyroxine  100 mcg Oral QAC breakfast  . loratadine  10 mg Oral Daily  . metoprolol tartrate  25 mg Oral BID  . pantoprazole  40 mg Oral Daily  . simvastatin  40 mg Oral QHS    Deitra Mayo Office: [phone] 11/03/2018

## 2018-11-03 NOTE — Progress Notes (Signed)
Physical Therapy Treatment Patient Details Name: Julie Mcgee MRN: [id number] DOB: [birth year] Today's Date: 11/03/2018    History of Present Illness Pt is an 85 y/o female admitted from home after having a syncopal episode in which she fell and hit her head. CT of her head was negative for any acute findings. MRI unable to be obtained secondary to pacemaker. Pt found to be dehydrated. PMH including but not limited to CHF, HTN, HLD, PVD.    PT Comments    Pt making good progress with mobility and tolerated ambulation within her room with RW and min guard for safety. Pt with no LOB and asymptomatic throughout. Pt's BP at beginning of session was 132/53 mmHg. Pt would continue to benefit from skilled physical therapy services at this time while admitted and after d/c to address the below listed limitations in order to improve overall safety and independence with functional mobility.    Follow Up Recommendations  Home health PT;Supervision/Assistance - 24 hour     Equipment Recommendations  None recommended by PT    Recommendations for Other Services       Precautions / Restrictions Precautions Precautions: Fall Precaution Comments: monitor BP Restrictions Weight Bearing Restrictions: No    Mobility  Bed Mobility Overal bed mobility: Needs Assistance Bed Mobility: Supine to Sit;Sit to Supine     Supine to sit: Supervision Sit to supine: Supervision   General bed mobility comments: HOB elevated, no physical assistance needed  Transfers Overall transfer level: Needs assistance Equipment used: Rolling walker (2 wheeled) Transfers: Sit to/from Stand Sit to Stand: Supervision         General transfer comment: good technique utilized, no physical assistance needed  Ambulation/Gait Ambulation/Gait assistance: Min guard Gait Distance (Feet): 20 Feet Assistive device: Rolling walker (2 wheeled) Gait Pattern/deviations: Step-through pattern;Decreased stride length Gait  velocity: decreased   General Gait Details: pt overall generally steady with use of RW; no overt LOB or need for physical assistance, min guard for safety with navigation around room   Stairs             Wheelchair Mobility    Modified Rankin (Stroke Patients Only)       Balance Overall balance assessment: Needs assistance Sitting-balance support: Feet supported Sitting balance-Leahy Scale: Good     Standing balance support: During functional activity Standing balance-Leahy Scale: Fair                              Cognition Arousal/Alertness: Awake/alert Behavior During Therapy: WFL for tasks assessed/performed Overall Cognitive Status: No family/caregiver present to determine baseline cognitive functioning Area of Impairment: Memory;Safety/judgement;Problem solving                     Memory: Decreased short-term memory   Safety/Judgement: Decreased awareness of deficits;Decreased awareness of safety   Problem Solving: Requires verbal cues        Exercises      General Comments        Pertinent Vitals/Pain Pain Assessment: No/denies pain    Home Living                      Prior Function            PT Goals (current goals can now be found in the care plan section) Acute Rehab PT Goals PT Goal Formulation: With patient Time For Goal Achievement: 11/16/18 Potential to Achieve Goals: Good Progress  towards PT goals: Progressing toward goals    Frequency    Min 3X/week      PT Plan Current plan remains appropriate    Co-evaluation              AM-PAC PT "6 Clicks" Mobility   Outcome Measure  Help needed turning from your back to your side while in a flat bed without using bedrails?: None Help needed moving from lying on your back to sitting on the side of a flat bed without using bedrails?: None Help needed moving to and from a bed to a chair (including a wheelchair)?: None Help needed standing up  from a chair using your arms (e.g., wheelchair or bedside chair)?: None Help needed to walk in hospital room?: A Little Help needed climbing 3-5 steps with a railing? : A Lot 6 Click Score: 21    End of Session Equipment Utilized During Treatment: Gait belt Activity Tolerance: Patient tolerated treatment well Patient left: in bed;with call bell/phone within reach Nurse Communication: Mobility status PT Visit Diagnosis: Other abnormalities of gait and mobility (R26.89)     Time: [id number] PT Time Calculation (min) (ACUTE ONLY): 17 min  Charges:  $Gait Training: 8-22 mins                     Sherie Don, PT, DPT  Acute Rehabilitation Services Pager [phone] Office [place] 11/03/2018, 9:50 AM

## 2018-11-03 NOTE — Discharge Summary (Signed)
Physician Discharge Summary  Joselle Bumpas [id number] DOB: 1933/12/14 DOA: 11/01/2018  PCP: Lyman Bishop, DO  Admit date: 11/01/2018 Discharge date: 11/03/2018  Admitted From: home Discharge disposition:home   Recommendations for Outpatient Follow-Up:   1. Suspect needs a higher level of care or more support to remain in ILF 2. Home health 3. Vascular follow up 4. TED hose if able   Discharge Diagnosis:   Principal Problem:   Syncope Active Problems:   Hypertension   Hyperlipidemia   Hypothyroidism   Severe aortic stenosis   History of stroke   S/P TAVR (transcatheter aortic valve replacement)   Complete heart block (HCC)   Malignant neoplasm of ascending colon (HCC)   Chronic diastolic CHF (congestive heart failure) (HCC)   Rash   Orthostatic syncope    Discharge Condition: Improved.  Diet recommendation: Low sodium, heart healthy  Wound care: None.  Code status: Full.   History of Present Illness:   Julie Mcgee is a 85 y.o. female with medical history significant of severe aortic stenosis status post TAVR on 02/2018-on aspirin and Plavix, hypertension, hyperlipidemia, hypothyroidism, peripheral vascular disease, carotid artery stenosis, chronic diastolic congestive heart failure with preserved ejection fraction of 60 to 65%, previous CVA, complete heart block status post pacemaker placement, invasive adenocarcinoma of colon status post hemicolectomy on 05/24/2018 presents to emergency department for syncope.  Patient reports that she was brushing her teeth and fell this morning and hit on her head.  Her husband called EMS and brought patient to the emergency department for further evaluation and management.  Reports nausea before the fall however denies association with seizures, headache, blurry vision, lightheadedness, nausea, vomiting, diarrhea, fever, chills, chest pain, shortness of breath, palpitation, leg swelling.  Patient was seen  in the ED yesterday with dizziness.  MRI was not able to perform due to pacemaker.  CTA of head and neck was obtained which showed bilateral stenosis of ICA.  Possibility 80% of left ICA.  EDP talk to the neurology and schedule patient for outpatient follow-up.  Has rash on left upper arm-due to blood pressure cuff.  Denies association with itching, fever or chills.    Hospital Course by Problem:   Syncope: -Likely secondary to orthostatic hypotension secondary to dehydration. -Patient orthostatic vitals positive in the ED. CT head without contrast came back negative. Reviewed CTA of head and neck from yesterday-- ? 80% stenosis on left--- overestimated based on U/S-- seen by vascular and they do not think is contributing -PT home health  Laceration over lefteye: -S/p fall. Repaired in the ED. -Tylenol as needed for pain.  Hypertension: -resume  metoprolol  Hyperlipidemia: -Continue statin.  Chronic diastolic congestive heart failure: Not in acute exacerbation. -With preserved ejection fraction of 60 to 65% -No signs of fluid overload. -She takes Lasix 20 mg as needed for leg edema. -Continue aspirin and statin  Complete heart block: Status post pacemaker  Severe aortic stenosis status post TAVR: Aware -add ASA and continue Eliquis.  GERD: Stable  -continue Protonix  Hypothyroidism:  -Continue levothyroxine  CKD stage IIIa: At baseline -Monitor BMP. Avoid nephrotoxic medication.  History of adenocarcinoma of colon: Status post hemicolectomy on 05/2018. -H&H is stable.  obesity Body mass index is 30.36 kg/m.    Medical Consultants:    vascular  Discharge Exam:   Vitals:   11/03/18 0611 11/03/18 0904  BP: (!) 198/66 (!) 132/53  Pulse: 60 60  Resp:  18  Temp:  (!) 97.3 F (  36.3 C)  SpO2:  100%   Vitals:   11/02/18 2350 11/03/18 0559 11/03/18 0611 11/03/18 0904  BP:  (!) 201/70 (!) 198/66 (!) 132/53  Pulse:  (!) 59 60 60  Resp:   16  18  Temp: 98.1 F (36.7 C) (!) 97.5 F (36.4 C)  (!) 97.3 F (36.3 C)  TempSrc: Oral Oral  Oral  SpO2:  100%  100%  Weight:      Height:        General exam: Appears calm and comfortable.   The results of significant diagnostics from this hospitalization (including imaging, microbiology, ancillary and laboratory) are listed below for reference.     Procedures and Diagnostic Studies:   Ct Head Wo Contrast  Result Date: 11/01/2018 CLINICAL DATA:  85 year old female with head trauma and ataxia. EXAM: CT HEAD WITHOUT CONTRAST TECHNIQUE: Contiguous axial images were obtained from the base of the skull through the vertex without intravenous contrast. COMPARISON:  Head CT dated 10/31/2018 FINDINGS: Brain: Mild age-related atrophy and moderate chronic microvascular ischemic changes. Large areas of old infarct and encephalomalacia noted in the cerebellar hemispheres. There is no acute intracranial hemorrhage. No mass effect or midline shift. No extra-axial fluid collection. Vascular: No hyperdense vessel or unexpected calcification. Skull: Normal. Negative for fracture or focal lesion. Sinuses/Orbits: No acute finding. Other: None IMPRESSION: 1. No acute intracranial hemorrhage. 2. Age-related atrophy and chronic microvascular ischemic changes. Large areas of old infarct and encephalomalacia in the cerebellar hemispheres. Electronically Signed   By: Anner Crete M.D.   On: 11/01/2018 12:40   Vas US Carotid  Result Date: 11/02/2018 Carotid Arterial Duplex Study Indications:       Stenosis noted by CTA. Risk Factors:      Hyperlipidemia, prior CVA. Limitations        Today's exam was limited due to heavy calcification and the                    resulting shadowing, the patient's respiratory variation,                    patient movement and the high bifurcation of the carotid. Comparison Study:  10/31/2018 - CTA neck showed 30% right ICA stenosis and 80%                    left ICA stenosis.  Performing Technologist: Oliver Hum RVT  Examination Guidelines: A complete evaluation includes B-mode imaging, spectral Doppler, color Doppler, and power Doppler as needed of all accessible portions of each vessel. Bilateral testing is considered an integral part of a complete examination. Limited examinations for reoccurring indications may be performed as noted.  Right Carotid Findings: +----------+-------+-------+--------+---------------------------------+--------+           PSV    EDV    StenosisPlaque Description               Comments           cm/s   cm/s                                                     +----------+-------+-------+--------+---------------------------------+--------+ CCA Prox  71     7              smooth and heterogenous                   +----------+-------+-------+--------+---------------------------------+--------+  CCA Distal52     9              smooth and heterogenous                   +----------+-------+-------+--------+---------------------------------+--------+ ICA Prox  53     11             irregular, heterogenous and                                               calcific                                  +----------+-------+-------+--------+---------------------------------+--------+ ICA Distal64     15                                              tortuous +----------+-------+-------+--------+---------------------------------+--------+ ECA       157    0                                                        +----------+-------+-------+--------+---------------------------------+--------+ +----------+--------+-------+--------+-------------------+           PSV cm/sEDV cmsDescribeArm Pressure (mmHG) +----------+--------+-------+--------+-------------------+ Subclavian129                                        +----------+--------+-------+--------+-------------------+  +---------+--------+--+--------+-+---------+ VertebralPSV cm/s28EDV cm/s4Antegrade +---------+--------+--+--------+-+---------+  Left Carotid Findings: +----------+-------+-------+--------+---------------------------------+--------+           PSV    EDV    StenosisPlaque Description               Comments           cm/s   cm/s                                                     +----------+-------+-------+--------+---------------------------------+--------+ CCA Prox  60     9              smooth and heterogenous          tortuous +----------+-------+-------+--------+---------------------------------+--------+ CCA Distal50     7              irregular, heterogenous and                                               calcific                                  +----------+-------+-------+--------+---------------------------------+--------+ ICA Prox  68     13  calcific and heterogenous                 +----------+-------+-------+--------+---------------------------------+--------+ ICA Distal75     16                                              tortuous +----------+-------+-------+--------+---------------------------------+--------+ ECA       97     0                                                        +----------+-------+-------+--------+---------------------------------+--------+ +----------+--------+--------+--------+-------------------+           PSV cm/sEDV cm/sDescribeArm Pressure (mmHG) +----------+--------+--------+--------+-------------------+ [id number]                                         +----------+--------+--------+--------+-------------------+ +---------+--------+--+--------+-+---------+ VertebralPSV cm/s43EDV cm/s6Antegrade +---------+--------+--+--------+-+---------+  Summary: Right Carotid: Velocities in the right ICA are consistent with a 1-39% stenosis. Left Carotid: Velocities in the left ICA are  consistent with a 1-39% stenosis.               The proximal ICA is heavily calcified. Likely more stenotic within               the area of calcification. Vertebrals: Bilateral vertebral arteries demonstrate antegrade flow. *See table(s) above for measurements and observations.  Electronically signed by Deitra Mayo MD on 11/02/2018 at 5:10:23 PM.    Final      Labs:   Basic Metabolic Panel: Recent Labs  Lab 10/31/18 1106 11/01/18 1328 11/01/18 1822 11/02/18 0435  NA 139 140  --  140  K 4.0 4.2  --  3.3*  CL 105 106  --  112*  CO2 24 23  --  21*  GLUCOSE 75 87  --  95  BUN 18 21  --  16  CREATININE 1.09* 1.05*  --  0.95  CALCIUM 9.7 9.3  --  8.5*  MG  --   --  1.9  --    GFR Estimated Creatinine Clearance: 42.6 mL/min (by C-G formula based on SCr of 0.95 mg/dL). Liver Function Tests: Recent Labs  Lab 10/31/18 1106 11/01/18 1328  AST 21 26  ALT 12 13  ALKPHOS 63 57  BILITOT 0.7 0.8  PROT 7.3 6.4*  ALBUMIN 3.8 3.4*   No results for input(s): LIPASE, AMYLASE in the last 168 hours. No results for input(s): AMMONIA in the last 168 hours. Coagulation profile Recent Labs  Lab 10/31/18 1106  INR 1.4*    CBC: Recent Labs  Lab 10/31/18 1106 11/01/18 1328 11/02/18 0435  WBC 9.2 9.5 8.0  NEUTROABS 6.8 8.0*  --   HGB 12.6 13.2 11.5*  HCT 40.3 42.9 37.0  MCV 90.8 92.3 91.6  PLT 267 282 233   Cardiac Enzymes: No results for input(s): CKTOTAL, CKMB, CKMBINDEX, TROPONINI in the last 168 hours. BNP: Invalid input(s): POCBNP CBG: Recent Labs  Lab 10/31/18 1144  GLUCAP 83   D-Dimer No results for input(s): DDIMER in the last 72 hours. Hgb A1c No results for input(s): HGBA1C in the last 72 hours. Lipid Profile No results for  input(s): CHOL, HDL, LDLCALC, TRIG, CHOLHDL, LDLDIRECT in the last 72 hours. Thyroid function studies Recent Labs    11/01/18 1822  TSH 3.742   Anemia work up No results for input(s): VITAMINB12, FOLATE, FERRITIN, TIBC, IRON,  RETICCTPCT in the last 72 hours. Microbiology Recent Results (from the past 240 hour(s))  SARS CORONAVIRUS 2 (TAT 6-24 HRS) Nasopharyngeal Nasopharyngeal Swab     Status: None   Collection Time: 11/01/18  1:46 PM   Specimen: Nasopharyngeal Swab  Result Value Ref Range Status   SARS Coronavirus 2 NEGATIVE NEGATIVE Final    Comment: (NOTE) SARS-CoV-2 target nucleic acids are NOT DETECTED. The SARS-CoV-2 RNA is generally detectable in upper and lower respiratory specimens during the acute phase of infection. Negative results do not preclude SARS-CoV-2 infection, do not rule out co-infections with other pathogens, and should not be used as the sole basis for treatment or other patient management decisions. Negative results must be combined with clinical observations, patient history, and epidemiological information. The expected result is Negative. Fact Sheet for Patients: SugarRoll.be Fact Sheet for Healthcare Providers: https://www.woods-mathews.com/ This test is not yet approved or cleared by the [place] FDA and  has been authorized for detection and/or diagnosis of SARS-CoV-2 by FDA under an Emergency Use Authorization (EUA). This EUA will remain  in effect (meaning this test can be used) for the duration of the COVID-19 declaration under Section 56 4(b)(1) of the Act, 21 U.S.C. section 360bbb-3(b)(1), unless the authorization is terminated or revoked sooner. Performed at [facility], [place] [address], [place], [place] [address]      Discharge Instructions:   Discharge Instructions    Diet - low sodium heart healthy   Complete by: As directed    Discharge instructions   Complete by: As directed    Home health PT with family supervision Get up slowly Keep legs elevated when sitting-- "pumping" ankles/legs prior to getting up can help if you are unable to wear compression hose   Increase activity slowly   Complete by: As  directed      Allergies as of 11/03/2018      Reactions   Adhesive [tape] Other (See Comments)   Causes BLISTERS      Medication List    TAKE these medications   amoxicillin 500 MG tablet Commonly known as: AMOXIL Please take 2,000 mg (4 tablets) one hour prior to all dental procedures.   apixaban 5 MG Tabs tablet Commonly known as: Eliquis Take 1 tablet (5 mg total) by mouth 2 (two) times daily.   aspirin 81 MG EC tablet Take 1 tablet (81 mg total) by mouth daily. Start taking on: November 04, 2018   CALTRATE 600+D3 PO Take 1 tablet by mouth 2 (two) times daily.   diphenhydrAMINE 25 MG tablet Commonly known as: BENADRYL Take 25 mg by mouth every 6 (six) hours as needed for itching or allergies.   ferrous sulfate 325 (65 FE) MG tablet Take 1 tablet (325 mg total) by mouth every other day.   furosemide 20 MG tablet Commonly known as: LASIX Take 1 tablet (20 mg total) by mouth daily as needed for fluid or edema.   levothyroxine 100 MCG tablet Commonly known as: SYNTHROID Take 100 mcg by mouth daily before breakfast.   Melatonin 5 MG Caps Take 5 mg by mouth at bedtime as needed (sleep).   metoprolol tartrate 50 MG tablet Commonly known as: LOPRESSOR Take 50 mg by mouth 2 (two) times daily.  Multivitamin Adults 50+ Tabs Take 1 tablet by mouth daily.   pantoprazole 40 MG tablet Commonly known as: PROTONIX Take 1 tablet (40 mg total) by mouth daily.   saccharomyces boulardii 250 MG capsule Commonly known as: FLORASTOR Take 250 mg by mouth 2 (two) times daily.   simvastatin 40 MG tablet Commonly known as: ZOCOR Take 1 tablet (40 mg total) by mouth at bedtime.      Follow-up Information    Lyman Bishop, DO Follow up in 1 week(s).   Specialty: Family Medicine Contact information: [place] [address] [phone]        Angelia Mould, MD Follow up in 6 month(s).   Specialties: Vascular Surgery, Cardiology  Contact information: [place] [address] [phone]            Time coordinating discharge: 35 min  Signed:  Geradine Girt DO  [facility] 11/03/2018, 12:36 PM

## 2018-11-06 ENCOUNTER — Institutional Professional Consult (permissible substitution): Payer: Medicare Other | Admitting: Neurology

## 2018-11-06 ENCOUNTER — Encounter: Payer: Self-pay | Admitting: Neurology

## 2018-11-06 ENCOUNTER — Telehealth: Payer: Self-pay

## 2018-11-06 DIAGNOSIS — R296 Repeated falls: Secondary | ICD-10-CM | POA: Diagnosis not present

## 2018-11-06 DIAGNOSIS — I130 Hypertensive heart and chronic kidney disease with heart failure and stage 1 through stage 4 chronic kidney disease, or unspecified chronic kidney disease: Secondary | ICD-10-CM | POA: Diagnosis not present

## 2018-11-06 DIAGNOSIS — I739 Peripheral vascular disease, unspecified: Secondary | ICD-10-CM | POA: Diagnosis not present

## 2018-11-06 DIAGNOSIS — Z7901 Long term (current) use of anticoagulants: Secondary | ICD-10-CM | POA: Diagnosis not present

## 2018-11-06 DIAGNOSIS — I5032 Chronic diastolic (congestive) heart failure: Secondary | ICD-10-CM | POA: Diagnosis not present

## 2018-11-06 DIAGNOSIS — S01112D Laceration without foreign body of left eyelid and periocular area, subsequent encounter: Secondary | ICD-10-CM | POA: Diagnosis not present

## 2018-11-06 DIAGNOSIS — Z950 Presence of cardiac pacemaker: Secondary | ICD-10-CM | POA: Diagnosis not present

## 2018-11-06 DIAGNOSIS — Z87891 Personal history of nicotine dependence: Secondary | ICD-10-CM | POA: Diagnosis not present

## 2018-11-06 DIAGNOSIS — C182 Malignant neoplasm of ascending colon: Secondary | ICD-10-CM | POA: Diagnosis not present

## 2018-11-06 DIAGNOSIS — N183 Chronic kidney disease, stage 3 unspecified: Secondary | ICD-10-CM | POA: Diagnosis not present

## 2018-11-06 DIAGNOSIS — I69322 Dysarthria following cerebral infarction: Secondary | ICD-10-CM | POA: Diagnosis not present

## 2018-11-06 DIAGNOSIS — R55 Syncope and collapse: Secondary | ICD-10-CM | POA: Diagnosis not present

## 2018-11-06 DIAGNOSIS — I69331 Monoplegia of upper limb following cerebral infarction affecting right dominant side: Secondary | ICD-10-CM | POA: Diagnosis not present

## 2018-11-06 NOTE — Telephone Encounter (Signed)
Pt did not show for their appt with Dr. Athar today.  

## 2018-11-09 DIAGNOSIS — R970 Elevated carcinoembryonic antigen [CEA]: Secondary | ICD-10-CM | POA: Diagnosis not present

## 2018-11-10 ENCOUNTER — Encounter: Payer: Self-pay | Admitting: Hematology

## 2018-11-10 ENCOUNTER — Telehealth: Payer: Self-pay

## 2018-11-10 ENCOUNTER — Other Ambulatory Visit: Payer: Self-pay

## 2018-11-10 ENCOUNTER — Other Ambulatory Visit: Payer: Self-pay | Admitting: Cardiology

## 2018-11-10 ENCOUNTER — Inpatient Hospital Stay: Payer: Medicare Other | Attending: Hematology | Admitting: Hematology

## 2018-11-10 DIAGNOSIS — D649 Anemia, unspecified: Secondary | ICD-10-CM

## 2018-11-10 DIAGNOSIS — N649 Disorder of breast, unspecified: Secondary | ICD-10-CM

## 2018-11-10 DIAGNOSIS — C182 Malignant neoplasm of ascending colon: Secondary | ICD-10-CM | POA: Diagnosis not present

## 2018-11-10 NOTE — Telephone Encounter (Signed)
Called and spoke with patient's son-verified DPR-William informed of MD recommendations and RN has scheduled patient for a virtual appt on 11/30/2018 at 10:20 am; [name] advised to call back to the office should questions/concerns arise; [name] verbalized understanding of information/instructions;

## 2018-11-10 NOTE — Telephone Encounter (Signed)
-----   Message from [place], DO sent at 11/10/2018 12:46 PM EDT ----- Regarding: FW: Surveillance colonoscopy Can you please set up follow-up appointment for this patient with me in November or December to discuss ongoing surveillance plans.  Thank you ----- Message ----- From: Tish Men, MD Sent: 11/10/2018  12:18 PM EDT To: Lavena Bullion, DO Subject: Surveillance colonoscopy                       Hi [name],  You saw Ms. Scavone in the hospital back in May 2020 for anemia and was found with Stage I colon cancer s/p resection. She also has a breast mass, but she and her family have declined work-up on several occasions. However, they are agreeable to surveillance colonoscopy.  I am turning her back to her PCP, and just want to put her on your radar for her colonoscopy when it's time.   Thanks.  Eulas Post

## 2018-11-10 NOTE — Progress Notes (Signed)
[place]  HEMATOLOGY-ONCOLOGY TeleHEALTH VISIT PROGRESS NOTE   I connected with Julie Mcgee on 11/10/18 at 12:00 PM EDT by video enabled telemedicine visit and verified that I am speaking with the correct person using two identifiers.   I discussed the limitations, risks, security and privacy concerns of performing an evaluation and management service by telemedicine and the availability of in-person appointments. I also discussed with the patient that there may be a patient responsible charge related to this service. The patient expressed understanding and agreed to proceed.   Other persons participating in the visit and their role in the encounter: patient's son  Patient's location: home  Provider's location: clinic  Patient Care Team: Masneri, Adele Barthel, DO as PCP - General (Family Medicine) Revankar, Reita Cliche, MD as PCP - Cardiology (Cardiology) Sherren Mocha, MD as PCP - Structural Heart (Cardiology) Evans Lance, MD as PCP - Electrophysiology (Cardiology) Rosalin Hawking, MD as Consulting Physician (Neurology) Cordelia Poche, RN as Oncology Nurse Navigator Tish Men, MD as Medical Oncologist (Oncology)  HEME/ONC OVERVIEW: 1. 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 Incidental R breast mass -05/2018: staging scans showed a 2.6cm lobulated mass in the R central breast, as well as enlarged mediastinal and hilar LN's (largest pre-tracheal LN 1.9cm), stable since 02/2018 -Patient and her family declined work-up   TREATMENT REGIMEN:  05/24/2018: right hemicolectomy and ileal resection   On observation   PERTINENT NON-HEM/ONC PROBLEMS: 1. Severe aortic stenosis s/p  TAVR in 02/2018  -Hospitalization complicated by complete heart block and brief asystolic requiring CPR and inotrope; permanent pacemaker placed during the hospitalization  2. Chronic HFpEF 3. Hx of CVA with residual R hemiparesis   ASSESSMENT & PLAN:   Incidental R breast mass -Noted incidentally on scans in 05/2018 during work-up for colon cancer  -Despite lengthy discussions, patient and her family had declined work-up -I emphasized with the patient and her family that if this represents breast cancer, it may be potentially curable in the early stage, but delaying work-up and treatment may lead to metastatic disease -Patient and her family expressed understanding, and confirmed that they did not wish to pursue any further work-up   Stage I adenocarcinoma of the ascending colon  -S/p right hemicolectomy and ileal resection in 05/2018 -No indication for adjuvant chemotherapy or radiation -Colonoscopy 1 year after surgery (ie 05/2019) with Dr. 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 The patient was provided an opportunity to ask questions and all were answered. The patient agreed with the plan and demonstrated an understanding of the instructions.   The patient was advised to call back or seek an in-person evaluation if the symptoms worsen or if the condition fails to improve as  anticipated.  I provided 15 minutes of face-to-face video visit time during this encounter, and > 50% was spent counseling as documented under my assessment & plan.   As the patient will have colon cancer surveillance with gastroenterology and has declined the work-up for R breast mass on several visits, she can continue follow-up with her PCP and gastroenterology, and return to oncology as needed.   Tish Men, MD 11/10/2018 11:43 AM   CHIEF COMPLAINTS:  "I am doing fine"  INTERVAL HISTORY: Julie Mcgee is seen via telemedicine for history of Stage I colon cancer and incidental right breast mass.  Patient was recently seen in the ER after falling in the bathroom and hitting her head.  She reports that since the ER visit, she has not had any recurrent falls or syncope.  She has occasional, transient, vague lower abdominal quadrant discomfort that resolves without any intervention.  She otherwise denies any constitutional symptoms, chest pain, dyspnea, nausea, vomiting, diarrhea, hematochezia or melena.   REVIEW OF SYSTEMS:   Constitutional: ( - ) fevers, ( - )  chills , ( - ) night sweats Eyes: ( - ) blurriness of vision, ( - ) double vision, ( - ) watery eyes Ears, nose, mouth, throat, and face: ( - ) mucositis, ( - ) sore throat Respiratory: ( - ) cough, ( - ) dyspnea, ( - ) wheezes Cardiovascular: ( - ) palpitation, ( - ) chest discomfort, ( - ) lower extremity swelling Gastrointestinal:  ( - ) nausea, ( - ) heartburn, ( - ) change in bowel habits Skin: ( - ) abnormal skin rashes Lymphatics: ( - ) new lymphadenopathy, ( - ) easy bruising Neurological: ( - ) numbness, ( - ) tingling, ( - ) new weaknesses Behavioral/Psych: ( - ) mood change, ( - ) new changes  All other systems were reviewed with the patient and are negative.  I have reviewed her chart and materials related to her cancer extensively and collaborated history with the patient. Summary of oncologic history is as  follows: Oncology History  Malignant neoplasm of ascending colon [facility])   Initial Diagnosis   Malignant neoplasm of ascending colon ([place])   05/19/2018 Procedure   Colonoscopy: - Likely malignant tumor in the ascending colon. Biopsied. Tattooed. This is the source of the anemia. - A few 2 to 5 mm polyps in the rectum, in the sigmoid colon and in the ascending colon. - Non-bleeding internal hemorrhoids. - The examined portion of the ileum was normal.   05/19/2018 Pathology Results   1. Duodenum, Biopsy - BENIGN SMALL BOWEL MUCOSA. - NO VILLOUS BLUNTING OR INCREASE IN INTRAEPITHELIAL LYMPHOCYTES. - NO DYSPLASIA OR MALIGNANCY. 2. Stomach, polyp(s) - FUNDIC GLAND POLYP. - WARTHIN-STARRY IS NEGATIVE FOR HELICOBACTER PYLORI. - NO INTESTINAL METAPLASIA, DYSPLASIA, OR MALIGNANCY. 3. Colon, biopsy, ascending - INVASIVE ADENOCARCINOMA.   05/20/2018 Imaging   CT CAP: IMPRESSION: 1. No CT findings of the abdomen or pelvis to localize suspected GI bleeding. No intraluminal contrast noted.  2. Focal soft tissue thickening of the colon wall superior to the ileocecal valve may reflect reported ascending colon mass.  3. There is extensive fatty mural stratification of the cecum, suggesting chronic inflammatory sequelae of colitis, including Crohn's disease.  4. Moderate right, small left pleural effusions and associated atelectasis or consolidation. There is mild, diffuse interlobular septal thickening, most consistent with edema.  5.  Interval placement of aortic valve stent endograft.  6. There is a lobulated 2.6 cm mass in the central right  breast (series 3, image 39), as seen on prior examination. Correlate with mammography.  7.  Other chronic and incidental findings as detailed above.   05/24/2018 Pathology Results   Accession: [id number]  Procedure: Right colon and ileum resection. Tumor Site: Proximal right colon. Tumor Size: 2.5 cm. Macroscopic Tumor Perforation: Not  identified. Histologic Type: Invasive adenocarcinoma. Histologic Grade: G2, moderately differentiated. Tumor Extension: Into submucosa. Margins: Negative. Treatment Effect: N/A. Lymphovascular Invasion: N/A. Perineural Invasion: N/A. Tumor Deposits: Not identified. Regional Lymph Nodes: Number of Lymph Nodes Involved: 0 Number of Lymph Nodes Examined: 18 Pathologic Stage Classification (pTNM, AJCC 8th Edition): pT1, pN0 Ancillary Studies: MSI will be ordered. MMR was performed on the biopsy and showed loss of MLH1 and PMS2.  MSI-high, BRAF V600E mutation present    05/24/2018 Cancer Staging   Staging form: Colon and Rectum, AJCC 8th Edition - Pathologic stage from 05/24/2018: Stage I (pT1, pN0, cM0) - Signed by Tish Men, MD on 06/07/2018     MEDICAL HISTORY:  Past Medical History:  Diagnosis Date  . Arthritis   . Carotid artery stenosis   . Chronic diastolic CHF (congestive heart failure) ([place])   . Family history of skin cancer   . Former smoker    1/2 ppd for 45 years  . 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 Hyperlipidemia   . Hypertension   . Hypothyroidism   . Peripheral vascular disease ([place])   . RBBB   . 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 Severe aortic stenosis     SURGICAL HISTORY: Past Surgical History:  Procedure Laterality Date  . BASAL CELL CARCINOMA EXCISION  2018  . BIOPSY  05/19/2018   Procedure: BIOPSY;  Surgeon: Lavena Bullion, DO;  Location: [place];  Service: Gastroenterology;;  . CARDIAC CATHETERIZATION    . COLONOSCOPY  2001  . COLONOSCOPY WITH PROPOFOL N/A 05/19/2018   Procedure: COLONOSCOPY WITH PROPOFOL;  Surgeon: Lavena Bullion, DO;  Location: [place];  Service: Gastroenterology;  Laterality: N/A;  . ESOPHAGOGASTRODUODENOSCOPY (EGD) WITH PROPOFOL N/A  05/19/2018   Procedure: ESOPHAGOGASTRODUODENOSCOPY (EGD) WITH PROPOFOL;  Surgeon: Lavena Bullion, DO;  Location: [place];  Service: Gastroenterology;  Laterality: N/A;  . HIP SURGERY  2005  . KNEE SURGERY  1998  . LAPAROSCOPIC PARTIAL COLECTOMY N/A 05/24/2018   Procedure: LAPAROSCOPIC ASSISTED ASCENDING HEMICOLECTOMY;  Surgeon: Ileana Roup, MD;  Location: [place];  Service: General;  Laterality: N/A;  . PACEMAKER IMPLANT N/A 03/02/2018   Procedure: PACEMAKER IMPLANT;  Surgeon: Evans Lance, MD;  Location: [place] [facility];  Service: Cardiovascular;  Laterality: N/A;  . RIGHT/LEFT HEART CATH AND CORONARY ANGIOGRAPHY N/A 02/21/2018   Procedure: RIGHT/LEFT HEART CATH AND CORONARY ANGIOGRAPHY;  Surgeon: Sherren Mocha, MD;  Location: [place] [facility];  Service: Cardiovascular;  Laterality: N/A;  . SUBMUCOSAL TATTOO INJECTION  05/19/2018   Procedure: SUBMUCOSAL TATTOO INJECTION;  Surgeon: Lavena Bullion, DO;  Location: [place];  Service: Gastroenterology;;  . TEE WITHOUT CARDIOVERSION N/A 02/28/2018   Procedure: TRANSESOPHAGEAL ECHOCARDIOGRAM (TEE);  Surgeon: Sherren Mocha, MD;  Location: [place] [facility];  Service: Open Heart Surgery;  Laterality: N/A;  . TEMPORARY PACEMAKER N/A 03/01/2018   Procedure: TEMPORARY PACEMAKER;  Surgeon: Burnell Blanks, MD;  Location: [place] [facility];  Service: Cardiovascular;  Laterality: N/A;  . TONSILLECTOMY    .  TRANSCATHETER AORTIC VALVE REPLACEMENT, TRANSFEMORAL N/A 02/28/2018   Procedure: TRANSCATHETER AORTIC VALVE REPLACEMENT, TRANSFEMORAL;  Surgeon: Sherren Mocha, MD;  Location: [place] [facility];  Service: Open Heart Surgery;  Laterality: N/A;    SOCIAL HISTORY: Social History   Socioeconomic History  . Marital status: Married    Spouse name: Not on file  . Number of children: Not on file  . Years of education: Not on file  . Highest education level: Not on file  Occupational History  . Not on file  Social  Needs  . Financial resource strain: Not on file  . Food insecurity    Worry: Not on file    Inability: Not on file  . Transportation needs    Medical: Not on file    Non-medical: Not on file  Tobacco Use  . Smoking status: Former Smoker    Packs/day: 1.00    Years: 45.00    Pack years: 45.00    Types: Cigarettes    Quit date: 05/23/2009    Years since quitting: 9.4  . Smokeless tobacco: Never Used  Substance and Sexual Activity  . Alcohol use: Not Currently  . Drug use: Never  . Sexual activity: Not on file  Lifestyle  . Physical activity    Days per week: Not on file    Minutes per session: Not on file  . Stress: Not on file  Relationships  . Social Herbalist on phone: Not on file    Gets together: Not on file    Attends religious service: Not on file    Active member of club or organization: Not on file    Attends meetings of clubs or organizations: Not on file    Relationship status: Not on file  . Intimate partner violence    Fear of current or ex partner: Not on file    Emotionally abused: Not on file    Physically abused: Not on file    Forced sexual activity: Not on file  Other Topics Concern  . Not on file  Social History Narrative  . Not on file    FAMILY HISTORY: Family History  Problem Relation Age of Onset  . Heart failure Mother   . Chronic Renal Failure Mother   . Alcohol abuse Father   . Skin cancer Sister 55  . Skin cancer Sister 29    ALLERGIES:  is allergic to adhesive [tape].  MEDICATIONS:  Current Outpatient Medications  Medication Sig Dispense Refill  . amoxicillin (AMOXIL) 500 MG tablet Please take 2,000 mg (4 tablets) one hour prior to all dental procedures. 8 tablet 8  . apixaban (ELIQUIS) 5 MG TABS tablet Take 1 tablet (5 mg total) by mouth 2 (two) times daily. 60 tablet 0  . aspirin EC 81 MG EC tablet Take 1 tablet (81 mg total) by mouth daily.    . Calcium Carb-Cholecalciferol (CALTRATE 600+D3 PO) Take 1 tablet by  mouth 2 (two) times daily.    . diphenhydrAMINE (BENADRYL) 25 MG tablet Take 25 mg by mouth every 6 (six) hours as needed for itching or allergies.     . ferrous sulfate 325 (65 FE) MG tablet Take 1 tablet (325 mg total) by mouth every other day. 15 tablet 0  . furosemide (LASIX) 20 MG tablet Take 1 tablet (20 mg total) by mouth daily as needed for fluid or edema.    [name] levothyroxine (SYNTHROID) 100 MCG tablet Take 100 mcg by mouth daily before breakfast.     .  Melatonin 5 MG CAPS Take 5 mg by mouth at bedtime as needed (sleep).     . metoprolol tartrate (LOPRESSOR) 50 MG tablet Take 50 mg by mouth 2 (two) times daily.    . Multiple Vitamins-Minerals (MULTIVITAMIN ADULTS 50+) TABS Take 1 tablet by mouth daily.    . pantoprazole (PROTONIX) 40 MG tablet Take 1 tablet (40 mg total) by mouth daily. 30 tablet 0  . saccharomyces boulardii (FLORASTOR) 250 MG capsule Take 250 mg by mouth 2 (two) times daily.    . simvastatin (ZOCOR) 40 MG tablet Take 1 tablet (40 mg total) by mouth at bedtime. 90 tablet 3   No current facility-administered medications for this visit.     PHYSICAL EXAMINATION: ECOG PERFORMANCE STATUS: 2 - Symptomatic, <50% confined to bed  (The following exam findings are based on observation only due to virtual telemedicine visit)  GENERAL: alert, no distress and comfortable RESPIRATORY: normal breathing effort PSYCH: alert, fluent speech  LABORATORY DATA:  I have reviewed the data as listed Lab Results  Component Value Date   WBC 8.0 11/02/2018   HGB 11.5 (L) 11/02/2018   HCT 37.0 11/02/2018   MCV 91.6 11/02/2018   PLT 233 11/02/2018   Lab Results  Component Value Date   NA 140 11/02/2018   K 3.3 (L) 11/02/2018   CL 112 (H) 11/02/2018   CO2 21 (L) 11/02/2018    RADIOGRAPHIC STUDIES: I have personally reviewed the radiological images as listed and agreed with the findings in the report. Ct Angio Head W Or Wo Contrast  Result Date: 10/31/2018 CLINICAL DATA:   Dizziness and intermittent blurred vision. EXAM: CT ANGIOGRAPHY HEAD AND NECK TECHNIQUE: Multidetector CT imaging of the head and neck was performed using the standard protocol during bolus administration of intravenous contrast. Multiplanar CT image reconstructions and MIPs were obtained to evaluate the vascular anatomy. Carotid stenosis measurements (when applicable) are obtained utilizing NASCET criteria, using the distal internal carotid diameter as the denominator. CONTRAST:  56m OMNIPAQUE IOHEXOL 350 MG/ML SOLN COMPARISON:  Head CT same day FINDINGS: CTA NECK FINDINGS Aortic arch: Aortic atherosclerosis. No aneurysm or dissection. Branching pattern is normal. 50% stenosis of the left subclavian artery origin. Innominate and left common carotid origin widely patent. Right carotid system: Common carotid artery widely patent to the bifurcation. Calcified plaque at the carotid bifurcation and ICA bulb. Minimal diameter in the ICA bulb measures 3.5 mm. Compared to a more distal cervical ICA diameter of 5 mm, this indicates a 30% stenosis. The distal ICA is tortuous but widely patent. Left carotid system: Common carotid artery is tortuous but widely patent to the bifurcation region. There is calcified plaque at the bifurcation and in the ICA bulb. Detail is limited secondary to artifact from jewelry that the patient refused to remove. At the distal ICA bulb, minimal diameter measures 1 mm. Compared to a more distal cervical ICA diameter of 5 mm, this indicates an 80% stenosis. Vertebral arteries: Left vertebral artery shows calcified plaque at its origin with stenosis of 30%. Beyond that, the vessel is widely patent through the cervical region. The right vertebral artery is occluded at its origin. There is reconstituted flow in the upper cervical region. Skeleton: Degenerative spondylosis. Other neck: Lipoma in the lower intrinsic neck musculature on the right. Otherwise no soft tissue neck finding. Upper chest: No  active upper chest process evident. Review of the MIP images confirms the above findings CTA HEAD FINDINGS Anterior circulation: Both internal carotid arteries are patent  through the skull base and siphon regions. There is extensive atherosclerotic calcification in the carotid siphon regions and supraclinoid internal carotid arteries. Stenosis is estimated at 50% in the supraclinoid regions. The anterior and middle cerebral vessels are patent without proximal stenosis, aneurysm or vascular malformation. No large or medium vessel occlusion is identified. Posterior circulation: Left vertebral artery is patent through the foramen magnum to the basilar. No basilar stenosis. Superior cerebellar and posterior cerebral arteries are patent. There are patent bilateral posterior communicating arteries. As noted above, right vertebral artery is a tiny vessel with reconstituted flow that appears to give some contribution the basilar. Flow does appear to be present in right PICA presently. Venous sinuses: Patent and normal. Anatomic variants: None other significant. Review of the MIP images confirms the above findings IMPRESSION: Atherosclerotic disease at both carotid bifurcations. 30% stenosis of the proximal ICA on the right. 80% stenosis in the ICA bulb on the left. Atherosclerotic disease in both carotid siphon regions and supraclinoid internal carotid arteries. Maximal stenosis 50% on both sides. Beyond that, no intracranial large or medium vessel occlusion or correctable proximal stenosis is seen. 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 30% stenosis of the left vertebral artery origin. Reconstituted flow in a tiny distal right vertebral artery at the skull base level gives minimal contribution to the basilar. No basilar stenosis or posterior circulation branch vessel occlusion. Electronically Signed   By: Nelson Chimes M.D.   On: 10/31/2018 17:25   Ct Head Wo Contrast  Result Date: 11/01/2018 CLINICAL DATA:   85 year old female with head trauma and ataxia. EXAM: CT HEAD WITHOUT CONTRAST TECHNIQUE: Contiguous axial images were obtained from the base of the skull through the vertex without intravenous contrast. COMPARISON:  Head CT dated 10/31/2018 FINDINGS: Brain: Mild age-related atrophy and moderate chronic microvascular ischemic changes. Large areas of old infarct and encephalomalacia noted in the cerebellar hemispheres. There is no acute intracranial hemorrhage. No mass effect or midline shift. No extra-axial fluid collection. Vascular: No hyperdense vessel or unexpected calcification. Skull: Normal. Negative for fracture or focal lesion. Sinuses/Orbits: No acute finding. Other: None IMPRESSION: 1. No acute intracranial hemorrhage. 2. Age-related atrophy and chronic microvascular ischemic changes. Large areas of old infarct and encephalomalacia in the cerebellar hemispheres. Electronically Signed   By: Anner Crete M.D.   On: 11/01/2018 12:40   Ct Head Wo Contrast  Result Date: 10/31/2018 CLINICAL DATA:  Dizziness EXAM: CT HEAD WITHOUT CONTRAST TECHNIQUE: Contiguous axial images were obtained from the base of the skull through the vertex without intravenous contrast. COMPARISON:  10/14/2017 FINDINGS: Brain: Large old bilateral cerebellar infarcts, stable. There is atrophy and chronic small vessel disease changes. No acute intracranial abnormality. Specifically, no hemorrhage, hydrocephalus, mass lesion, acute infarction, or significant intracranial injury. Vascular: No hyperdense vessel or unexpected calcification. Skull: No acute calvarial abnormality. Sinuses/Orbits: Visualized paranasal sinuses and mastoids clear. Orbital soft tissues unremarkable. Other: None IMPRESSION: Large old bilateral cerebellar infarcts and encephalomalacia. Atrophy, chronic microvascular disease. No acute intracranial abnormality. Electronically Signed   By: Rolm Baptise M.D.   On: 10/31/2018 11:20   Ct Angio Neck W And/or Wo  Contrast  Result Date: 10/31/2018 CLINICAL DATA:  Dizziness and intermittent blurred vision. EXAM: CT ANGIOGRAPHY HEAD AND NECK TECHNIQUE: Multidetector CT imaging of the head and neck was performed using the standard protocol during bolus administration of intravenous contrast. Multiplanar CT image reconstructions and MIPs were obtained to evaluate the vascular anatomy. Carotid stenosis measurements (when applicable) are obtained utilizing NASCET  criteria, using the distal internal carotid diameter as the denominator. CONTRAST:  79m OMNIPAQUE IOHEXOL 350 MG/ML SOLN COMPARISON:  Head CT same day FINDINGS: CTA NECK FINDINGS Aortic arch: Aortic atherosclerosis. No aneurysm or dissection. Branching pattern is normal. 50% stenosis of the left subclavian artery origin. Innominate and left common carotid origin widely patent. Right carotid system: Common carotid artery widely patent to the bifurcation. Calcified plaque at the carotid bifurcation and ICA bulb. Minimal diameter in the ICA bulb measures 3.5 mm. Compared to a more distal cervical ICA diameter of 5 mm, this indicates a 30% stenosis. The distal ICA is tortuous but widely patent. Left carotid system: Common carotid artery is tortuous but widely patent to the bifurcation region. There is calcified plaque at the bifurcation and in the ICA bulb. Detail is limited secondary to artifact from jewelry that the patient refused to remove. At the distal ICA bulb, minimal diameter measures 1 mm. Compared to a more distal cervical ICA diameter of 5 mm, this indicates an 80% stenosis. Vertebral arteries: Left vertebral artery shows calcified plaque at its origin with stenosis of 30%. Beyond that, the vessel is widely patent through the cervical region. The right vertebral artery is occluded at its origin. There is reconstituted flow in the upper cervical region. Skeleton: Degenerative spondylosis. Other neck: Lipoma in the lower intrinsic neck musculature on the right.  Otherwise no soft tissue neck finding. Upper chest: No active upper chest process evident. Review of the MIP images confirms the above findings CTA HEAD FINDINGS Anterior circulation: Both internal carotid arteries are patent through the skull base and siphon regions. There is extensive atherosclerotic calcification in the carotid siphon regions and supraclinoid internal carotid arteries. Stenosis is estimated at 50% in the supraclinoid regions. The anterior and middle cerebral vessels are patent without proximal stenosis, aneurysm or vascular malformation. No large or medium vessel occlusion is identified. Posterior circulation: Left vertebral artery is patent through the foramen magnum to the basilar. No basilar stenosis. Superior cerebellar and posterior cerebral arteries are patent. There are patent bilateral posterior communicating arteries. As noted above, right vertebral artery is a tiny vessel with reconstituted flow that appears to give some contribution the basilar. Flow does appear to be present in right PICA presently. Venous sinuses: Patent and normal. Anatomic variants: None other significant. Review of the MIP images confirms the above findings IMPRESSION: Atherosclerotic disease at both carotid bifurcations. 30% stenosis of the proximal ICA on the right. 80% stenosis in the ICA bulb on the left. Atherosclerotic disease in both carotid siphon regions and supraclinoid internal carotid arteries. Maximal stenosis 50% on both sides. Beyond that, no intracranial large or medium vessel occlusion or correctable proximal stenosis is seen. 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 30% stenosis of the left vertebral artery origin. Reconstituted flow in a tiny distal right vertebral artery at the skull base level gives minimal contribution to the basilar. No basilar stenosis or posterior circulation branch vessel occlusion. Electronically Signed   By: MNelson ChimesM.D.   On: 10/31/2018 17:25   Dg Chest  Port 1 View  Result Date: 10/31/2018 CLINICAL DATA:  Pt having weakness, dizziness for 2 weeks, syncope yesterday - hx of CHF, htn, CVA, TAVR, exsmoker, RBBBcough, weakness EXAM: PORTABLE CHEST 1 VIEW COMPARISON:  03/03/2018 FINDINGS: LEFT-sided pacemaker overlies stable cardiac silhouette. Lungs are mildly hyperinflated. No effusion, infiltrate pneumothorax. No acute osseous abnormality. IMPRESSION: No acute cardiopulmonary process. Electronically Signed   By: SSuzy BouchardM.D.   On:  10/31/2018 12:47   Vas US Carotid  Result Date: 11/02/2018 Carotid Arterial Duplex Study Indications:       Stenosis noted by CTA. Risk Factors:      Hyperlipidemia, prior CVA. Limitations        Today's exam was limited due to heavy calcification and the                    resulting shadowing, the patient's respiratory variation,                    patient movement and the high bifurcation of the carotid. Comparison Study:  10/31/2018 - CTA neck showed 30% right ICA stenosis and 80%                    left ICA stenosis. Performing Technologist: Oliver Hum RVT  Examination Guidelines: A complete evaluation includes B-mode imaging, spectral Doppler, color Doppler, and power Doppler as needed of all accessible portions of each vessel. Bilateral testing is considered an integral part of a complete examination. Limited examinations for reoccurring indications may be performed as noted.  Right Carotid Findings: +----------+-------+-------+--------+---------------------------------+--------+           PSV    EDV    StenosisPlaque Description               Comments           cm/s   cm/s                                                     +----------+-------+-------+--------+---------------------------------+--------+ CCA Prox  71     7              smooth and heterogenous                   +----------+-------+-------+--------+---------------------------------+--------+ CCA Distal52     9               smooth and heterogenous                   +----------+-------+-------+--------+---------------------------------+--------+ ICA Prox  53     11             irregular, heterogenous and                                               calcific                                  +----------+-------+-------+--------+---------------------------------+--------+ ICA Distal64     15                                              tortuous +----------+-------+-------+--------+---------------------------------+--------+ ECA       157    0                                                        +----------+-------+-------+--------+---------------------------------+--------+ +----------+--------+-------+--------+-------------------+  PSV cm/sEDV cmsDescribeArm Pressure (mmHG) +----------+--------+-------+--------+-------------------+ Subclavian129                                        +----------+--------+-------+--------+-------------------+ +---------+--------+--+--------+-+---------+ VertebralPSV cm/s28EDV cm/s4Antegrade +---------+--------+--+--------+-+---------+  Left Carotid Findings: +----------+-------+-------+--------+---------------------------------+--------+           PSV    EDV    StenosisPlaque Description               Comments           cm/s   cm/s                                                     +----------+-------+-------+--------+---------------------------------+--------+ CCA Prox  60     9              smooth and heterogenous          tortuous +----------+-------+-------+--------+---------------------------------+--------+ CCA Distal50     7              irregular, heterogenous and                                               calcific                                  +----------+-------+-------+--------+---------------------------------+--------+ ICA Prox  68     13             calcific and heterogenous                  +----------+-------+-------+--------+---------------------------------+--------+ ICA Distal75     16                                              tortuous +----------+-------+-------+--------+---------------------------------+--------+ ECA       97     0                                                        +----------+-------+-------+--------+---------------------------------+--------+ +----------+--------+--------+--------+-------------------+           PSV cm/sEDV cm/sDescribeArm Pressure (mmHG) +----------+--------+--------+--------+-------------------+ [id number]                                         +----------+--------+--------+--------+-------------------+ +---------+--------+--+--------+-+---------+ VertebralPSV cm/s43EDV cm/s6Antegrade +---------+--------+--+--------+-+---------+  Summary: Right Carotid: Velocities in the right ICA are consistent with a 1-39% stenosis. Left Carotid: Velocities in the left ICA are consistent with a 1-39% stenosis.               The proximal ICA is heavily calcified. Likely more stenotic within               the area of calcification. Vertebrals: Bilateral  vertebral arteries demonstrate antegrade flow. *See table(s) above for measurements and observations.  Electronically signed by Deitra Mayo MD on 11/02/2018 at 5:10:23 PM.    Final     PATHOLOGY: I have reviewed the pathology reports as documented in the oncologist history.

## 2018-11-16 DIAGNOSIS — I951 Orthostatic hypotension: Secondary | ICD-10-CM | POA: Diagnosis not present

## 2018-11-16 DIAGNOSIS — Z09 Encounter for follow-up examination after completed treatment for conditions other than malignant neoplasm: Secondary | ICD-10-CM | POA: Diagnosis not present

## 2018-11-16 DIAGNOSIS — Z760 Encounter for issue of repeat prescription: Secondary | ICD-10-CM | POA: Diagnosis not present

## 2018-11-16 DIAGNOSIS — W108XXD Fall (on) (from) other stairs and steps, subsequent encounter: Secondary | ICD-10-CM | POA: Diagnosis not present

## 2018-11-17 NOTE — Progress Notes (Signed)
A recall for colonoscopy has been placed for 05/2019,anda reminder sent to self to schedule a follow up appointment for January

## 2018-11-17 NOTE — Progress Notes (Signed)
Patient has been placed on the recall list for 05/2019 for colonoscopy. A reminder has been placed to call patient to schedule appointment for January

## 2018-11-20 ENCOUNTER — Ambulatory Visit (INDEPENDENT_AMBULATORY_CARE_PROVIDER_SITE_OTHER): Payer: Medicare Other | Admitting: Cardiology

## 2018-11-20 ENCOUNTER — Encounter: Payer: Self-pay | Admitting: Cardiology

## 2018-11-20 ENCOUNTER — Other Ambulatory Visit: Payer: Self-pay

## 2018-11-20 VITALS — BP 134/62 | HR 63 | Ht 62.0 in | Wt 172.0 lb

## 2018-11-20 DIAGNOSIS — E7800 Pure hypercholesterolemia, unspecified: Secondary | ICD-10-CM

## 2018-11-20 DIAGNOSIS — N63 Unspecified lump in unspecified breast: Secondary | ICD-10-CM | POA: Diagnosis not present

## 2018-11-20 DIAGNOSIS — I350 Nonrheumatic aortic (valve) stenosis: Secondary | ICD-10-CM

## 2018-11-20 DIAGNOSIS — C189 Malignant neoplasm of colon, unspecified: Secondary | ICD-10-CM | POA: Diagnosis not present

## 2018-11-20 DIAGNOSIS — I6523 Occlusion and stenosis of bilateral carotid arteries: Secondary | ICD-10-CM

## 2018-11-20 DIAGNOSIS — I442 Atrioventricular block, complete: Secondary | ICD-10-CM | POA: Diagnosis not present

## 2018-11-20 DIAGNOSIS — I050 Rheumatic mitral stenosis: Secondary | ICD-10-CM | POA: Diagnosis not present

## 2018-11-20 DIAGNOSIS — I10 Essential (primary) hypertension: Secondary | ICD-10-CM

## 2018-11-20 DIAGNOSIS — I5032 Chronic diastolic (congestive) heart failure: Secondary | ICD-10-CM

## 2018-11-20 DIAGNOSIS — I480 Paroxysmal atrial fibrillation: Secondary | ICD-10-CM | POA: Diagnosis not present

## 2018-11-20 NOTE — Patient Instructions (Signed)
Medication Instructions:   STOP TAKING ASPIRIN NOW  *If you need a refill on your cardiac medications before your next appointment, please call your pharmacy*   Lab Work:  PLEASE PRESENT YOUR LAB PRESCRIPTION TO [facility] FOLLOWING LABS:  CMET, CBC, AND FASTING LIPID PANEL.  PLEASE HAVE THEM FAX THE RESULTS TO OUR OFFICE ATTENTION TO :  DR. Radford Pax AT [phone]  If you have labs (blood work) drawn today and your tests are completely normal, you will receive your results only by: [name] MyChart Message (if you have MyChart) OR . A paper copy in the mail If you have any lab test that is abnormal or we need to change your treatment, we will call you to review the results.    Follow-Up: At [facility], you and your health needs are our priority.  As part of our continuing mission to provide you with exceptional heart care, we have created designated Provider Care Teams.  These Care Teams include your primary Cardiologist (physician) and Advanced Practice Providers (APPs -  Physician Assistants and Nurse Practitioners) who all work together to provide you with the care you need, when you need it.  Your next appointment:   6 months  The format for your next appointment:   Either In Person or Virtual  Provider:   Fransico Him, MD

## 2018-11-20 NOTE — Progress Notes (Signed)
Cardiology Office Note:    Date:  11/20/2018   ID:  Julie Mcgee, DOB Oct 09, 1933, MRN [id number]  PCP:  Lyman Bishop, DO  Cardiologist:  Jenean Lindau, MD    Referring MD: Lyman Bishop, DO   Chief Complaint  Patient presents with  . Aortic Stenosis  . Hypertension  . Congestive Heart Failure  . Atrial Fibrillation    History of Present Illness:    Julie Mcgee is a 85 y.o. female with a hx of  severe AS s/p 17m Edwards Sapien 3 THV TAVR 02/2018, RBBB, carotid artery stenosis, Hypertension, hyperlipidemia, PAF noted on Pacer check, complete heart block s/p PPM, chronic diastolic CHF.  She is followed in device clinic for her PPM.  Unfortunately she was diagnosed with colon cancer in her ascending colon after stenting with a GI bleed.  She had been on DAPT for her recent TAVR and that was held.  She had hemicolectomy on 05/24/2018 for invasive adeno CA.  Prior to discharge her hemoglobin was stable at 7.1 and she was placed back on DAPT therapy.  Prior to discharge she was also noted to have a mass in her right breast by CT that was suspicious for cancer and this is going to be worked up as an outpatient.  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  They reviewed her staging scans which showed a 2.6 cm lobulated mass in the right central breast with enlarged mediastinal and hilar lymph nodes.  It was felt that this was likely a primary breast cancer and not metastasis.  Patient was not ready to consider further work-up at this time.  Her follow hemoglobin was 9.3 on 06/07/2018.  She is here today for followup.  She denies any chest pain or pressure, SOB, DOE, PND, orthopnea, LE edema,  palpitations. She was recently in the hospital after a syncopal episode felt related to dehydration.  She is compliant with her meds and is tolerating meds with no SE.    Past Medical History:   Diagnosis Date  . Arthritis   . Carotid artery stenosis   . Chronic diastolic CHF (congestive heart failure) ([place]   . Family history of skin cancer   . Former smoker    1/2 ppd for 45 years  . 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 Hyperlipidemia   . Hypertension   . Hypothyroidism   . PAF (paroxysmal atrial fibrillation) ([facility] [place]   . Peripheral vascular disease ([place]   . RBBB   . 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 Severe aortic stenosis     Past Surgical History:  Procedure Laterality Date  . BASAL CELL CARCINOMA EXCISION  2018  . BIOPSY  05/19/2018   Procedure: BIOPSY;  Surgeon: CLavena Bullion DO;  [place]  Service: Gastroenterology;;  . CARDIAC CATHETERIZATION    . COLONOSCOPY  2001  . COLONOSCOPY WITH PROPOFOL N/A 05/19/2018   Procedure: COLONOSCOPY WITH PROPOFOL;  Surgeon: CLavena Bullion DO;  [place] [facility]  Service: Gastroenterology;  Laterality: N/A;  . ESOPHAGOGASTRODUODENOSCOPY (EGD) WITH PROPOFOL N/A 05/19/2018   Procedure: ESOPHAGOGASTRODUODENOSCOPY (EGD) WITH PROPOFOL;  Surgeon: CLavena Bullion DO;  [place]  Service: Gastroenterology;  Laterality: N/A;  .  HIP SURGERY  2005  . KNEE SURGERY  1998  . LAPAROSCOPIC PARTIAL COLECTOMY N/A 05/24/2018   Procedure: LAPAROSCOPIC ASSISTED ASCENDING HEMICOLECTOMY;  Surgeon: Ileana Roup, MD;  Location: [place];  Service: General;  Laterality: N/A;  . PACEMAKER IMPLANT N/A 03/02/2018   Procedure: PACEMAKER IMPLANT;  Surgeon: Evans Lance, MD;  Location: [place] [facility];  Service: Cardiovascular;  Laterality: N/A;  . RIGHT/LEFT HEART CATH AND CORONARY ANGIOGRAPHY N/A 02/21/2018   Procedure: RIGHT/LEFT HEART CATH AND CORONARY ANGIOGRAPHY;  Surgeon: Sherren Mocha, MD;  Location: [place] [facility];   Service: Cardiovascular;  Laterality: N/A;  . SUBMUCOSAL TATTOO INJECTION  05/19/2018   Procedure: SUBMUCOSAL TATTOO INJECTION;  Surgeon: Lavena Bullion, DO;  Location: [place];  Service: Gastroenterology;;  . TEE WITHOUT CARDIOVERSION N/A 02/28/2018   Procedure: TRANSESOPHAGEAL ECHOCARDIOGRAM (TEE);  Surgeon: Sherren Mocha, MD;  Location: [place] [facility];  Service: Open Heart Surgery;  Laterality: N/A;  . TEMPORARY PACEMAKER N/A 03/01/2018   Procedure: TEMPORARY PACEMAKER;  Surgeon: Burnell Blanks, MD;  Location: [place] [facility];  Service: Cardiovascular;  Laterality: N/A;  . TONSILLECTOMY    . TRANSCATHETER AORTIC VALVE REPLACEMENT, TRANSFEMORAL N/A 02/28/2018   Procedure: TRANSCATHETER AORTIC VALVE REPLACEMENT, TRANSFEMORAL;  Surgeon: Sherren Mocha, MD;  Location: [place] [facility];  Service: Open Heart Surgery;  Laterality: N/A;    Current Medications: Current Meds  Medication Sig  . amoxicillin (AMOXIL) 500 MG tablet Please take 2,000 mg (4 tablets) one hour prior to all dental procedures.  [name] apixaban (ELIQUIS) 5 MG TABS tablet Take 1 tablet (5 mg total) by mouth 2 (two) times daily.  . Calcium Carb-Cholecalciferol (CALTRATE 600+D3 PO) Take 1 tablet by mouth 2 (two) times daily.  . ferrous sulfate 325 (65 FE) MG tablet Take 1 tablet (325 mg total) by mouth every other day.  . furosemide (LASIX) 20 MG tablet Take 1 tablet (20 mg total) by mouth daily as needed for fluid or edema.  [name] levothyroxine (SYNTHROID) 100 MCG tablet Take 100 mcg by mouth daily before breakfast.   . metoprolol tartrate (LOPRESSOR) 50 MG tablet Take 50 mg by mouth 2 (two) times daily.  . Multiple Vitamins-Minerals (MULTIVITAMIN ADULTS 50+) TABS Take 1 tablet by mouth daily.  . pantoprazole (PROTONIX) 40 MG tablet Take 1 tablet (40 mg total) by mouth daily.  [name] saccharomyces boulardii (FLORASTOR) 250 MG capsule Take 250 mg by mouth 2 (two) times daily.  . [DISCONTINUED] aspirin EC 81 MG EC  tablet Take 1 tablet (81 mg total) by mouth daily.     Allergies:   Adhesive [tape]   Social History   Socioeconomic History  . Marital status: Married    Spouse name: Not on file  . Number of children: Not on file  . Years of education: Not on file  . Highest education level: Not on file  Occupational History  . Not on file  Social Needs  . Financial resource strain: Not on file  . Food insecurity    Worry: Not on file    Inability: Not on file  . Transportation needs    Medical: Not on file    Non-medical: Not on file  Tobacco Use  . Smoking status: Former Smoker    Packs/day: 1.00    Years: 45.00    Pack years: 45.00    Types: Cigarettes    Quit date: 05/23/2009    Years since quitting: 9.5  . Smokeless tobacco: Never Used  Substance and Sexual Activity  . Alcohol use: Not Currently  . Drug use: Never  . Sexual activity: Not on file  Lifestyle  . Physical activity    Days per week: Not on file    Minutes per session: Not on file  . Stress: Not on file  Relationships  . Social Herbalist on phone: Not on file    Gets together: Not on file    Attends religious service: Not on file    Active member of club or organization: Not on file    Attends meetings of clubs or organizations: Not on file    Relationship status: Not on file  Other Topics Concern  . Not on file  Social History Narrative  . Not on file     Family History: The patient's family history includes Alcohol abuse in her father; Chronic Renal Failure in her mother; Heart failure in her mother; Skin cancer (age of onset: 24) in her sister; Skin cancer (age of onset: 63) in her sister.  ROS:   Please see the history of present illness.    ROS  All other systems reviewed and negative.   EKGs/Labs/Other Studies Reviewed:    The following studies were reviewed today: none  EKG:  EKG is  ordered today.    Recent Labs: 02/27/2018: B Natriuretic Peptide 1,020.2 11/01/2018: ALT 13;  Magnesium 1.9; TSH 3.742 11/02/2018: BUN 16; Creatinine, Ser 0.95; Hemoglobin 11.5; Platelets 233; Potassium 3.3; Sodium 140   Recent Lipid Panel No results found for: CHOL, TRIG, HDL, CHOLHDL, VLDL, LDLCALC, LDLDIRECT  Physical Exam:    VS:  BP 134/62   Pulse 63   Ht '5\' 2"'  (1.575 m)   Wt 172 lb (78 kg) Comment: Pt stated weight  SpO2 99%   BMI 31.46 kg/m     Wt Readings from Last 3 Encounters:  11/20/18 172 lb (78 kg)  11/01/18 166 lb (75.3 kg)  06/20/18 181 lb (82.1 kg)     GEN:  Well nourished, well developed in no acute distress HEENT: Normal NECK: No JVD; No carotid bruits LYMPHATICS: No lymphadenopathy CARDIAC: RRR, no murmurs, rubs, gallops RESPIRATORY:  Clear to auscultation without rales, wheezing or rhonchi  ABDOMEN: Soft, non-tender, non-distended MUSCULOSKELETAL:  No edema; No deformity  SKIN: Warm and dry NEUROLOGIC:  Alert and oriented x 3 PSYCHIATRIC:  Normal affect   ASSESSMENT:    1. Severe aortic stenosis   2. Essential hypertension   3. Pure hypercholesterolemia   4. Bilateral carotid artery stenosis   5. Chronic diastolic CHF (congestive heart failure) ([place])   6. Malignant neoplasm of colon, unspecified part of colon ([place])   7. Breast mass   8. Heart block AV complete ([place])   9. Rheumatic mitral stenosis   10. PAF (paroxysmal atrial fibrillation) ([facility])    PLAN:    In order of problems listed above:  1.  Severe AS  - s/p 19m Edwards Sapien 3 THV TAVR 02/2018.   -stop ASA since she is on DOAC -Follow-up echo 05/18/2018 3 months past TAVR showed normal LV function with a stable TAVR with  mean aortic valve gradient 19 mmHg.  2.  Hypertension  -BP controlled -continue Lopressor 50 mg twice daily.  3.  Hyperlipidemia  -her LDL goal is less than 70 due to carotid stenosis.  -LDL was 136 in January -repeat FLP and ALT  4.  Bilateral carotid artery stenosis  -Carotid Dopplers 02/21/2018 showed 1 to 39% stenosis of  the carotid arteries  bilaterally.   -Continue on statin. -no ASA due to DOAC  5.  Chronic diastolic CHF  -she has not had any significant lower extremity edema or shortness of breath.   -Her weight is stable. -continue on diuretics PRN  6.  AdenoCA of the colon  -status post hemicolectomy for stage I colon adeno CA. -followed by oncology  7.  Breast mass  - felt to likely be a primary cancer but at this time patient not ready to follow-up with oncology for work-up.  8.  Complete heart block -s/p PPM  -followed in device clinic  9.  Severe Rheumatic mitral stenosis -noted on echo 09/2018 -no sx of LE edema, SOB  -she had an episode of syncope but felt related to dehydration -not a surgical candidate for structural heart team -I encouraged her and her son to let me know if she develops any SOB, LE edema or recurrent syncope  10.  PAF -noted on pacer interrogation -asymptomatic -continue BB and Eliquis -check BMET and CBC   Medication Adjustments/Labs and Tests Ordered: Current medicines are reviewed at length with the patient today.  Concerns regarding medicines are outlined above.  No orders of the defined types were placed in this encounter.  No orders of the defined types were placed in this encounter.   Signed, Fransico Him, MD  11/20/2018 2:52 PM    [place]

## 2018-11-22 DIAGNOSIS — I480 Paroxysmal atrial fibrillation: Secondary | ICD-10-CM | POA: Diagnosis not present

## 2018-11-22 DIAGNOSIS — E7800 Pure hypercholesterolemia, unspecified: Secondary | ICD-10-CM | POA: Diagnosis not present

## 2018-11-22 DIAGNOSIS — I10 Essential (primary) hypertension: Secondary | ICD-10-CM | POA: Diagnosis not present

## 2018-11-22 DIAGNOSIS — I359 Nonrheumatic aortic valve disorder, unspecified: Secondary | ICD-10-CM | POA: Diagnosis not present

## 2018-11-22 DIAGNOSIS — I050 Rheumatic mitral stenosis: Secondary | ICD-10-CM | POA: Diagnosis not present

## 2018-11-22 DIAGNOSIS — I5032 Chronic diastolic (congestive) heart failure: Secondary | ICD-10-CM | POA: Diagnosis not present

## 2018-11-22 DIAGNOSIS — I442 Atrioventricular block, complete: Secondary | ICD-10-CM | POA: Diagnosis not present

## 2018-11-23 NOTE — Progress Notes (Signed)
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  History supplemented by hospital records.  She is accompanied by her daughter-in-law who supplements history.  On 10/30/2018, she was sitting at the kitchen table when she felt lightheaded and developed bilateral complete vision loss lasting approximately 30 minutes.  She presented to the ED.  Unable to have MRI due to pacemaker but CT of head showed no acute intracranial abnormalities.  CTA of head and neck showed atherosclerotic disease with 50% stenosis of the supraclinoid ICAs, 30% stenosis of proximal right ICA, 80% stenosis in left ICA bulb, right vertebral artery occlusion at its origin with reconstituted flow to tiny right vertebral artery at skull base supplying minimal flow to basilar artery but basilar artery is patent .She was discharged with outpatient follow up with neurology and vascular surgery.  She felt lightheaded whenever she would stand up.  The next morning, she was brushing her teeth when she passed out and fell hitting her head. Spell was preceded by nausea but no headache, lightheadedness, vomiting, diarrhea or visual disturbance.  She was admitted to hospital for further evaluation.  She was found to have orthostatic vitals on exam.  She was evaluated by vascular surgery regarding the right ICA stenosis.  Carotid ultrasound showed 1-39% bilateral ICA stenosis, heavily calcified on the left, so vascular believed  that the 80% was likely an overestimation and not contributing to her symptoms.  Echocardiogram showed EF 60-65%.  Syncope was believed to be secondary to orthostatic hypotension.  Since then, she has been feeling well.  PAST MEDICAL HISTORY: Past Medical History:  Diagnosis Date   Arthritis    Carotid artery stenosis    Chronic diastolic CHF (congestive heart failure) ([place])    Family history of skin cancer    Former smoker    1/2 ppd for 45 years   History of CVA (cerebrovascular accident) 2011   with residual R hemiparesis   Hyperlipidemia    Hypertension    Hypothyroidism    PAF (paroxysmal atrial fibrillation) ([place])    Peripheral vascular disease (HCC)    RBBB    S/P TAVR (transcatheter aortic valve replacement) 02/28/2018   23 mm Edwards Sapien 3 transcatheter heart valve placed via percutaneous right transfemoral approach    Severe aortic stenosis     PAST SURGICAL HISTORY: Past Surgical History:  Procedure Laterality Date   BASAL CELL CARCINOMA EXCISION  2018   BIOPSY  05/19/2018   Procedure: BIOPSY;  Surgeon: Lavena Bullion, DO;  Location: [facility];  Service: Gastroenterology;;   CARDIAC CATHETERIZATION     COLONOSCOPY  2001   COLONOSCOPY WITH PROPOFOL N/A 05/19/2018   Procedure: COLONOSCOPY WITH PROPOFOL;  Surgeon: Lavena Bullion, DO;  [facility]: [facility];  Service: Gastroenterology;  Laterality: N/A;   ESOPHAGOGASTRODUODENOSCOPY (EGD) WITH PROPOFOL N/A 05/19/2018   Procedure: ESOPHAGOGASTRODUODENOSCOPY (EGD) WITH PROPOFOL;  Surgeon: Lavena Bullion, DO;  Location: [place] [facility];  Service: Gastroenterology;  Laterality: N/A;   HIP SURGERY  2005   KNEE SURGERY  1998   LAPAROSCOPIC PARTIAL COLECTOMY N/A  05/24/2018   Procedure: LAPAROSCOPIC ASSISTED ASCENDING HEMICOLECTOMY;  Surgeon: Ileana Roup, MD;  Location: [facility];  Service: General;  Laterality: N/A;   PACEMAKER IMPLANT N/A 03/02/2018   Procedure: PACEMAKER IMPLANT;  Surgeon:  Evans Lance, MD;  Location: [facility];  Service: Cardiovascular;  Laterality: N/A;   RIGHT/LEFT HEART CATH AND CORONARY ANGIOGRAPHY N/A 02/21/2018   Procedure: RIGHT/LEFT HEART CATH AND CORONARY ANGIOGRAPHY;  Surgeon: Sherren Mocha, MD;  Location: [facility];  Service: Cardiovascular;  Laterality: N/A;   SUBMUCOSAL TATTOO INJECTION  05/19/2018   Procedure: SUBMUCOSAL TATTOO INJECTION;  Surgeon: Lavena Bullion, DO;  Location: [facility];  Service: Gastroenterology;;   TEE WITHOUT CARDIOVERSION N/A 02/28/2018   Procedure: TRANSESOPHAGEAL ECHOCARDIOGRAM (TEE);  Surgeon: Sherren Mocha, MD;  Location: [place] [facility];  Service: Open Heart Surgery;  Laterality: N/A;   TEMPORARY PACEMAKER N/A 03/01/2018   Procedure: TEMPORARY PACEMAKER;  Surgeon: Burnell Blanks, MD;  Location: [facility];  Service: Cardiovascular;  Laterality: N/A;   TONSILLECTOMY     TRANSCATHETER AORTIC VALVE REPLACEMENT, TRANSFEMORAL N/A 02/28/2018   Procedure: TRANSCATHETER AORTIC VALVE REPLACEMENT, TRANSFEMORAL;  Surgeon: Sherren Mocha, MD;  Location: [facility] [facility];  Service: Open Heart Surgery;  Laterality: N/A;    MEDICATIONS: Current Outpatient Medications on File Prior to Visit  Medication Sig Dispense Refill   amoxicillin (AMOXIL) 500 MG tablet Please take 2,000 mg (4 tablets) one hour prior to all dental procedures. 8 tablet 8   apixaban (ELIQUIS) 5 MG TABS tablet Take 1 tablet (5 mg total) by mouth 2 (two) times daily. 60 tablet 0   Calcium Carb-Cholecalciferol (CALTRATE 600+D3 PO) Take 1 tablet by mouth 2 (two) times daily.     ferrous sulfate 325 (65 FE) MG tablet Take 1 tablet (325 mg total) by mouth every other day. 15 tablet 0   furosemide (LASIX) 20 MG tablet Take 1 tablet (20 mg total) by mouth daily as needed for fluid or edema.     levothyroxine (SYNTHROID) 100 MCG tablet Take 100 mcg by mouth daily before breakfast.      metoprolol tartrate  (LOPRESSOR) 50 MG tablet Take 50 mg by mouth 2 (two) times daily.     Multiple Vitamins-Minerals (MULTIVITAMIN ADULTS 50+) TABS Take 1 tablet by mouth daily.     pantoprazole (PROTONIX) 40 MG tablet Take 1 tablet (40 mg total) by mouth daily. 30 tablet 0   saccharomyces boulardii (FLORASTOR) 250 MG capsule Take 250 mg by mouth 2 (two) times daily.     simvastatin (ZOCOR) 40 MG tablet Take 1 tablet (40 mg total) by mouth at bedtime. 90 tablet 3   No current facility-administered medications on file prior to visit.     ALLERGIES: Allergies  Allergen Reactions   Adhesive [Tape] Other (See Comments)    Causes BLISTERS    FAMILY HISTORY: Family History  Problem Relation Age of Onset   Heart failure Mother    Chronic Renal Failure Mother    Alcohol abuse Father    Skin cancer Sister 34   Skin cancer Sister 18    SOCIAL HISTORY: Social History   Socioeconomic History   Marital status: Married    Spouse name: Not on file   Number of children: Not on file   Years of education: Not on file   Highest education level: Not on file  Occupational History   Not on file  Social Needs   Financial resource strain: Not on  file   Food insecurity    Worry: Not on file    Inability: Not on file   Transportation needs    Medical: Not on file    Non-medical: Not on file  Tobacco Use   Smoking status: Former Smoker    Packs/day: 1.00    Years: 45.00    Pack years: 45.00    Types: Cigarettes    Quit date: 05/23/2009    Years since quitting: 9.5   Smokeless tobacco: Never Used  Substance and Sexual Activity   Alcohol use: Not Currently   Drug use: Never   Sexual activity: Not on file  Lifestyle   Physical activity    Days per week: Not on file    Minutes per session: Not on file   Stress: Not on file  Relationships   Social connections    Talks on phone: Not on file    Gets together: Not on file    Attends religious service: Not on file    Active  member of club or organization: Not on file    Attends meetings of clubs or organizations: Not on file    Relationship status: Not on file   Intimate partner violence    Fear of current or ex partner: Not on file    Emotionally abused: Not on file    Physically abused: Not on file    Forced sexual activity: Not on file  Other Topics Concern   Not on file  Social History Narrative   Not on file    REVIEW OF SYSTEMS: Constitutional: No fevers, chills, or sweats, no generalized fatigue, change in appetite Eyes: No visual changes, double vision, eye pain Ear, nose and throat: No hearing loss, ear pain, nasal congestion, sore throat Cardiovascular: No chest pain, palpitations Respiratory:  No shortness of breath at rest or with exertion, wheezes GastrointestinaI: No nausea, vomiting, diarrhea, abdominal pain, fecal incontinence Genitourinary:  No dysuria, urinary retention or frequency Musculoskeletal:  No neck pain, back pain Integumentary: No rash, pruritus, skin lesions Neurological: as above Psychiatric: No depression, insomnia, anxiety Endocrine: No palpitations, fatigue, diaphoresis, mood swings, change in appetite, change in weight, increased thirst Hematologic/Lymphatic:  No purpura, petechiae. Allergic/Immunologic: no itchy/runny eyes, nasal congestion, recent allergic reactions, rashes  PHYSICAL EXAM: Blood pressure (!) 184/79, pulse 62, weight 174 lb (78.9 kg), SpO2 97 %. General: No acute distress.  Patient appears well-groomed.   Head:  Normocephalic/atraumatic Eyes:  fundi examined but not visualized Neck: supple, no paraspinal tenderness, full range of motion Back: No paraspinal tenderness Heart: regular rate and rhythm Lungs: Clear to auscultation bilaterally. Vascular: No carotid bruits. Neurological Exam: Mental status: alert and oriented to person, place, and time, recent and remote memory intact, fund of knowledge intact, attention and concentration intact,  speech fluent and mildly dysarthric, language intact. Cranial nerves: CN I: not tested CN II: pupils equal, round and reactive to light, visual fields intact CN III, IV, VI:  full range of motion, no nystagmus, no ptosis CN V: facial sensation intact CN VII: upper and lower face symmetric CN VIII: hearing intact CN IX, X: gag intact, uvula midline CN XI: sternocleidomastoid and trapezius muscles intact CN XII: tongue midline Bulk & Tone: normal, no fasciculations. Motor:  5-/5 right proximal upper extremity, otherwise 5/5 throughout Sensation:  temperature and vibration sensation intact. Deep Tendon Reflexes:  2+ throughout, toes downgoing.   Finger to nose testing:  Slowed on the right. Gait:  Cautious wide-based gait.  Uses walker.  Romberg negative.  IMPRESSION: 1.  Syncope secondary to orthostatic hypotension likely secondary to dehydration 2.  Right hemiparesis and dysarthria as late effect of CVA 3.  Hypertension  PLAN: 1.  Keep adequately hydrated as per cardiology 2.  Secondary stroke prevention 3.  Follow up with PCP regarding blood pressure 4.  Follow up as needed.  Thank you for allowing me to take part in the care of this patient.  Metta Clines, DO  CC:  Crissie Sickles, DO

## 2018-11-24 ENCOUNTER — Other Ambulatory Visit: Payer: Self-pay

## 2018-11-24 ENCOUNTER — Encounter: Payer: Self-pay | Admitting: Neurology

## 2018-11-24 ENCOUNTER — Ambulatory Visit (INDEPENDENT_AMBULATORY_CARE_PROVIDER_SITE_OTHER): Payer: Medicare Other | Admitting: Neurology

## 2018-11-24 VITALS — BP 184/79 | HR 62 | Wt 174.0 lb

## 2018-11-24 DIAGNOSIS — I951 Orthostatic hypotension: Secondary | ICD-10-CM

## 2018-11-24 DIAGNOSIS — I69351 Hemiplegia and hemiparesis following cerebral infarction affecting right dominant side: Secondary | ICD-10-CM

## 2018-11-24 DIAGNOSIS — I10 Essential (primary) hypertension: Secondary | ICD-10-CM

## 2018-11-24 DIAGNOSIS — I6523 Occlusion and stenosis of bilateral carotid arteries: Secondary | ICD-10-CM

## 2018-11-24 NOTE — Patient Instructions (Signed)
I agree that the passing out was due to dehydration Your blood pressure is high.  Follow up with your PCP Follow up with me as needed

## 2018-11-28 ENCOUNTER — Telehealth: Payer: Self-pay

## 2018-11-28 NOTE — Telephone Encounter (Signed)
Called and left voicemail for patient or her son to call back. Needs to ask them questions regarding to their virtual OV on 11/19

## 2018-11-30 ENCOUNTER — Ambulatory Visit (INDEPENDENT_AMBULATORY_CARE_PROVIDER_SITE_OTHER): Payer: Medicare Other | Admitting: Gastroenterology

## 2018-11-30 ENCOUNTER — Other Ambulatory Visit: Payer: Self-pay

## 2018-11-30 ENCOUNTER — Encounter: Payer: Self-pay | Admitting: Gastroenterology

## 2018-11-30 VITALS — Ht 63.0 in | Wt 170.0 lb

## 2018-11-30 DIAGNOSIS — Z7901 Long term (current) use of anticoagulants: Secondary | ICD-10-CM

## 2018-11-30 DIAGNOSIS — C182 Malignant neoplasm of ascending colon: Secondary | ICD-10-CM

## 2018-11-30 NOTE — Patient Instructions (Addendum)
If you are age 83 or older, your body mass index should be between 23-30. Your Body mass index is 30.11 kg/m. If this is out of the aforementioned range listed, please consider follow up with your Primary Care Provider.  If you are age 58 or younger, your body mass index should be between 19-25. Your Body mass index is 30.11 kg/m. If this is out of the aformentioned range listed, please consider follow up with your Primary Care Provider.   You will be due for a recall colonoscopy in 05-2019. We will send you a reminder in the mail when it gets closer to that time.  It was a pleasure to see you today!

## 2018-11-30 NOTE — Progress Notes (Signed)
Chief Complaint: Colon Cancer surveillance  GI Hx: 85 year old female with stage I (T1 N0 M0) Adenocarcinoma of the ascending colon (MSI high), diagnosed in 05/2018 during admission for symptomatic anemia.  Hemoglobin was 5.8 on admission, treated with PRBC transfusions.  Colonoscopy on that admission demonstrated ascending colon mass with biopsy-proven adenocarcinoma, s/p right hemicolectomy.  No indication for adjuvant chemotherapy or radiation.  Patient declined genetic testing.  Separately, she has an incidental right breast mass, but she and her family have declined work-up for this. PMHx also includes severe aortic stenosis s/p TAVR 02/2018 (heart block brief asystole requiring CPR and atropine during hospitalization in 05/2018, permanent pacemaker placed), dual antiplatelet therapy (ASA/Plavix), hypertension, hyperlipidemia, hypothyroidism, PVD, carotid artery stenosis, CHF EF 60-65%, CVA with residual right hemiparesis,  obesity (BMI 30).   HPI:    Due to current restrictions/limitations of in-office visits due to the COVID-19 pandemic, this scheduled clinical appointment was converted to a telehealth virtual consultation using [employer].   -Time of medical discussion: 21 minutes -The patient did consent to this virtual visit and is aware of possible charges through their insurance for this visit.  -Names of all parties present: Julie Mcgee (patient), Julie Mcgee (son), Julie Heck, DO, [facility] (physician) -Patient location: Home -Physician location: Office  Julie Mcgee is a 85 y.o. female referred to the Gastroenterology Clinic for follow-up.  Was initially seen by me as an inpatient 05/2018 when she was admitted for symptomatic anemia, diagnosed with right sided adenocarcinoma, treated with right hemicolectomy without need for neoadjuvant chemotherapy or radiation.  Has since followed closely with Dr. Maylon Mcgee in Hematology/Oncology.  MSI high on testing, and met with  Genetic Clinic, but declined genetic testing. She presents today to discuss ongoing colon cancer surveillance following surgical cure.   Did have a recent syncopal event with fall in the bathroom and hit her head.  Was seen in the ER for this and admitted 10/21-23.  CT head negative.  CTA of head and neck was obtained which showed bilateral stenosis of ICA. Possibility 80% of left ICA. EDP talk to the neurology and schedule patient for outpatient follow-up.  Evaluated by Vascular Surgery and did not think left stenosis was a contributing factor.  Fall was suspected 2/2 orthostatic hypotension from dehydration (orthostatic vitals positive in ED). No issues since d/c to home, and has since followed up in the Neurology Clinic last week with no no interventions/studies planned.   Most recent hemoglobin 11.5, normocytic.  From a GI standpoint, she has no complaints and feels well. No hematochezia, melena, n/v/d/c/f/c, abdominal pain.   Past medical history, past surgical history, social history, family history, medications, and allergies reviewed in the chart and with patient.    Past Medical History:  Diagnosis Date  . Arthritis   . Carotid artery stenosis   . Chronic diastolic CHF (congestive heart failure) ([place])   . Family history of skin cancer   . Former smoker    1/2 ppd for 45 years  . 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 Hyperlipidemia   . Hypertension   . Hypothyroidism   . PAF (paroxysmal atrial fibrillation) ([place])   . Peripheral vascular disease ([place])   . RBBB   . 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 Severe aortic stenosis  Past Surgical History:  Procedure Laterality Date  . BASAL CELL CARCINOMA EXCISION  2018  . BIOPSY  05/19/2018   Procedure: BIOPSY;  Surgeon: Lavena Bullion, DO;  Location: [facility];  Service: Gastroenterology;;  . CARDIAC CATHETERIZATION    . COLONOSCOPY  2001  . COLONOSCOPY WITH PROPOFOL N/A 05/19/2018   Procedure: COLONOSCOPY WITH PROPOFOL;  Surgeon: Lavena Bullion, DO;  Location: [facility];  Service: Gastroenterology;  Laterality: N/A;  . ESOPHAGOGASTRODUODENOSCOPY (EGD) WITH PROPOFOL N/A 05/19/2018   Procedure: ESOPHAGOGASTRODUODENOSCOPY (EGD) WITH PROPOFOL;  Surgeon: Lavena Bullion, DO;  Location: [facility];  Service: Gastroenterology;  Laterality: N/A;  . HIP SURGERY  2005  . KNEE SURGERY  1998  . LAPAROSCOPIC PARTIAL COLECTOMY N/A 05/24/2018   Procedure: LAPAROSCOPIC ASSISTED ASCENDING HEMICOLECTOMY;  Surgeon: Ileana Roup, MD;  Location: [facility];  Service: General;  Laterality: N/A;  . PACEMAKER IMPLANT N/A 03/02/2018   Procedure: PACEMAKER IMPLANT;  Surgeon: Evans Lance, MD;  Location: [facility];  Service: Cardiovascular;  Laterality: N/A;  . RIGHT/LEFT HEART CATH AND CORONARY ANGIOGRAPHY N/A 02/21/2018   Procedure: RIGHT/LEFT HEART CATH AND CORONARY ANGIOGRAPHY;  Surgeon: Sherren Mocha, MD;  Location: [facility];  Service: Cardiovascular;  Laterality: N/A;  . SUBMUCOSAL TATTOO INJECTION  05/19/2018   Procedure: SUBMUCOSAL TATTOO INJECTION;  Surgeon: Lavena Bullion, DO;  Location: [facility];  Service: Gastroenterology;;  . TEE WITHOUT CARDIOVERSION N/A 02/28/2018   Procedure: TRANSESOPHAGEAL ECHOCARDIOGRAM (TEE);  Surgeon: Sherren Mocha, MD;  Location: [facility];  Service: Open Heart Surgery;  Laterality: N/A;  . TEMPORARY PACEMAKER N/A 03/01/2018   Procedure: TEMPORARY PACEMAKER;  Surgeon: Burnell Blanks, MD;  Location: [facility];  Service: Cardiovascular;  Laterality: N/A;  . TONSILLECTOMY    . TRANSCATHETER AORTIC VALVE REPLACEMENT, TRANSFEMORAL N/A 02/28/2018   Procedure: TRANSCATHETER AORTIC VALVE REPLACEMENT, TRANSFEMORAL;  Surgeon: Sherren Mocha, MD;  Location: [place] [facility];  Service: Open Heart Surgery;  Laterality: N/A;   Family History  Problem Relation Age of Onset  . Heart failure Mother   . Chronic Renal Failure Mother   . Alcohol abuse Father   . Skin cancer Sister 70  . Skin cancer Sister 58   Social History   Tobacco Use  . Smoking status: Former Smoker    Packs/day: 1.00    Years: 45.00    Pack years: 45.00    Types: Cigarettes    Quit date: 05/23/2009    Years since quitting: 9.5  . Smokeless tobacco: Never Used  Substance Use Topics  . Alcohol use: Not Currently  . Drug use: Never   Current Outpatient Medications  Medication Sig Dispense Refill  . amoxicillin (AMOXIL) 500 MG tablet Please take 2,000 mg (4 tablets) one hour prior to all dental procedures. 8 tablet 8  . apixaban (ELIQUIS) 5 MG TABS tablet Take 1 tablet (5 mg total) by mouth 2 (two) times daily. 60 tablet 0  . Calcium Carb-Cholecalciferol (CALTRATE 600+D3 PO) Take 1 tablet by mouth 2 (two) times daily.    . ferrous sulfate 325 (65 FE) MG tablet Take 1 tablet (325 mg total) by mouth every other day. 15 tablet 0  . furosemide (LASIX) 20 MG tablet Take 1 tablet (20 mg total) by mouth daily as needed for fluid or edema.    [name] levothyroxine (SYNTHROID) 100 MCG tablet Take 100 mcg by mouth daily before breakfast.     . metoprolol tartrate (LOPRESSOR) 50  MG tablet Take 50 mg by mouth 2 (two) times daily.    . Multiple Vitamins-Minerals (MULTIVITAMIN ADULTS 50+) TABS Take 1 tablet by mouth daily.    . pantoprazole (PROTONIX) 40 MG tablet Take 1 tablet (40 mg total) by mouth daily. 30 tablet 0  . saccharomyces boulardii (FLORASTOR) 250 MG capsule Take 250 mg by mouth 2 (two) times daily.    . simvastatin (ZOCOR) 40 MG tablet Take 1 tablet (40 mg total) by mouth at bedtime. 90 tablet 3  . simvastatin (ZOCOR) 80 MG tablet      No current facility-administered medications for this visit.    Allergies  Allergen Reactions  . Adhesive [Tape] Other (See Comments)    Causes  BLISTERS     Review of Systems: All systems reviewed and negative except where noted in HPI.     Physical Exam:    Complete physical exam not completed due to the nature of this telehealth communication.   Gen: Awake, alert, and oriented, and well communicative.   ASSESSMENT AND PLAN;   1) Colon Cancer 85 year old female with stage I (T1 N0 M0) Adenocarcinoma of the ascending colon (MSI high), diagnosed in 05/2018 during admission for symptomatic anemia now s/p right hemicolectomy on that admission.  Feeling well without any postoperative issues. -Repeat colonoscopy for ongoing surveillance in 05/2019.  Will place recall -First-degree relatives updated on colon cancer screening recommendations  2) Dual antiplatelet therapy -Plan to hold Plavix 5 days prior to colonoscopy.  Okay to resume aspirin through perioperative period  Lavena Bullion, DO, FACG  11/30/2018, 8:15 AM   Masneri, Adele Barthel, DO

## 2018-12-06 ENCOUNTER — Ambulatory Visit: Payer: Medicare Other | Admitting: Neurology

## 2018-12-06 ENCOUNTER — Telehealth: Payer: Self-pay | Admitting: Cardiology

## 2018-12-06 DIAGNOSIS — I130 Hypertensive heart and chronic kidney disease with heart failure and stage 1 through stage 4 chronic kidney disease, or unspecified chronic kidney disease: Secondary | ICD-10-CM | POA: Diagnosis not present

## 2018-12-06 DIAGNOSIS — S01112D Laceration without foreign body of left eyelid and periocular area, subsequent encounter: Secondary | ICD-10-CM | POA: Diagnosis not present

## 2018-12-06 DIAGNOSIS — N183 Chronic kidney disease, stage 3 unspecified: Secondary | ICD-10-CM | POA: Diagnosis not present

## 2018-12-06 DIAGNOSIS — R296 Repeated falls: Secondary | ICD-10-CM | POA: Diagnosis not present

## 2018-12-06 DIAGNOSIS — Z87891 Personal history of nicotine dependence: Secondary | ICD-10-CM | POA: Diagnosis not present

## 2018-12-06 DIAGNOSIS — I69331 Monoplegia of upper limb following cerebral infarction affecting right dominant side: Secondary | ICD-10-CM | POA: Diagnosis not present

## 2018-12-06 DIAGNOSIS — C182 Malignant neoplasm of ascending colon: Secondary | ICD-10-CM | POA: Diagnosis not present

## 2018-12-06 DIAGNOSIS — R55 Syncope and collapse: Secondary | ICD-10-CM | POA: Diagnosis not present

## 2018-12-06 DIAGNOSIS — I739 Peripheral vascular disease, unspecified: Secondary | ICD-10-CM | POA: Diagnosis not present

## 2018-12-06 DIAGNOSIS — Z7901 Long term (current) use of anticoagulants: Secondary | ICD-10-CM | POA: Diagnosis not present

## 2018-12-06 DIAGNOSIS — I69322 Dysarthria following cerebral infarction: Secondary | ICD-10-CM | POA: Diagnosis not present

## 2018-12-06 DIAGNOSIS — Z950 Presence of cardiac pacemaker: Secondary | ICD-10-CM | POA: Diagnosis not present

## 2018-12-06 DIAGNOSIS — I5032 Chronic diastolic (congestive) heart failure: Secondary | ICD-10-CM | POA: Diagnosis not present

## 2018-12-06 NOTE — Telephone Encounter (Signed)
°  1. Has your device fired? no  2. Is you device beeping? no  3. Are you experiencing draining or swelling at device site? no  4. Are you calling to see if we received your device transmission? no  5. Have you passed out? No  Patients son is calling needing assistance with sending a transmission. As well as to confirm what email we have on file for [employer].    Please route to [place]

## 2018-12-06 NOTE — Telephone Encounter (Signed)
Patient and her son Julie Mcgee have been on the phone with [employer] attempting to get the mobile app to connect with the device.  Unable to get situation resolved because e-mail associated with the registration is unknown. [employer] advised to contact our office to find out what e-mail we used to set up the app. Explained that our office did not register it but may have been done in hospital when implant was done. I reassured patient that I would reach out to [employer] representative to get recommendations on how to correct problem.  Julie Mcgee  asked that he be contacted in reference to issue at [phone].

## 2018-12-11 DIAGNOSIS — I130 Hypertensive heart and chronic kidney disease with heart failure and stage 1 through stage 4 chronic kidney disease, or unspecified chronic kidney disease: Secondary | ICD-10-CM | POA: Diagnosis not present

## 2018-12-11 DIAGNOSIS — R55 Syncope and collapse: Secondary | ICD-10-CM | POA: Diagnosis not present

## 2018-12-11 DIAGNOSIS — C182 Malignant neoplasm of ascending colon: Secondary | ICD-10-CM | POA: Diagnosis not present

## 2018-12-11 DIAGNOSIS — N183 Chronic kidney disease, stage 3 unspecified: Secondary | ICD-10-CM | POA: Diagnosis not present

## 2018-12-11 DIAGNOSIS — I5032 Chronic diastolic (congestive) heart failure: Secondary | ICD-10-CM | POA: Diagnosis not present

## 2018-12-11 DIAGNOSIS — I69331 Monoplegia of upper limb following cerebral infarction affecting right dominant side: Secondary | ICD-10-CM | POA: Diagnosis not present

## 2018-12-13 NOTE — Telephone Encounter (Signed)
Discussed with Tomi Bamberger, [employer] representative. Will plan to order [phone] monitor if patient and son are agreeable. Will contact on 12/14/18.

## 2018-12-14 NOTE — Telephone Encounter (Signed)
Spoke with [name] (DPR).  Offered to order [phone] monitor. [name] is agreeable, aware it should arrive within 14 business days. Confirmed address. Gave brief instructions about monitor setup, advised to call if they need any assistance.   Monitor ordered via [employer] support email address.

## 2018-12-19 NOTE — Telephone Encounter (Signed)
New monitor shipped 12/18/18 per [employer].

## 2018-12-25 ENCOUNTER — Telehealth: Payer: Self-pay | Admitting: Cardiology

## 2018-12-25 NOTE — Telephone Encounter (Signed)
Pt c/o medication issue:  1. Name of Medication: apixaban (ELIQUIS) 5 MG TABS tablet  2. How are you currently taking this medication (dosage and times per day)? 1 tablet by mouth twice daily   3. Are you having a reaction (difficulty breathing--STAT)? No  4. What is your medication issue? Patients son is calling wanting to know if there's anyway they can go about making the price of the medication lower. He is wanting to know if it can be change to a 30 day supply, another name brand of medication or if there's a program to help with the cost. He states if she needs to stay on medication the way it is now he will just suck it up and pay for it, but just wanted to see if any changes were possible.

## 2018-12-26 MED ORDER — APIXABAN 5 MG PO TABS: 5.0000 mg | ORAL_TABLET | Freq: Two times a day (BID) | ORAL | 0 refills | Status: DC

## 2018-12-26 NOTE — Telephone Encounter (Signed)
I spoke with patient's son, [name], and they are trying to find the cheapest way to get Eliquis for the patient. He found out that [facility] will take his insurance and provide a rebate and would like her prescription sent there instead. Prescription has been sent.

## 2018-12-28 ENCOUNTER — Telehealth: Payer: Self-pay

## 2018-12-28 NOTE — Telephone Encounter (Signed)
Transmission received 12/28/2018

## 2018-12-29 ENCOUNTER — Ambulatory Visit (INDEPENDENT_AMBULATORY_CARE_PROVIDER_SITE_OTHER): Payer: Medicare Other | Admitting: *Deleted

## 2018-12-29 DIAGNOSIS — I442 Atrioventricular block, complete: Secondary | ICD-10-CM

## 2018-12-29 LAB — CUP PACEART REMOTE DEVICE CHECK
Battery Remaining Longevity: 144 mo
Battery Voltage: 3.07 V
Brady Statistic AP VP Percent: 50.52 %
Brady Statistic AP VS Percent: 8.81 %
Brady Statistic AS VP Percent: 35.94 %
Brady Statistic AS VS Percent: 4.73 %
Brady Statistic RA Percent Paced: 59.26 %
Brady Statistic RV Percent Paced: 86.46 %
Date Time Interrogation Session: 20201217165749
Implantable Lead Implant Date: 20200220
Implantable Lead Implant Date: 20200220
Implantable Lead Location: 753859
Implantable Lead Location: 753860
Implantable Lead Model: 3830
Implantable Lead Model: 5076
Implantable Pulse Generator Implant Date: 20200220
Lead Channel Impedance Value: 285 Ohm
Lead Channel Impedance Value: 304 Ohm
Lead Channel Impedance Value: 380 Ohm
Lead Channel Impedance Value: 456 Ohm
Lead Channel Pacing Threshold Amplitude: 0.5 V
Lead Channel Pacing Threshold Amplitude: 1.25 V
Lead Channel Pacing Threshold Pulse Width: 0.4 ms
Lead Channel Pacing Threshold Pulse Width: 0.4 ms
Lead Channel Sensing Intrinsic Amplitude: 2.875 mV
Lead Channel Sensing Intrinsic Amplitude: 2.875 mV
Lead Channel Sensing Intrinsic Amplitude: 23.625 mV
Lead Channel Sensing Intrinsic Amplitude: 23.625 mV
Lead Channel Setting Pacing Amplitude: 1.5 V
Lead Channel Setting Pacing Amplitude: 2.5 V
Lead Channel Setting Pacing Pulse Width: 0.4 ms
Lead Channel Setting Sensing Sensitivity: 2.8 mV

## 2018-12-29 NOTE — Telephone Encounter (Signed)
Added to schedule for processing. 

## 2019-01-02 ENCOUNTER — Telehealth: Payer: Self-pay | Admitting: Cardiology

## 2019-01-02 NOTE — Telephone Encounter (Signed)
Message received saying pt needs samples of Eliquis. Called pt and spoke to pt's son who manages pt's medications. Pt has Eliquis mail order refill coming in. However, pt's son concerned that medication might not come in on time because of the holidays and would not be able to pick up samples on Monday if Eliquis dose not arrive on time. Informed him that we could give him samples: 4 tablets of Eliquis 5mg  that he can pick up tomorrow at the [address] office.

## 2019-01-02 NOTE — Telephone Encounter (Signed)
    Patient calling the office for samples of medication:   1.  What medication and dosage are you requesting samples for? Eliquis  2.  Are you currently out of this medication? Will take last dose Monday, waiting on mailorder

## 2019-01-09 ENCOUNTER — Telehealth: Payer: Self-pay | Admitting: Cardiology

## 2019-01-09 NOTE — Telephone Encounter (Signed)
I called pt's Son, I will put 2 boxes of Eliquis downstairs for him to pick up tomorrow morning. I also instructed him on how to fill out the Pt Asst application for her. He said he had one at home and would work on getting it completed and back to [place].

## 2019-01-09 NOTE — Telephone Encounter (Signed)
Patient calling the office for samples of medication:   1.  What medication and dosage are you requesting samples for? apixaban (ELIQUIS) 5 MG TABS tablet  2.  Are you currently out of this medication? Yes   Patients eliquis is stuck in the mail, will need a sample until delivered.

## 2019-01-09 NOTE — Telephone Encounter (Signed)
Patient's son called stating he can stop by today to pick up samples.

## 2019-01-09 NOTE — Telephone Encounter (Signed)
Samples are downstairs ready for him to pick up.

## 2019-01-09 NOTE — Telephone Encounter (Signed)
Hello,  We do not process samples, the refill dept handles them. Thanks

## 2019-01-17 NOTE — Progress Notes (Signed)
PPM remote 

## 2019-01-18 DIAGNOSIS — T148XXA Other injury of unspecified body region, initial encounter: Secondary | ICD-10-CM | POA: Diagnosis not present

## 2019-01-18 DIAGNOSIS — R300 Dysuria: Secondary | ICD-10-CM | POA: Diagnosis not present

## 2019-01-25 ENCOUNTER — Other Ambulatory Visit: Payer: Self-pay | Admitting: Physician Assistant

## 2019-01-25 MED ORDER — FUROSEMIDE 20 MG PO TABS: 20.0000 mg | ORAL_TABLET | Freq: Every day | ORAL | 3 refills | Status: DC | PRN

## 2019-02-02 ENCOUNTER — Encounter: Payer: Self-pay | Admitting: Podiatry

## 2019-02-02 ENCOUNTER — Ambulatory Visit (INDEPENDENT_AMBULATORY_CARE_PROVIDER_SITE_OTHER): Payer: Medicare Other | Admitting: Podiatry

## 2019-02-02 ENCOUNTER — Other Ambulatory Visit: Payer: Self-pay

## 2019-02-02 DIAGNOSIS — B351 Tinea unguium: Secondary | ICD-10-CM | POA: Diagnosis not present

## 2019-02-02 DIAGNOSIS — M79676 Pain in unspecified toe(s): Secondary | ICD-10-CM

## 2019-02-02 NOTE — Patient Instructions (Signed)

## 2019-02-08 NOTE — Progress Notes (Signed)
Subjective: Julie Mcgee presents today for follow up of painful mycotic nails b/l that are difficult to trim. Pain interferes with ambulation. Aggravating factors include wearing enclosed shoe gear. Pain is relieved with periodic professional debridement.   Allergies  Allergen Reactions  . Adhesive [Tape] Other (See Comments)    Causes BLISTERS     Objective: There were no vitals filed for this visit.  Vascular Examination:  Capillary refill time to digits immediate b/l, palpable DP pulses b/l, palpable PT pulses b/l, pedal hair sparse b/l and skin temperature gradient within normal limits b/l  Dermatological Examination: Pedal skin with normal turgor, texture and tone bilaterally and toenails 1-5 b/l elongated, dystrophic, thickened, crumbly with subungual debris  Musculoskeletal: Normal muscle strength 5/5 to all lower extremity muscle groups bilaterally, no gross bony deformities bilaterally and no pain crepitus or joint limitation noted with ROM b/l  Neurological: Protective sensation intact 5/5 intact bilaterally with 10g monofilament b/l and vibratory sensation intact b/l  Assessment: 1. Pain due to onychomycosis of toenail    Plan: -Toenails 1-5 b/l were debrided in length and girth without iatrogenic bleeding. -Patient to continue soft, supportive shoe gear daily. -Patient to report any pedal injuries to medical professional immediately. -Patient/POA to call should there be question/concern in the interim.  Return in about 3 months (around 05/03/2019) for nail trim/ Eliquis.

## 2019-02-09 ENCOUNTER — Ambulatory Visit: Payer: Medicare Other

## 2019-02-14 ENCOUNTER — Ambulatory Visit: Payer: Medicare Other

## 2019-02-15 DIAGNOSIS — Z23 Encounter for immunization: Secondary | ICD-10-CM | POA: Diagnosis not present

## 2019-02-18 ENCOUNTER — Ambulatory Visit: Payer: Medicare Other

## 2019-03-01 ENCOUNTER — Ambulatory Visit: Payer: Medicare Other | Admitting: Physician Assistant

## 2019-03-01 ENCOUNTER — Other Ambulatory Visit (HOSPITAL_COMMUNITY): Payer: Medicare Other

## 2019-03-07 ENCOUNTER — Other Ambulatory Visit: Payer: Self-pay | Admitting: Physician Assistant

## 2019-03-07 NOTE — Progress Notes (Addendum)
[facility]   MULTIDISCIPLINARY HEART VALVE TEAM  Virtual Visit via Telephone Note   This visit type was conducted due to national recommendations for restrictions regarding the COVID-19 Pandemic (e.g. social distancing) in an effort to limit this patient's exposure and mitigate transmission in our community.  Due to her co-morbid illnesses, this patient is at least at moderate risk for complications without adequate follow up.  This format is felt to be most appropriate for this patient at this time.  The patient did not have access to video technology/had technical difficulties with video requiring transitioning to audio format only (telephone).  All issues noted in this document were discussed and addressed.  No physical exam could be performed with this format.  Please refer to the patient's chart for her  consent to telehealth for [facility].   Evaluation Performed:  Follow-up visit  Date:  03/08/2019   ID:  Julie Mcgee, DOB December 26, 1933, MRN [id number]  Patient Location: Home Provider Location: Office  PCP:  Lyman Bishop, DO  Cardiologist:  Dr. Radford Pax / Dr. Burt Knack & Dr. Roxy Manns (TAVR) Electrophysiologist:  Cristopher Peru, MD   Chief Complaint:  1 year s/p TAVR  History of Present Illness:    Julie Mcgee is a 86 y.o. female with a history of obesity, HTN, HLD, RBBB, previousCVAwith residual R hemiparesis,carotid artery disease, rheumatic mitral valve disease, PAF (found on device interrogation) now on Eliquis, chronic diastolic CHF,andsevere ASs/p TAVR (02/28/18)c/b CHB requiring PPM placement who presents via audio conferencing for a telehealth visit today.    The patient does not have symptoms concerning for COVID-19 infection (fever, chills, cough, or new shortness of breath).   She underwent TAVR with a53m Edwards Sapien 3 THV via the TF approach on 02/28/18. Post operative echoshowed EF 60%, mod MS, no PVL, increased gradients with mean gradient of  22 mm Hg likely due to patients large size with a 23 mm valve. Patient's hospital course was c/b CHB with asystole requiring brief CPR and ionotropes. She underwent PPM by Dr. TLovena Leon 03/02/18. She also had post operative anemia requiring 2 U PRBC transfusion and acute CHF requiring IV diuresis. Patient became very weak after prolonged bedrest and was discharged to [facility]. She was discharged on Aspirin and Plavix.   Unfortunately she was diagnosed with colon cancer in her ascending colon after a GI bleed. She had been on DAPT for her recent TNovella Olivethat was held.She had hemicolectomy on 05/24/2018 for invasive adeno CA. Prior to discharge she was also noted to have a mass in her right breast by CT that was suspicious for cancer and this is going to be worked up as an outpatient. She was seen by oncology on 06/08/2018 for follow-up of her stage I adeno CA of the ascending colon she was found to have a BRAF mutation.They reviewed her staging scans which showed a 2.6 cm lobulated mass in the right central breast with enlarged mediastinal and hilar lymph nodes. It was felt that this was likely a primary breast cancer and not metastasis. Patient was not ready to consider further work-up at that time.  Today she presents for follow up via virtual medicine. Her son, wGwyndolyn Saxon is conferenced in. [place]. Stays busy doing cooking and cleaning. No CP or SOB. No LE edema, orthopnea or PND. No dizziness or syncope. No blood in stool or urine. No palpitations. Overall, she is very happy with how she is doing.  Having trouble paying for Eliquis and filled out patient assistance.   Past Medical History:  Diagnosis Date  . Arthritis   . Carotid artery stenosis   . Chronic diastolic CHF (congestive heart failure) ([place])   . Family history of skin cancer   . Former smoker    1/2 ppd for 45 years  . History of CVA (cerebrovascular accident) 2011   with residual R  hemiparesis  . Hyperlipidemia   . Hypertension   . Hypothyroidism   . PAF (paroxysmal atrial fibrillation) ([place])   . Peripheral vascular disease ([place])   . RBBB   . 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 Severe aortic stenosis    Past Surgical History:  Procedure Laterality Date  . BASAL CELL CARCINOMA EXCISION  2018  . BIOPSY  05/19/2018   Procedure: BIOPSY;  Surgeon: Lavena Bullion, DO;  Location: [place];  Service: Gastroenterology;;  . CARDIAC CATHETERIZATION    . COLONOSCOPY  2001  . COLONOSCOPY WITH PROPOFOL N/A 05/19/2018   Procedure: COLONOSCOPY WITH PROPOFOL;  Surgeon: Lavena Bullion, DO;  Location: [place];  Service: Gastroenterology;  Laterality: N/A;  . ESOPHAGOGASTRODUODENOSCOPY (EGD) WITH PROPOFOL N/A 05/19/2018   Procedure: ESOPHAGOGASTRODUODENOSCOPY (EGD) WITH PROPOFOL;  Surgeon: Lavena Bullion, DO;  Location: [place];  Service: Gastroenterology;  Laterality: N/A;  . HIP SURGERY  2005  . KNEE SURGERY  1998  . LAPAROSCOPIC PARTIAL COLECTOMY N/A 05/24/2018   Procedure: LAPAROSCOPIC ASSISTED ASCENDING HEMICOLECTOMY;  Surgeon: Ileana Roup, MD;  Location: [place];  Service: General;  Laterality: N/A;  . PACEMAKER IMPLANT N/A 03/02/2018   Procedure: PACEMAKER IMPLANT;  Surgeon: Evans Lance, MD;  Location: [place] [facility];  Service: Cardiovascular;  Laterality: N/A;  . RIGHT/LEFT HEART CATH AND CORONARY ANGIOGRAPHY N/A 02/21/2018   Procedure: RIGHT/LEFT HEART CATH AND CORONARY ANGIOGRAPHY;  Surgeon: Sherren Mocha, MD;  Location: [place] [facility];  Service: Cardiovascular;  Laterality: N/A;  . SUBMUCOSAL TATTOO INJECTION  05/19/2018   Procedure: SUBMUCOSAL TATTOO INJECTION;  Surgeon: Lavena Bullion, DO;  Location: [place];  Service: Gastroenterology;;  . TEE WITHOUT CARDIOVERSION N/A 02/28/2018   Procedure:  TRANSESOPHAGEAL ECHOCARDIOGRAM (TEE);  Surgeon: Sherren Mocha, MD;  Location: [place] [facility];  Service: Open Heart Surgery;  Laterality: N/A;  . TEMPORARY PACEMAKER N/A 03/01/2018   Procedure: TEMPORARY PACEMAKER;  Surgeon: Burnell Blanks, MD;  Location: [place] [facility];  Service: Cardiovascular;  Laterality: N/A;  . TONSILLECTOMY    . TRANSCATHETER AORTIC VALVE REPLACEMENT, TRANSFEMORAL N/A 02/28/2018   Procedure: TRANSCATHETER AORTIC VALVE REPLACEMENT, TRANSFEMORAL;  Surgeon: Sherren Mocha, MD;  Location: [place] [facility];  Service: Open Heart Surgery;  Laterality: N/A;     Current Meds  Medication Sig  . amoxicillin (AMOXIL) 500 MG tablet Please take 2,000 mg (4 tablets) one hour prior to all dental procedures.  [name] apixaban (ELIQUIS) 5 MG TABS tablet Take 1 tablet (5 mg total) by mouth 2 (two) times daily.  . Calcium Carb-Cholecalciferol (CALTRATE 600+D3 PO) Take 1 tablet by mouth 2 (two) times daily.  . cephALEXin (KEFLEX) 500 MG capsule   . ferrous sulfate 325 (65 FE) MG tablet Take 1 tablet (325 mg total) by mouth every other day.  . furosemide (LASIX) 20 MG tablet Take 1 tablet (20 mg total) by mouth daily as needed for fluid or edema.  [name] levothyroxine (  SYNTHROID) 100 MCG tablet Take 100 mcg by mouth daily before breakfast.   . metoprolol tartrate (LOPRESSOR) 50 MG tablet Take 50 mg by mouth 2 (two) times daily.  . Multiple Vitamins-Minerals (MULTIVITAMIN ADULTS 50+) TABS Take 1 tablet by mouth daily.  . pantoprazole (PROTONIX) 40 MG tablet Take 1 tablet (40 mg total) by mouth daily.  [name] saccharomyces boulardii (FLORASTOR) 250 MG capsule Take 250 mg by mouth 2 (two) times daily.  . simvastatin (ZOCOR) 40 MG tablet Take 1 tablet (40 mg total) by mouth at bedtime.  . simvastatin (ZOCOR) 80 MG tablet      Allergies:   Adhesive [tape]   Social History   Tobacco Use  . Smoking status: Former Smoker    Packs/day: 1.00    Years: 45.00    Pack years: 45.00    Types:  Cigarettes    Quit date: 05/23/2009    Years since quitting: 9.7  . Smokeless tobacco: Never Used  Substance Use Topics  . Alcohol use: Not Currently  . Drug use: Never     Family Hx: The patient's family history includes Alcohol abuse in her father; Chronic Renal Failure in her mother; Heart failure in her mother; Skin cancer (age of onset: 28) in her sister; Skin cancer (age of onset: 72) in her sister.  ROS:   Please see the history of present illness.    All other systems reviewed and are negative.   Prior CV studies:   The following studies were reviewed today:  TAVR OPERATIVE NOTE   Date of Procedure:02/28/2018  Preoperative Diagnosis:Severe Aortic Stenosis   Postoperative Diagnosis:Same   Procedure:   Transcatheter Aortic Valve Replacement - PercutaneousRightTransfemoral Approach Edwards Sapien 3 THV (size 16m, model # 9600TFX, serial # [id number]  Co-Surgeons:Clarence H. ORoxy Manns MD and MSherren Mocha MD  Anesthesiologist:William ETherisa Doyne MD  Echocardiographer:Peter NJohnsie Cancel MD  Pre-operative Echo Findings: ? Severe aortic stenosis ? Mild aortic insufficiency ? Moderate mitral stenosis ? Normalleft ventricular systolic function  Post-operative Echo Findings: ? Noparavalvular leak ? Normalleft ventricular systolic function   __________________   Echo 03/01/18: IMPRESSIONS 1. The left ventricle has normal systolic function, with an ejection fraction of 60-65%. The cavity size was normal. There is moderately increased left ventricular wall thickness. 2. Left atrial size was severely dilated. 3. Mildly thickened tricuspid valve leaflets. 4. The mitral valve is rheumatic. Severe thickening of the mitral valve leaflet. Severe calcification of the mitral valve leaflet. There is severe mitral annular calcification present. Moderate  mitral valve stenosis. 5. The tricuspid valve was normal in structure. Tricuspid valve regurgitation is moderate. 6. Post TAVR with 23 mm Sapien 3 valve Not well visualized no PVL gradients have increased considerably since implant 02/28/18.  __________________  03/02/18 PACEMAKER IMPLANT   CONCLUSIONS:  1. Successful implantation of a Medtronic dual-chamber pacemaker for symptomatic bradycardia due to complete heart block 2. No early apparent complications.     _____________   Echo 09/28/18 IMPRESSIONS  1. Left ventricular ejection fraction, by visual estimation, is 65 to  70%. The left ventricle has normal function. Left ventricular septal wall  thickness was mildly increased. Moderately increased left ventricular  posterior wall thickness. There is  moderately increased left ventricular hypertrophy.  2. Left ventricular diastolic Doppler parameters are indeterminate  pattern of LV diastolic filling.  3. Global right ventricle has normal systolic function.The right  ventricular size is normal. No increase in right ventricular wall  thickness.  4. Left atrial size was severely dilated.  5. Right atrial size was mildly dilated.  6. Severe calcification of the mitral valve leaflet(s).  7. Severe thickening of the mitral valve leaflet(s).  8. The mitral valve is rheumatic. Moderate mitral valve regurgitation.  Severe mitral stenosis.  9. The tricuspid valve is normal in structure. Tricuspid valve  regurgitation is mild.  10. The aortic valve The aortic valve is abnormal Aortic valve  regurgitation was not visualized by color flow Doppler.  11. The pulmonic valve was normal in structure. Pulmonic valve  regurgitation is not visualized by color flow Doppler.  12. Moderately elevated pulmonary artery systolic pressure.  13. The atrial septum is grossly normal.   _____________  Echo 03/08/19  IMPRESSIONS  1. 23 mm Edwards S3. V max 3.2 m/s, mean gradient 24  mmHG, EOA 1.20 cm2, DI 0.32. Suspect an element of PPM (EOAi 0.67 m2/cm2) as gradients were  high day after implant. Jet contour is not rounded to suspect obstruction. No regurgitation or paravalvular leak. Gradients stable compared with 09/28/2018. The aortic valve has been repaired/replaced. Aortic valve regurgitation is not visualized. There is a 23 mm Edwards Sapien prosthetic (TAVR) valve present in the aortic position. Procedure Date: 02/28/18.  2. Severe calcific mitral stenosis. MVA by VTI 1.15 cm2. PASP severely  elevated. The mitral valve is degenerative. Moderate to severe mitral  valve regurgitation. Severe mitral stenosis. The mean mitral valve  gradient is 11.0 mmHg with average heart rate of 60 bpm.  3. Left ventricular ejection fraction, by estimation, is 65 to 70%. The  left ventricle has normal function. The left ventricle has no regional  wall motion abnormalities. There is moderate concentric left ventricular  hypertrophy. Left ventricular  diastolic function could not be evaluated. Left ventricular diastolic  function could not be evaluated.  4. Right ventricular systolic function is normal. The right ventricular  size is mildly enlarged. There is severely elevated pulmonary artery  systolic pressure. The estimated right ventricular systolic pressure is  34.7 mmHg.  5. Left atrial size was severely dilated.  6. Right atrial size was moderately dilated.  7. Tricuspid valve regurgitation is moderate to severe.  8. The inferior vena cava is dilated in size with <50% respiratory  variability, suggesting right atrial pressure of 15 mmHg.   Labs/Other Tests and Data Reviewed:    EKG:  No ECG reviewed.  Recent Labs: 11/01/2018: ALT 13; Magnesium 1.9; TSH 3.742 11/02/2018: BUN 16; Creatinine, Ser 0.95; Hemoglobin 11.5; Platelets 233; Potassium 3.3; Sodium 140   Recent Lipid Panel No results found for: CHOL, TRIG, HDL, CHOLHDL, LDLCALC, LDLDIRECT  Wt Readings from  Last 3 Encounters:  03/08/19 175 lb (79.4 kg)  11/30/18 170 lb (77.1 kg)  11/24/18 174 lb (78.9 kg)     Objective:    Vital Signs:  BP 126/68   Pulse 64   Wt 175 lb (79.4 kg)   BMI 31.00 kg/m     ASSESSMENT & PLAN:    Severe AS s/p TAVR: echo today showed EF 65-70%, normally functioning TAVR with mean gradient of 24 mm Hg and no PVL (gradients have been elevated since POD1). She has NYHA class II symptoms but had a big improvement in her symptoms since TAVR. Continue on Eliquis '5mg'$  daily for PAF. She has amoxicillin for SBE prophylaxis.   Rheumatic mitral valve disease: echo today shows severe MS and severe MR. She is overall asymptomatic. Not a surgical candidate.   PAF: found on device interrogation. Continue Eliquis '5mg'$  BID. They have filled out  patient assistance paperwork that I have asked them to mail into the office.   HTN: BP well controlled. Continue current medications.   COVID-19 Education: The signs and symptoms of COVID-19 were discussed with the patient and how to seek care for testing (follow up with PCP or arrange E-visit).  The importance of social distancing was discussed today.  Time:   Today, I have spent 12 minutes with the patient with telehealth technology discussing the above problems.     Medication Adjustments/Labs and Tests Ordered: Current medicines are reviewed at length with the patient today.  Concerns regarding medicines are outlined above.   Tests Ordered: No orders of the defined types were placed in this encounter.   Medication Changes: No orders of the defined types were placed in this encounter.    Disposition:  Follow up in 6 month(s) with Dr. Radford Pax   Signed, Angelena Form, PA-C  03/08/2019 1:55 PM    [place]

## 2019-03-08 ENCOUNTER — Other Ambulatory Visit: Payer: Self-pay

## 2019-03-08 ENCOUNTER — Encounter: Payer: Self-pay | Admitting: Physician Assistant

## 2019-03-08 ENCOUNTER — Ambulatory Visit (HOSPITAL_COMMUNITY): Payer: Medicare Other | Attending: Cardiovascular Disease

## 2019-03-08 ENCOUNTER — Telehealth (INDEPENDENT_AMBULATORY_CARE_PROVIDER_SITE_OTHER): Payer: Medicare Other | Admitting: Physician Assistant

## 2019-03-08 VITALS — BP 126/68 | HR 64 | Wt 175.0 lb

## 2019-03-08 DIAGNOSIS — I110 Hypertensive heart disease with heart failure: Secondary | ICD-10-CM | POA: Diagnosis not present

## 2019-03-08 DIAGNOSIS — Z952 Presence of prosthetic heart valve: Secondary | ICD-10-CM | POA: Diagnosis not present

## 2019-03-08 DIAGNOSIS — E785 Hyperlipidemia, unspecified: Secondary | ICD-10-CM | POA: Insufficient documentation

## 2019-03-08 DIAGNOSIS — D649 Anemia, unspecified: Secondary | ICD-10-CM | POA: Diagnosis not present

## 2019-03-08 DIAGNOSIS — I442 Atrioventricular block, complete: Secondary | ICD-10-CM | POA: Insufficient documentation

## 2019-03-08 DIAGNOSIS — I480 Paroxysmal atrial fibrillation: Secondary | ICD-10-CM

## 2019-03-08 DIAGNOSIS — I050 Rheumatic mitral stenosis: Secondary | ICD-10-CM

## 2019-03-08 DIAGNOSIS — Z8674 Personal history of sudden cardiac arrest: Secondary | ICD-10-CM | POA: Insufficient documentation

## 2019-03-08 DIAGNOSIS — Z87891 Personal history of nicotine dependence: Secondary | ICD-10-CM | POA: Diagnosis not present

## 2019-03-08 DIAGNOSIS — I10 Essential (primary) hypertension: Secondary | ICD-10-CM

## 2019-03-08 DIAGNOSIS — I509 Heart failure, unspecified: Secondary | ICD-10-CM | POA: Diagnosis not present

## 2019-03-15 DIAGNOSIS — Z23 Encounter for immunization: Secondary | ICD-10-CM | POA: Diagnosis not present

## 2019-03-20 ENCOUNTER — Telehealth: Payer: Self-pay

## 2019-03-20 NOTE — Telephone Encounter (Signed)
**Note De-Identified Avilyn Virtue Obfuscation** I have called the pts son [name] and left a message asking him to call me back to discuss helping the pt afford her Eliquis.

## 2019-03-20 NOTE — Telephone Encounter (Signed)
I suppose it could be in my box, you should look there!

## 2019-03-20 NOTE — Telephone Encounter (Signed)
**Note De-identified Sylvester Minton Obfuscation** -----  **Note De-Identified Sherol Sabas Obfuscation** Message from Eileen Stanford, PA-C sent at 03/19/2019  1:34 PM EST ----- Regarding: RE: Eliquis samples and pt assistance Thank you so much! Jeani Hawking do you mind checking on this woman's pt assistance papers and reaching out to the son, Phillips Odor. And if we have any samples, can you call him and have him come pick up? Thanks!! KT ----- Message ----- From: Leeroy Bock, [facility] Sent: 03/19/2019   1:20 PM EST To: Deliah Boston Coreyon Nicotra, LPN, Eileen Stanford, PA-C, # Subject: Melton Alar: Eliquis samples and pt assistance          Jeani Hawking usually helps with Eliquis patient assistance, I don't have a great way of checking to see if the patient assistance made it to the office and pharmacy hasn't received anything about her. Refills usually helps with samples - I'll cc them on this message too.  Thanks, Jinny Blossom ----- Message ----- From: Crista Luria Sent: 03/19/2019   1:03 PM EST To: Leeroy Bock, RPH Subject: Eliquis samples and pt assistance              Hey can you check on this for me? I had 3 months worth that my dad gave me for our family friend that he didn't need. I sent it in the mail to this pt and it never got there. Anyway, they are almost out and a refill costs $600 (you can see our my chart messages). Do we have any other samples to give them and can you help see if their paper work got there?:. I dont know who to ask. Thank you!  KT

## 2019-03-20 NOTE — Telephone Encounter (Signed)
**Note De-Identified Avyana Puffenbarger Obfuscation** [name] did call me back and he states that he mailed the pts completed [employer] pt asst application to Nell Range, PA-c and that it is his understanding that Joellen Jersey passed it to the pharmacists who would handle it.  I have advised him that I will locate the application and that we will complete the provider part and fax all to [employer] pt asst program.  He thanked me for calling and is very appreciative for all of Katie's help.

## 2019-03-20 NOTE — Telephone Encounter (Signed)
Pharmacy has not been involved with pt assistance at all for her - I have not seen the application come through.

## 2019-03-20 NOTE — Telephone Encounter (Signed)
**Note De-identified Cloey Sferrazza Obfuscation** -----  **Note De-Identified Aleeza Bellville Obfuscation** Message from Eileen Stanford, PA-C sent at 03/19/2019  1:34 PM EST ----- Regarding: RE: Eliquis samples and pt assistance Thank you so much! Jeani Hawking do you mind checking on this woman's pt assistance papers and reaching out to the son, Phillips Odor. And if we have any samples, can you call him and have him come pick up? Thanks!! KT ----- Message ----- From: Leeroy Bock, [facility] Sent: 03/19/2019   1:20 PM EST To: Deliah Boston Antwone Capozzoli, LPN, Eileen Stanford, PA-C, # Subject: Melton Alar: Eliquis samples and pt assistance          Jeani Hawking usually helps with Eliquis patient assistance, I don't have a great way of checking to see if the patient assistance made it to the office and pharmacy hasn't received anything about her. Refills usually helps with samples - I'll cc them on this message too.  Thanks, Jinny Blossom ----- Message ----- From: Crista Luria Sent: 03/19/2019   1:03 PM EST To: Leeroy Bock, RPH Subject: Eliquis samples and pt assistance              Hey can you check on this for me? I had 3 months worth that my dad gave me for our family friend that he didn't need. I sent it in the mail to this pt and it never got there. Anyway, they are almost out and a refill costs $600 (you can see our my chart messages). Do we have any other samples to give them and can you help see if their paper work got there?:. I dont know who to ask. Thank you!  KT

## 2019-03-20 NOTE — Telephone Encounter (Signed)
**Note De-Identified Alcus Bradly Obfuscation** I am working remotely from home and the application has not been scanned and emailed to me. I will ask Claiborne Billings Clinical Supervisor to look in the office.

## 2019-03-22 ENCOUNTER — Telehealth: Payer: Self-pay

## 2019-03-22 NOTE — Telephone Encounter (Signed)
**Note De-Identified Sheridan Hew Obfuscation** The pts completed [employer] Pt Asst application was mailed to the office. I have completed the provider part of the application, scanned and emailed all to Dr Landis Gandy nurse to obtain Dr Landis Gandy signature, date and to fax all to [facility] at phone number written on cover letter included.

## 2019-03-30 ENCOUNTER — Ambulatory Visit (INDEPENDENT_AMBULATORY_CARE_PROVIDER_SITE_OTHER): Payer: Medicare Other | Admitting: *Deleted

## 2019-03-30 DIAGNOSIS — I442 Atrioventricular block, complete: Secondary | ICD-10-CM

## 2019-03-30 LAB — CUP PACEART REMOTE DEVICE CHECK
Battery Remaining Longevity: 137 mo
Battery Voltage: 3.04 V
Brady Statistic AP VP Percent: 47.99 %
Brady Statistic AP VS Percent: 1.16 %
Brady Statistic AS VP Percent: 49.95 %
Brady Statistic AS VS Percent: 0.9 %
Brady Statistic RA Percent Paced: 49.12 %
Brady Statistic RV Percent Paced: 97.94 %
Date Time Interrogation Session: 20210319000147
Implantable Lead Implant Date: 20200220
Implantable Lead Implant Date: 20200220
Implantable Lead Location: 753859
Implantable Lead Location: 753860
Implantable Lead Model: 3830
Implantable Lead Model: 5076
Implantable Pulse Generator Implant Date: 20200220
Lead Channel Impedance Value: 285 Ohm
Lead Channel Impedance Value: 304 Ohm
Lead Channel Impedance Value: 380 Ohm
Lead Channel Impedance Value: 456 Ohm
Lead Channel Pacing Threshold Amplitude: 0.5 V
Lead Channel Pacing Threshold Amplitude: 1.125 V
Lead Channel Pacing Threshold Pulse Width: 0.4 ms
Lead Channel Pacing Threshold Pulse Width: 0.4 ms
Lead Channel Sensing Intrinsic Amplitude: 2.875 mV
Lead Channel Sensing Intrinsic Amplitude: 2.875 mV
Lead Channel Sensing Intrinsic Amplitude: 23.875 mV
Lead Channel Sensing Intrinsic Amplitude: 23.875 mV
Lead Channel Setting Pacing Amplitude: 1.5 V
Lead Channel Setting Pacing Amplitude: 2.5 V
Lead Channel Setting Pacing Pulse Width: 0.4 ms
Lead Channel Setting Sensing Sensitivity: 2.8 mV

## 2019-03-30 NOTE — Progress Notes (Signed)
PPM Remote  

## 2019-05-01 NOTE — Telephone Encounter (Signed)
Julie Mcgee, Son of the patient was calling to make sure all of the forms for his Mom's application made it to the office. Please advise

## 2019-05-01 NOTE — Telephone Encounter (Signed)
**Note De-Identified Razi Hickle Obfuscation** I lefy a message on the pys VM asking him to call me back and I left [employer] Pt Asst programs phone number so he could call and check the progress of the pts application.  I then called [employer] and was advised by Brunilda Payor that they have no application on file for the pt. We will re-fax her application now.

## 2019-05-01 NOTE — Telephone Encounter (Signed)
**Note De-Identified Tameia Rafferty Obfuscation** I called back and was able to s/w [name], the pts son. I apologized to and advised him that the pts application was not received by [employer] so we are re-faxing it now.  He states that the pt still has some Eliquis samples on hand and that he will start calling [employer] daily starting tomorrow to check the status of the pts application.  He is advised to call us if the pt starts to run low on her Eliquis.

## 2019-05-04 NOTE — Telephone Encounter (Signed)
**Note De-Identified Julie Mcgee Obfuscation** Letter received from [employer] pt asst program stating that they denied the pt asst with her Eliquis. Reason: Eliquis is covered by the pts ins plan. Application Case#: [id number]  Letter states that they have notified the pt of this denial as well.

## 2019-05-07 ENCOUNTER — Telehealth: Payer: Self-pay

## 2019-05-11 ENCOUNTER — Ambulatory Visit (INDEPENDENT_AMBULATORY_CARE_PROVIDER_SITE_OTHER): Payer: Medicare Other | Admitting: Podiatry

## 2019-05-11 ENCOUNTER — Other Ambulatory Visit: Payer: Self-pay

## 2019-05-11 ENCOUNTER — Encounter: Payer: Self-pay | Admitting: Podiatry

## 2019-05-11 DIAGNOSIS — M79676 Pain in unspecified toe(s): Secondary | ICD-10-CM

## 2019-05-11 DIAGNOSIS — B351 Tinea unguium: Secondary | ICD-10-CM

## 2019-05-11 NOTE — Patient Instructions (Signed)

## 2019-05-15 NOTE — Progress Notes (Signed)
Subjective: Julie Mcgee presents today for follow up of painful mycotic nails b/l that are difficult to trim. Pain interferes with ambulation. Aggravating factors include wearing enclosed shoe gear. Pain is relieved with periodic professional debridement.   She voices no new pedal problems on today's visit.  Allergies  Allergen Reactions  . Adhesive [Tape] Other (See Comments)    Causes BLISTERS     Objective: There were no vitals filed for this visit.  Pt is a pleasant 86 y.o. year old Caucasian female in NAD. AAO x 3.   Vascular Examination:  Capillary refill time to digits immediate b/l. Palpable DP pulses b/l. Palpable PT pulses b/l. Pedal hair sparse b/l. Skin temperature gradient within normal limits b/l.  Dermatological Examination: Pedal skin with normal turgor, texture and tone bilaterally. No open wounds bilaterally. No interdigital macerations bilaterally. Toenails 1-5 b/l elongated, dystrophic, thickened, crumbly with subungual debris and tenderness to dorsal palpation.  Musculoskeletal: Normal muscle strength 5/5 to all lower extremity muscle groups bilaterally. No gross bony deformities bilaterally. No pain crepitus or joint limitation noted with ROM b/l.  Neurological: Protective sensation intact 5/5 intact bilaterally with 10g monofilament b/l. Vibratory sensation intact b/l. Proprioception intact bilaterally.  Assessment: 1. Pain due to onychomycosis of toenail    Plan: -No new findings. No new orders. -Toenails 1-5 b/l were debrided in length and girth with sterile nail nippers and dremel without iatrogenic bleeding.  -Patient to continue soft, supportive shoe gear daily. -Patient to report any pedal injuries to medical professional immediately. -Patient/POA to call should there be question/concern in the interim.  Return in about 3 months (around 08/10/2019) for nail trim.  Marzetta Board, DPM

## 2019-06-29 ENCOUNTER — Ambulatory Visit (INDEPENDENT_AMBULATORY_CARE_PROVIDER_SITE_OTHER): Payer: Medicare Other | Admitting: *Deleted

## 2019-06-29 DIAGNOSIS — I442 Atrioventricular block, complete: Secondary | ICD-10-CM | POA: Diagnosis not present

## 2019-06-29 LAB — CUP PACEART REMOTE DEVICE CHECK
Battery Remaining Longevity: 132 mo
Battery Voltage: 3.03 V
Brady Statistic AP VP Percent: 46.91 %
Brady Statistic AP VS Percent: 0 %
Brady Statistic AS VP Percent: 53.05 %
Brady Statistic AS VS Percent: 0.04 %
Brady Statistic RA Percent Paced: 46.88 %
Brady Statistic RV Percent Paced: 99.96 %
Date Time Interrogation Session: 20210618042126
Implantable Lead Implant Date: 20200220
Implantable Lead Implant Date: 20200220
Implantable Lead Location: 753859
Implantable Lead Location: 753860
Implantable Lead Model: 3830
Implantable Lead Model: 5076
Implantable Pulse Generator Implant Date: 20200220
Lead Channel Impedance Value: 304 Ohm
Lead Channel Impedance Value: 304 Ohm
Lead Channel Impedance Value: 380 Ohm
Lead Channel Impedance Value: 475 Ohm
Lead Channel Pacing Threshold Amplitude: 0.5 V
Lead Channel Pacing Threshold Amplitude: 1 V
Lead Channel Pacing Threshold Pulse Width: 0.4 ms
Lead Channel Pacing Threshold Pulse Width: 0.4 ms
Lead Channel Sensing Intrinsic Amplitude: 2.875 mV
Lead Channel Sensing Intrinsic Amplitude: 2.875 mV
Lead Channel Sensing Intrinsic Amplitude: 23.875 mV
Lead Channel Sensing Intrinsic Amplitude: 23.875 mV
Lead Channel Setting Pacing Amplitude: 1.5 V
Lead Channel Setting Pacing Amplitude: 2.5 V
Lead Channel Setting Pacing Pulse Width: 0.4 ms
Lead Channel Setting Sensing Sensitivity: 2.8 mV

## 2019-07-02 NOTE — Progress Notes (Signed)
Remote pacemaker transmission.   

## 2019-08-07 DIAGNOSIS — E039 Hypothyroidism, unspecified: Secondary | ICD-10-CM | POA: Diagnosis not present

## 2019-08-07 DIAGNOSIS — E7800 Pure hypercholesterolemia, unspecified: Secondary | ICD-10-CM | POA: Diagnosis not present

## 2019-08-07 DIAGNOSIS — C189 Malignant neoplasm of colon, unspecified: Secondary | ICD-10-CM | POA: Diagnosis not present

## 2019-08-07 DIAGNOSIS — I2510 Atherosclerotic heart disease of native coronary artery without angina pectoris: Secondary | ICD-10-CM | POA: Diagnosis not present

## 2019-08-07 DIAGNOSIS — I10 Essential (primary) hypertension: Secondary | ICD-10-CM | POA: Diagnosis not present

## 2019-08-07 DIAGNOSIS — I5032 Chronic diastolic (congestive) heart failure: Secondary | ICD-10-CM | POA: Diagnosis not present

## 2019-08-07 DIAGNOSIS — I639 Cerebral infarction, unspecified: Secondary | ICD-10-CM | POA: Diagnosis not present

## 2019-08-07 DIAGNOSIS — I480 Paroxysmal atrial fibrillation: Secondary | ICD-10-CM | POA: Diagnosis not present

## 2019-08-10 ENCOUNTER — Ambulatory Visit (INDEPENDENT_AMBULATORY_CARE_PROVIDER_SITE_OTHER): Payer: Medicare Other | Admitting: Podiatry

## 2019-08-10 ENCOUNTER — Other Ambulatory Visit: Payer: Self-pay

## 2019-08-10 ENCOUNTER — Encounter: Payer: Self-pay | Admitting: Podiatry

## 2019-08-10 DIAGNOSIS — M79676 Pain in unspecified toe(s): Secondary | ICD-10-CM

## 2019-08-10 DIAGNOSIS — B351 Tinea unguium: Secondary | ICD-10-CM | POA: Diagnosis not present

## 2019-08-11 NOTE — Progress Notes (Signed)
Subjective: Julie Mcgee presents today for follow up of painful mycotic nails b/l that are difficult to trim. Pain interferes with ambulation. Aggravating factors include wearing enclosed shoe gear. Pain is relieved with periodic professional debridement.   She voices no new pedal problems for herself on today's visit.  Patient states her husband is also here on today's visit.  They are concerned about his right foot.  He was placed on my schedule today as well.  Allergies  Allergen Reactions  . Adhesive [Tape] Other (See Comments)    Causes BLISTERS     Objective: There were no vitals filed for this visit.  Pt is a pleasant 86 y.o. year old Caucasian female in NAD. AAO x 3.   Vascular Examination:  Capillary refill time to digits immediate b/l. Palpable DP pulses b/l. Palpable PT pulses b/l. Pedal hair sparse b/l. Skin temperature gradient within normal limits b/l.  Dermatological Examination: Pedal skin with normal turgor, texture and tone bilaterally. No open wounds bilaterally. No interdigital macerations bilaterally. Toenails 1-5 b/l elongated, dystrophic, thickened, crumbly with subungual debris and tenderness to dorsal palpation.  Musculoskeletal: Normal muscle strength 5/5 to all lower extremity muscle groups bilaterally. No pain crepitus or joint limitation noted with ROM b/l. No gross bony deformities bilaterally. Utilizes walker for ambulation assistance.  Neurological: Protective sensation intact 5/5 intact bilaterally with 10g monofilament b/l. Vibratory sensation intact b/l. Proprioception intact bilaterally.  Assessment: 1. Pain due to onychomycosis of toenail    Plan: -Examined patient. -No new findings. No new orders. -Toenails 1-5 b/l were debrided in length and girth with sterile nail nippers and dremel without iatrogenic bleeding.  -Patient to report any pedal injuries to medical professional immediately. -Patient to continue soft, supportive shoe gear  daily. -Patient/POA to call should there be question/concern in the interim.  Return in about 3 months (around 11/10/2019).  Marzetta Board, DPM

## 2019-08-14 DIAGNOSIS — L89302 Pressure ulcer of unspecified buttock, stage 2: Secondary | ICD-10-CM | POA: Diagnosis not present

## 2019-08-14 DIAGNOSIS — K591 Functional diarrhea: Secondary | ICD-10-CM | POA: Diagnosis not present

## 2019-08-14 DIAGNOSIS — L03317 Cellulitis of buttock: Secondary | ICD-10-CM | POA: Diagnosis not present

## 2019-08-21 DIAGNOSIS — I69331 Monoplegia of upper limb following cerebral infarction affecting right dominant side: Secondary | ICD-10-CM | POA: Diagnosis not present

## 2019-08-21 DIAGNOSIS — L03317 Cellulitis of buttock: Secondary | ICD-10-CM | POA: Diagnosis not present

## 2019-08-21 DIAGNOSIS — I5032 Chronic diastolic (congestive) heart failure: Secondary | ICD-10-CM | POA: Diagnosis not present

## 2019-08-21 DIAGNOSIS — L89151 Pressure ulcer of sacral region, stage 1: Secondary | ICD-10-CM | POA: Diagnosis not present

## 2019-08-21 DIAGNOSIS — I130 Hypertensive heart and chronic kidney disease with heart failure and stage 1 through stage 4 chronic kidney disease, or unspecified chronic kidney disease: Secondary | ICD-10-CM | POA: Diagnosis not present

## 2019-08-21 DIAGNOSIS — K591 Functional diarrhea: Secondary | ICD-10-CM | POA: Diagnosis not present

## 2019-08-21 DIAGNOSIS — I739 Peripheral vascular disease, unspecified: Secondary | ICD-10-CM | POA: Diagnosis not present

## 2019-08-28 DIAGNOSIS — I130 Hypertensive heart and chronic kidney disease with heart failure and stage 1 through stage 4 chronic kidney disease, or unspecified chronic kidney disease: Secondary | ICD-10-CM | POA: Diagnosis not present

## 2019-08-28 DIAGNOSIS — L03317 Cellulitis of buttock: Secondary | ICD-10-CM | POA: Diagnosis not present

## 2019-08-28 DIAGNOSIS — I5032 Chronic diastolic (congestive) heart failure: Secondary | ICD-10-CM | POA: Diagnosis not present

## 2019-08-28 DIAGNOSIS — K591 Functional diarrhea: Secondary | ICD-10-CM | POA: Diagnosis not present

## 2019-08-28 DIAGNOSIS — L89151 Pressure ulcer of sacral region, stage 1: Secondary | ICD-10-CM | POA: Diagnosis not present

## 2019-08-28 DIAGNOSIS — I69331 Monoplegia of upper limb following cerebral infarction affecting right dominant side: Secondary | ICD-10-CM | POA: Diagnosis not present

## 2019-08-29 DIAGNOSIS — I69331 Monoplegia of upper limb following cerebral infarction affecting right dominant side: Secondary | ICD-10-CM | POA: Diagnosis not present

## 2019-08-29 DIAGNOSIS — L89151 Pressure ulcer of sacral region, stage 1: Secondary | ICD-10-CM | POA: Diagnosis not present

## 2019-08-29 DIAGNOSIS — I5032 Chronic diastolic (congestive) heart failure: Secondary | ICD-10-CM | POA: Diagnosis not present

## 2019-08-29 DIAGNOSIS — K591 Functional diarrhea: Secondary | ICD-10-CM | POA: Diagnosis not present

## 2019-08-29 DIAGNOSIS — L03317 Cellulitis of buttock: Secondary | ICD-10-CM | POA: Diagnosis not present

## 2019-08-29 DIAGNOSIS — I130 Hypertensive heart and chronic kidney disease with heart failure and stage 1 through stage 4 chronic kidney disease, or unspecified chronic kidney disease: Secondary | ICD-10-CM | POA: Diagnosis not present

## 2019-08-30 DIAGNOSIS — I130 Hypertensive heart and chronic kidney disease with heart failure and stage 1 through stage 4 chronic kidney disease, or unspecified chronic kidney disease: Secondary | ICD-10-CM | POA: Diagnosis not present

## 2019-08-30 DIAGNOSIS — L03317 Cellulitis of buttock: Secondary | ICD-10-CM | POA: Diagnosis not present

## 2019-08-30 DIAGNOSIS — I69331 Monoplegia of upper limb following cerebral infarction affecting right dominant side: Secondary | ICD-10-CM | POA: Diagnosis not present

## 2019-08-30 DIAGNOSIS — L89151 Pressure ulcer of sacral region, stage 1: Secondary | ICD-10-CM | POA: Diagnosis not present

## 2019-08-30 DIAGNOSIS — I5032 Chronic diastolic (congestive) heart failure: Secondary | ICD-10-CM | POA: Diagnosis not present

## 2019-08-30 DIAGNOSIS — K591 Functional diarrhea: Secondary | ICD-10-CM | POA: Diagnosis not present

## 2019-09-03 ENCOUNTER — Ambulatory Visit (INDEPENDENT_AMBULATORY_CARE_PROVIDER_SITE_OTHER): Payer: Medicare Other | Admitting: Internal Medicine

## 2019-09-03 ENCOUNTER — Other Ambulatory Visit: Payer: Self-pay

## 2019-09-03 VITALS — BP 134/66 | HR 60 | Ht 63.0 in | Wt 188.0 lb

## 2019-09-03 DIAGNOSIS — I130 Hypertensive heart and chronic kidney disease with heart failure and stage 1 through stage 4 chronic kidney disease, or unspecified chronic kidney disease: Secondary | ICD-10-CM | POA: Diagnosis not present

## 2019-09-03 DIAGNOSIS — I5032 Chronic diastolic (congestive) heart failure: Secondary | ICD-10-CM | POA: Diagnosis not present

## 2019-09-03 DIAGNOSIS — L89151 Pressure ulcer of sacral region, stage 1: Secondary | ICD-10-CM | POA: Diagnosis not present

## 2019-09-03 DIAGNOSIS — I442 Atrioventricular block, complete: Secondary | ICD-10-CM | POA: Diagnosis not present

## 2019-09-03 DIAGNOSIS — Z950 Presence of cardiac pacemaker: Secondary | ICD-10-CM | POA: Diagnosis not present

## 2019-09-03 DIAGNOSIS — I69331 Monoplegia of upper limb following cerebral infarction affecting right dominant side: Secondary | ICD-10-CM | POA: Diagnosis not present

## 2019-09-03 DIAGNOSIS — K591 Functional diarrhea: Secondary | ICD-10-CM | POA: Diagnosis not present

## 2019-09-03 DIAGNOSIS — L03317 Cellulitis of buttock: Secondary | ICD-10-CM | POA: Diagnosis not present

## 2019-09-03 NOTE — Progress Notes (Signed)
HPI Julie Mcgee returns today for follow-up.  She is a very pleasant 86 year old woman with aortic stenosis status post valve replacement.  The procedure was complicated by complete heart block.  She had right bundle branch block in the interim.  She is status post pacemaker insertion.  She has been stable in the interim with no syncope.  She denies chest pain or shortness of breath.  She is fairly inactive.  She thinks that her appetite is reduced slightly. Allergies  Allergen Reactions  . Adhesive [Tape] Other (See Comments)    Causes BLISTERS     Current Outpatient Medications  Medication Sig Dispense Refill  . amoxicillin (AMOXIL) 500 MG tablet Please take 2,000 mg (4 tablets) one hour prior to all dental procedures. 8 tablet 8  . apixaban (ELIQUIS) 5 MG TABS tablet Take 1 tablet (5 mg total) by mouth 2 (two) times daily. 60 tablet 0  . Calcium Carb-Cholecalciferol (CALTRATE 600+D3 PO) Take 1 tablet by mouth 2 (two) times daily.    . ferrous sulfate 325 (65 FE) MG tablet Take 1 tablet (325 mg total) by mouth every other day. 15 tablet 0  . furosemide (LASIX) 20 MG tablet Take 1 tablet (20 mg total) by mouth daily as needed for fluid or edema. 90 tablet 3  . levothyroxine (SYNTHROID) 100 MCG tablet Take 100 mcg by mouth daily before breakfast.     . metoprolol tartrate (LOPRESSOR) 50 MG tablet Take 50 mg by mouth 2 (two) times daily.    . Multiple Vitamins-Minerals (MULTIVITAMIN ADULTS 50+) TABS Take 1 tablet by mouth daily.    . pantoprazole (PROTONIX) 40 MG tablet Take 1 tablet (40 mg total) by mouth daily. 30 tablet 0  . saccharomyces boulardii (FLORASTOR) 250 MG capsule Take 250 mg by mouth 2 (two) times daily.    . simvastatin (ZOCOR) 40 MG tablet Take 1 tablet (40 mg total) by mouth at bedtime. 90 tablet 3   No current facility-administered medications for this visit.     Past Medical History:  Diagnosis Date  . Arthritis   . Carotid artery stenosis   . Chronic  diastolic CHF (congestive heart failure) ([place])   . Family history of skin cancer   . Former smoker    1/2 ppd for 45 years  . 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 Hyperlipidemia   . Hypertension   . Hypothyroidism   . PAF (paroxysmal atrial fibrillation) ([place])   . Peripheral vascular disease ([place])   . RBBB   . 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 Severe aortic stenosis     ROS:   All systems reviewed and negative except as noted in the HPI.   Past Surgical History:  Procedure Laterality Date  . BASAL CELL CARCINOMA EXCISION  2018  . BIOPSY  05/19/2018   Procedure: BIOPSY;  Surgeon: Lavena Bullion, DO;  Location: [facility];  Service: Gastroenterology;;  . CARDIAC CATHETERIZATION    . COLONOSCOPY  2001  . COLONOSCOPY WITH PROPOFOL N/A 05/19/2018   Procedure: COLONOSCOPY WITH PROPOFOL;  Surgeon: Lavena Bullion, DO;  Location: [place];  Service: Gastroenterology;  Laterality: N/A;  . ESOPHAGOGASTRODUODENOSCOPY (EGD) WITH PROPOFOL N/A 05/19/2018   Procedure: ESOPHAGOGASTRODUODENOSCOPY (EGD) WITH PROPOFOL;  Surgeon: Lavena Bullion, DO;  Location: [facility];  Service: Gastroenterology;  Laterality:  N/A;  . HIP SURGERY  2005  . KNEE SURGERY  1998  . LAPAROSCOPIC PARTIAL COLECTOMY N/A 05/24/2018   Procedure: LAPAROSCOPIC ASSISTED ASCENDING HEMICOLECTOMY;  Surgeon: Ileana Roup, MD;  Location: [place] [facility];  Service: General;  Laterality: N/A;  . PACEMAKER IMPLANT N/A 03/02/2018   Procedure: PACEMAKER IMPLANT;  Surgeon: Evans Lance, MD;  Location: [facility];  Service: Cardiovascular;  Laterality: N/A;  . RIGHT/LEFT HEART CATH AND CORONARY ANGIOGRAPHY N/A 02/21/2018   Procedure: RIGHT/LEFT HEART CATH AND CORONARY ANGIOGRAPHY;  Surgeon: Sherren Mocha, MD;  Location: [facility];   Service: Cardiovascular;  Laterality: N/A;  . SUBMUCOSAL TATTOO INJECTION  05/19/2018   Procedure: SUBMUCOSAL TATTOO INJECTION;  Surgeon: Lavena Bullion, DO;  Location: [facility];  Service: Gastroenterology;;  . TEE WITHOUT CARDIOVERSION N/A 02/28/2018   Procedure: TRANSESOPHAGEAL ECHOCARDIOGRAM (TEE);  Surgeon: Sherren Mocha, MD;  Location: [place] [facility];  Service: Open Heart Surgery;  Laterality: N/A;  . TEMPORARY PACEMAKER N/A 03/01/2018   Procedure: TEMPORARY PACEMAKER;  Surgeon: Burnell Blanks, MD;  Location: [facility];  Service: Cardiovascular;  Laterality: N/A;  . TONSILLECTOMY    . TRANSCATHETER AORTIC VALVE REPLACEMENT, TRANSFEMORAL N/A 02/28/2018   Procedure: TRANSCATHETER AORTIC VALVE REPLACEMENT, TRANSFEMORAL;  Surgeon: Sherren Mocha, MD;  Location: [place];  Service: Open Heart Surgery;  Laterality: N/A;     Family History  Problem Relation Age of Onset  . Heart failure Mother   . Chronic Renal Failure Mother   . Alcohol abuse Father   . Skin cancer Sister 38  . Skin cancer Sister 36     Social History   Socioeconomic History  . Marital status: Married    Spouse name: Not on file  . Number of children: 4  . Years of education: Not on file  . Highest education level: Some college, no degree  Occupational History  . Not on file  Tobacco Use  . Smoking status: Former Smoker    Packs/day: 1.00    Years: 45.00    Pack years: 45.00    Types: Cigarettes    Quit date: 05/23/2009    Years since quitting: 10.2  . Smokeless tobacco: Never Used  Vaping Use  . Vaping Use: Never used  Substance and Sexual Activity  . Alcohol use: Not Currently  . Drug use: Never  . Sexual activity: Not on file  Other Topics Concern  . Not on file  Social History Narrative   [facility] she has 4 children   She is right handed   Drinks coffee sometimes and sometimes tea and soda   Social Determinants  of Health   Financial Resource Strain:   . Difficulty of Paying Living Expenses: Not on file  Food Insecurity:   . Worried About Charity fundraiser in the Last Year: Not on file  . Ran Out of Food in the Last Year: Not on file  Transportation Needs:   . Lack of Transportation (Medical): Not on file  . Lack of Transportation (Non-Medical): Not on file  Physical Activity:   . Days of Exercise per Week: Not on file  . Minutes of Exercise per Session: Not on file  Stress:   . Feeling of Stress : Not on file  Social Connections:   . Frequency of Communication with Friends and Family: Not on file  . Frequency of Social Gatherings with Friends and Family: Not on file  .  Attends Religious Services: Not on file  . Active Member of Clubs or Organizations: Not on file  . Attends Archivist Meetings: Not on file  . Marital Status: Not on file  Intimate Partner Violence:   . Fear of Current or Ex-Partner: Not on file  . Emotionally Abused: Not on file  . Physically Abused: Not on file  . Sexually Abused: Not on file     BP 134/66   Pulse 60   Ht 5\' 3"  (1.6 m)   Wt 188 lb (85.3 kg)   SpO2 96%   BMI 33.30 kg/m   Physical Exam:  Well appearing 86 year old woman, NAD HEENT: Unremarkable Neck:  No JVD, no thyromegally Lymphatics:  No adenopathy Back:  No CVA tenderness Lungs:  Clear, with no wheezes, rales, or rhonchi HEART:  Regular rate rhythm, no murmurs, no rubs, no clicks Abd:  soft, positive bowel sounds, no organomegally, no rebound, no guarding Ext:  2 plus pulses, no edema, no cyanosis, no clubbing Skin:  No rashes no nodules Neuro:  CN II through XII intact, motor grossly intact  EKG -normal sinus rhythm with ventricular pacing.  QRS duration 112  DEVICE  Normal device function.  See PaceArt for details.   Assess/Plan: 1.  Complete heart block -she is asymptomatic status post pacemaker insertion 2.  Pacemaker -her Medtronic dual-chamber pacemaker with a  left bundle lead is working normally.  Her QRS duration is 112.  We will recheck her pacemaker in several months.  She has over 10 years of battery longevity 3.  Aortic stenosis-she is status post TAVR and is doing well. 4.  Hypertension -her systolic blood pressure is well controlled.  She will maintain a low-sodium diet.  Cristopher Peru, MD

## 2019-09-03 NOTE — Patient Instructions (Signed)
Medication Instructions:  Your physician recommends that you continue on your current medications as directed. Please refer to the Current Medication list given to you today.  Labwork: None ordered.  Testing/Procedures: None ordered.  Follow-Up: Your physician wants you to follow-up in: one year with Dr. Lovena Le.   You will receive a reminder letter in the mail two months in advance. If you don't receive a letter, please call our office to schedule the follow-up appointment.  Remote monitoring is used to monitor your Pacemaker from home. This monitoring reduces the number of office visits required to check your device to one time per year. It allows [place] to keep an eye on the functioning of your device to ensure it is working properly. You are scheduled for a device check from home on 09/28/2019. You may send your transmission at any time that day. If you have a wireless device, the transmission will be sent automatically. After your physician reviews your transmission, you will receive a postcard with your next transmission date.  Any Other Special Instructions Will Be Listed Below (If Applicable).  If you need a refill on your cardiac medications before your next appointment, please call your pharmacy.

## 2019-09-07 ENCOUNTER — Encounter (HOSPITAL_BASED_OUTPATIENT_CLINIC_OR_DEPARTMENT_OTHER): Payer: Medicare Other | Attending: Internal Medicine | Admitting: Internal Medicine

## 2019-09-07 DIAGNOSIS — Z952 Presence of prosthetic heart valve: Secondary | ICD-10-CM | POA: Diagnosis not present

## 2019-09-07 DIAGNOSIS — Z950 Presence of cardiac pacemaker: Secondary | ICD-10-CM | POA: Diagnosis not present

## 2019-09-07 DIAGNOSIS — Z8673 Personal history of transient ischemic attack (TIA), and cerebral infarction without residual deficits: Secondary | ICD-10-CM | POA: Diagnosis not present

## 2019-09-07 DIAGNOSIS — B354 Tinea corporis: Secondary | ICD-10-CM | POA: Insufficient documentation

## 2019-09-07 DIAGNOSIS — I10 Essential (primary) hypertension: Secondary | ICD-10-CM | POA: Insufficient documentation

## 2019-09-07 DIAGNOSIS — L235 Allergic contact dermatitis due to other chemical products: Secondary | ICD-10-CM | POA: Insufficient documentation

## 2019-09-07 DIAGNOSIS — Z85038 Personal history of other malignant neoplasm of large intestine: Secondary | ICD-10-CM | POA: Diagnosis not present

## 2019-09-07 DIAGNOSIS — I6523 Occlusion and stenosis of bilateral carotid arteries: Secondary | ICD-10-CM | POA: Insufficient documentation

## 2019-09-07 DIAGNOSIS — Z9049 Acquired absence of other specified parts of digestive tract: Secondary | ICD-10-CM | POA: Diagnosis not present

## 2019-09-07 DIAGNOSIS — Z7901 Long term (current) use of anticoagulants: Secondary | ICD-10-CM | POA: Insufficient documentation

## 2019-09-07 DIAGNOSIS — I442 Atrioventricular block, complete: Secondary | ICD-10-CM | POA: Diagnosis not present

## 2019-09-07 DIAGNOSIS — Z87891 Personal history of nicotine dependence: Secondary | ICD-10-CM | POA: Diagnosis not present

## 2019-09-07 DIAGNOSIS — R32 Unspecified urinary incontinence: Secondary | ICD-10-CM | POA: Diagnosis not present

## 2019-09-09 NOTE — Progress Notes (Signed)
Julie Mcgee, Julie Mcgee ([id number]) Visit Report for 09/07/2019 Chief Complaint Document Details Patient Name: Date of Service: Julie Mcgee, Julie Mcgee 09/07/2019 9:00 A M Medical Record Number: [id number] Patient Account Number: [id number] Date of Birth/Sex: Treating RN: 05/07/1933 (86 y.o. Julie Mcgee Primary Care Provider: MA Julie Mcgee, [facility] [name] Other Clinician: Referring Provider: Treating Provider/Extender: [name], SHA [name] Weeks in Treatment: 0 Information Obtained from: Patient Chief Complaint 09/07/2019; patient is here for review of an area between both buttocks extending into her inner thighs and periarea Electronic Signature(s) Signed: 09/09/2019 7:39:18 AM By: [name] MD Entered By: [name] on 09/07/2019 10:52:09 -------------------------------------------------------------------------------- HPI Details Patient Name: Date of Service: Julie Mcgee 09/07/2019 9:00 [place] Record Number: [id number] Patient Account Number: [id number] Date of Birth/Sex: Treating RN: June 29, 1933 (86 y.o. Julie Mcgee Primary Care Provider: Janeann Mcgee, [facility] [name] Other Clinician: Referring Provider: Treating Provider/Extender: [name], SHA [name] Weeks in Treatment: 0 History of Present Illness HPI Description: ADMISSION 09/07/2019 This is a 86 year old woman who lives in the independent part of [place]. She apparently lives with her husband. Somewhat frail but up to now mostly independent. She was recently seen by her primary care which is equal at [facility] for a wound in the gluteal fold near her rectum. She was felt to have possible cellulitis and I believe put on Augmentin. Also noted that she has loose stools and she was put on Imodium. For the last several weeks she has had very painful areas between the cheeks of both buttocks extending into her upper medial thighs and periarea which is exceedingly painful. This is aggravated  by urinary frequency and incontinence as well as frequent BMs. She has been using calmoseptine ointment. She also uses some form of wipe/baby wipes when she is incontinent to the area. The Augmentin that she was previously prescribed does not help. She tells me that she has had previous candidal infections under both breasts but not in her periarea. Past medical history includes complete heart block status post pacemaker, aortic stenosis repaired with TAVR, history of a CVA, orthostatic hypotension, bilateral carotid stenosis, colon CA status post right hemicolectomy, right breast mass. She is on [employer]) Signed: 09/09/2019 7:39:18 AM By: [name] MD Entered By: [name] on 09/07/2019 10:54:49 -------------------------------------------------------------------------------- Physical Exam Details Patient Name: Date of Service: Julie Mcgee, Julie Mcgee [name] 09/07/2019 9:00 A M Medical Record Number: [id number] Patient Account Number: [id number] Date of Birth/Sex: Treating RN: [birth year] (86 y.o. Julie Mcgee Primary Care Provider: Janeann Mcgee, [facility] [name] Other Clinician: Referring Provider: Treating Provider/Extender: [name], SHA [name] Weeks in Treatment: 0 Constitutional Patient is hypertensive.. Pulse regular and within target range for patient.[name] Respirations regular, non-labored and within target range.. Temperature is normal and within the target range for the patient.[name] Appears in no distress. Cardiovascular She does not appear dehydrated. Gastrointestinal (GI) Slightly distended but no masses.. No liver or spleen enlargement. Genitourinary (GU) Bladder was not distended. Psychiatric Somewhat anxious woman. Notes Wound exam; the patient does technically not have a wound. However extending from her gluteal cleft including her periarea extending through her perineum into the inner part of both medial thighs and perhaps her labia she has  extensive epithelial loss. In her buttocks this is very irritated and painful. She has no satellite lesions that I can see.'s this is certainly done does not look like a bacterial infection. It  also does not look like a viral issue. As noted all of this somewhat angry with loss of surface epithelium Electronic Signature(s) Signed: 09/09/2019 7:39:18 AM By: [name] MD Entered By: [name] on 09/07/2019 10:57:30 -------------------------------------------------------------------------------- Physician Orders Details Patient Name: Date of Service: Julie Mcgee 09/07/2019 9:00 A M Medical Record Number: [id number] Patient Account Number: [id number] Date of Birth/Sex: Treating RN: [birth year] (86 y.o. Julie Mcgee Primary Care Provider: Janeann Mcgee, [facility] [name] Other Clinician: Referring Provider: Treating Provider/Extender: [name], SHA [name] Weeks in Treatment: 0 Verbal / Phone Orders: No Diagnosis Coding Follow-up Appointments Return Appointment in 1 week. Dressing Change Frequency Other: - change/apply twice a day Wound Cleansing May shower and wash wound with soap and water. Primary Wound Dressing Wound #1 Gluteal fold Other: - Antifungal and steroid cream with mix of barrier cream. The steroid and antifungal cream will be called into your pharmacy, get barrier cream over the counter. [facility] [facility] for wound care. - [facility] Patient Medications llergies: adhesive tape A Notifications Medication Indication Start End 09/07/2019 Lotrisone DOSE topical 1 %-0.05 % cream - cream topical to affected area lightly bid Notes Ensure that feces and urine remain off of skin. Wipe from front to back. Electronic Signature(s) Signed: 09/07/2019 10:58:58 AM By: [name] MD Entered By: [name] on 09/07/2019 10:58:58 -------------------------------------------------------------------------------- Problem  List Details Patient Name: Date of Service: [name], [name] [name] 09/07/2019 9:00 A M Medical Record Number: [id number] Patient Account Number: [id number] Date of Birth/Sex: Treating RN: 02-06-1933 (86 y.o. 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 Julie Mcgee Primary Care Provider: MA Julie Mcgee, [facility] Exie Parody Other Clinician: Referring Provider: Treating Provider/Extender: [name], SHA [name] Weeks in Treatment: 0 Subjective Chief Complaint Information obtained from Patient 09/07/2019; patient is here for review of an area between both buttocks extending into her inner thighs and periarea History of Present Illness (HPI) ADMISSION 09/07/2019 This is a 86 year old woman who lives in the independent part of [place]. She apparently lives with her husband. Somewhat frail but up to now mostly independent. She was recently seen by her primary care which is equal at [facility] for a wound in the gluteal fold near her rectum. She was felt to have possible cellulitis and I believe put on Augmentin. Also noted that she has loose stools and she was put on Imodium. For the last several  weeks she has had very painful areas between the cheeks of both buttocks extending into her upper medial thighs and periarea which is exceedingly painful. This is aggravated by urinary frequency and incontinence as well as frequent BMs. She has been using calmoseptine ointment. She also uses some form of wipe/baby wipes when she is incontinent to the area. The Augmentin that she was previously prescribed does not help. She tells  me that she has had previous candidal infections under both breasts but not in her periarea. Past medical history includes complete heart block status post pacemaker, aortic stenosis repaired with TAVR, history of a CVA, orthostatic hypotension, bilateral carotid stenosis, colon CA status post right hemicolectomy, right breast mass. She is on Eliquis Patient History Information obtained from Patient. Allergies adhesive tape Family History Heart Disease - Mother, Kidney Disease - Mother, No family history of Cancer, Diabetes, Hereditary Spherocytosis, Hypertension, Lung Disease, Seizures, Stroke, Thyroid Problems, Tuberculosis. Social History Former smoker, Marital Status - Married, Alcohol Use - Never, Drug Use - No History, Caffeine Use - Rarely. Medical History Eyes Denies history of Cataracts, Glaucoma, Optic Neuritis Ear/Nose/Mouth/Throat Denies history of Chronic sinus problems/congestion, Middle ear problems Hematologic/Lymphatic Denies history of Anemia, Hemophilia, Human Immunodeficiency Virus, Lymphedema, Sickle Cell Disease Respiratory Denies history of Aspiration, Asthma, Chronic Obstructive Pulmonary Disease (COPD), Pneumothorax, Sleep Apnea, Tuberculosis Cardiovascular Patient has history of Hypertension, Peripheral Venous Disease Denies history of Angina, Arrhythmia, Congestive Heart Failure, Coronary Artery Disease, Deep Vein Thrombosis, Hypotension, Myocardial Infarction, Peripheral Arterial Disease, Phlebitis, Vasculitis Gastrointestinal Denies  history of Cirrhosis , Colitis, Crohnoos, Hepatitis A, Hepatitis B, Hepatitis C Endocrine Denies history of Type I Diabetes, Type II Diabetes Genitourinary Denies history of End Stage Renal Disease Immunological Denies history of Lupus Erythematosus, Raynaudoos, Scleroderma Integumentary (Skin) Denies history of History of Burn Musculoskeletal Denies history of Gout, Rheumatoid Arthritis, Osteoarthritis, Osteomyelitis Neurologic Denies history of Dementia, Neuropathy, Quadriplegia, Paraplegia, Seizure Disorder Oncologic Denies history of Received Chemotherapy, Received Radiation Psychiatric Denies history of Anorexia/bulimia, Confinement Anxiety Review of Systems (ROS) Constitutional Symptoms (General Health) Denies complaints or symptoms of Fatigue, Fever, Chills, Marked Weight Change. Eyes Complains or has symptoms of Glasses / Contacts. Denies complaints or symptoms of Dry Eyes, Vision Changes. Ear/Nose/Mouth/Throat Denies complaints or symptoms of Chronic sinus problems or rhinitis. Respiratory Denies complaints or symptoms of Chronic or frequent coughs, Shortness of Breath. Cardiovascular Denies complaints or symptoms of Chest pain. Gastrointestinal Denies complaints or symptoms of Frequent diarrhea, Nausea, Vomiting. Endocrine Denies complaints or symptoms of Heat/cold intolerance. Genitourinary Denies complaints or symptoms of Frequent urination. Integumentary (Skin) Complains or has symptoms of Wounds. Musculoskeletal Denies complaints or symptoms of Muscle Pain, Muscle Weakness. Neurologic Denies complaints or symptoms of Numbness/parasthesias. Psychiatric Denies complaints or symptoms of Claustrophobia, Suicidal. Objective Constitutional Patient is hypertensive.. Pulse regular and within target range for patient.[name] Respirations regular, non-labored and within target range.. Temperature is normal and within the target range for the patient.[name] Appears in no  distress. Vitals Time Taken: 9:20 AM, Height: 62 in, Source: Stated, Weight: 188 lbs, BMI: 34.4, Temperature: 97.8 F, Pulse: 69 bpm, Respiratory Rate: 18 breaths/min, Blood Pressure: 165/72 mmHg. Cardiovascular She does not appear dehydrated. Gastrointestinal (GI) Slightly distended but no masses.. No liver or spleen enlargement. Genitourinary (GU) Bladder was not distended. Psychiatric Somewhat anxious woman. General Notes: Wound exam; the patient does technically not have a wound. However extending from her gluteal cleft including her periarea extending through her perineum into the inner part of both medial thighs and perhaps her labia she has extensive epithelial loss. In her buttocks this is very irritated and painful. She has no satellite lesions that I can see.'s this is certainly done does not look like a bacterial infection. It also does not look like a viral issue. As noted all of this somewhat angry with loss of surface epithelium Integumentary (Hair, Skin) Wound #1 status is Open. Original cause of wound was Gradually Appeared. The  wound is located on the Gluteal fold. The wound measures 13cm length x 5cm width x 0.1cm depth; 51.051cm^2 area and 5.105cm^3 volume. There is Fat Layer (Subcutaneous Tissue) exposed. There is no tunneling or undermining noted. There is a medium amount of serous drainage noted. There is medium (34-66%) pink granulation within the wound bed. There is a medium (34-66%) amount of necrotic tissue within the wound bed including Adherent Slough. 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 Dressing Change Frequency: Other: - change/apply twice a day Wound Cleansing: May shower and wash wound with soap and water. Primary Wound Dressing: Wound #1 Gluteal fold: Other: - Antifungal and steroid cream with mix of barrier cream. The  steroid and antifungal cream will be called into your pharmacy, get barrier cream over the counter. Home Health: [place] skilled [facility] for wound care. - [facility] The following medication(s) was prescribed: Lotrisone topical 1 %-0.05 % cream cream topical to affected area lightly bid starting 09/07/2019 General Notes: Ensure that feces and urine remain off of skin. Wipe from front to back. 1. I am honestly not sure what is the primary pathology here. I do not believe that this looks like a bacterial infection. This could be a extensive fungal issue although I have seen more classic presentations than this. It does not look to be viral 2. The other major differential would be some form of chronic contact dermatitis from the calmoseptine and/or the wife she is using. Addition of chronic urinary incontinence and fecal incontinence would certainly add to the issue. 3. I am going to give her a prescription for Lotrisone cream to be used twice daily to the area I would like this covered with zinc oxide twice a day. I have asked her to stop the calmoseptine and any of the other wipes she is using in case this is some form of chronic irritant dermatitis to mucosal areas. 4. If this does not help her I am not exactly sure what to do other than to perhaps refer her to dermatology. 5. I will see her back next week I spent 35 minutes in review of this patient's past medical history face-to-face evaluation and preparation of this record Electronic Signature(s) Signed: 09/09/2019 7:39:18 AM By: [name] MD Entered By: [name] on 09/07/2019 11:01:56 -------------------------------------------------------------------------------- HxROS Details Patient Name: Date of Service: Julie Mcgee 09/07/2019 9:00 A M Medical Record Number: [id number] Patient Account Number: [id number] Date of Birth/Sex: Treating RN: [birth year] (86 y.o. Julie Mcgee Primary Care Provider: MA Julie Mcgee, [facility]  Exie Parody Other Clinician: Referring Provider: Treating Provider/Extender: [name], SHA [name] Weeks in Treatment: 0 Information Obtained From Patient Constitutional Symptoms (General Health) Complaints and Symptoms: Negative for: Fatigue; Fever; Chills; Marked Weight Change Eyes Complaints and Symptoms: Positive for: Glasses / Contacts Negative for: Dry Eyes; Vision Changes Medical History: Negative for: Cataracts; Glaucoma; Optic Neuritis Ear/Nose/Mouth/Throat Complaints and Symptoms: Negative for: Chronic sinus problems or rhinitis Medical History: Negative for: Chronic sinus problems/congestion; Middle ear problems Respiratory Complaints and Symptoms: Negative for: Chronic or frequent coughs; Shortness of Breath Medical History: Negative for: Aspiration; Asthma; Chronic Obstructive Pulmonary Disease (COPD); Pneumothorax; Sleep Apnea; Tuberculosis Cardiovascular Complaints and Symptoms: Negative for: Chest pain Medical History: Positive for: Hypertension; Peripheral Venous Disease Negative for: Angina; Arrhythmia; Congestive Heart Failure; Coronary Artery Disease; Deep Vein Thrombosis; Hypotension; Myocardial Infarction; Peripheral Arterial Disease; Phlebitis; Vasculitis Gastrointestinal Complaints  and Symptoms: Negative for: Frequent diarrhea; Nausea; Vomiting Medical History: Negative for: Cirrhosis ; Colitis; Crohns; Hepatitis A; Hepatitis B; Hepatitis C Endocrine Complaints and Symptoms: Negative for: Heat/cold intolerance Medical History: Negative for: Type I Diabetes; Type II Diabetes Genitourinary Complaints and Symptoms: Negative for: Frequent urination Medical History: Negative for: End Stage Renal Disease Integumentary (Skin) Complaints and Symptoms: Positive for: Wounds Medical History: Negative for: History of Burn Musculoskeletal Complaints and Symptoms: Negative for: Muscle Pain; Muscle Weakness Medical History: Negative for: Gout;  Rheumatoid Arthritis; Osteoarthritis; Osteomyelitis Neurologic Complaints and Symptoms: Negative for: Numbness/parasthesias Medical History: Negative for: Dementia; Neuropathy; Quadriplegia; Paraplegia; Seizure Disorder Psychiatric Complaints and Symptoms: Negative for: Claustrophobia; Suicidal Medical History: Negative for: Anorexia/bulimia; Confinement Anxiety Hematologic/Lymphatic Medical History: Negative for: Anemia; Hemophilia; Human Immunodeficiency Virus; Lymphedema; Sickle Cell Disease Immunological Medical History: Negative for: Lupus Erythematosus; Raynauds; Scleroderma Oncologic Medical History: Negative for: Received Chemotherapy; Received Radiation Immunizations Pneumococcal Vaccine: Received Pneumococcal Vaccination: No Implantable Devices None Family and Social History Cancer: No; Diabetes: No; Heart Disease: Yes - Mother; Hereditary Spherocytosis: No; Hypertension: No; Kidney Disease: Yes - Mother; Lung Disease: No; Seizures: No; Stroke: No; Thyroid Problems: No; Tuberculosis: No; Former smoker; Marital Status - Married; Alcohol Use: Never; Drug Use: No History; Caffeine Use: Rarely; Financial Concerns: No; Food, Clothing or Shelter Needs: No; Support System Lacking: No; Transportation Concerns: No Electronic Signature(s) Signed: 09/07/2019 5:49:17 PM By: [name] Signed: 09/09/2019 7:39:18 AM By: [name] MD Entered By: [name] on 09/07/2019 09:25:47 -------------------------------------------------------------------------------- SuperBill Details Patient Name: Date of Service: Julie Mcgee, Julie Mcgee 09/07/2019 Medical Record Number: [id number] Patient Account Number: [id number] Date of Birth/Sex: Treating RN: Dec 19, 1933 (86 y.o. 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

## 2019-09-09 NOTE — Progress Notes (Signed)
Julie Mcgee, Julie Mcgee ([id number]) Visit Report for 09/07/2019 Allergy List Details Patient Name: Date of Service: [name], [name] 09/07/2019 9:00 A M Medical Record Number: [id number] Patient Account Number: [id number] Date of Birth/Sex: Treating RN: 1933/08/09 (86 y.o. [name] Primary Care Toinette Lackie: [name], [facility] NNO N Other Clinician: Referring Eliot Popper: Treating Haasini Patnaude/Extender: [name], [name] Weeks in Treatment: 0 Allergies Active Allergies adhesive tape Allergy Notes Electronic Signature(s) Signed: 09/07/2019 5:49:17 PM By: [name] Entered By: [name] on 09/07/2019 09:21:21 -------------------------------------------------------------------------------- Arrival Information Details Patient Name: Date of Service: Julie Mcgee RO LA N 09/07/2019 9:00 A M Medical Record Number: [id number] Patient Account Number: [id number] Date of Birth/Sex: Treating RN: [birth year] (86 y.o. [name] Primary Care Delonte Musich: [name], [facility] NNO N Other Clinician: Referring Aleja Yearwood: Treating Sameria Morss/Extender: [name], SHA Exie Parody Weeks in Treatment: 0 Visit Information Patient Arrived: Wheel Chair Arrival Time: 09:08 Accompanied By: son Transfer Assistance: None Patient Identification Verified: Yes Secondary Verification Process Completed: Yes Patient Has Alerts: Yes Patient Alerts: Patient on Blood Thinner Electronic Signature(s) Signed: 09/07/2019 5:49:17 PM By: [name] Entered By: [name] on 09/07/2019 09:20:38 -------------------------------------------------------------------------------- Clinic Level of Care Assessment Details Patient Name: Date of Service: Julie Mcgee 09/07/2019 9:00 [place] Record Number: [id number] Patient Account Number: [id number] Date of Birth/Sex: Treating RN: 1933-05-03 (86 y.o. Clearnce Sorrel Primary Care Jammy Stlouis: [name], [facility] Exie Parody Other Clinician: Referring  Rashanda Magloire: Treating Averie Hornbaker/Extender: [name], [name] Weeks in Treatment: 0 Clinic Level of Care Assessment Items TOOL 2 Quantity Score X- 1 0 Use when only an EandM is performed on the INITIAL visit ASSESSMENTS - Nursing Assessment / Reassessment X- 1 20 General Physical Exam (combine w/ comprehensive assessment (listed just below) when performed on new pt. evals) X- 1 25 Comprehensive Assessment (HX, ROS, Risk Assessments, Wounds Hx, etc.) ASSESSMENTS - Wound and Skin A ssessment / Reassessment X - Simple Wound Assessment / Reassessment - one wound 1 5 []  - 0 Complex Wound Assessment / Reassessment - multiple wounds X- 1 10 Dermatologic / Skin Assessment (not related to wound area) ASSESSMENTS - Ostomy and/or Continence Assessment and Care []  - 0 Incontinence Assessment and Management []  - 0 Ostomy Care Assessment and Management (repouching, etc.) PROCESS - Coordination of Care X - Simple Patient / Family Education for ongoing care 1 15 []  - 0 Complex (extensive) Patient / Family Education for ongoing care X- 1 10 Staff obtains Programmer, systems, Records, T Results / Process Orders est X- 1 10 Staff telephones HHA, Nursing Homes / Clarify orders / etc []  - 0 Routine Transfer to another Facility (non-emergent condition) []  - 0 Routine Hospital Admission (non-emergent condition) X- 1 15 New Admissions / Biomedical engineer / Ordering NPWT Apligraf, etc. , []  - 0 Emergency Hospital Admission (emergent condition) X- 1 10 Simple Discharge Coordination []  - 0 Complex (extensive) Discharge Coordination PROCESS - Special Needs []  - 0 Pediatric / Minor Patient Management []  - 0 Isolation Patient Management []  - 0 Hearing / Language / Visual special needs []  - 0 Assessment of Community assistance (transportation, D/C planning, etc.) []  - 0 Additional assistance / Altered mentation []  - 0 Support Surface(s) Assessment (bed, cushion, seat,  etc.) INTERVENTIONS - Wound Cleansing / Measurement X- 1 5 Wound Imaging (photographs - any number of wounds) []  - 0 Wound Tracing (instead of photographs) X- 1 5 Simple Wound Measurement - one  wound []  - 0 Complex Wound Measurement - multiple wounds X- 1 5 Simple Wound Cleansing - one wound []  - 0 Complex Wound Cleansing - multiple wounds INTERVENTIONS - Wound Dressings X - Small Wound Dressing one or multiple wounds 1 10 []  - 0 Medium Wound Dressing one or multiple wounds []  - 0 Large Wound Dressing one or multiple wounds []  - 0 Application of Medications - injection INTERVENTIONS - Miscellaneous []  - 0 External ear exam []  - 0 Specimen Collection (cultures, biopsies, blood, body fluids, etc.) []  - 0 Specimen(s) / Culture(s) sent or taken to Lab for analysis []  - 0 Patient Transfer (multiple staff / Civil Service fast streamer / Similar devices) []  - 0 Simple Staple / Suture removal (25 or less) []  - 0 Complex Staple / Suture removal (26 or more) []  - 0 Hypo / Hyperglycemic Management (close monitor of Blood Glucose) []  - 0 Ankle / Brachial Index (ABI) - do not check if billed separately Has the patient been seen at the hospital within the last three years: Yes Total Score: 145 Level Of Care: New/Established - Level 4 Electronic Signature(s) Signed: 09/07/2019 5:56:33 PM By: Kela Millin Entered By: Kela Millin on 09/07/2019 10:27:51 -------------------------------------------------------------------------------- Encounter Discharge Information Details Patient Name: Date of Service: Julie Mcgee RO LA N 09/07/2019 9:00 A M Medical Record Number: [id number] Patient Account Number: [id number] Date of Birth/Sex: Treating RN: [birth year] (86 y.o. Julie Mcgee Primary Care Eleanore Junio: [name], [facility] NNO N Other Clinician: Referring Yafet Cline: Treating Bracken Moffa/Extender: [name], [name] Weeks in Treatment: 0 Encounter Discharge Information Items Discharge  Condition: Stable Ambulatory Status: Wheelchair Discharge Destination: Home Transportation: Private Auto Accompanied By: son Schedule Follow-up Appointment: Yes Clinical Summary of Care: Patient Declined Electronic Signature(s) Signed: 09/07/2019 6:01:16 PM By: Levan Hurst RN, BSN Entered By: Levan Hurst on 09/07/2019 11:56:20 -------------------------------------------------------------------------------- Multi Wound Chart Details Patient Name: Date of Service: Julie Mcgee RO LA N 09/07/2019 9:00 [place] Record Number: [id number] Patient Account Number: [id number] Date of Birth/Sex: Treating RN: [birth year] (86 y.o. F) Kela Millin Primary Care Kellee Sittner: [name], [facility] NNO N Other Clinician: Referring Hymen Arnett: Treating Ankur Snowdon/Extender: [name], [name] Weeks in Treatment: 0 Vital Signs Height(in): 62 Pulse(bpm): 69 Weight(lbs): 188 Blood Pressure(mmHg): 165/72 Body Mass Index(BMI): 34 Temperature(F): 97.8 Respiratory Rate(breaths/min): 18 Photos: [1:No Photos Gluteal fold] [N/A:N/A N/A] Wound Location: [1:Gradually Appeared] [N/A:N/A] Wounding Event: [1:Fungal] [N/A:N/A] Primary Etiology: [1:Hypertension, Peripheral Venous] [N/A:N/A] Comorbid History: [1:Disease 08/06/2019] [N/A:N/A] Date Acquired: [1:0] [N/A:N/A] Weeks of Treatment: [1:Open] [N/A:N/A] Wound Status: [1:13x5x0.1] [N/A:N/A] Measurements L x W x D (cm) [1:51.051] [N/A:N/A] A (cm) : rea [1:5.105] [N/A:N/A] Volume (cm) : [1:Full Thickness Without Exposed] [N/A:N/A] Classification: [1:Support Structures Medium] [N/A:N/A] Exudate Amount: [1:Serous] [N/A:N/A] Exudate Type: [1:amber] [N/A:N/A] Exudate Color: [1:Medium (34-66%)] [N/A:N/A] Granulation Amount: [1:Pink] [N/A:N/A] Granulation Quality: [1:Medium (34-66%)] [N/A:N/A] Necrotic Amount: [1:Fat Layer (Subcutaneous Tissue): Yes N/A] Exposed Structures: [1:Fascia: No Tendon: No Muscle: No Joint: No Bone: No None]  [N/A:N/A] Treatment Notes Electronic Signature(s) Signed: 09/07/2019 5:56:33 PM By: Kela Millin Signed: 09/09/2019 7:39:18 AM By: [name] MD Entered By: [name] on 09/07/2019 10:41:19 -------------------------------------------------------------------------------- Multi-Disciplinary Care Plan Details Patient Name: Date of Service: Julie Mcgee RO LA N 09/07/2019 9:00 A M Medical Record Number: [id number] Patient Account Number: [id number] Date of Birth/Sex: Treating RN: 1933/10/24 (86 y.o. Clearnce Sorrel Primary Care Fronnie Urton: [name], [facility] NNO N Other Clinician: Referring Raimundo Corbit: Treating Coty Student/Extender: [name], [name] Weeks in  Treatment: 0 Active Inactive Orientation to the Wound Care Program Nursing Diagnoses: Knowledge deficit related to the wound healing center program Goals: Patient/caregiver will verbalize understanding of the [place] Date Initiated: 09/07/2019 Target Resolution Date: 09/28/2019 Goal Status: Active Interventions: Provide education on orientation to the wound center Notes: Pain, Acute or Chronic Nursing Diagnoses: Pain, acute or chronic: actual or potential Goals: Patient/caregiver will verbalize adequate pain control between visits Date Initiated: 09/07/2019 Target Resolution Date: 09/28/2019 Goal Status: Active Interventions: Provide education on pain management Notes: Wound/Skin Impairment Nursing Diagnoses: Impaired tissue integrity Goals: Ulcer/skin breakdown will have a volume reduction of 30% by week 4 Date Initiated: 09/07/2019 Target Resolution Date: 09/28/2019 Goal Status: Active Interventions: Provide education on ulcer and skin care Notes: Electronic Signature(s) Signed: 09/07/2019 5:56:33 PM By: Kela Millin Entered By: Kela Millin on 09/07/2019 10:24:58 -------------------------------------------------------------------------------- Pain Assessment  Details Patient Name: Date of Service: BREANDA, GREENLAW RO LA N 09/07/2019 9:00 A M Medical Record Number: [id number] Patient Account Number: [id number] Date of Birth/Sex: Treating RN: 1934-01-07 (86 y.o. [name] Primary Care Glyn Zendejas: [name], [facility] Exie Parody Other Clinician: Referring Brailey Buescher: Treating Kyvon Hu/Extender: [name], [name] Weeks in Treatment: 0 Active Problems Location of Pain Severity and Description of Pain Patient Has Paino Yes Site Locations With Dressing Change: Yes Rate the pain. Current Pain Level: 7 Worst Pain Level: 9 Least Pain Level: 1 Tolerable Pain Level: 0 Character of Pain Describe the Pain: Aching, Burning Pain Management and Medication Current Pain Management: Medication: Yes Cold Application: No Rest: Yes Massage: No Activity: No T.E.N.S.: No Heat Application: No Leg drop or elevation: No Is the Current Pain Management Adequate: Inadequate How does your wound impact your activities of daily livingo Sleep: Yes Bathing: No Appetite: No Relationship With Others: No Bladder Continence: No Emotions: No Bowel Continence: No Work: No Toileting: No Drive: No Dressing: No Hobbies: No Electronic Signature(s) Signed: 09/07/2019 5:49:17 PM By: [name] Entered By: [name] on 09/07/2019 09:42:59 -------------------------------------------------------------------------------- Patient/Caregiver Education Details Patient Name: Date of Service: Randie Heinz 8/27/2021andnbsp9:00 A M Medical Record Number: [id number] Patient Account Number: [id number] Date of Birth/Gender: Treating RN: 03-Aug-1933 (86 y.o. Clearnce Sorrel Primary Care Physician: [name], [facility] NNO N Other Clinician: Referring Physician: Treating Physician/Extender: [name], SHA Exie Parody Weeks in Treatment: 0 Education Assessment Education Provided To: Patient Education Topics Provided Pain: Handouts: A Guide to Pain  Control Methods: Explain/Verbal Responses: State content correctly [place]: o Handouts: Welcome T [facility] [place] o Methods: Explain/Verbal Responses: State content correctly Wound/Skin Impairment: Handouts: Caring for Your Ulcer Methods: Explain/Verbal Responses: State content correctly Electronic Signature(s) Signed: 09/07/2019 5:56:33 PM By: Kela Millin Entered By: Kela Millin on 09/07/2019 10:25:16 -------------------------------------------------------------------------------- Wound Assessment Details Patient Name: Date of Service: FLAVIA, BRUSS RO LA N 09/07/2019 9:00 A M Medical Record Number: [id number] Patient Account Number: [id number] Date of Birth/Sex: Treating RN: [birth year] (86 y.o. [name] Primary Care Ashunti Schofield: [name], [facility] NNO N Other Clinician: Referring Marv Alfrey: Treating Kyre Jeffries/Extender: [name], [name] Weeks in Treatment: 0 Wound Status Wound Number: 1 Primary Etiology: Fungal Wound Location: Gluteal fold Wound Status: Open Wounding Event: Gradually Appeared Comorbid History: Hypertension, Peripheral Venous Disease Date Acquired: 08/06/2019 Weeks Of Treatment: 0 Clustered Wound: No Wound Measurements Length: (cm) 13 Width: (cm) 5 Depth: (cm) 0.1 Area: (cm) 51.051 Volume: (cm) 5.105 % Reduction in Area: % Reduction  in Volume: Epithelialization: None Tunneling: No Undermining: No Wound Description Classification: Full Thickness Without Exposed Support Structures Exudate Amount: Medium Exudate Type: Serous Exudate Color: amber Foul Odor After Cleansing: No Slough/Fibrino Yes Wound Bed Granulation Amount: Medium (34-66%) Exposed Structure Granulation Quality: Pink Fascia Exposed: No Necrotic Amount: Medium (34-66%) Fat Layer (Subcutaneous Tissue) Exposed: Yes Necrotic Quality: Adherent Slough Tendon Exposed: No Muscle Exposed: No Joint Exposed: No Bone Exposed:  No Treatment Notes Wound #1 (Gluteal fold) 1. Cleanse With Wound Cleanser 3. Primary Dressing Applied Other primary dressing (specifiy in notes) Notes Ketoconazole, TCA, barrier cream mix Electronic Signature(s) Signed: 09/07/2019 5:49:17 PM By: [name] Entered By: [name] on 09/07/2019 09:42:00 -------------------------------------------------------------------------------- Vitals Details Patient Name: Date of Service: Julie Mcgee RO LA N 09/07/2019 9:00 [place] Record Number: [id number] Patient Account Number: [id number] Date of Birth/Sex: Treating RN: 04-05-1933 (86 y.o. [name] Primary Care Stpehanie Montroy: [name], [facility] NNO N Other Clinician: Referring Seona Clemenson: Treating Juline Sanderford/Extender: [name], [name] Weeks in Treatment: 0 Vital Signs Time Taken: 09:20 Temperature (F): 97.8 Height (in): 62 Pulse (bpm): 69 Source: Stated Respiratory Rate (breaths/min): 18 Weight (lbs): 188 Blood Pressure (mmHg): 165/72 Body Mass Index (BMI): 34.4 Reference Range: 80 - 120 mg / dl Electronic Signature(s) Signed: 09/07/2019 5:49:17 PM By: [name] Entered By: [name] on 09/07/2019 09:21:11

## 2019-09-10 ENCOUNTER — Encounter (HOSPITAL_BASED_OUTPATIENT_CLINIC_OR_DEPARTMENT_OTHER): Payer: Medicare Other | Admitting: Internal Medicine

## 2019-09-10 DIAGNOSIS — E7800 Pure hypercholesterolemia, unspecified: Secondary | ICD-10-CM | POA: Diagnosis not present

## 2019-09-10 DIAGNOSIS — E039 Hypothyroidism, unspecified: Secondary | ICD-10-CM | POA: Diagnosis not present

## 2019-09-10 DIAGNOSIS — I10 Essential (primary) hypertension: Secondary | ICD-10-CM | POA: Diagnosis not present

## 2019-09-10 DIAGNOSIS — C189 Malignant neoplasm of colon, unspecified: Secondary | ICD-10-CM | POA: Diagnosis not present

## 2019-09-10 DIAGNOSIS — I639 Cerebral infarction, unspecified: Secondary | ICD-10-CM | POA: Diagnosis not present

## 2019-09-10 DIAGNOSIS — I2510 Atherosclerotic heart disease of native coronary artery without angina pectoris: Secondary | ICD-10-CM | POA: Diagnosis not present

## 2019-09-10 DIAGNOSIS — I480 Paroxysmal atrial fibrillation: Secondary | ICD-10-CM | POA: Diagnosis not present

## 2019-09-10 DIAGNOSIS — I5032 Chronic diastolic (congestive) heart failure: Secondary | ICD-10-CM | POA: Diagnosis not present

## 2019-09-11 ENCOUNTER — Encounter (HOSPITAL_BASED_OUTPATIENT_CLINIC_OR_DEPARTMENT_OTHER): Payer: Medicare Other | Admitting: Internal Medicine

## 2019-09-12 DIAGNOSIS — I69331 Monoplegia of upper limb following cerebral infarction affecting right dominant side: Secondary | ICD-10-CM | POA: Diagnosis not present

## 2019-09-12 DIAGNOSIS — K591 Functional diarrhea: Secondary | ICD-10-CM | POA: Diagnosis not present

## 2019-09-12 DIAGNOSIS — I130 Hypertensive heart and chronic kidney disease with heart failure and stage 1 through stage 4 chronic kidney disease, or unspecified chronic kidney disease: Secondary | ICD-10-CM | POA: Diagnosis not present

## 2019-09-12 DIAGNOSIS — L03317 Cellulitis of buttock: Secondary | ICD-10-CM | POA: Diagnosis not present

## 2019-09-12 DIAGNOSIS — L89151 Pressure ulcer of sacral region, stage 1: Secondary | ICD-10-CM | POA: Diagnosis not present

## 2019-09-12 DIAGNOSIS — I5032 Chronic diastolic (congestive) heart failure: Secondary | ICD-10-CM | POA: Diagnosis not present

## 2019-09-14 DIAGNOSIS — K591 Functional diarrhea: Secondary | ICD-10-CM | POA: Diagnosis not present

## 2019-09-14 DIAGNOSIS — I69331 Monoplegia of upper limb following cerebral infarction affecting right dominant side: Secondary | ICD-10-CM | POA: Diagnosis not present

## 2019-09-14 DIAGNOSIS — I130 Hypertensive heart and chronic kidney disease with heart failure and stage 1 through stage 4 chronic kidney disease, or unspecified chronic kidney disease: Secondary | ICD-10-CM | POA: Diagnosis not present

## 2019-09-14 DIAGNOSIS — L89151 Pressure ulcer of sacral region, stage 1: Secondary | ICD-10-CM | POA: Diagnosis not present

## 2019-09-14 DIAGNOSIS — I5032 Chronic diastolic (congestive) heart failure: Secondary | ICD-10-CM | POA: Diagnosis not present

## 2019-09-14 DIAGNOSIS — L03317 Cellulitis of buttock: Secondary | ICD-10-CM | POA: Diagnosis not present

## 2019-09-18 ENCOUNTER — Other Ambulatory Visit: Payer: Self-pay

## 2019-09-18 ENCOUNTER — Encounter (HOSPITAL_BASED_OUTPATIENT_CLINIC_OR_DEPARTMENT_OTHER): Payer: Medicare Other | Attending: Internal Medicine | Admitting: Internal Medicine

## 2019-09-18 DIAGNOSIS — Z9049 Acquired absence of other specified parts of digestive tract: Secondary | ICD-10-CM | POA: Insufficient documentation

## 2019-09-18 DIAGNOSIS — Z950 Presence of cardiac pacemaker: Secondary | ICD-10-CM | POA: Insufficient documentation

## 2019-09-18 DIAGNOSIS — I350 Nonrheumatic aortic (valve) stenosis: Secondary | ICD-10-CM | POA: Insufficient documentation

## 2019-09-18 DIAGNOSIS — L235 Allergic contact dermatitis due to other chemical products: Secondary | ICD-10-CM | POA: Insufficient documentation

## 2019-09-18 DIAGNOSIS — I10 Essential (primary) hypertension: Secondary | ICD-10-CM | POA: Diagnosis not present

## 2019-09-18 DIAGNOSIS — B354 Tinea corporis: Secondary | ICD-10-CM | POA: Diagnosis not present

## 2019-09-18 DIAGNOSIS — R350 Frequency of micturition: Secondary | ICD-10-CM | POA: Diagnosis not present

## 2019-09-18 DIAGNOSIS — Z8673 Personal history of transient ischemic attack (TIA), and cerebral infarction without residual deficits: Secondary | ICD-10-CM | POA: Insufficient documentation

## 2019-09-18 DIAGNOSIS — Z85038 Personal history of other malignant neoplasm of large intestine: Secondary | ICD-10-CM | POA: Diagnosis not present

## 2019-09-18 DIAGNOSIS — R54 Age-related physical debility: Secondary | ICD-10-CM | POA: Diagnosis not present

## 2019-09-18 DIAGNOSIS — Z7901 Long term (current) use of anticoagulants: Secondary | ICD-10-CM | POA: Diagnosis not present

## 2019-09-19 DIAGNOSIS — I69331 Monoplegia of upper limb following cerebral infarction affecting right dominant side: Secondary | ICD-10-CM | POA: Diagnosis not present

## 2019-09-19 DIAGNOSIS — K591 Functional diarrhea: Secondary | ICD-10-CM | POA: Diagnosis not present

## 2019-09-19 DIAGNOSIS — I130 Hypertensive heart and chronic kidney disease with heart failure and stage 1 through stage 4 chronic kidney disease, or unspecified chronic kidney disease: Secondary | ICD-10-CM | POA: Diagnosis not present

## 2019-09-19 DIAGNOSIS — L89151 Pressure ulcer of sacral region, stage 1: Secondary | ICD-10-CM | POA: Diagnosis not present

## 2019-09-19 DIAGNOSIS — I5032 Chronic diastolic (congestive) heart failure: Secondary | ICD-10-CM | POA: Diagnosis not present

## 2019-09-19 DIAGNOSIS — L03317 Cellulitis of buttock: Secondary | ICD-10-CM | POA: Diagnosis not present

## 2019-09-19 NOTE — Progress Notes (Signed)
ZOSIA, LUCCHESE ([id number]) Visit Report for 09/18/2019 Arrival Information Details Patient Name: Date of Service: Julie Mcgee, Julie Mcgee 09/18/2019 1:00 PM Medical Record Number: [id number] Patient Account Number: [id number] Date of Birth/Sex: Treating RN: 01-Mar-1933 (86 y.o. [name] Primary Care Kylan Veach: [name], [facility] Exie Parody Other Clinician: Referring Monicia Tse: Treating Daylynn Stumpp/Extender: [name], [facility] [name] Weeks in Treatment: 1 Visit Information History Since Last Visit Added or deleted any medications: No Patient Arrived: Ambulatory Any new allergies or adverse reactions: No Arrival Time: 13:15 Had a fall or experienced change in No Accompanied By: daughter-in-law activities of daily living that may affect Transfer Assistance: None risk of falls: Patient Identification Verified: Yes Signs or symptoms of abuse/neglect since last visito No Secondary Verification Process Completed: Yes Hospitalized since last visit: No Patient Has Alerts: Yes Implantable device outside of the clinic excluding No Patient Alerts: Patient on Blood Thinner cellular tissue based products placed in the center since last visit: Has Dressing in Place as Prescribed: Yes Pain Present Now: No Electronic Signature(s) Signed: 09/18/2019 5:32:29 PM By: Levan Hurst RN, BSN Entered By: Levan Hurst on 09/18/2019 13:16:14 -------------------------------------------------------------------------------- Clinic Level of Care Assessment Details Patient Name: Date of Service: Julie Mcgee, Julie Mcgee 09/18/2019 1:00 PM Medical Record Number: [id number] Patient Account Number: [id number] Date of Birth/Sex: Treating RN: 10/22/1933 (86 y.o. [name] Primary Care Coben Godshall: [name], [facility] [name] Other Clinician: Referring Kee Drudge: Treating Eldrick Penick/Extender: [name], SHA [name] Weeks in Treatment: 1 Clinic Level of Care Assessment Items TOOL 4 Quantity Score []  - 0 Use when only  an EandM is performed on FOLLOW-UP visit ASSESSMENTS - Nursing Assessment / Reassessment X- 1 10 Reassessment of Co-morbidities (includes updates in patient status) X- 1 5 Reassessment of Adherence to Treatment Plan ASSESSMENTS - Wound and Skin A ssessment / Reassessment X - Simple Wound Assessment / Reassessment - one wound 1 5 []  - 0 Complex Wound Assessment / Reassessment - multiple wounds []  - 0 Dermatologic / Skin Assessment (not related to wound area) ASSESSMENTS - Focused Assessment []  - 0 Circumferential Edema Measurements - multi extremities []  - 0 Nutritional Assessment / Counseling / Intervention []  - 0 Lower Extremity Assessment (monofilament, tuning fork, pulses) []  - 0 Peripheral Arterial Disease Assessment (using hand held doppler) ASSESSMENTS - Ostomy and/or Continence Assessment and Care []  - 0 Incontinence Assessment and Management []  - 0 Ostomy Care Assessment and Management (repouching, etc.) PROCESS - Coordination of Care X - Simple Patient / Family Education for ongoing care 1 15 []  - 0 Complex (extensive) Patient / Family Education for ongoing care X- 1 10 Staff obtains Programmer, systems, Records, T Results / Process Orders est []  - 0 Staff telephones HHA, Nursing Homes / Clarify orders / etc []  - 0 Routine Transfer to another Facility (non-emergent condition) []  - 0 Routine Hospital Admission (non-emergent condition) []  - 0 New Admissions / Biomedical engineer / Ordering NPWT Apligraf, etc. , []  - 0 Emergency Hospital Admission (emergent condition) X- 1 10 Simple Discharge Coordination []  - 0 Complex (extensive) Discharge Coordination PROCESS - Special Needs []  - 0 Pediatric / Minor Patient Management []  - 0 Isolation Patient Management []  - 0 Hearing / Language / Visual special needs []  - 0 Assessment of Community assistance (transportation, D/C planning, etc.) []  - 0 Additional assistance / Altered mentation []  - 0 Support Surface(s)  Assessment (bed, cushion, seat, etc.) INTERVENTIONS - Wound Cleansing / Measurement X - Simple Wound Cleansing -  one wound 1 5 []  - 0 Complex Wound Cleansing - multiple wounds X- 1 5 Wound Imaging (photographs - any number of wounds) []  - 0 Wound Tracing (instead of photographs) X- 1 5 Simple Wound Measurement - one wound []  - 0 Complex Wound Measurement - multiple wounds INTERVENTIONS - Wound Dressings []  - 0 Small Wound Dressing one or multiple wounds []  - 0 Medium Wound Dressing one or multiple wounds []  - 0 Large Wound Dressing one or multiple wounds []  - 0 Application of Medications - topical []  - 0 Application of Medications - injection INTERVENTIONS - Miscellaneous []  - 0 External ear exam []  - 0 Specimen Collection (cultures, biopsies, blood, body fluids, etc.) []  - 0 Specimen(s) / Culture(s) sent or taken to Lab for analysis []  - 0 Patient Transfer (multiple staff / Civil Service fast streamer / Similar devices) []  - 0 Simple Staple / Suture removal (25 or less) []  - 0 Complex Staple / Suture removal (26 or more) []  - 0 Hypo / Hyperglycemic Management (close monitor of Blood Glucose) []  - 0 Ankle / Brachial Index (ABI) - do not check if billed separately X- 1 5 Vital Signs Has the patient been seen at the hospital within the last three years: Yes Total Score: 75 Level Of Care: New/Established - Level 2 Electronic Signature(s) Signed: 09/19/2019 5:14:09 PM By: [name] Entered By: [name] on 09/18/2019 13:46:43 -------------------------------------------------------------------------------- Multi Wound Chart Details Patient Name: Date of Service: Julie Mcgee 09/18/2019 1:00 PM Medical Record Number: [id number] Patient Account Number: [id number] Date of Birth/Sex: Treating RN: [birth year] (86 y.o. [name] Primary Care Samaad Hashem: [name], [facility] [name] Other Clinician: Referring Jaren Vanetten: Treating Biviana Saddler/Extender: [name], SHA NNO  Mcgee Weeks in Treatment: 1 Vital Signs Height(in): 62 Pulse(bpm): 68 Weight(lbs): 188 Blood Pressure(mmHg): 179/81 Body Mass Index(BMI): 34 Temperature(F): 98.0 Respiratory Rate(breaths/min): 18 Photos: [1:No Photos Gluteal fold] [Mcgee/A:Mcgee/A Mcgee/A] Wound Location: [1:Gradually Appeared] [Mcgee/A:Mcgee/A] Wounding Event: [1:Fungal] [Mcgee/A:Mcgee/A] Primary Etiology: [1:Hypertension, Peripheral Venous] [Mcgee/A:Mcgee/A] Comorbid History: [1:Disease 08/06/2019] [Mcgee/A:Mcgee/A] Date Acquired: [1:1] [Mcgee/A:Mcgee/A] Weeks of Treatment: [1:Open] [Mcgee/A:Mcgee/A] Wound Status: [1:0x0x0] [Mcgee/A:Mcgee/A] Measurements L x W x D (cm) [1:0] [Mcgee/A:Mcgee/A] A (cm) : rea [1:0] [Mcgee/A:Mcgee/A] Volume (cm) : [1:100.00%] [Mcgee/A:Mcgee/A] % Reduction in Area: [1:100.00%] [Mcgee/A:Mcgee/A] % Reduction in Volume: [1:Full Thickness Without Exposed] [Mcgee/A:Mcgee/A] Classification: [1:Support Structures None Present] [Mcgee/A:Mcgee/A] Exudate Amount: [1:None Present (0%)] [Mcgee/A:Mcgee/A] Granulation Amount: [1:None Present (0%)] [Mcgee/A:Mcgee/A] Necrotic Amount: [1:Fascia: No] [Mcgee/A:Mcgee/A] Exposed Structures: [1:Fat Layer (Subcutaneous Tissue): No Tendon: No Muscle: No Joint: No Bone: No Large (67-100%)] [Mcgee/A:Mcgee/A] Treatment Notes Electronic Signature(s) Signed: 09/18/2019 5:12:55 PM By: [name] MD Signed: 09/19/2019 5:14:09 PM By: [name] Entered By: [name] on 09/18/2019 13:41:08 -------------------------------------------------------------------------------- Multi-Disciplinary Care Plan Details Patient Name: Date of Service: Julie Mcgee 09/18/2019 1:00 PM Medical Record Number: [id number] Patient Account Number: [id number] Date of Birth/Sex: Treating RN: [birth year] (86 y.o. [name] Primary Care Simonne Boulos: [name], [facility] [name] Other Clinician: Referring Hailei Besser: Treating Brando Taves/Extender: [name], SHA [name] Weeks in Treatment: 1 Active Inactive Electronic Signature(s) Signed: 09/19/2019 5:14:09 PM By: [name] Entered By: [name] on  09/18/2019 13:45:42 -------------------------------------------------------------------------------- Pain Assessment Details Patient Name: Date of Service: Julie Mcgee, Julie Mcgee 09/18/2019 1:00 PM Medical Record Number: [id number] Patient Account Number: [id number] Date of Birth/Sex: Treating RN: [birth year] (86 y.o. [name] Primary Care Dagmar Adcox: [name], [facility] Exie Parody Other Clinician: Referring Delia Sitar: Treating Luciann Gossett/Extender: Dellia Nims  Legrand Como [name], SHA [name] Weeks in Treatment: 1 Active Problems Location of Pain Severity and Description of Pain Patient Has Paino No Site Locations Pain Management and Medication Current Pain Management: Electronic Signature(s) Signed: 09/18/2019 5:32:29 PM By: Levan Hurst RN, BSN Entered By: Levan Hurst on 09/18/2019 13:18:43 -------------------------------------------------------------------------------- Patient/Caregiver Education Details Patient Name: Date of Service: Julie Mcgee 9/7/2021andnbsp1:00 PM Medical Record Number: [id number] Patient Account Number: [id number] Date of Birth/Gender: Treating RN: [birth year] (86 y.o. [name] Primary Care Physician: [name], [facility] [name] Other Clinician: Referring Physician: Treating Physician/Extender: [name], SHA Marcene Corning in Treatment: 1 Education Assessment Education Provided To: Patient Education Topics Provided Wound/Skin Impairment: Methods: Explain/Verbal Responses: State content correctly Electronic Signature(s) Signed: 09/19/2019 5:14:09 PM By: [name] Entered By: [name] on 09/18/2019 13:45:54 -------------------------------------------------------------------------------- Wound Assessment Details Patient Name: Date of Service: Julie Mcgee, Julie Mcgee 09/18/2019 1:00 PM Medical Record Number: [id number] Patient Account Number: [id number] Date of Birth/Sex: Treating RN: May 19, 1933 (86 y.o. [name] Primary Care Daniil Labarge:  [name], [facility] [name] Other Clinician: Referring Jodean Valade: Treating Garold Sheeler/Extender: [name], SHA [name] Weeks in Treatment: 1 Wound Status Wound Number: 1 Primary Etiology: Fungal Wound Location: Gluteal fold Wound Status: Open Wounding Event: Gradually Appeared Comorbid History: Hypertension, Peripheral Venous Disease Date Acquired: 08/06/2019 Weeks Of Treatment: 1 Clustered Wound: No Photos Photo Uploaded By: Mikeal Hawthorne on 09/19/2019 11:43:37 Wound Measurements Length: (cm) Width: (cm) Depth: (cm) Area: (cm) Volume: (cm) 0 % Reduction in Area: 100% 0 % Reduction in Volume: 100% 0 Epithelialization: Large (67-100%) 0 Tunneling: No 0 Undermining: No Wound Description Classification: Full Thickness Without Exposed Support Structures Exudate Amount: None Present Foul Odor After Cleansing: No Slough/Fibrino No Wound Bed Granulation Amount: None Present (0%) Exposed Structure Necrotic Amount: None Present (0%) Fascia Exposed: No Fat Layer (Subcutaneous Tissue) Exposed: No Tendon Exposed: No Muscle Exposed: No Joint Exposed: No Bone Exposed: No Electronic Signature(s) Signed: 09/18/2019 5:32:29 PM By: Levan Hurst RN, BSN Entered By: Levan Hurst on 09/18/2019 13:24:01 -------------------------------------------------------------------------------- [place] Details Patient Name: Date of Service: Julie Bach RO [place] Mcgee 09/18/2019 1:00 PM Medical Record Number: [id number] Patient Account Number: [id number] Date of Birth/Sex: Treating RN: [birth year] (86 y.o. [name] Primary Care Sinia Antosh: [name], [facility] [name] Other Clinician: Referring Minetta Krisher: Treating Tullio Chausse/Extender: [name], SHA [name] Weeks in Treatment: 1 Vital Signs Time Taken: 13:18 Temperature (F): 98.0 Height (in): 62 Pulse (bpm): 68 Weight (lbs): 188 Respiratory Rate (breaths/min): 18 Body Mass Index (BMI): 34.4 Blood Pressure (mmHg): 179/81 Reference  Range: 80 - 120 mg / dl Electronic Signature(s) Signed: 09/18/2019 5:32:29 PM By: Levan Hurst RN, BSN Entered By: Levan Hurst on 09/18/2019 13:18:36

## 2019-09-19 NOTE — Progress Notes (Signed)
XAVIA, KNISKERN ([id number]) Visit Report for 09/18/2019 HPI Details Patient Name: Date of Service: Julie, Mcgee 09/18/2019 1:00 PM Medical Record Number: [id number] Patient Account Number: [id number] Date of Birth/Sex: Treating RN: [birth year] (86 y.o. Julie Mcgee Primary Care Provider: Janeann Mcgee, [facility] [name] Other Clinician: Referring Provider: Treating Provider/Extender: Julie Mcgee Weeks in Treatment: 1 History of Present Illness HPI Description: ADMISSION 09/07/2019 This is a 86 year old woman who lives in the independent part of [place]. She apparently lives with her husband. Somewhat frail but up to now mostly independent. She was recently seen by her primary care which is equal at [facility] for a wound in the gluteal fold near her rectum. She was felt to have possible cellulitis and I believe put on Augmentin. Also noted that she has loose stools and she was put on Imodium. For the last several weeks she has had very painful areas between the cheeks of both buttocks extending into her upper medial thighs and periarea which is exceedingly painful. This is aggravated by urinary frequency and incontinence as well as frequent BMs. She has been using calmoseptine ointment. She also uses some form of wipe/baby wipes when she is incontinent to the area. The Augmentin that she was previously prescribed does not help. She tells me that she has had previous candidal infections under both breasts but not in her periarea. Past medical history includes complete heart block status post pacemaker, aortic stenosis repaired with TAVR, history of a CVA, orthostatic hypotension, bilateral carotid stenosis, colon CA status post right hemicolectomy, right breast mass. She is on Eliquis 9/7; the patient arrives back in clinic. She feels a lot better. The excoriated areas between her buttocks extending into her perineum is a lot better everything is fully epithelialized. I continue  to think that this was some form of severe contact dermatitis to something she was using to these areas perhaps either cream or something in the wipes. In any case everything is epithelialized and less painful Electronic Signature(s) Signed: 09/18/2019 5:12:55 PM By: Julie Ham MD Entered By: Julie Mcgee on 09/18/2019 13:43:47 -------------------------------------------------------------------------------- Physical Exam Details Patient Name: Date of Service: Alfredo Bach [name] 09/18/2019 1:00 PM Medical Record Number: [id number] Patient Account Number: [id number] Date of Birth/Sex: Treating RN: 11/27/1933 (86 y.o. Julie Mcgee Primary Care Provider: Janeann Mcgee, [facility] [name] Other Clinician: Referring Provider: Treating Provider/Extender: Julie Ham Julie SNERI, SHA [name] Weeks in Treatment: 1 Constitutional Patient is hypertensive.. Pulse regular and within target range for patient.[name] Respirations regular, non-labored and within target range.. Temperature is normal and within the target range for the patient.[name] Appears in no distress. Notes Wound exam; everything looks much better fully epithelialized not nearly as angry. This includes especially between her gluteal cleft. Extending into her labia etc. everything is resolved here. Electronic Signature(s) Signed: 09/18/2019 5:12:55 PM By: Julie Ham MD Entered By: Julie Mcgee on 09/18/2019 13:44:34 -------------------------------------------------------------------------------- Physician Orders Details Patient Name: Date of Service: [name], DANSER [name] 09/18/2019 1:00 PM Medical Record Number: [id number] Patient Account Number: [id number] Date of Birth/Sex: Treating RN: 02-10-1933 (86 y.o. 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 Julie Mcgee Primary Care Provider: MA Roanna Mcgee, [facility] [name] Other Clinician: Referring Provider: Treating Provider/Extender: Julie Mcgee Weeks in Treatment: 1 Subjective History of Present Illness (HPI) ADMISSION 09/07/2019 This is a 86 year old woman who lives in the independent part of [place]. She apparently lives with her husband. Somewhat frail but up to now mostly independent. She was recently seen by  her primary care which is equal at [facility] for a wound in the gluteal fold near her rectum. She was felt to have possible cellulitis and I believe put on Augmentin. Also noted that she has loose stools and she was put on Imodium. For the last several weeks she has had very painful areas between the cheeks of both buttocks extending into her upper medial thighs and periarea which is exceedingly painful. This is aggravated by urinary frequency and incontinence as well as frequent BMs. She has been using calmoseptine ointment. She also uses some form of wipe/baby wipes when she is incontinent to the area. The Augmentin that she was previously prescribed does not help. She tells me that she has had previous candidal infections under both breasts but not in her periarea. Past medical history includes complete heart block status post pacemaker, aortic stenosis repaired with TAVR, history of a CVA, orthostatic hypotension, bilateral carotid stenosis, colon CA status post right hemicolectomy, right breast mass. She is on Eliquis 9/7; the patient arrives back in clinic. She feels a lot better. The excoriated areas between her buttocks extending into her perineum is a lot better everything is fully epithelialized. I continue to think that this was some form of severe contact dermatitis to something she was using to these areas perhaps either cream or something in the wipes. In any case everything is epithelialized and less painful Objective Constitutional Patient is hypertensive.. Pulse regular and within target range for patient.[name] Respirations regular, non-labored and within target range.. Temperature is normal and within the target range for the patient.[name] Appears in no distress. Vitals Time Taken: 1:18 PM, Height: 62 in, Weight: 188 lbs, BMI: 34.4, Temperature: 98.0 F, Pulse: 68 bpm, Respiratory Rate: 18 breaths/min, Blood Pressure: 179/81 mmHg. General Notes: Wound exam; everything looks much better  fully epithelialized not nearly as angry. This includes especially between her gluteal cleft. Extending into her labia etc. everything is resolved here. Integumentary (Hair, Skin) Wound #1 status is Open. Original cause of wound was Gradually Appeared. The wound is located on the Gluteal fold. The wound measures 0cm  length x 0cm width x 0cm depth; 0cm^2 area and 0cm^3 volume. There is no tunneling or undermining noted. There is a none present amount of drainage noted. There is no granulation within the wound bed. There is no necrotic tissue within the wound bed. 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 The patient can be discharged from the wound care center 2. I told her to use barrier cream on these areas especially to help with either incontinence of urine or fecal frequency as it applies to her skin. I also cautioned against using anything else such as any other form of cream or baby wipes. I continue to think this was some form of contact dermatitis. Although I wondered whether this could have a component of Candida the fact that is cleaned up so nicely and so quickly makes this much less likely Electronic Signature(s) Signed: 09/18/2019 5:12:55 PM By: Julie Ham MD Entered By: Julie Mcgee on 09/18/2019 13:45:50 -------------------------------------------------------------------------------- [place]Bill Details Patient Name: Date of Service: Alfredo Bach [name] 09/18/2019 Medical Record Number: [id number] Patient Account Number: [id number] Date of Birth/Sex: Treating RN: [birth year] (86 y.o. Julie Mcgee Primary Care Provider: MA Roanna Mcgee, [facility] [name] Other Clinician: Referring Provider: Treating Provider/Extender: Julie Ham Julie SNERI, SHA [name] Weeks in Treatment: 1 Diagnosis Coding ICD-10 Codes Code Description L23.5 Allergic contact  dermatitis due to other chemical products B35.4 Tinea corporis Facility Procedures CPT4 Code: [id number] Description: [phone] - WOUND CARE VISIT-LEV 2 EST PT Modifier: Quantity: 1 Physician Procedures : CPT4 Code Description Modifier [id number] - WC PHYS LEVEL 2 - EST PT ICD-10 Diagnosis Description L23.5 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

## 2019-09-20 DIAGNOSIS — I739 Peripheral vascular disease, unspecified: Secondary | ICD-10-CM | POA: Diagnosis not present

## 2019-09-20 DIAGNOSIS — L03317 Cellulitis of buttock: Secondary | ICD-10-CM | POA: Diagnosis not present

## 2019-09-20 DIAGNOSIS — L89151 Pressure ulcer of sacral region, stage 1: Secondary | ICD-10-CM | POA: Diagnosis not present

## 2019-09-20 DIAGNOSIS — K591 Functional diarrhea: Secondary | ICD-10-CM | POA: Diagnosis not present

## 2019-09-20 DIAGNOSIS — I130 Hypertensive heart and chronic kidney disease with heart failure and stage 1 through stage 4 chronic kidney disease, or unspecified chronic kidney disease: Secondary | ICD-10-CM | POA: Diagnosis not present

## 2019-09-20 DIAGNOSIS — I69331 Monoplegia of upper limb following cerebral infarction affecting right dominant side: Secondary | ICD-10-CM | POA: Diagnosis not present

## 2019-09-20 DIAGNOSIS — I5032 Chronic diastolic (congestive) heart failure: Secondary | ICD-10-CM | POA: Diagnosis not present

## 2019-09-28 ENCOUNTER — Ambulatory Visit (INDEPENDENT_AMBULATORY_CARE_PROVIDER_SITE_OTHER): Payer: Medicare Other | Admitting: *Deleted

## 2019-09-28 DIAGNOSIS — I69331 Monoplegia of upper limb following cerebral infarction affecting right dominant side: Secondary | ICD-10-CM | POA: Diagnosis not present

## 2019-09-28 DIAGNOSIS — I5032 Chronic diastolic (congestive) heart failure: Secondary | ICD-10-CM | POA: Diagnosis not present

## 2019-09-28 DIAGNOSIS — K591 Functional diarrhea: Secondary | ICD-10-CM | POA: Diagnosis not present

## 2019-09-28 DIAGNOSIS — I442 Atrioventricular block, complete: Secondary | ICD-10-CM | POA: Diagnosis not present

## 2019-09-28 DIAGNOSIS — I130 Hypertensive heart and chronic kidney disease with heart failure and stage 1 through stage 4 chronic kidney disease, or unspecified chronic kidney disease: Secondary | ICD-10-CM | POA: Diagnosis not present

## 2019-09-28 DIAGNOSIS — L89151 Pressure ulcer of sacral region, stage 1: Secondary | ICD-10-CM | POA: Diagnosis not present

## 2019-09-28 DIAGNOSIS — L03317 Cellulitis of buttock: Secondary | ICD-10-CM | POA: Diagnosis not present

## 2019-09-30 LAB — CUP PACEART REMOTE DEVICE CHECK
Battery Remaining Longevity: 128 mo
Battery Voltage: 3.02 V
Brady Statistic AP VP Percent: 54.26 %
Brady Statistic AP VS Percent: 0 %
Brady Statistic AS VP Percent: 45.71 %
Brady Statistic AS VS Percent: 0.03 %
Brady Statistic RA Percent Paced: 54.21 %
Brady Statistic RV Percent Paced: 99.97 %
Date Time Interrogation Session: 20210916235009
Implantable Lead Implant Date: 20200220
Implantable Lead Implant Date: 20200220
Implantable Lead Location: 753859
Implantable Lead Location: 753860
Implantable Lead Model: 3830
Implantable Lead Model: 5076
Implantable Pulse Generator Implant Date: 20200220
Lead Channel Impedance Value: 304 Ohm
Lead Channel Impedance Value: 323 Ohm
Lead Channel Impedance Value: 418 Ohm
Lead Channel Impedance Value: 475 Ohm
Lead Channel Pacing Threshold Amplitude: 0.5 V
Lead Channel Pacing Threshold Amplitude: 1 V
Lead Channel Pacing Threshold Pulse Width: 0.4 ms
Lead Channel Pacing Threshold Pulse Width: 0.4 ms
Lead Channel Sensing Intrinsic Amplitude: 23.875 mV
Lead Channel Sensing Intrinsic Amplitude: 23.875 mV
Lead Channel Sensing Intrinsic Amplitude: 3.625 mV
Lead Channel Sensing Intrinsic Amplitude: 3.625 mV
Lead Channel Setting Pacing Amplitude: 1.5 V
Lead Channel Setting Pacing Amplitude: 2.5 V
Lead Channel Setting Pacing Pulse Width: 0.4 ms
Lead Channel Setting Sensing Sensitivity: 2.8 mV

## 2019-10-01 NOTE — Progress Notes (Signed)
Remote pacemaker transmission.   

## 2019-10-03 DIAGNOSIS — L89151 Pressure ulcer of sacral region, stage 1: Secondary | ICD-10-CM | POA: Diagnosis not present

## 2019-10-03 DIAGNOSIS — I69331 Monoplegia of upper limb following cerebral infarction affecting right dominant side: Secondary | ICD-10-CM | POA: Diagnosis not present

## 2019-10-03 DIAGNOSIS — K591 Functional diarrhea: Secondary | ICD-10-CM | POA: Diagnosis not present

## 2019-10-03 DIAGNOSIS — I130 Hypertensive heart and chronic kidney disease with heart failure and stage 1 through stage 4 chronic kidney disease, or unspecified chronic kidney disease: Secondary | ICD-10-CM | POA: Diagnosis not present

## 2019-10-03 DIAGNOSIS — I5032 Chronic diastolic (congestive) heart failure: Secondary | ICD-10-CM | POA: Diagnosis not present

## 2019-10-03 DIAGNOSIS — L03317 Cellulitis of buttock: Secondary | ICD-10-CM | POA: Diagnosis not present

## 2019-10-11 ENCOUNTER — Ambulatory Visit: Payer: Medicare Other | Admitting: Gastroenterology

## 2019-10-11 DIAGNOSIS — C189 Malignant neoplasm of colon, unspecified: Secondary | ICD-10-CM | POA: Diagnosis not present

## 2019-10-11 DIAGNOSIS — E039 Hypothyroidism, unspecified: Secondary | ICD-10-CM | POA: Diagnosis not present

## 2019-10-11 DIAGNOSIS — I480 Paroxysmal atrial fibrillation: Secondary | ICD-10-CM | POA: Diagnosis not present

## 2019-10-11 DIAGNOSIS — E7800 Pure hypercholesterolemia, unspecified: Secondary | ICD-10-CM | POA: Diagnosis not present

## 2019-10-11 DIAGNOSIS — I639 Cerebral infarction, unspecified: Secondary | ICD-10-CM | POA: Diagnosis not present

## 2019-10-11 DIAGNOSIS — I5032 Chronic diastolic (congestive) heart failure: Secondary | ICD-10-CM | POA: Diagnosis not present

## 2019-10-11 DIAGNOSIS — I2510 Atherosclerotic heart disease of native coronary artery without angina pectoris: Secondary | ICD-10-CM | POA: Diagnosis not present

## 2019-10-11 DIAGNOSIS — I10 Essential (primary) hypertension: Secondary | ICD-10-CM | POA: Diagnosis not present

## 2019-11-02 ENCOUNTER — Telehealth: Payer: Self-pay | Admitting: General Surgery

## 2019-11-02 ENCOUNTER — Encounter: Payer: Self-pay | Admitting: Gastroenterology

## 2019-11-02 ENCOUNTER — Ambulatory Visit (INDEPENDENT_AMBULATORY_CARE_PROVIDER_SITE_OTHER): Payer: Medicare Other | Admitting: Gastroenterology

## 2019-11-02 VITALS — BP 146/80 | HR 59 | Ht 61.5 in | Wt 191.4 lb

## 2019-11-02 DIAGNOSIS — I480 Paroxysmal atrial fibrillation: Secondary | ICD-10-CM | POA: Diagnosis not present

## 2019-11-02 DIAGNOSIS — Z952 Presence of prosthetic heart valve: Secondary | ICD-10-CM

## 2019-11-02 DIAGNOSIS — Z86010 Personal history of colonic polyps: Secondary | ICD-10-CM

## 2019-11-02 DIAGNOSIS — Z7901 Long term (current) use of anticoagulants: Secondary | ICD-10-CM | POA: Diagnosis not present

## 2019-11-02 DIAGNOSIS — R194 Change in bowel habit: Secondary | ICD-10-CM

## 2019-11-02 DIAGNOSIS — Z85038 Personal history of other malignant neoplasm of large intestine: Secondary | ICD-10-CM

## 2019-11-02 NOTE — Telephone Encounter (Signed)
[facility] Pre-operative Risk Assessment     Request for surgical clearance:     Endoscopy Procedure  What type of surgery is being performed?     Colonoscopy  When is this surgery scheduled?     11/28/2019  What type of clearance is required ?   Pharmacy  Are there any medications that need to be held prior to surgery and how long? Eliquis for 2 days  Practice name and name of physician performing surgery?      [facility]  What is your office phone and fax number?      Phone- [phone]  [phone]  Anesthesia type (None, local, MAC, general) ?       MAC

## 2019-11-02 NOTE — Telephone Encounter (Signed)
Pharmacy, can you please comment on how long patient can hold Eliquis prior to colonoscopy?  Thank you!

## 2019-11-02 NOTE — Progress Notes (Signed)
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  Hemoglobin was 5.8 on admission, treated with PRBC transfusions.  Colonoscopy on that admission demonstrated ascending colon mass with biopsy-proven adenocarcinoma, s/p right hemicolectomy.  No indication for adjuvant chemotherapy or radiation.  Patient declined genetic testing.  Separately, she has an incidental right breast mass, but she and her family have declined work-up for this. PMHx also includes severe aortic stenosis s/p TAVR 02/2018 (heart block brief asystole requiring CPR and atropine during hospitalization in 05/2018, permanent pacemaker placed), dual antiplatelet therapy (ASA/Plavix), hypertension, hyperlipidemia, hypothyroidism, PVD, carotid artery stenosis, CHF EF 60-65%, CVA with residual right hemiparesis,  obesity (BMI 30).   HPI:     Patient is a 86 y.o. female presenting to the [facility]astro[facility]ology Clinic for follow-up.  Was last seen by me via virtual appointment on 11/30/2018.  Was feeling well at that time from a GI standpoint.  Plan was for repeat colonoscopy in 05/2019 for ongoing surveillance, but this was postponed.  Today, states she has been having loose stools. Had a few episodes pf fecal urgency. Last episode was 1 month ago. Now with one soft stool/day. No watery diarrhea. No hematochezia.   Was dealing with sacral wound, which has since resolved.   Good PO intake. No UGI sxs.   Continues to follow-up regularly with Dr. Lovena Le in the Cardiology clinic, last seen on 09/03/2019.    Review of systems:     No chest pain, no SOB, no fevers, no urinary sx   Past Medical History:  Diagnosis Date  . Arthritis   . Carotid artery stenosis   . Chronic diastolic CHF (congestive heart failure) ([place])   .  Family history of skin cancer   . Former smoker    1/2 ppd for 45 years  . 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 Hyperlipidemia   . Hypertension   . Hypothyroidism   . PAF (paroxysmal atrial fibrillation) ([place])   . Peripheral vascular disease ([place])   . RBBB   . 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 Severe aortic stenosis     Patient's surgical history, family medical history, social history, medications and allergies were all reviewed in Epic    Current Outpatient Medications  Medication Sig Dispense Refill  . amoxicillin (AMOXIL) 500 MG tablet Please take 2,000 mg (4 tablets) one hour prior to all dental procedures. 8 tablet 8  . apixaban (ELIQUIS) 5 MG TABS tablet Take 1 tablet (5 mg total) by mouth 2 (two) times daily. 60 tablet 0  . Calcium Carb-Cholecalciferol (CALTRATE 600+D3 PO) Take 1 tablet by mouth 2 (two) times daily.    . ferrous sulfate 325 (65 FE) MG tablet Take 1 tablet (325 mg total) by mouth every other day. 15 tablet 0  . furosemide (LASIX) 20 MG tablet Take 1 tablet (20 mg total) by mouth daily as needed for fluid or edema. 90 tablet 3  . levothyroxine (SYNTHROID) 100 MCG tablet Take 100 mcg by mouth daily before breakfast.     . metoprolol tartrate (LOPRESSOR) 50 MG tablet Take 50 mg by mouth 2 (two) times daily.    [name]  Multiple Vitamins-Minerals (MULTIVITAMIN ADULTS 50+) TABS Take 1 tablet by mouth daily.    . pantoprazole (PROTONIX) 40 MG tablet Take 1 tablet (40 mg total) by mouth daily. 30 tablet 0  . Probiotic Product (PROBIOTIC DAILY PO) Take 1 tablet by mouth daily.    . simvastatin (ZOCOR) 40 MG tablet Take 40 mg by mouth daily.     No current facility-administered medications for this visit.    Physical Exam:     BP (!) 146/80 (BP Location: Left Arm, Patient Position: Sitting, Cuff  Size: Normal)   Pulse (!) 59   Ht 5' 1.5" (1.562 m)   Wt 191 lb 6 oz (86.8 kg)   SpO2 99%   BMI 35.57 kg/m   GENERAL:  Pleasant female in NAD, sitting in wheelchair PSYCH: : Cooperative, normal affect EENT:  conjunctiva pink, mucous membranes moist, neck supple without masses CARDIAC:  RRR, systolic ejection murmur heard, no peripheral edema PULM: Normal respiratory effort, lungs CTA bilaterally, no wheezing ABDOMEN:  Nondistended, soft, nontender. 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   -Repeat colonoscopy for ongoing surveillance  -First-degree relatives updated on colon cancer screening recommendations  2) Diarrhea/Change in bowel habits -Intermittent loose stools and a few episodes of incontinence over the last few months.  She states the symptoms have now resolved.  Discussed this could certainly be related to her previous right hemicolectomy.  Offered cholestyramine, but given resolution of symptoms, she elected for -Start fiber supplement  3) Systemic anticoagulation 4) History of atrial fibrillation 5) History of TAVR - Hold Eliquis 2 days before procedure - will instruct when and how to resume after procedure. Low but real risk of cardiovascular event such as heart attack, stroke, embolism, thrombosis or ischemia/infarct of other organs off Eliquis explained and need to seek urgent help if this occurs. The patient consents to proceed. Will communicate by phone or EMR with patient's prescribing provider to confirm that holding Eliquis is reasonable in this case  The  indications, risks, and benefits of colonoscopy were explained to the patient in detail. Risks include but are not limited to bleeding, perforation, adverse reaction to medications, and cardiopulmonary compromise. Sequelae include but are not limited to the possibility of surgery, hospitalization, and mortality. The patient verbalized understanding and wished to proceed. All questions answered, referred to the scheduler and bowel prep ordered. Further recommendations pending results of the exam.             [place] ,DO, FACG 11/02/2019, 1:22 PM

## 2019-11-02 NOTE — Patient Instructions (Signed)
If you are age 84 or older, your body mass index should be between 23-30. Your Body mass index is 35.57 kg/m. If this is out of the aforementioned range listed, please consider follow up with your Primary Care Provider.  If you are age 44 or younger, your body mass index should be between 19-25. Your Body mass index is 35.57 kg/m. If this is out of the aformentioned range listed, please consider follow up with your Primary Care Provider.    You have been given a sample of Clenpiq for your colonoscopy  You will be contaced by our office prior to your procedure for directions on holding your Eliquis.  If you do not hear from our office 1 week prior to your scheduled procedure, please call [phone] to discuss.  Due to recent changes in healthcare laws, you may see the results of your imaging and laboratory studies on MyChart before your provider has had a chance to review them.  We understand that in some cases there may be results that are confusing or concerning to you. Not all laboratory results come back in the same time frame and the provider may be waiting for multiple results in order to interpret others.  Please give [place] 48 hours in order for your provider to thoroughly review all the results before contacting the office for clarification of your results.

## 2019-11-02 NOTE — Telephone Encounter (Signed)
Patient with diagnosis of afib on Eliquis for anticoagulation.    Procedure: Colonoscopy Date of procedure: 11/28/19    CHA2DS2-VASc Score = 7  This indicates a 11.2% annual risk of stroke. The patient's score is based upon: CHF History: 1 HTN History: 1 Diabetes History: 0 Stroke History: 2 Vascular Disease History: 0 Age Score: 2 Gender Score: 1      CrCl 52 ml/min  Patient is at high risk off anticoagulation due to her hx of stroke and cancer. I would recommend holding anticoagulation only 1 day prior. GI requesting 2 days. I will get Dr. Julien Nordmann input.

## 2019-11-06 NOTE — Telephone Encounter (Signed)
I am afraid I am not the primary cardiologist taking care of this patient!

## 2019-11-06 NOTE — Telephone Encounter (Signed)
I agree, her CHADS2VASC score is high with a hx of CVA - would only hold 1 day

## 2019-11-06 NOTE — Telephone Encounter (Signed)
   Primary Cardiologist: Jenean Lindau, MD  Chart reviewed as part of pre-operative protocol coverage. Given past medical history and time since last visit, based on ACC/AHA guidelines, Julie Mcgee would be at acceptable risk for the planned procedure without further cardiovascular testing.   [facility] only requesting pharmacological clearance for Eliquis. Per pharmacy and Dr. Theodosia Blender recommendation due to high CHADSVASC and pmh of stroke to hold Eliquis 1 day prior to the procedure.   The patient was advised that if she develops new symptoms prior to surgery to contact our office to arrange for a follow-up visit, and she verbalized understanding.  I will route this recommendation to the requesting party via Epic fax function and remove from pre-op pool.  Please call with questions.  Cato Liburd Ninfa Meeker, PA-C 11/06/2019, 2:30 PM

## 2019-11-06 NOTE — Telephone Encounter (Signed)
Notified the patient to stop her Eliquis for 1 day prior to procedure. Patient verbalized understanding.

## 2019-11-16 ENCOUNTER — Ambulatory Visit: Payer: Medicare Other | Admitting: Podiatry

## 2019-11-16 DIAGNOSIS — I480 Paroxysmal atrial fibrillation: Secondary | ICD-10-CM | POA: Diagnosis not present

## 2019-11-16 DIAGNOSIS — I10 Essential (primary) hypertension: Secondary | ICD-10-CM | POA: Diagnosis not present

## 2019-11-16 DIAGNOSIS — I5032 Chronic diastolic (congestive) heart failure: Secondary | ICD-10-CM | POA: Diagnosis not present

## 2019-11-16 DIAGNOSIS — C189 Malignant neoplasm of colon, unspecified: Secondary | ICD-10-CM | POA: Diagnosis not present

## 2019-11-16 DIAGNOSIS — I2510 Atherosclerotic heart disease of native coronary artery without angina pectoris: Secondary | ICD-10-CM | POA: Diagnosis not present

## 2019-11-16 DIAGNOSIS — E7800 Pure hypercholesterolemia, unspecified: Secondary | ICD-10-CM | POA: Diagnosis not present

## 2019-11-16 DIAGNOSIS — I639 Cerebral infarction, unspecified: Secondary | ICD-10-CM | POA: Diagnosis not present

## 2019-11-16 DIAGNOSIS — E039 Hypothyroidism, unspecified: Secondary | ICD-10-CM | POA: Diagnosis not present

## 2019-11-22 NOTE — Telephone Encounter (Signed)
Patient is calling with questions about the Eliquis

## 2019-11-23 NOTE — Telephone Encounter (Signed)
Tried to contact the patient regarding her recent call. Went to [employer]. Mailbox was full and unable to leave a message.

## 2019-11-28 ENCOUNTER — Encounter: Payer: Self-pay | Admitting: Gastroenterology

## 2019-11-28 ENCOUNTER — Ambulatory Visit (AMBULATORY_SURGERY_CENTER): Payer: Medicare Other | Admitting: Gastroenterology

## 2019-11-28 ENCOUNTER — Other Ambulatory Visit: Payer: Self-pay

## 2019-11-28 ENCOUNTER — Telehealth: Payer: Self-pay | Admitting: Gastroenterology

## 2019-11-28 VITALS — BP 146/68 | HR 60 | Temp 97.8°F | Resp 12 | Ht 61.0 in | Wt 191.0 lb

## 2019-11-28 DIAGNOSIS — K640 First degree hemorrhoids: Secondary | ICD-10-CM

## 2019-11-28 DIAGNOSIS — R197 Diarrhea, unspecified: Secondary | ICD-10-CM | POA: Diagnosis not present

## 2019-11-28 DIAGNOSIS — R194 Change in bowel habit: Secondary | ICD-10-CM | POA: Diagnosis not present

## 2019-11-28 DIAGNOSIS — Z85038 Personal history of other malignant neoplasm of large intestine: Secondary | ICD-10-CM | POA: Diagnosis not present

## 2019-11-28 DIAGNOSIS — K621 Rectal polyp: Secondary | ICD-10-CM

## 2019-11-28 DIAGNOSIS — K52832 Lymphocytic colitis: Secondary | ICD-10-CM

## 2019-11-28 DIAGNOSIS — D125 Benign neoplasm of sigmoid colon: Secondary | ICD-10-CM | POA: Diagnosis not present

## 2019-11-28 DIAGNOSIS — D128 Benign neoplasm of rectum: Secondary | ICD-10-CM

## 2019-11-28 MED ORDER — SODIUM CHLORIDE 0.9 % IV SOLN: 500.0000 mL | Freq: Once | INTRAVENOUS | Status: DC

## 2019-11-28 NOTE — Patient Instructions (Addendum)
HANDOUTS PROVIDED ON: diverticulosis and polyps  The polyps removed today have been sent for pathology.  The results can take 1-3 weeks to receive.  When your next colonoscopy should occur will be based on the pathology results.    You may resume your previous diet and medication schedule. Questran 1 packet by mouth daily. Split taking Questran from other medications by at least 2 hours.  Thank you for allowing [place] to care for you today!!!   YOU HAD AN ENDOSCOPIC PROCEDURE TODAY AT [facility]:   Refer to the procedure report that was given to you for any specific questions about what was found during the examination.  If the procedure report does not answer your questions, please call your gastroenterologist to clarify.  If you requested that your care partner not be given the details of your procedure findings, then the procedure report has been included in a sealed envelope for you to review at your convenience later.  YOU SHOULD EXPECT: Some feelings of bloating in the abdomen. Passage of more gas than usual.  Walking can help get rid of the air that was put into your GI tract during the procedure and reduce the bloating. If you had a lower endoscopy (such as a colonoscopy or flexible sigmoidoscopy) you may notice spotting of blood in your stool or on the toilet paper. If you underwent a bowel prep for your procedure, you may not have a normal bowel movement for a few days.  Please Note:  You might notice some irritation and congestion in your nose or some drainage.  This is from the oxygen used during your procedure.  There is no need for concern and it should clear up in a day or so.  SYMPTOMS TO REPORT IMMEDIATELY:   Following lower endoscopy (colonoscopy or flexible sigmoidoscopy):  Excessive amounts of blood in the stool  Significant tenderness or worsening of abdominal pains  Swelling of the abdomen that is new, acute  Fever of 100F or higher   For urgent or  emergent issues, a gastroenterologist can be reached at any hour by calling [phone]. Do not use MyChart messaging for urgent concerns.    DIET:  We do recommend a small meal at first, but then you may proceed to your regular diet.  Drink plenty of fluids but you should avoid alcoholic beverages for 24 hours.  ACTIVITY:  You should plan to take it easy for the rest of today and you should NOT DRIVE or use heavy machinery until tomorrow (because of the sedation medicines used during the test).    FOLLOW UP: Our staff will call the number listed on your records 48-72 hours following your procedure to check on you and address any questions or concerns that you may have regarding the information given to you following your procedure. If we do not reach you, we will leave a message.  We will attempt to reach you two times.  During this call, we will ask if you have developed any symptoms of COVID 19. If you develop any symptoms (ie: fever, flu-like symptoms, shortness of breath, cough etc.) before then, please call [phone].  If you test positive for Covid 19 in the 2 weeks post procedure, please call and report this information to [place].    If any biopsies were taken you will be contacted by phone or by letter within the next 1-3 weeks.  Please call us at [phone] if you have not heard about the biopsies  in 3 weeks.    SIGNATURES/CONFIDENTIALITY: You and/or your care partner have signed paperwork which will be entered into your electronic medical record.  These signatures attest to the fact that that the information above on your After Visit Summary has been reviewed and is understood.  Full responsibility of the confidentiality of this discharge information lies with you and/or your care-partner.

## 2019-11-28 NOTE — Progress Notes (Deleted)
Pt's states no medical or surgical changes since previsit or office visit. 

## 2019-11-28 NOTE — Op Note (Signed)
[facility] [name] Procedure Date: 11/28/2019 1:23 PM MRN: [id number] Endoscopist: Gerrit Heck , MD Age: 86 Referring MD:  Date of Birth: [birth year] Gender: Female Account #: [id number] Procedure:                Colonoscopy Indications:              High risk colon cancer surveillance: Personal                            history of colon cancer. Hx of Incidental - Change                            in bowel habits, Incidental - Diarrhea                           86 year old female with stage I (T1 N0 M0)                            Adenocarcinoma of the ascending colon (MSI high),                            diagnosed in 05/2018 during hospital admission for                            symptomatic anemia?now s/p?right hemicolectomy on                            that admission, presents for ongoing surveillance.                            She has had intermittent loose/watery stools and                            episodes of fecal seepage/urgency since surgery. Medicines:                Monitored Anesthesia Care Procedure:                Pre-Anesthesia Assessment:                           - Prior to the procedure, a History and Physical                            was performed, and patient medications and                            allergies were reviewed. The patient's tolerance of                            previous anesthesia was also reviewed. The risks                            and benefits of the procedure and the sedation  options and risks were discussed with the patient.                            All questions were answered, and informed consent                            was obtained. Prior Anticoagulants: The patient has                            taken Eliquis (apixaban), last dose was 2 days                            prior to procedure. ASA Grade Assessment: III - A                            patient with severe  systemic disease. After                            reviewing the risks and benefits, the patient was                            deemed in satisfactory condition to undergo the                            procedure.                           After obtaining informed consent, the colonoscope                            was passed under direct vision. Throughout the                            procedure, the patient's blood pressure, pulse, and                            oxygen saturations were monitored continuously. The                            Colonoscope was introduced through the anus and                            advanced to the the ileocolonic anastomosis. The                            colonoscopy was performed without difficulty. The                            patient tolerated the procedure well. The quality                            of the bowel preparation was good. The neo-terminal  ileum and the rectum and surgical anastamosis were                            photographed. Scope In: 1:39:07 PM Scope Out: 1:58:06 PM Scope Withdrawal Time: 0 hours 15 minutes 28 seconds  Total Procedure Duration: 0 hours 18 minutes 59 seconds  Findings:                 The perianal and digital rectal examinations were                            normal.                           Three sessile polyps were found in the sigmoid                            colon. The polyps were 2 to 3 mm in size. These                            polyps were removed with a cold biopsy forceps.                            Resection and retrieval were complete. Estimated                            blood loss was minimal.                           A few sessile polyps were found in the rectum. The                            polyps were 1 to 2 mm in size. Five of these polyps                            were removed with a cold biopsy forceps for                            histologic representative  evaluation. Resection and                            retrieval were complete. Estimated blood loss was                            minimal.                           Normal mucosa was found in the remainder of the                            colon. Biopsies for histology were taken with a                            cold forceps from the left colon for evaluation of  microscopic colitis. Estimated blood loss was                            minimal.                           There was evidence of a prior end-to-side                            ileo-colonic anastomosis in the transverse colon.                            This was patent and was characterized by healthy                            appearing mucosa.                           Non-bleeding internal hemorrhoids were found during                            retroflexion. The hemorrhoids were small. Complications:            No immediate complications. Estimated Blood Loss:     Estimated blood loss was minimal. Impression:               - Three 2 to 3 mm polyps in the sigmoid colon,                            removed with a cold biopsy forceps. Resected and                            retrieved.                           - A few 1 to 2 mm polyps in the rectum, removed                            with a cold biopsy forceps. Resected and retrieved.                           - Normal mucosa in the entire examined colon.                            Biopsied.                           - Patent end-to-side ileo-colonic anastomosis,                            characterized by healthy appearing mucosa.                           - Non-bleeding internal hemorrhoids. Recommendation:           - Patient has a contact number available for  emergencies. The signs and symptoms of potential                            delayed complications were discussed with the                            patient. Return  to normal activities tomorrow.                            Written discharge instructions were provided to the                            patient.                           - Resume previous diet.                           - Continue present medications.                           - Await pathology results.                           - Repeat colonoscopy in 3 years for surveillance.                           - Use Questran at 1 packet (4 grams) PO daily.                            Split taking Questran from other medications by at                            least 2 hours. Gerrit Heck, MD 11/28/2019 2:16:07 PM

## 2019-11-28 NOTE — Telephone Encounter (Signed)
Explained to Will that I will check with Dr Bryan Lemma tomorrow when he is in clinic and get back in contact with him.

## 2019-11-28 NOTE — Progress Notes (Signed)
Called to room to assist during endoscopic procedure.  Patient ID and intended procedure confirmed with present staff. Received instructions for my participation in the procedure from the performing physician.  

## 2019-11-28 NOTE — Progress Notes (Signed)
pt tolerated well. VSS. awake and to recovery. Report given to RN.  

## 2019-11-28 NOTE — Progress Notes (Signed)
Pt's states no medical or surgical changes since previsit or office visit.  CW - vitals 

## 2019-11-29 MED ORDER — CHOLESTYRAMINE 4 G PO PACK: 4.0000 g | PACK | Freq: Every day | ORAL | 11 refills | Status: DC

## 2019-11-29 NOTE — Telephone Encounter (Signed)
Per Dr Bryan Lemma rx needed for Questran 4mg  packets take 2 hours prior to eating in the am.  Left a voicemail for the patients son that it sent to the pharmacy this am.

## 2019-11-30 ENCOUNTER — Telehealth: Payer: Self-pay

## 2019-11-30 ENCOUNTER — Telehealth: Payer: Self-pay | Admitting: *Deleted

## 2019-11-30 ENCOUNTER — Ambulatory Visit (INDEPENDENT_AMBULATORY_CARE_PROVIDER_SITE_OTHER): Payer: Medicare Other | Admitting: Podiatry

## 2019-11-30 ENCOUNTER — Other Ambulatory Visit: Payer: Self-pay

## 2019-11-30 ENCOUNTER — Encounter: Payer: Self-pay | Admitting: Podiatry

## 2019-11-30 DIAGNOSIS — B351 Tinea unguium: Secondary | ICD-10-CM | POA: Diagnosis not present

## 2019-11-30 DIAGNOSIS — M79676 Pain in unspecified toe(s): Secondary | ICD-10-CM | POA: Diagnosis not present

## 2019-11-30 NOTE — Telephone Encounter (Signed)
  Follow up Call-  Call back number 11/28/2019  Post procedure Call Back phone  # [phone]  Permission to leave phone message Yes  Some recent data might be hidden     Patient questions:  Do you have a fever, pain , or abdominal swelling? No. Pain Score  0 *  Have you tolerated food without any problems? Yes.    Have you been able to return to your normal activities? Yes.    Do you have any questions about your discharge instructions: Diet   No. Medications  No. Follow up visit  No.  Do you have questions or concerns about your Care? No.  Actions: * If pain score is 4 or above: No action needed, pain <4.  1. Have you developed a fever since your procedure? no  2.   Have you had an respiratory symptoms (SOB or cough) since your procedure? no  3.   Have you tested positive for COVID 19 since your procedure no  4.   Have you had any family members/close contacts diagnosed with the COVID 19 since your procedure?  no   If yes to any of these questions please route to Joylene John, RN and Joella Prince, RN

## 2019-11-30 NOTE — Telephone Encounter (Signed)
Attempted to reach patient for post-procedure f/u call. No answer. Left message that we will make another attempt to reach her later today and for her to please not hesitate to call us if she has any questions/concerns regarding her care. 

## 2019-11-30 NOTE — Telephone Encounter (Signed)
Patient returned the call and said she was ok had some diarrhea last night but otherwise well. Advise patient if she had any concerns or question please call the office.

## 2019-12-02 NOTE — Progress Notes (Signed)
Subjective:  Patient ID: Julie Mcgee, female    DOB: [birth year],  MRN: [id number]  Rayetta Veith presents to clinic today for painful thick toenails that are difficult to trim. Pain interferes with ambulation. Aggravating factors include wearing enclosed shoe gear. Pain is relieved with periodic professional debridement..  86 y.o. female presents with the above complaint.    Review of Systems: Negative except as noted in the HPI. Past Medical History:  Diagnosis Date  . Arthritis   . Carotid artery stenosis   . Chronic diastolic CHF (congestive heart failure) ([place])   . Family history of skin cancer   . Former smoker    1/2 ppd for 45 years  . 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 Hyperlipidemia   . Hypertension   . Hypothyroidism   . PAF (paroxysmal atrial fibrillation) ([place])   . Peripheral vascular disease ([place])   . RBBB   . 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 Severe aortic stenosis    Past Surgical History:  Procedure Laterality Date  . BASAL CELL CARCINOMA EXCISION  2018  . BIOPSY  05/19/2018   Procedure: BIOPSY;  Surgeon: Lavena Bullion, DO;  Location: [place];  Service: Gastroenterology;;  . CARDIAC CATHETERIZATION    . COLONOSCOPY  2001  . COLONOSCOPY WITH PROPOFOL N/A 05/19/2018   Procedure: COLONOSCOPY WITH PROPOFOL;  Surgeon: Lavena Bullion, DO;  Location: [place];  Service: Gastroenterology;  Laterality: N/A;  . ESOPHAGOGASTRODUODENOSCOPY (EGD) WITH PROPOFOL N/A 05/19/2018   Procedure: ESOPHAGOGASTRODUODENOSCOPY (EGD) WITH PROPOFOL;  Surgeon: Lavena Bullion, DO;  Location: [place];  Service: Gastroenterology;  Laterality: N/A;  . HIP SURGERY  2005  . KNEE SURGERY  1998  . LAPAROSCOPIC PARTIAL COLECTOMY N/A 05/24/2018   Procedure: LAPAROSCOPIC ASSISTED ASCENDING  HEMICOLECTOMY;  Surgeon: Ileana Roup, MD;  Location: [place];  Service: General;  Laterality: N/A;  . PACEMAKER IMPLANT N/A 03/02/2018   Procedure: PACEMAKER IMPLANT;  Surgeon: Evans Lance, MD;  Location: [place] [facility];  Service: Cardiovascular;  Laterality: N/A;  . RIGHT/LEFT HEART CATH AND CORONARY ANGIOGRAPHY N/A 02/21/2018   Procedure: RIGHT/LEFT HEART CATH AND CORONARY ANGIOGRAPHY;  Surgeon: Sherren Mocha, MD;  Location: [place] [facility];  Service: Cardiovascular;  Laterality: N/A;  . SUBMUCOSAL TATTOO INJECTION  05/19/2018   Procedure: SUBMUCOSAL TATTOO INJECTION;  Surgeon: Lavena Bullion, DO;  Location: [place];  Service: Gastroenterology;;  . TEE WITHOUT CARDIOVERSION N/A 02/28/2018   Procedure: TRANSESOPHAGEAL ECHOCARDIOGRAM (TEE);  Surgeon: Sherren Mocha, MD;  Location: [place] [facility];  Service: Open Heart Surgery;  Laterality: N/A;  . TEMPORARY PACEMAKER N/A 03/01/2018   Procedure: TEMPORARY PACEMAKER;  Surgeon: Burnell Blanks, MD;  Location: [place] [facility];  Service: Cardiovascular;  Laterality: N/A;  . TONSILLECTOMY    . TRANSCATHETER AORTIC VALVE REPLACEMENT, TRANSFEMORAL N/A 02/28/2018   Procedure: TRANSCATHETER AORTIC VALVE REPLACEMENT, TRANSFEMORAL;  Surgeon: Sherren Mocha, MD;  Location: [place] [facility];  Service: Open Heart Surgery;  Laterality: N/A;    Current Outpatient Medications:  .  amoxicillin (AMOXIL) 500 MG tablet, Please take 2,000 mg (4 tablets) one hour prior to all dental procedures., Disp: 8 tablet, Rfl: 8 .  apixaban (ELIQUIS) 5 MG TABS tablet, Take 1 tablet (5 mg total) by mouth 2 (two) times daily., Disp: 60 tablet, Rfl: 0 .  Calcium  Carb-Cholecalciferol (CALTRATE 600+D3 PO), Take 1 tablet by mouth 2 (two) times daily., Disp: , Rfl:  .  cholestyramine (QUESTRAN) 4 g packet, Take 1 packet (4 g total) by mouth daily. Take 2 hour prior to morning meal, Disp: 30 each, Rfl: 11 .  ferrous sulfate 325 (65 FE) MG tablet,  Take 1 tablet (325 mg total) by mouth every other day., Disp: 15 tablet, Rfl: 0 .  furosemide (LASIX) 20 MG tablet, Take 1 tablet (20 mg total) by mouth daily as needed for fluid or edema. (Patient not taking: Reported on 11/28/2019), Disp: 90 tablet, Rfl: 3 .  levothyroxine (SYNTHROID) 100 MCG tablet, Take 100 mcg by mouth daily before breakfast. , Disp: , Rfl:  .  metoprolol tartrate (LOPRESSOR) 50 MG tablet, Take 50 mg by mouth 2 (two) times daily., Disp: , Rfl:  .  Multiple Vitamins-Minerals (MULTIVITAMIN ADULTS 50+) TABS, Take 1 tablet by mouth daily., Disp: , Rfl:  .  pantoprazole (PROTONIX) 40 MG tablet, Take 1 tablet (40 mg total) by mouth daily., Disp: 30 tablet, Rfl: 0 .  Probiotic Product (PROBIOTIC DAILY PO), Take 1 tablet by mouth daily., Disp: , Rfl:  .  simvastatin (ZOCOR) 40 MG tablet, Take 40 mg by mouth daily., Disp: , Rfl:  Allergies  Allergen Reactions  . Adhesive [Tape] Other (See Comments)    Causes BLISTERS   Social History   Occupational History  . Not on file  Tobacco Use  . Smoking status: Former Smoker    Packs/day: 1.00    Years: 45.00    Pack years: 45.00    Types: Cigarettes    Quit date: 05/23/2009    Years since quitting: 10.5  . Smokeless tobacco: Never Used  Vaping Use  . Vaping Use: Never used  Substance and Sexual Activity  . Alcohol use: Not Currently  . Drug use: Never  . Sexual activity: Not on file    Objective:   Constitutional Toniqua Melamed is a pleasant 86 y.o. Caucasian female, in NAD. AAO x 3.   Vascular Capillary refill time to digits immediate b/l. Palpable pedal pulses b/l LE. Pedal hair sparse. Lower extremity skin temperature gradient within normal limits. No cyanosis or clubbing noted.  Neurologic Normal speech. Oriented to person, place, and time. Protective sensation intact 5/5 intact bilaterally with 10g monofilament b/l.  Dermatologic Pedal skin with normal turgor, texture and tone bilaterally. No open wounds bilaterally. No  interdigital macerations bilaterally. Toenails 1-5 b/l elongated, discolored, dystrophic, thickened, crumbly with subungual debris and tenderness to dorsal palpation.  Orthopedic: Normal muscle strength 5/5 to all lower extremity muscle groups bilaterally. Utilizes walker for ambulation assistance.   Radiographs: None Assessment:   1. Pain due to onychomycosis of toenail    Plan:  Patient was evaluated and treated and all questions answered.  Onychomycosis with pain -Nails palliatively debridement as below -Educated on self-care  Procedure: Nail Debridement Rationale: Pain Type of Debridement: manual, sharp debridement. Instrumentation: Nail nipper, rotary burr. Number of Nails: 10 -Examined patient. -Patient to continue soft, supportive shoe gear daily. -Toenails 1-5 b/l were debrided in length and girth with sterile nail nippers and dremel without iatrogenic bleeding.  -Patient to report any pedal injuries to medical professional immediately. -Patient/POA to call should there be question/concern in the interim.  Return in about 3 months (around 03/01/2020).  Marzetta Board, DPM

## 2019-12-10 ENCOUNTER — Telehealth: Payer: Self-pay | Admitting: General Surgery

## 2019-12-10 NOTE — Telephone Encounter (Signed)
Spoke with patient and went over all instructions verbatim. Patient verbalized understanding and stated that she has not had to start the Questran at this point. She did understand that the Questran would help her colitis but did not feel she needed it.

## 2019-12-23 ENCOUNTER — Other Ambulatory Visit: Payer: Self-pay | Admitting: Cardiology

## 2019-12-24 NOTE — Telephone Encounter (Signed)
Prescription refill request for Eliquis received.  Last office visit: Julie Mcgee 09/03/2019 Scr: 0.88, 11/22/2018 Age: 86 Weight: 86.6 kg   Pt is ovedue for blood work.

## 2019-12-24 NOTE — Telephone Encounter (Addendum)
Called and spoke to pt's PCP office who stated they do not have any blood work on her in the past year. Called pt and LMOM. Pt called back and spoke to pt made her aware that she would need to come in for blood work. Pt stated that she could not come in for blood work because she dose not drive and that I could call her son [name] who takes her to her appointments, his number [phone]. Called and spoke to Gentry, informed him that pt will need to get blood work for future refills. He asked if it can be drawn at [facility] because it is closer for him, informed him that would be fine but we will need a cbc and bmet. [name] stated he will schedule the lab appointment for his mom as soon as possible. But asked if the refill can go ahead and be sent so pt does not run out of Eliquis. Prescription refill sent.

## 2019-12-28 ENCOUNTER — Ambulatory Visit (INDEPENDENT_AMBULATORY_CARE_PROVIDER_SITE_OTHER): Payer: Medicare Other

## 2019-12-28 DIAGNOSIS — E039 Hypothyroidism, unspecified: Secondary | ICD-10-CM | POA: Diagnosis not present

## 2019-12-28 DIAGNOSIS — I10 Essential (primary) hypertension: Secondary | ICD-10-CM | POA: Diagnosis not present

## 2019-12-28 DIAGNOSIS — I442 Atrioventricular block, complete: Secondary | ICD-10-CM | POA: Diagnosis not present

## 2019-12-28 DIAGNOSIS — E7800 Pure hypercholesterolemia, unspecified: Secondary | ICD-10-CM | POA: Diagnosis not present

## 2019-12-28 LAB — CUP PACEART REMOTE DEVICE CHECK
Battery Remaining Longevity: 122 mo
Battery Voltage: 3.02 V
Brady Statistic AP VP Percent: 39.39 %
Brady Statistic AP VS Percent: 0.96 %
Brady Statistic AS VP Percent: 59.36 %
Brady Statistic AS VS Percent: 0.29 %
Brady Statistic RA Percent Paced: 40.31 %
Brady Statistic RV Percent Paced: 98.76 %
Date Time Interrogation Session: 20211216203838
Implantable Lead Implant Date: 20200220
Implantable Lead Implant Date: 20200220
Implantable Lead Location: 753859
Implantable Lead Location: 753860
Implantable Lead Model: 3830
Implantable Lead Model: 5076
Implantable Pulse Generator Implant Date: 20200220
Lead Channel Impedance Value: 285 Ohm
Lead Channel Impedance Value: 304 Ohm
Lead Channel Impedance Value: 361 Ohm
Lead Channel Impedance Value: 456 Ohm
Lead Channel Pacing Threshold Amplitude: 0.5 V
Lead Channel Pacing Threshold Amplitude: 1 V
Lead Channel Pacing Threshold Pulse Width: 0.4 ms
Lead Channel Pacing Threshold Pulse Width: 0.4 ms
Lead Channel Sensing Intrinsic Amplitude: 2.625 mV
Lead Channel Sensing Intrinsic Amplitude: 2.625 mV
Lead Channel Sensing Intrinsic Amplitude: 28.125 mV
Lead Channel Sensing Intrinsic Amplitude: 28.125 mV
Lead Channel Setting Pacing Amplitude: 1.5 V
Lead Channel Setting Pacing Amplitude: 2.5 V
Lead Channel Setting Pacing Pulse Width: 0.4 ms
Lead Channel Setting Sensing Sensitivity: 2.8 mV

## 2019-12-29 DIAGNOSIS — I639 Cerebral infarction, unspecified: Secondary | ICD-10-CM | POA: Diagnosis not present

## 2019-12-29 DIAGNOSIS — I480 Paroxysmal atrial fibrillation: Secondary | ICD-10-CM | POA: Diagnosis not present

## 2019-12-29 DIAGNOSIS — C189 Malignant neoplasm of colon, unspecified: Secondary | ICD-10-CM | POA: Diagnosis not present

## 2019-12-29 DIAGNOSIS — E7800 Pure hypercholesterolemia, unspecified: Secondary | ICD-10-CM | POA: Diagnosis not present

## 2019-12-29 DIAGNOSIS — I2510 Atherosclerotic heart disease of native coronary artery without angina pectoris: Secondary | ICD-10-CM | POA: Diagnosis not present

## 2019-12-29 DIAGNOSIS — I10 Essential (primary) hypertension: Secondary | ICD-10-CM | POA: Diagnosis not present

## 2019-12-29 DIAGNOSIS — E039 Hypothyroidism, unspecified: Secondary | ICD-10-CM | POA: Diagnosis not present

## 2019-12-29 DIAGNOSIS — I5032 Chronic diastolic (congestive) heart failure: Secondary | ICD-10-CM | POA: Diagnosis not present

## 2020-01-10 NOTE — Progress Notes (Signed)
Remote pacemaker transmission.   

## 2020-02-01 ENCOUNTER — Ambulatory Visit: Payer: Medicare Other | Admitting: Cardiology

## 2020-02-15 ENCOUNTER — Telehealth: Payer: Medicare Other | Admitting: Cardiology

## 2020-02-21 ENCOUNTER — Telehealth: Payer: Medicare Other | Admitting: Cardiology

## 2020-02-22 ENCOUNTER — Telehealth: Payer: Self-pay | Admitting: *Deleted

## 2020-02-22 ENCOUNTER — Other Ambulatory Visit: Payer: Self-pay

## 2020-02-22 ENCOUNTER — Encounter: Payer: Self-pay | Admitting: Cardiology

## 2020-02-22 ENCOUNTER — Telehealth (INDEPENDENT_AMBULATORY_CARE_PROVIDER_SITE_OTHER): Payer: Medicare Other | Admitting: Cardiology

## 2020-02-22 VITALS — HR 59 | Ht 61.5 in | Wt 185.0 lb

## 2020-02-22 DIAGNOSIS — E7800 Pure hypercholesterolemia, unspecified: Secondary | ICD-10-CM

## 2020-02-22 DIAGNOSIS — I6523 Occlusion and stenosis of bilateral carotid arteries: Secondary | ICD-10-CM

## 2020-02-22 DIAGNOSIS — I10 Essential (primary) hypertension: Secondary | ICD-10-CM

## 2020-02-22 DIAGNOSIS — C189 Malignant neoplasm of colon, unspecified: Secondary | ICD-10-CM

## 2020-02-22 DIAGNOSIS — I5032 Chronic diastolic (congestive) heart failure: Secondary | ICD-10-CM

## 2020-02-22 DIAGNOSIS — I442 Atrioventricular block, complete: Secondary | ICD-10-CM

## 2020-02-22 DIAGNOSIS — I350 Nonrheumatic aortic (valve) stenosis: Secondary | ICD-10-CM

## 2020-02-22 DIAGNOSIS — I050 Rheumatic mitral stenosis: Secondary | ICD-10-CM

## 2020-02-22 DIAGNOSIS — I480 Paroxysmal atrial fibrillation: Secondary | ICD-10-CM

## 2020-02-22 DIAGNOSIS — N63 Unspecified lump in unspecified breast: Secondary | ICD-10-CM

## 2020-02-22 NOTE — Telephone Encounter (Signed)
I will forward this to MD who is requesting order.

## 2020-02-22 NOTE — Progress Notes (Signed)
Virtual Visit via Video Note   This visit type was conducted due to national recommendations for restrictions regarding the COVID-19 Pandemic (e.g. social distancing) in an effort to limit this patient's exposure and mitigate transmission in our community.  Due to her co-morbid illnesses, this patient is at least at moderate risk for complications without adequate follow up.  This format is felt to be most appropriate for this patient at this time.  All issues noted in this document were discussed and addressed.  A limited physical exam was performed with this format.  Please refer to the patient's chart for her consent to telehealth for [facility].       Date:  02/22/2020   ID:  Julie Mcgee, DOB 05/06/1933, MRN [id number] The patient was identified using 2 identifiers.  Patient Location: Home Provider Location: Home Office   PCP:  Masneri, Shannon M, [place]  Cardiologist:  Jenean Lindau, MD  Advanced Practice Provider:  No care team member to display Electrophysiologist:  Cristopher Peru, MD [phone]   Evaluation Performed:  Follow-Up Visit  Chief Complaint: AS, PAF, HTN, carotid stenosis  History of Present Illness:    Julie Mcgee is a 87 y.o. female with a hx of  severe AS s/p 16m Edwards Sapien 3 THV TAVR 02/2018, RBBB, carotid artery stenosis, Hypertension, hyperlipidemia, PAF noted on Pacer check, complete heart block s/p PPM, chronic diastolic CHF. She is followed in device clinic for her PPM.  Unfortunately she was diagnosed with colon cancer in her ascending colon after stenting with a GI bleed. She had been on DAPT for her recent TNovella Olivethat was held. She had hemicolectomy on 05/24/2018 for invasive adeno CA. Prior to discharge her hemoglobin was stable at 7.1 and she was placed back on DAPT therapy. Prior to discharge she was also noted to have a mass in her right breast by CT that was suspicious for cancer and this is  going to be worked up as an outpatient.  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 They reviewed her staging scans which showed a 2.6 cm lobulated mass in the right central breast with enlarged mediastinal and hilar lymph nodes. It was felt that this was likely a primary breast cancer and not metastasis. Patient was not ready to consider further work-up at this time.Her follow hemoglobin was 9.3 on 06/07/2018.  The patient does not have symptoms concerning for COVID-19 infection (fever, chills, cough, or new shortness of breath).    Past Medical History:  Diagnosis Date  . Arthritis   . Carotid artery stenosis   . Chronic diastolic CHF (congestive heart failure) ([place]   . Family history of skin cancer   . Former smoker    1/2 ppd for 45 years  . 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 Hyperlipidemia   . Hypertension   . Hypothyroidism   . PAF (paroxysmal atrial fibrillation) ([place]   . Peripheral vascular disease ([place]   . RBBB   . 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 Severe aortic stenosis    Past Surgical History:  Procedure Laterality Date  . BASAL CELL CARCINOMA EXCISION  2018  . BIOPSY  05/19/2018   Procedure:  BIOPSY;  Surgeon: Lavena Bullion, DO;  [facility];  Service: Gastroenterology;;  . CARDIAC CATHETERIZATION    . COLONOSCOPY  2001  . COLONOSCOPY WITH PROPOFOL N/A 05/19/2018   Procedure: COLONOSCOPY WITH PROPOFOL;  Surgeon: Lavena Bullion, DO;  Location: [place];  Service: Gastroenterology;  Laterality: N/A;  . ESOPHAGOGASTRODUODENOSCOPY (EGD) WITH PROPOFOL N/A 05/19/2018   Procedure: ESOPHAGOGASTRODUODENOSCOPY (EGD) WITH PROPOFOL;  Surgeon: Lavena Bullion, DO;  Location: [place];  Service: Gastroenterology;  Laterality: N/A;  . HIP SURGERY  2005  . KNEE SURGERY  1998  . LAPAROSCOPIC PARTIAL COLECTOMY N/A 05/24/2018   Procedure: LAPAROSCOPIC ASSISTED ASCENDING HEMICOLECTOMY;  Surgeon: Ileana Roup, MD;  Location: [place];  Service: General;  Laterality: N/A;  . PACEMAKER IMPLANT N/A 03/02/2018   Procedure: PACEMAKER IMPLANT;  Surgeon: Evans Lance, MD;  Location: [place] [facility];  Service: Cardiovascular;  Laterality: N/A;  . RIGHT/LEFT HEART CATH AND CORONARY ANGIOGRAPHY N/A 02/21/2018   Procedure: RIGHT/LEFT HEART CATH AND CORONARY ANGIOGRAPHY;  Surgeon: Sherren Mocha, MD;  Location: [place] [facility];  Service: Cardiovascular;  Laterality: N/A;  . SUBMUCOSAL TATTOO INJECTION  05/19/2018   Procedure: SUBMUCOSAL TATTOO INJECTION;  Surgeon: Lavena Bullion, DO;  Location: [place];  Service: Gastroenterology;;  . TEE WITHOUT CARDIOVERSION N/A 02/28/2018   Procedure: TRANSESOPHAGEAL ECHOCARDIOGRAM (TEE);  Surgeon: Sherren Mocha, MD;  Location: [place] [facility];  Service: Open Heart Surgery;  Laterality: N/A;  . TEMPORARY PACEMAKER N/A 03/01/2018   Procedure: TEMPORARY PACEMAKER;  Surgeon: Burnell Blanks, MD;  Location: [place] [facility];  Service: Cardiovascular;  Laterality: N/A;  . TONSILLECTOMY    . TRANSCATHETER AORTIC VALVE REPLACEMENT, TRANSFEMORAL N/A 02/28/2018   Procedure: TRANSCATHETER AORTIC VALVE REPLACEMENT, TRANSFEMORAL;  Surgeon: Sherren Mocha, MD;  Location: [place] [facility];  Service: Open Heart Surgery;  Laterality: N/A;     Current Meds  Medication Sig  . amoxicillin (AMOXIL) 500 MG tablet Please take 2,000 mg (4 tablets) one hour prior to all dental procedures.  . Calcium Carb-Cholecalciferol (CALTRATE 600+D3 PO) Take 1 tablet by mouth 2 (two) times daily.  . cholestyramine (QUESTRAN) 4 g packet Take 1 packet (4 g total) by mouth daily. Take 2 hour prior to morning meal  . ELIQUIS 5 MG TABS tablet TAKE 1  TABLET (5 MG TOTAL) BY MOUTH 2 (TWO) TIMES DAILY.  . ferrous sulfate 325 (65 FE) MG tablet Take 1 tablet (325 mg total) by mouth every other day.  . furosemide (LASIX) 20 MG tablet Take 1 tablet (20 mg total) by mouth daily as needed for fluid or edema.  [name] levothyroxine (SYNTHROID) 100 MCG tablet Take 100 mcg by mouth daily before breakfast.   . metoprolol tartrate (LOPRESSOR) 50 MG tablet Take 50 mg by mouth 2 (two) times daily.  . Multiple Vitamins-Minerals (MULTIVITAMIN ADULTS 50+) TABS Take 1 tablet by mouth daily.  . pantoprazole (PROTONIX) 40 MG tablet Take 1 tablet (40 mg total) by mouth daily.  . Probiotic Product (PROBIOTIC DAILY PO) Take 1 tablet by mouth daily.  . simvastatin (ZOCOR) 40 MG tablet Take 40 mg by mouth daily.     Allergies:   Adhesive [tape]   Social History   Tobacco Use  . Smoking status: Former Smoker    Packs/day: 1.00    Years: 45.00    Pack years: 45.00    Types: Cigarettes    Quit date: 05/23/2009    Years since quitting: 10.7  . Smokeless  tobacco: Never Used  Vaping Use  . Vaping Use: Never used  Substance Use Topics  . Alcohol use: Not Currently  . Drug use: Never     Family Hx: The patient's family history includes Alcohol abuse in her father; Chronic Renal Failure in her mother; Heart failure in her mother; Skin cancer (age of onset: 59) in her sister; Skin cancer (age of onset: 84) in her sister. There is no history of Colon cancer, Esophageal cancer, Pancreatic cancer, or Stomach cancer.  ROS:   Please see the history of present illness.     All other systems reviewed and are negative.   Prior CV studies:   The following studies were reviewed today:  2D echo 03/08/2019 IMPRESSIONS    1. 23 mm Edwards S3. V max 3.2 m/s, mean gradient 24 mmHG, EOA 1.20 cm2,  DI 0.32. Suspect an element of PPM (EOAi 0.67 m2/cm2) as gradients were  high day after implant. Jet contour is not rounded to suspect obstruction.  No regurgitation or  paravalvular  leak. Gradients stable compared with 09/28/2018. The aortic valve has been  repaired/replaced. Aortic valve regurgitation is not visualized. There is  a 23 mm Edwards Sapien prosthetic (TAVR) valve present in the aortic  position. Procedure Date: 02/28/18.  2. Severe calcific mitral stenosis. MVA by VTI 1.15 cm2. PASP severely  elevated. The mitral valve is degenerative. Moderate to severe mitral  valve regurgitation. Severe mitral stenosis. The mean mitral valve  gradient is 11.0 mmHg with average heart rate  of 60 bpm.  3. Left ventricular ejection fraction, by estimation, is 65 to 70%. The  left ventricle has normal function. The left ventricle has no regional  wall motion abnormalities. There is moderate concentric left ventricular  hypertrophy. Left ventricular  diastolic function could not be evaluated. Left ventricular diastolic  function could not be evaluated.  4. Right ventricular systolic function is normal. The right ventricular  size is mildly enlarged. There is severely elevated pulmonary artery  systolic pressure. The estimated right ventricular systolic pressure is  39.0 mmHg.  5. Left atrial size was severely dilated.  6. Right atrial size was moderately dilated.  7. Tricuspid valve regurgitation is moderate to severe.  8. The inferior vena cava is dilated in size with <50% respiratory  variability, suggesting right atrial pressure of 15 mmHg.   Comparison(s): A prior study was performed on 09/28/2018. No significant  change from prior study. Prior images reviewed side by side. TAVR  gradients stable. Severe calcific mitral stenosis still present.   Labs/Other Tests and Data Reviewed:    EKG:  No ECG reviewed.  Recent Labs: No results found for requested labs within last 8760 hours.   Recent Lipid Panel No results found for: CHOL, TRIG, HDL, CHOLHDL, LDLCALC, LDLDIRECT  Wt Readings from Last 3 Encounters:  02/22/20 185 lb (83.9 kg)   11/28/19 191 lb (86.6 kg)  11/02/19 191 lb 6 oz (86.8 kg)     Risk Assessment/Calculations:    Objective:    Vital Signs:  Pulse (!) 59   Ht 5' 1.5" (1.562 m)   Wt 185 lb (83.9 kg)   SpO2 96%   BMI 34.39 kg/m    VITAL SIGNS:  reviewed GEN:  no acute distress EYES:  sclerae anicteric, EOMI - Extraocular Movements Intact RESPIRATORY:  normal respiratory effort, symmetric expansion CARDIOVASCULAR:  no peripheral edema SKIN:  no rash, lesions or ulcers. MUSCULOSKELETAL:  no obvious deformities. NEURO:  alert and oriented x 3,  no obvious focal deficit PSYCH:  normal affect  ASSESSMENT & PLAN:    1.Severe AS -s/p 79m Edwards Sapien 3 THV TAVR 02/2018.  -No ASA since she is on DOAC -Follow-up echo 02/2019 showed normal LV function with a stable TAVR with slightly increased mean aortic valve gradient 24 mmHg felt to be due to a degree of PPM.  2. Hypertension -continue Lopressor 50 mg twice daily.  3. Hyperlipidemia -her LDL goal is less than 70 due to carotid stenosis.  -continue Simvastatin 4107mdaily -repeat FLP and ALT -her daughter requests that we have [facility] come in to draw her blood due to COVID 19  4. Bilateral carotid artery stenosis -Carotid Dopplers 02/21/2018 showed 1 to 39% stenosis of the carotid arteries bilaterally. -Continue on statin. -no ASA due to DOAC  5. Chronic diastolic CHF -she has not had anysignificantlower extremity edema or shortness of breath.  -Her weight is stable and actually has dropped 6lbs since NOV -continue on diuretics PRN  6. AdenoCA of the colon  -status post hemicolectomy for stage I colon adeno CA. -followed by oncology  7. Breast mass  -felt to likely be a primary cancer but at this time patient not ready to follow-up with oncology for work-up.  8.  Complete heart block -s/p PPM  -followed in device clinic  9.  Severe Rheumatic mitral stenosis -noted on echo 02/2019 -no sx of LE edema,  SOB  -not a surgical candidate for structural heart team -echo 02/2019 showed severe mitral stenosis and moderate to severe mitral regurgitation -I encouraged her and her son to let me know if she develops any SOB, LE edema or recurrent syncope  10.  PAF -noted on pacer interrogation -asymptomatic -continue BB and Eliquis -she has not had any bleeding problems -I will repeat a BMET and CBC since she is on anticoagulation   COVID-19 Education: The signs and symptoms of COVID-19 were discussed with the patient and how to seek care for testing (follow up with PCP or arrange E-visit).  The importance of social distancing was discussed today.  Time:   Today, I have spent 20 minutes with the patient with telehealth technology discussing the above problems.     Medication Adjustments/Labs and Tests Ordered: Current medicines are reviewed at length with the patient today.  Concerns regarding medicines are outlined above.   Tests Ordered: No orders of the defined types were placed in this encounter.   Medication Changes: No orders of the defined types were placed in this encounter.   Follow Up:  In Person in 6 month(s)  Signed, TrFransico HimMD  02/22/2020 8:56 AM    [place]

## 2020-02-22 NOTE — Telephone Encounter (Signed)
[facility] Visit Initial Request  Date of Request ([place]):  February 22, 2020  Requesting Provider:  DR. Fransico Him    Agency Requested:    [facility] Contact:  Glory Buff, NP [place], [place] [address] Phone #:  [phone] Fax #:  [phone]  Patient Demographic Information: Name:  Julie Mcgee Age:  87 y.o.   DOB:  02-04-1933  MRN:  [id number]   Address:   [address] [place] [address]   Phone Numbers:   Home Phone [phone]  Mobile [phone]     Emergency Contact Information on File:   Contact Information    Name Relation Home Work Mobile   Pizio,William Son [phone]  [phone]   [place] Daughter [phone]  [phone]      The above family members may be contacted for information on this patient (review DPR on file):  Yes    Patient Clinical Information:  Primary Care Provider:  Lyman Bishop, DO  Primary Cardiologist:  Jenean Lindau, MD  Primary Electrophysiologist:  Cristopher Peru, MD   Past Medical Hx: Julie Mcgee  has a past medical history of Arthritis, Carotid artery stenosis, Chronic diastolic CHF (congestive heart failure) ([facility]), Family history of skin cancer, Former smoker, History of CVA (cerebrovascular accident) (2011), Hyperlipidemia, Hypertension, Hypothyroidism, PAF (paroxysmal atrial fibrillation) ([place]), Peripheral vascular disease ([place]), RBBB, 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   Allergies: She is allergic to adhesive [tape].   Medications: Current Outpatient Medications on File Prior to Visit  Medication Sig  . amoxicillin (AMOXIL) 500 MG tablet Please take 2,000 mg (4 tablets) one hour prior to all dental procedures.  . Calcium Carb-Cholecalciferol (CALTRATE 600+D3 PO) Take 1 tablet by mouth 2 (two) times daily.  . cholestyramine (QUESTRAN) 4 g packet Take 1 packet (4 g total) by mouth  daily. Take 2 hour prior to morning meal  . ELIQUIS 5 MG TABS tablet TAKE 1 TABLET (5 MG TOTAL) BY MOUTH 2 (TWO) TIMES DAILY.  . ferrous sulfate 325 (65 FE) MG tablet Take 1 tablet (325 mg total) by mouth every other day.  . furosemide (LASIX) 20 MG tablet Take 1 tablet (20 mg total) by mouth daily as needed for fluid or edema.  [name] levothyroxine (SYNTHROID) 100 MCG tablet Take 100 mcg by mouth daily before breakfast.   . metoprolol tartrate (LOPRESSOR) 50 MG tablet Take 50 mg by mouth 2 (two) times daily.  . Multiple Vitamins-Minerals (MULTIVITAMIN ADULTS 50+) TABS Take 1 tablet by mouth daily.  . pantoprazole (PROTONIX) 40 MG tablet Take 1 tablet (40 mg total) by mouth daily.  . Probiotic Product (PROBIOTIC DAILY PO) Take 1 tablet by mouth daily.  . simvastatin (ZOCOR) 40 MG tablet Take 40 mg by mouth daily.   No current facility-administered medications on file prior to visit.     Social Hx: She  reports that she quit smoking about 10 years ago. Her smoking use included cigarettes. She has a 45.00 pack-year smoking history. She has never used smokeless tobacco. She reports previous alcohol use. She reports that she does not use drugs.    Diagnosis/Reason for Visit:    LAB WORK  Severe aortic stenosis   Primary hypertension   Pure hypercholesterolemia   Bilateral carotid artery stenosis   Chronic diastolic CHF (congestive heart failure) (HCC)   Malignant neoplasm of colon, unspecified part of colon (HCC)   Breast mass   Complete  heart block (HCC)   Rheumatic mitral stenosis   PAF (paroxysmal atrial fibrillation) ([place]    Services Requested:  Labs:  CMP, LIPID PANEL, CBC  # of Visits Needed/Frequency per Week: 1 VISIT TO BE DONE IN THE NEXT 1-2 WEEKS

## 2020-02-22 NOTE — Telephone Encounter (Signed)
Called [facility] and they say they are unable to go out to draw labs due to pt not being currently enrolled in their care.  Spoke with daughter in law and she will contact PCP to see if they will draw the labs.  If they are unable to, she will make an appt for pt to come into our office.

## 2020-02-22 NOTE — Telephone Encounter (Signed)
° °  Julie Mcgee with [facility], she said they received a referral for pt to do labs only, and they couldn't do it since pt is not an establish pt of them.

## 2020-02-22 NOTE — Patient Instructions (Signed)
Medication Instructions:  Your physician recommends that you continue on your current medications as directed. Please refer to the Current Medication list given to you today.  *If you need a refill on your cardiac medications before your next appointment, please call your pharmacy*   Lab Work: Lipid, CMP and CBC  If you have labs (blood work) drawn today and your tests are completely normal, you will receive your results only by: [name] MyChart Message (if you have MyChart) OR . A paper copy in the mail If you have any lab test that is abnormal or we need to change your treatment, we will call you to review the results.   Testing/Procedures: None   Follow-Up: At [facility], you and your health needs are our priority.  As part of our continuing mission to provide you with exceptional heart care, we have created designated Provider Care Teams.  These Care Teams include your primary Cardiologist (physician) and Advanced Practice Providers (APPs -  Physician Assistants and Nurse Practitioners) who all work together to provide you with the care you need, when you need it.  We recommend signing up for the patient portal called "MyChart".  Sign up information is provided on this After Visit Summary.  MyChart is used to connect with patients for Virtual Visits (Telemedicine).  Patients are able to view lab/test results, encounter notes, upcoming appointments, etc.  Non-urgent messages can be sent to your provider as well.   To learn more about what you can do with MyChart, go to [email or web address].    Your next appointment:   6 month(s)  The format for your next appointment:   in person or virtual  Provider:   You may see Dr. Fransico Him or one of the following Advanced Practice Providers on your designated Care Team:    Melina Copa, PA-C  Ermalinda Barrios, PA-C    Other Instructions

## 2020-02-22 NOTE — Addendum Note (Signed)
Addended by: Loren Racer on: 02/22/2020 01:06 PM   Modules accepted: Orders

## 2020-02-29 ENCOUNTER — Encounter (HOSPITAL_BASED_OUTPATIENT_CLINIC_OR_DEPARTMENT_OTHER): Payer: Medicare Other | Admitting: Internal Medicine

## 2020-03-11 ENCOUNTER — Ambulatory Visit: Payer: Medicare Other | Admitting: Podiatry

## 2020-03-14 ENCOUNTER — Other Ambulatory Visit: Payer: Self-pay | Admitting: Cardiology

## 2020-03-14 ENCOUNTER — Encounter: Payer: Self-pay | Admitting: Podiatry

## 2020-03-14 ENCOUNTER — Other Ambulatory Visit: Payer: Self-pay

## 2020-03-14 ENCOUNTER — Telehealth: Payer: Self-pay | Admitting: Cardiology

## 2020-03-14 ENCOUNTER — Ambulatory Visit (INDEPENDENT_AMBULATORY_CARE_PROVIDER_SITE_OTHER): Payer: Medicare Other | Admitting: Podiatry

## 2020-03-14 DIAGNOSIS — I639 Cerebral infarction, unspecified: Secondary | ICD-10-CM | POA: Insufficient documentation

## 2020-03-14 DIAGNOSIS — E7800 Pure hypercholesterolemia, unspecified: Secondary | ICD-10-CM | POA: Insufficient documentation

## 2020-03-14 DIAGNOSIS — B351 Tinea unguium: Secondary | ICD-10-CM

## 2020-03-14 DIAGNOSIS — M79676 Pain in unspecified toe(s): Secondary | ICD-10-CM | POA: Diagnosis not present

## 2020-03-14 DIAGNOSIS — I2510 Atherosclerotic heart disease of native coronary artery without angina pectoris: Secondary | ICD-10-CM | POA: Insufficient documentation

## 2020-03-14 NOTE — Telephone Encounter (Signed)
Prescription refill request for Eliquis received.   hx: afib  Last office visit: 09/03/2019, Lovena Le [place]r: 0.82, 12/28/2019 via [facility]  Age:  87 yo  Weight: 83.9 kg   Pt is on the correct dose of Eliquis per dosing criteria. Prescription refill sent for Eliquis 5mg  BID.

## 2020-03-14 NOTE — Telephone Encounter (Signed)
Thanks Jeani Hawking, LPN, will leave pt 1 box of Eliquis 5 mg tablets at the front desk for pt to pick up.

## 2020-03-14 NOTE — Telephone Encounter (Signed)
Hey Lynn, LPN, can you please advise on this matter? Thanks  ?

## 2020-03-14 NOTE — Telephone Encounter (Signed)
**Note De-Identified Rafel Garde Obfuscation** The pts son [name]) states that he forgot to call and request a refill for the pts Eliquis from [facility] until today and was advised that her shipment will take 5 to 7 days to arrive. He is concerned that she will run out of Eliquis as she only has 2 tablets left.  He is advised that we will leave her 1 sample box of Eliquis 5 mg in the front office for him to pick up and reminded him of the importance of ordering her refills on time as our samples are limited.  He verbalized understanding and thanked me for our help.

## 2020-03-14 NOTE — Telephone Encounter (Signed)
Patient calling the office for samples of medication:   1.  What medication and dosage are you requesting samples for? eliquis  2.  Are you currently out of this medication? yes

## 2020-03-17 ENCOUNTER — Encounter (HOSPITAL_BASED_OUTPATIENT_CLINIC_OR_DEPARTMENT_OTHER): Payer: Medicare Other | Admitting: Internal Medicine

## 2020-03-22 NOTE — Progress Notes (Signed)
  Subjective:  Patient ID: Julie Mcgee, female    DOB: 08/12/1933,  MRN: [id number]  Julie Mcgee presents to clinic today for painful thick toenails that are difficult to trim. Pain interferes with ambulation. Aggravating factors include wearing enclosed shoe gear. Pain is relieved with periodic professional debridement..  She voices no new pedal problems on today's visit. PCP is Dr. Crissie Sickles and last visit was 03/12/2020.  Review of Systems: Negative except as noted in the HPI.  Allergies  Allergen Reactions  . Adhesive [Tape] Other (See Comments)    Causes BLISTERS   Social History   Occupational History  . Not on file  Tobacco Use  . Smoking status: Former Smoker    Packs/day: 1.00    Years: 45.00    Pack years: 45.00    Types: Cigarettes    Quit date: 05/23/2009    Years since quitting: 10.8  . Smokeless tobacco: Never Used  Vaping Use  . Vaping Use: Never used  Substance and Sexual Activity  . Alcohol use: Not Currently  . Drug use: Never  . Sexual activity: Not on file    Objective:   Constitutional Julie Mcgee is a pleasant 87 y.o. Caucasian female, in NAD. AAO x 3.   Vascular Capillary refill time to digits immediate b/l. Palpable pedal pulses b/l LE. Pedal hair sparse. Lower extremity skin temperature gradient within normal limits. No cyanosis or clubbing noted.  Neurologic Normal speech. Oriented to person, place, and time. Protective sensation intact 5/5 intact bilaterally with 10g monofilament b/l.  Dermatologic Pedal skin with normal turgor, texture and tone bilaterally. No open wounds bilaterally. No interdigital macerations bilaterally. Toenails 1-5 b/l elongated, discolored, dystrophic, thickened, crumbly with subungual debris and tenderness to dorsal palpation.  Orthopedic: Normal muscle strength 5/5 to all lower extremity muscle groups bilaterally. Utilizes walker for ambulation assistance.   Radiographs: None Assessment:   1. Pain due to  onychomycosis of toenail    Plan:  Patient was evaluated and treated and all questions answered.  Onychomycosis with pain -Nails palliatively debridement as below -Educated on self-care  Procedure: Nail Debridement Rationale: Pain Type of Debridement: manual, sharp debridement. Instrumentation: Nail nipper, rotary burr. Number of Nails: 10 -Examined patient. -No new findings. No new orders. -Patient to continue soft, supportive shoe gear daily. -Toenails 1-5 b/l were debrided in length and girth with sterile nail nippers and dremel without iatrogenic bleeding.  -Patient to report any pedal injuries to medical professional immediately. -Patient/POA to call should there be question/concern in the interim.  Return in about 3 months (around 06/14/2020).  Marzetta Board, DPM

## 2020-03-28 ENCOUNTER — Ambulatory Visit (INDEPENDENT_AMBULATORY_CARE_PROVIDER_SITE_OTHER): Payer: Medicare Other

## 2020-03-28 DIAGNOSIS — I469 Cardiac arrest, cause unspecified: Secondary | ICD-10-CM

## 2020-03-28 LAB — CUP PACEART REMOTE DEVICE CHECK
Battery Remaining Longevity: 121 mo
Battery Voltage: 3.01 V
Brady Statistic AP VP Percent: 33.78 %
Brady Statistic AP VS Percent: 16.5 %
Brady Statistic AS VP Percent: 37.75 %
Brady Statistic AS VS Percent: 11.98 %
Brady Statistic RA Percent Paced: 50.21 %
Brady Statistic RV Percent Paced: 71.53 %
Date Time Interrogation Session: 20220317212302
Implantable Lead Implant Date: 20200220
Implantable Lead Implant Date: 20200220
Implantable Lead Location: 753859
Implantable Lead Location: 753860
Implantable Lead Model: 3830
Implantable Lead Model: 5076
Implantable Pulse Generator Implant Date: 20200220
Lead Channel Impedance Value: 304 Ohm
Lead Channel Impedance Value: 304 Ohm
Lead Channel Impedance Value: 399 Ohm
Lead Channel Impedance Value: 475 Ohm
Lead Channel Pacing Threshold Amplitude: 0.5 V
Lead Channel Pacing Threshold Amplitude: 1 V
Lead Channel Pacing Threshold Pulse Width: 0.4 ms
Lead Channel Pacing Threshold Pulse Width: 0.4 ms
Lead Channel Sensing Intrinsic Amplitude: 2.5 mV
Lead Channel Sensing Intrinsic Amplitude: 2.5 mV
Lead Channel Sensing Intrinsic Amplitude: 29.875 mV
Lead Channel Sensing Intrinsic Amplitude: 29.875 mV
Lead Channel Setting Pacing Amplitude: 1.5 V
Lead Channel Setting Pacing Amplitude: 2.5 V
Lead Channel Setting Pacing Pulse Width: 0.4 ms
Lead Channel Setting Sensing Sensitivity: 2.8 mV

## 2020-04-01 ENCOUNTER — Encounter (HOSPITAL_BASED_OUTPATIENT_CLINIC_OR_DEPARTMENT_OTHER): Payer: Medicare Other | Admitting: Internal Medicine

## 2020-04-04 NOTE — Progress Notes (Signed)
Remote pacemaker transmission.   

## 2020-04-16 ENCOUNTER — Encounter (HOSPITAL_BASED_OUTPATIENT_CLINIC_OR_DEPARTMENT_OTHER): Payer: Medicare Other | Admitting: Physician Assistant

## 2020-05-13 ENCOUNTER — Encounter (HOSPITAL_BASED_OUTPATIENT_CLINIC_OR_DEPARTMENT_OTHER): Payer: Medicare Other | Admitting: Internal Medicine

## 2020-05-14 ENCOUNTER — Other Ambulatory Visit: Payer: Self-pay | Admitting: Radiology

## 2020-05-16 ENCOUNTER — Telehealth: Payer: Self-pay | Admitting: *Deleted

## 2020-05-16 ENCOUNTER — Encounter: Payer: Self-pay | Admitting: *Deleted

## 2020-05-16 DIAGNOSIS — Z170 Estrogen receptor positive status [ER+]: Secondary | ICD-10-CM | POA: Insufficient documentation

## 2020-05-16 DIAGNOSIS — C50212 Malignant neoplasm of upper-inner quadrant of left female breast: Secondary | ICD-10-CM

## 2020-05-16 NOTE — Telephone Encounter (Signed)
Confirmed [facility] for 08/21/20 at 1215 .  Instructions and contact information given.

## 2020-05-19 ENCOUNTER — Telehealth: Payer: Self-pay | Admitting: *Deleted

## 2020-05-19 NOTE — Telephone Encounter (Signed)
Confirmed appt with pt daughter in law Julie Mcgee. Gave directions and instructions Pt has hx of stroke x13yrs ago

## 2020-05-20 ENCOUNTER — Encounter: Payer: Self-pay | Admitting: Nurse Practitioner

## 2020-05-21 ENCOUNTER — Encounter: Payer: Self-pay | Admitting: *Deleted

## 2020-05-21 ENCOUNTER — Encounter: Payer: Self-pay | Admitting: General Practice

## 2020-05-21 ENCOUNTER — Inpatient Hospital Stay: Payer: Medicare Other

## 2020-05-21 ENCOUNTER — Ambulatory Visit: Payer: Medicare Other | Admitting: Physical Therapy

## 2020-05-21 ENCOUNTER — Other Ambulatory Visit: Payer: Self-pay

## 2020-05-21 ENCOUNTER — Encounter: Payer: Self-pay | Admitting: Hematology

## 2020-05-21 ENCOUNTER — Ambulatory Visit
Admission: RE | Admit: 2020-05-21 | Discharge: 2020-05-21 | Disposition: A | Discharge status: Home / Self Care | Payer: Medicare Other | Source: Ambulatory Visit | Attending: Radiation Oncology | Admitting: Radiation Oncology

## 2020-05-21 ENCOUNTER — Inpatient Hospital Stay: Payer: Medicare Other | Attending: Hematology | Admitting: Hematology

## 2020-05-21 ENCOUNTER — Other Ambulatory Visit: Payer: Self-pay | Admitting: *Deleted

## 2020-05-21 VITALS — BP 158/80 | HR 62 | Temp 97.7°F | Resp 18 | Ht 62.0 in | Wt 208.4 lb

## 2020-05-21 DIAGNOSIS — E039 Hypothyroidism, unspecified: Secondary | ICD-10-CM | POA: Insufficient documentation

## 2020-05-21 DIAGNOSIS — Z96653 Presence of artificial knee joint, bilateral: Secondary | ICD-10-CM | POA: Diagnosis not present

## 2020-05-21 DIAGNOSIS — Z170 Estrogen receptor positive status [ER+]: Secondary | ICD-10-CM

## 2020-05-21 DIAGNOSIS — I509 Heart failure, unspecified: Secondary | ICD-10-CM | POA: Insufficient documentation

## 2020-05-21 DIAGNOSIS — C50212 Malignant neoplasm of upper-inner quadrant of left female breast: Secondary | ICD-10-CM

## 2020-05-21 DIAGNOSIS — I4891 Unspecified atrial fibrillation: Secondary | ICD-10-CM | POA: Diagnosis not present

## 2020-05-21 DIAGNOSIS — C182 Malignant neoplasm of ascending colon: Secondary | ICD-10-CM

## 2020-05-21 DIAGNOSIS — Z85038 Personal history of other malignant neoplasm of large intestine: Secondary | ICD-10-CM | POA: Insufficient documentation

## 2020-05-21 DIAGNOSIS — I110 Hypertensive heart disease with heart failure: Secondary | ICD-10-CM | POA: Insufficient documentation

## 2020-05-21 DIAGNOSIS — C50411 Malignant neoplasm of upper-outer quadrant of right female breast: Secondary | ICD-10-CM | POA: Insufficient documentation

## 2020-05-21 DIAGNOSIS — Z7901 Long term (current) use of anticoagulants: Secondary | ICD-10-CM | POA: Insufficient documentation

## 2020-05-21 DIAGNOSIS — Z87891 Personal history of nicotine dependence: Secondary | ICD-10-CM | POA: Insufficient documentation

## 2020-05-21 DIAGNOSIS — C50811 Malignant neoplasm of overlapping sites of right female breast: Secondary | ICD-10-CM

## 2020-05-21 DIAGNOSIS — Z8673 Personal history of transient ischemic attack (TIA), and cerebral infarction without residual deficits: Secondary | ICD-10-CM | POA: Diagnosis not present

## 2020-05-21 DIAGNOSIS — Z8249 Family history of ischemic heart disease and other diseases of the circulatory system: Secondary | ICD-10-CM | POA: Insufficient documentation

## 2020-05-21 DIAGNOSIS — Z85828 Personal history of other malignant neoplasm of skin: Secondary | ICD-10-CM | POA: Diagnosis not present

## 2020-05-21 DIAGNOSIS — D649 Anemia, unspecified: Secondary | ICD-10-CM | POA: Diagnosis not present

## 2020-05-21 DIAGNOSIS — Z79899 Other long term (current) drug therapy: Secondary | ICD-10-CM | POA: Insufficient documentation

## 2020-05-21 LAB — CBC WITH DIFFERENTIAL (CANCER CENTER ONLY)
Abs Immature Granulocytes: 0.03 10*3/uL (ref 0.00–0.07)
Basophils Absolute: 0 10*3/uL (ref 0.0–0.1)
Basophils Relative: 0 %
Eosinophils Absolute: 0.4 10*3/uL (ref 0.0–0.5)
Eosinophils Relative: 5 %
HCT: 39.9 % (ref 36.0–46.0)
Hemoglobin: 13 g/dL (ref 12.0–15.0)
Immature Granulocytes: 0 %
Lymphocytes Relative: 15 %
Lymphs Abs: 1.2 10*3/uL (ref 0.7–4.0)
MCH: 31 pg (ref 26.0–34.0)
MCHC: 32.6 g/dL (ref 30.0–36.0)
MCV: 95.2 fL (ref 80.0–100.0)
Monocytes Absolute: 0.6 10*3/uL (ref 0.1–1.0)
Monocytes Relative: 7 %
Neutro Abs: 5.7 10*3/uL (ref 1.7–7.7)
Neutrophils Relative %: 73 %
Platelet Count: 221 10*3/uL (ref 150–400)
RBC: 4.19 MIL/uL (ref 3.87–5.11)
RDW: 14.7 % (ref 11.5–15.5)
WBC Count: 7.9 10*3/uL (ref 4.0–10.5)
nRBC: 0 % (ref 0.0–0.2)

## 2020-05-21 LAB — CMP (CANCER CENTER ONLY)
ALT: 17 U/L (ref 0–44)
AST: 24 U/L (ref 15–41)
Albumin: 3.6 g/dL (ref 3.5–5.0)
Alkaline Phosphatase: 72 U/L (ref 38–126)
Anion gap: 8 (ref 5–15)
BUN: 15 mg/dL (ref 8–23)
CO2: 27 mmol/L (ref 22–32)
Calcium: 9.1 mg/dL (ref 8.9–10.3)
Chloride: 102 mmol/L (ref 98–111)
Creatinine: 0.93 mg/dL (ref 0.44–1.00)
GFR, Estimated: >60 mL/min (ref >60)
Glucose, Bld: 70 mg/dL (ref 70–99)
Potassium: 4.8 mmol/L (ref 3.5–5.1)
Sodium: 137 mmol/L (ref 135–145)
Total Bilirubin: 0.9 mg/dL (ref 0.3–1.2)
Total Protein: 7.2 g/dL (ref 6.5–8.1)

## 2020-05-21 LAB — GENETIC SCREENING ORDER

## 2020-05-21 NOTE — Progress Notes (Signed)
Radiation Oncology         [phone] ________________________________  Name: Julie Mcgee        MRN: [id number]  Date of Service: 05/21/2020 DOB: 1933/02/12  CC:Masneri, Adele Barthel, DO  Coralie Keens, MD     REFERRING PHYSICIAN: Coralie Keens, MD   DIAGNOSIS: The primary encounter diagnosis was Malignant neoplasm of ascending colon ([place]). Diagnoses of Malignant neoplasm of upper-inner quadrant of left breast in female, estrogen receptor positive ([place]) and Malignant neoplasm of overlapping sites of right breast [facility]) were also pertinent to this visit.   HISTORY OF PRESENT ILLNESS: Julie Mcgee is a 87 y.o. female seen in the multidisciplinary breast clinic for a new diagnosis of bilateral breast cancer. The patient was noted to have a known breast mass for several years in the right breast that she was evaluated for but declined treatment for. She was evaluated recently and the known palpable right breast mass was present, she had a new left mass as well. Further diagnostic imaging revealed a right breast mass at the 12:00 position measuring 3.2 cm, and a partially visualized node was seen by mammogram but by ultrasound the lesion measured 2.7 cm and she had 8 abnormal appearing right axillary nodes. In the left breast she had a mass in the upper breast at 10:00 with a 5 mm mass and no adenopathy in the left axilla. She had biopsies of both breasts. Her left breast showed a grade 2 invasive ductal carcinoma that was ER/PR positive, HER2 negative with a ki 67 of 10%. Her right breast biopsy showed a grade 2 invasive ductal carcinoma that was ER/PR positive, HER2 negative also and her node was positive in the right axilla for disease. She's seen today to discuss treatment recommendations of her cancer.     PREVIOUS RADIATION THERAPY: No   PAST MEDICAL HISTORY:  Past Medical History:  Diagnosis Date  . Arthritis   . Carotid artery stenosis   . Chronic diastolic CHF (congestive  heart failure) ([place])   . Family history of skin cancer   . Former smoker    1/2 ppd for 45 years  . 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 Hyperlipidemia   . Hypertension   . Hypothyroidism   . PAF (paroxysmal atrial fibrillation) ([place])   . Peripheral vascular disease ([place])   . RBBB   . 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 Severe aortic stenosis    2D echo 02/2019 with severe AS and moderate to severe AI       PAST SURGICAL HISTORY: Past Surgical History:  Procedure Laterality Date  . BASAL CELL CARCINOMA EXCISION  2018  . BIOPSY  05/19/2018   Procedure: BIOPSY;  Surgeon: Lavena Bullion, DO;  Location: [facility];  Service: Gastroenterology;;  . CARDIAC CATHETERIZATION    . COLONOSCOPY  2001  . COLONOSCOPY WITH PROPOFOL N/A 05/19/2018   Procedure: COLONOSCOPY WITH PROPOFOL;  Surgeon: Lavena Bullion, DO;  Location: [facility];  Service: Gastroenterology;  Laterality: N/A;  . ESOPHAGOGASTRODUODENOSCOPY (EGD) WITH PROPOFOL N/A 05/19/2018   Procedure: ESOPHAGOGASTRODUODENOSCOPY (EGD) WITH PROPOFOL;  Surgeon: Lavena Bullion, DO;  Location: [facility];  Service: Gastroenterology;  Laterality: N/A;  . HIP SURGERY  2005  . KNEE SURGERY  1998  . LAPAROSCOPIC PARTIAL COLECTOMY N/A 05/24/2018   Procedure: LAPAROSCOPIC ASSISTED ASCENDING HEMICOLECTOMY;  Surgeon: Ileana Roup, MD;  Location: [facility];  Service: General;  Laterality: N/A;  . PACEMAKER IMPLANT N/A 03/02/2018   Procedure: PACEMAKER IMPLANT;  Surgeon: Evans Lance, MD;  Location: [facility];  Service: Cardiovascular;  Laterality: N/A;  . RIGHT/LEFT HEART CATH AND CORONARY ANGIOGRAPHY N/A 02/21/2018   Procedure: RIGHT/LEFT HEART CATH AND CORONARY ANGIOGRAPHY;  Surgeon: Sherren Mocha, MD;  Location: [facility];  Service:  Cardiovascular;  Laterality: N/A;  . SUBMUCOSAL TATTOO INJECTION  05/19/2018   Procedure: SUBMUCOSAL TATTOO INJECTION;  Surgeon: Lavena Bullion, DO;  Location: [facility];  Service: Gastroenterology;;  . TEE WITHOUT CARDIOVERSION N/A 02/28/2018   Procedure: TRANSESOPHAGEAL ECHOCARDIOGRAM (TEE);  Surgeon: Sherren Mocha, MD;  Location: [facility];  Service: Open Heart Surgery;  Laterality: N/A;  . TEMPORARY PACEMAKER N/A 03/01/2018   Procedure: TEMPORARY PACEMAKER;  Surgeon: Burnell Blanks, MD;  Location: [facility];  Service: Cardiovascular;  Laterality: N/A;  . TONSILLECTOMY    . TRANSCATHETER AORTIC VALVE REPLACEMENT, TRANSFEMORAL N/A 02/28/2018   Procedure: TRANSCATHETER AORTIC VALVE REPLACEMENT, TRANSFEMORAL;  Surgeon: Sherren Mocha, MD;  Location: [facility];  Service: Open Heart Surgery;  Laterality: N/A;     FAMILY HISTORY:  Family History  Problem Relation Age of Onset  . Heart failure Mother   . Chronic Renal Failure Mother   . Alcohol abuse Father   . Skin cancer Sister 42  . Skin cancer Sister 48  . Colon cancer Neg Hx   . Esophageal cancer Neg Hx   . Pancreatic cancer Neg Hx   . Stomach cancer Neg Hx      SOCIAL HISTORY:  reports that she quit smoking about 11 years ago. Her smoking use included cigarettes. She has a 45.00 pack-year smoking history. She has never used smokeless tobacco. She reports previous alcohol use. She reports that she does not use drugs. The patient is married. Her husband is 48, and she has been married about 30 years. She enjoys crocheting. She is accompanied by her son Julie Mcgee "Julie Mcgee" and his wife Julie Mcgee   ALLERGIES: Adhesive [tape]   MEDICATIONS:  Current Outpatient Medications  Medication Sig Dispense Refill  . amoxicillin (AMOXIL) 500 MG tablet Please take 2,000 mg (4 tablets) one hour prior to all dental procedures. 8 tablet 8  . apixaban (ELIQUIS) 5 MG TABS tablet Take 1 tablet (5 mg total) by mouth 2  (two) times daily. 180 tablet 1  . Calcium Carb-Cholecalciferol (CALTRATE 600+D3 PO) Take 1 tablet by mouth 2 (two) times daily.    . cholestyramine (QUESTRAN) 4 g packet Take 1 packet (4 g total) by mouth daily. Take 2 hour prior to morning meal 30 each 11  . ferrous sulfate 325 (65 FE) MG tablet Take 1 tablet (325 mg total) by mouth every other day. 15 tablet 0  . furosemide (LASIX) 20 MG tablet Take 1 tablet (20 mg total) by mouth daily as needed for fluid or edema. 90 tablet 3  . levothyroxine (SYNTHROID) 100 MCG tablet Take 100 mcg by mouth daily before breakfast.     . metoprolol tartrate (LOPRESSOR) 50 MG tablet Take 50 mg by mouth 2 (two) times daily.    . Multiple Vitamins-Minerals (MULTIVITAMIN ADULTS 50+) TABS Take 1 tablet by mouth daily.    . pantoprazole (PROTONIX) 40 MG tablet Take 1 tablet (40 mg total) by mouth daily. 30 tablet 0  . Probiotic Product (PROBIOTIC DAILY PO) Take 1 tablet by  mouth daily.    . simvastatin (ZOCOR) 40 MG tablet Take 40 mg by mouth daily.     No current facility-administered medications for this encounter.     REVIEW OF SYSTEMS: On review of systems, the patient reports that she is doing well overall. She has multiple chronic comorbidities and has long term weakness of her right arm from her prior stroke. She has trouble with back pain and joint pain as well as difficulty walking. She admits to forgetfulness and easy bruising. She does have some hearing loss, and also occasional urinary incontinence. She does have breast pain in the right breast with the biopsies and has developed discomfort from a hematoma. No other complaints are noted.    PHYSICAL EXAM:  Wt Readings from Last 3 Encounters:  02/22/20 185 lb (83.9 kg)  11/28/19 191 lb (86.6 kg)  11/02/19 191 lb 6 oz (86.8 kg)   Temp Readings from Last 3 Encounters:  11/28/19 97.8 F (36.6 C) (Temporal)  11/03/18 (!) 97.3 F (36.3 C) (Oral)  10/31/18 98.2 F (36.8 C) (Oral)   BP Readings  from Last 3 Encounters:  11/28/19 (!) 146/68  11/02/19 (!) 146/80  09/03/19 134/66   Pulse Readings from Last 3 Encounters:  02/22/20 (!) 59  11/28/19 60  11/02/19 (!) 59    In general this is a well appearing caucasian female in no acute distress. She's alert and oriented x4 and appropriate throughout the examination. Cardiopulmonary assessment is negative for acute distress and she exhibits normal effort. Bilateral breast exam is deferred.    ECOG = 2  0 - Asymptomatic (Fully active, able to carry on all predisease activities without restriction)  1 - Symptomatic but completely ambulatory (Restricted in physically strenuous activity but ambulatory and able to carry out work of a light or sedentary nature. For example, light housework, office work)  2 - Symptomatic, <50% in bed during the day (Ambulatory and capable of all self care but unable to carry out any work activities. Up and about more than 50% of waking hours)  3 - Symptomatic, >50% in bed, but not bedbound (Capable of only limited self-care, confined to bed or chair 50% or more of waking hours)  4 - Bedbound (Completely disabled. Cannot carry on any self-care. Totally confined to bed or chair)  5 - Death   Eustace Pen MM, Creech RH, Tormey DC, et al. [phone]). "Toxicity and response criteria of the [facility]". [place] Oncol. 5 (6): 649-55    LABORATORY DATA:  Lab Results  Component Value Date   WBC 8.0 11/02/2018   HGB 11.5 (L) 11/02/2018   HCT 37.0 11/02/2018   MCV 91.6 11/02/2018   PLT 233 11/02/2018   Lab Results  Component Value Date   NA 140 11/02/2018   K 3.3 (L) 11/02/2018   CL 112 (H) 11/02/2018   CO2 21 (L) 11/02/2018   Lab Results  Component Value Date   ALT 13 11/01/2018   AST 26 11/01/2018   ALKPHOS 57 11/01/2018   BILITOT 0.8 11/01/2018      RADIOGRAPHY: No results found.     IMPRESSION/PLAN: 1. Stage IIA, cT2N1M0 grade 2 ER/PR positive invasive ductal  carcinoma of the right breast, with a synchronous Stage IA, cT1aN0M0 grade 2 ER/PR positive invasive ductal carcinoma of the left breast. Dr. Lisbeth Renshaw discusses the pathology findings and reviews the nature of bilateral breast disease. The consensus from the breast conference includes staging CT scans and bone scans. She  is a candidate for surgery of each breast, but is considering her options overall.  If she undergoes surgery and desires treatment, she would benefit with adjuvant external radiotherapy to the right breast and regional nodes, as well as on the left breast if she again proceeds with surgery. The role of radiotherapy would be to reduce risks of local recurrence followed by antiestrogen therapy. We discussed the risks, benefits, short, and long term effects of radiotherapy, as well as the curative intent, and the patient is interested in seeing her staging work up. Dr. Lisbeth Renshaw discusses the delivery and logistics of radiotherapy and anticipates a course of 6 1/2 weeks of radiotherapy to the right chest wall and regional nodes, possibly to the left breast if she has conserving surgery. We will follow up with her further decision making and meet back as appropriate.   In a visit lasting 45 minutes, greater than 50% of the time was spent face to face reviewing her case, as well as in preparation of, discussing, and coordinating the patient's care.  The above documentation reflects my direct findings during this shared patient visit. Please see the separate note by Dr. Lisbeth Renshaw on this date for the remainder of the patient's plan of care.    Carola Rhine, [facility]    **Disclaimer: This note was dictated with voice recognition software. Similar sounding words can inadvertently be transcribed and this note may contain transcription errors which may not have been corrected upon publication of note.**

## 2020-05-21 NOTE — Progress Notes (Signed)
[place]   [phone] Fax:([phone] [place] Note   Patient Care Team: Masneri, Adele Barthel, DO as PCP - General (Family Medicine) Revankar, Reita Cliche, MD as PCP - Cardiology (Cardiology) Sherren Mocha, MD as PCP - Structural Heart (Cardiology) Evans Lance, MD as PCP - Electrophysiology (Cardiology) Rosalin Hawking, MD as Consulting Physician (Neurology) Mauro Kaufmann, RN as Oncology Nurse Navigator Rockwell Germany, RN as Oncology Nurse Navigator Coralie Keens, MD as Consulting Physician (General Surgery) Truitt Merle, MD as Consulting Physician (Hematology) Kyung Rudd, MD as Consulting Physician (Radiation Oncology)  Date of Service:  05/21/2020   CHIEF COMPLAINTS/PURPOSE OF CONSULTATION:  Newly diagnosed bilateral Breast cancer    Oncology History Overview Note  Cancer Staging Malignant neoplasm of ascending colon [facility]) Staging form: Colon and Rectum, AJCC 8th Edition - Pathologic stage from 05/24/2018: Stage I (pT1, pN0, cM0) - Signed by Tish Men, MD on 06/07/2018 Total positive nodes: 0  Malignant neoplasm of overlapping sites of right breast [facility]) Staging form: Breast, AJCC 8th Edition - Clinical stage from 05/21/2020: Stage IIA (cT2, cN1(f), cM0, G2, ER+, PR+, HER2-) - Unsigned Stage prefix: Initial diagnosis Method of lymph node assessment: Core biopsy Histologic grading system: 3 grade system  Malignant neoplasm of upper-inner quadrant of left breast in female, estrogen receptor positive ([place]) Staging form: Breast, AJCC 8th Edition - Clinical stage from 05/21/2020: Stage IA (cT1a, cN0, cM0, G2, ER+, PR+, HER2-) - Unsigned Stage prefix: Initial diagnosis Method of lymph node assessment: Clinical Histologic grading system: 3 grade system    Malignant neoplasm of ascending colon [facility])   Initial Diagnosis   Malignant neoplasm of ascending colon ([place])   05/19/2018 Procedure   Colonoscopy: - Likely malignant tumor in  the ascending colon. Biopsied. Tattooed. This is the source of the anemia. - A few 2 to 5 mm polyps in the rectum, in the sigmoid colon and in the ascending colon. - Non-bleeding internal hemorrhoids. - The examined portion of the ileum was normal.   05/19/2018 Pathology Results   1. Duodenum, Biopsy - BENIGN SMALL BOWEL MUCOSA. - NO VILLOUS BLUNTING OR INCREASE IN INTRAEPITHELIAL LYMPHOCYTES. - NO DYSPLASIA OR MALIGNANCY. 2. Stomach, polyp(s) - FUNDIC GLAND POLYP. - WARTHIN-STARRY IS NEGATIVE FOR HELICOBACTER PYLORI. - NO INTESTINAL METAPLASIA, DYSPLASIA, OR MALIGNANCY. 3. Colon, biopsy, ascending - INVASIVE ADENOCARCINOMA.   05/20/2018 Imaging   CT CAP: IMPRESSION: 1. No CT findings of the abdomen or pelvis to localize suspected GI bleeding. No intraluminal contrast noted.  2. Focal soft tissue thickening of the colon wall superior to the ileocecal valve may reflect reported ascending colon mass.  3. There is extensive fatty mural stratification of the cecum, suggesting chronic inflammatory sequelae of colitis, including Crohn's disease.  4. Moderate right, small left pleural effusions and associated atelectasis or consolidation. There is mild, diffuse interlobular septal thickening, most consistent with edema.  5.  Interval placement of aortic valve stent endograft.  6. There is a lobulated 2.6 cm mass in the central right breast (series 3, image 39), as seen on prior examination. Correlate with mammography.  7.  Other chronic and incidental findings as detailed above.   05/24/2018 Pathology Results   Accession: [id number]  Procedure: Right colon and ileum resection. Tumor Site: Proximal right colon. Tumor Size: 2.5 cm. Macroscopic Tumor Perforation: Not identified. Histologic Type: Invasive adenocarcinoma. Histologic Grade: G2, moderately differentiated. Tumor Extension: Into submucosa. Margins: Negative. Treatment Effect: N/A. Lymphovascular Invasion:  N/A. Perineural Invasion: N/A.  Tumor Deposits: Not identified. Regional Lymph Nodes: Number of Lymph Nodes Involved: 0 Number of Lymph Nodes Examined: 18 Pathologic Stage Classification (pTNM, AJCC 8th Edition): pT1, pN0 Ancillary Studies: MSI will be ordered. MMR was performed on the biopsy and showed loss of MLH1 and PMS2.  MSI-high, BRAF [id number] mutation present    05/24/2018 Cancer Staging   Staging form: Colon and Rectum, AJCC 8th Edition - Pathologic stage from 05/24/2018: Stage I (pT1, pN0, cM0) - Signed by Tish Men, MD on 06/07/2018   Malignant neoplasm of upper-inner quadrant of left breast in female, estrogen receptor positive ([place])  04/28/2020 Mammogram   Mammogram 04/28/20  The 3.2cm irregular mass in the right breast at 12:00 position middle depth, 5cmfn, is highly suggesitve of malignancy. An US guided biopsy is recommended.   the 0.6cm mass in the left breast upper inner aspect posterior depth, 11cmfn is indetermineate. There also are fine branching linear calcifications spanning 1.4cm in teh left breast at 6:00 position middle depth 8.5cmfn. An [place] is reocmmended.      05/14/2020 Initial Biopsy   1.Breast, Left, needle core biopsy, 10:00 position, middle depth, 8cmfn -INVASIVE DUCTAL CARCINOMA 2. breast, right, needle core biopsy, 12:00 position, middle depth, 8cmfn -INVASIVE DUCTAL CARCINOMA 3. Lymph node, biopsy, axillary -METASTATIC CARCINOMA INVOLVING NODAL TISSUE 4. Breast, left needle core biopsy, 6:00 position, 7.3cmfn -FOCAL ATYPICAL DUCTAL HYPERPLASIA WITH CALCIFICATIONS.   1. Grade 2-3 measures 0.4cm  2. Grade 2-3 and measurers 1cm    05/16/2020 Initial Diagnosis   Malignant neoplasm of upper-inner quadrant of left breast in female, estrogen receptor positive ([place])   Malignant neoplasm of upper-outer quadrant of right breast in female, estrogen receptor positive ([place])  04/28/2020 Mammogram   Mammogram 04/28/20  The 3.2cm irregular mass in the right breast at  12:00 position middle depth, 5cmfn, is highly suggesitve of malignancy. An US guided biopsy is recommended.   the 0.6cm mass in the left breast upper inner aspect posterior depth, 11cmfn is indetermineate. There also are fine branching linear calcifications spanning 1.4cm in teh left breast at 6:00 position middle depth 8.5cmfn. An [place] is reocmmended.      05/14/2020 Initial Biopsy   1.Breast, Left, needle core biopsy, 10:00 position, middle depth, 8cmfn -INVASIVE DUCTAL CARCINOMA 2. breast, right, needle core biopsy, 12:00 position, middle depth, 8cmfn -INVASIVE DUCTAL CARCINOMA 3. Lymph node, biopsy, axillary -METASTATIC CARCINOMA INVOLVING NODAL TISSUE 4. Breast, left needle core biopsy, 6:00 position, 7.3cmfn -FOCAL ATYPICAL DUCTAL HYPERPLASIA WITH CALCIFICATIONS.   1. Grade 2-3 measures 0.4cm  2. Grade 2-3 and measurers 1cm    05/21/2020 Initial Diagnosis   Malignant neoplasm of upper-outer quadrant of right breast in female, estrogen receptor positive ([place])      HISTORY OF PRESENTING ILLNESS: 2 Julie Mcgee 87 y.o. female is a here because of B/l breast cancer. The patient presents to the clinic today accompanied by her son and daughter-in-law. Per her son, she is hard of hearing and has some processing deficiencies.   She noticed right breast mass 2 years ago when she lives in [place]. There was work up done, no biopsy but does not have records of this. This mass was also seen with early stage colon cancer work up and diagnosis in 2020, but she opted to wait on more work up for this. After start of left nipple inversion, Her breast mass was seen to be larger on scan. Work up showed she now has b/l breast cancer, metastatic to left LN.  Today she notes  recent onset of back pain in the past 2 weeks. She did not note to her family. Her pain is 5/10. She has not taken OTC pain med for this.   She has a PMHx of b/l total knee replacement. She had stroke in 2011 and she ambulates with  walker at home. She has had multiple falls. She is on Eliquis. She has residual right hand weakness, which has recovered mostly well. She had TAVR surgery before COVID. She has CHF and CAD. She was diagnosed with Colon cancer diagnosed in 05/2018 and was treated with surgery only. I reviewed her medication list with family and patient. She denies family history of cancer.   Socially she lives with her husband in independent living and they plan to move to assisted living. She has good support from her son and his wife. She uses to smoke and drink but stopped after stroke.     GYN HISTORY  Menarchal: NA LMP: NA Contraceptive: No  HRT: No G4P: first at age 89   REVIEW OF SYSTEMS:    Constitutional: Denies fevers, chills or abnormal night sweats Eyes: Denies blurriness of vision, double vision or watery eyes Ears, nose, mouth, throat, and face: Denies mucositis or sore throat Respiratory: Denies cough, dyspnea or wheezes Cardiovascular: Denies palpitation, chest discomfort or lower extremity swelling Gastrointestinal: Denies nausea, heartburn or change in bowel habits Skin: Denies abnormal skin rashes MSK: (+) Back pain  Lymphatics: Denies new lymphadenopathy or easy bruising Neurological:Denies numbness, tingling or new weaknesses Behavioral/Psych: Mood is stable, no new changes  Breast: (+) Palpable right breast mass All other systems were reviewed with the patient and are negative.   MEDICAL HISTORY:  Past Medical History:  Diagnosis Date  . Arthritis   . Carotid artery stenosis   . Chronic diastolic CHF (congestive heart failure) ([place])   . Colon cancer ([place])   . Family history of skin cancer   . Former smoker    1/2 ppd for 45 years  . 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 Hyperlipidemia   . Hypertension   . Hypothyroidism   . PAF (paroxysmal atrial fibrillation) ([place])   . Peripheral vascular disease ([place])   . RBBB   . 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 Severe aortic stenosis    2D echo 02/2019 with severe AS and moderate to severe AI    SURGICAL HISTORY: Past Surgical History:  Procedure Laterality Date  . BASAL CELL CARCINOMA EXCISION  2018  . BIOPSY  05/19/2018   Procedure: BIOPSY;  Surgeon: Lavena Bullion, DO;  Location: [facility];  Service: Gastroenterology;;  . CARDIAC CATHETERIZATION    . COLONOSCOPY  2001  . COLONOSCOPY WITH PROPOFOL N/A 05/19/2018   Procedure: COLONOSCOPY WITH PROPOFOL;  Surgeon: Lavena Bullion, DO;  Location: [facility];  Service: Gastroenterology;  Laterality: N/A;  . ESOPHAGOGASTRODUODENOSCOPY (EGD) WITH PROPOFOL N/A 05/19/2018   Procedure: ESOPHAGOGASTRODUODENOSCOPY (EGD) WITH PROPOFOL;  Surgeon: Lavena Bullion, DO;  Location: [facility];  Service: Gastroenterology;  Laterality: N/A;  . HIP SURGERY  2005  . KNEE SURGERY  1998  . LAPAROSCOPIC PARTIAL COLECTOMY N/A 05/24/2018   Procedure: LAPAROSCOPIC ASSISTED ASCENDING HEMICOLECTOMY;  Surgeon: Ileana Roup, MD;  Location: [facility];  Service: General;  Laterality: N/A;  . PACEMAKER IMPLANT N/A 03/02/2018   Procedure: PACEMAKER IMPLANT;  Surgeon:  Evans Lance, MD;  Location: [facility] [facility];  Service: Cardiovascular;  Laterality: N/A;  . RIGHT/LEFT HEART CATH AND CORONARY ANGIOGRAPHY N/A 02/21/2018   Procedure: RIGHT/LEFT HEART CATH AND CORONARY ANGIOGRAPHY;  Surgeon: Sherren Mocha, MD;  Location: [place] [facility];  Service: Cardiovascular;  Laterality: N/A;  . SUBMUCOSAL TATTOO INJECTION  05/19/2018   Procedure: SUBMUCOSAL TATTOO INJECTION;  Surgeon: Lavena Bullion, DO;  Location: [facility];  Service: Gastroenterology;;  . TEE WITHOUT CARDIOVERSION N/A 02/28/2018   Procedure: TRANSESOPHAGEAL ECHOCARDIOGRAM (TEE);  Surgeon: Sherren Mocha, MD;  Location: [facility] [facility];   Service: Open Heart Surgery;  Laterality: N/A;  . TEMPORARY PACEMAKER N/A 03/01/2018   Procedure: TEMPORARY PACEMAKER;  Surgeon: Burnell Blanks, MD;  Location: [place] [facility];  Service: Cardiovascular;  Laterality: N/A;  . TONSILLECTOMY    . TRANSCATHETER AORTIC VALVE REPLACEMENT, TRANSFEMORAL N/A 02/28/2018   Procedure: TRANSCATHETER AORTIC VALVE REPLACEMENT, TRANSFEMORAL;  Surgeon: Sherren Mocha, MD;  Location: [place] [facility];  Service: Open Heart Surgery;  Laterality: N/A;    SOCIAL HISTORY: Social History   Socioeconomic History  . Marital status: Married    Spouse name: Not on file  . Number of children: 4  . Years of education: Not on file  . Highest education level: Some college, no degree  Occupational History  . Not on file  Tobacco Use  . Smoking status: Former Smoker    Packs/day: 1.00    Years: 45.00    Pack years: 45.00    Types: Cigarettes    Quit date: 05/23/2009    Years since quitting: 11.0  . Smokeless tobacco: Never Used  Vaping Use  . Vaping Use: Never used  Substance and Sexual Activity  . Alcohol use: Not Currently  . Drug use: Never  . Sexual activity: Not on file  Other Topics Concern  . Not on file  Social History Narrative   [facility] she has 4 children   She is right handed   Drinks coffee sometimes and sometimes tea and soda   Social Determinants of Radio broadcast assistant Strain: Not on file  Food Insecurity: Not on file  Transportation Needs: Not on file  Physical Activity: Not on file  Stress: Not on file  Social Connections: Not on file  Intimate Partner Violence: Not on file    FAMILY HISTORY: Family History  Problem Relation Age of Onset  . Heart failure Mother   . Chronic Renal Failure Mother   . Alcohol abuse Father   . Skin cancer Sister 67  . Skin cancer Sister 43  . Colon cancer Neg Hx   . Esophageal cancer Neg Hx   . Pancreatic cancer Neg Hx   .  Stomach cancer Neg Hx     ALLERGIES:  is allergic to adhesive [tape].  MEDICATIONS:  Current Outpatient Medications  Medication Sig Dispense Refill  . amoxicillin (AMOXIL) 500 MG tablet Please take 2,000 mg (4 tablets) one hour prior to all dental procedures. 8 tablet 8  . apixaban (ELIQUIS) 5 MG TABS tablet Take 1 tablet (5 mg total) by mouth 2 (two) times daily. 180 tablet 1  . Calcium Carb-Cholecalciferol (CALTRATE 600+D3 PO) Take 1 tablet by mouth 2 (two) times daily.    . cholestyramine (QUESTRAN) 4 g packet Take 1 packet (4 g total) by mouth daily. Take 2 hour prior to morning meal 30 each 11  . furosemide (LASIX) 20 MG  tablet Take 1 tablet (20 mg total) by mouth daily as needed for fluid or edema. 90 tablet 3  . levothyroxine (SYNTHROID) 100 MCG tablet Take 100 mcg by mouth daily before breakfast.     . metoprolol tartrate (LOPRESSOR) 50 MG tablet Take 50 mg by mouth 2 (two) times daily.    . Multiple Vitamins-Minerals (MULTIVITAMIN ADULTS 50+) TABS Take 1 tablet by mouth daily.    . pantoprazole (PROTONIX) 40 MG tablet Take 1 tablet (40 mg total) by mouth daily. 30 tablet 0  . Probiotic Product (PROBIOTIC DAILY PO) Take 1 tablet by mouth daily.    . simvastatin (ZOCOR) 40 MG tablet Take 40 mg by mouth daily.     No current facility-administered medications for this visit.    PHYSICAL EXAMINATION: ECOG PERFORMANCE STATUS: 3 - Symptomatic, >50% confined to bed  Vitals:   05/21/20 1242  BP: (!) 158/80  Pulse: 62  Resp: 18  Temp: 97.7 F (36.5 C)  SpO2: 98%   Filed Weights   05/21/20 1242  Weight: 208 lb 6.4 oz (94.5 kg)    GENERAL:alert, no distress and comfortable SKIN: skin color, texture, turgor are normal, no rashes or significant lesions EYES: normal, Conjunctiva are pink and non-injected, sclera clear  NECK: supple, thyroid normal size, non-tender, without nodularity LYMPH:  no palpable lymphadenopathy in the cervical, axillary  LUNGS: clear to auscultation and  percussion with normal breathing effort HEART: regular rate & rhythm and no murmurs and no lower extremity edema ABDOMEN:abdomen soft, non-tender and normal bowel sounds Musculoskeletal:no cyanosis of digits and no clubbing  NEURO: alert & oriented x 3 with fluent speech, no focal motor/sensory deficits BREAST: (+)Multiple palpable right axillary LN up to 2cm, movable (+) Large extensive skin ecchymosis of b/l breast (+) 7x5.5cm palpable mass in right breast 12-1:00 position.  EXAM PERFORMED IN WHEELCHAIR TODAY   LABORATORY DATA:  I have reviewed the data as listed CBC Latest Ref Rng & Units 05/21/2020 11/02/2018 11/01/2018  WBC 4.0 - 10.5 K/uL 7.9 8.0 9.5  Hemoglobin 12.0 - 15.0 g/dL 13.0 11.5(L) 13.2  Hematocrit 36.0 - 46.0 % 39.9 37.0 42.9  Platelets 150 - 400 K/uL 221 233 282    CMP Latest Ref Rng & Units 05/21/2020 11/02/2018 11/01/2018  Glucose 70 - 99 mg/dL 70 95 87  BUN 8 - 23 mg/dL '15 16 21  ' Creatinine 0.44 - 1.00 mg/dL 0.93 0.95 1.05(H)  Sodium 135 - 145 mmol/L 137 140 140  Potassium 3.5 - 5.1 mmol/L 4.8 3.3(L) 4.2  Chloride 98 - 111 mmol/L 102 112(H) 106  CO2 22 - 32 mmol/L 27 21(L) 23  Calcium 8.9 - 10.3 mg/dL 9.1 8.5(L) 9.3  Total Protein 6.5 - 8.1 g/dL 7.2 - 6.4(L)  Total Bilirubin 0.3 - 1.2 mg/dL 0.9 - 0.8  Alkaline Phos 38 - 126 U/L 72 - 57  AST 15 - 41 U/L 24 - 26  ALT 0 - 44 U/L 17 - 13     RADIOGRAPHIC STUDIES: I have personally reviewed the radiological images as listed and agreed with the findings in the report. No results found.  ASSESSMENT & PLAN:  Julie Mcgee is a 87 y.o. Caucasian female with a history of Arthritis, CAD, s/p TAVR in 02/2018, CHF, H/o Smoking, H.o CVA, HLD, HTN, Hypothyroidism, Afib, Peripheral vascular disease.    1. Right breast cancer, Stage IIA, c(T2N1M0), ER+/PR+/HER2-, Grade II-III and Left breast cancer, Stage IA, c(T2N0M0), ER+/PR+/HER2-, Grade II-III  -We discussed her image findings and the  biopsy results in great details.  She has had palpable right breast mass for about 5 years, even when she lived in [place]. This was seen on CT scan at time of colon cancer diagnosis in 2020, but patient declined work up at that time.  -With new left breast nipple inversion, she proceeded with breast work up which showed larger right breast mass, extensive right axillary adnopathy, and a small left breast mass. Her 05/14/20 breast biopsy showed Invasive ductal carcinoma in right and left breast and positive right axillary LNs with grade 2-3 disease.  -Given the extent tumor burden in her breasts and positive LN, I recommend CT CAP and bone scan to evaluate for distant metastasis. She agreed. -Her treatment plan will be based on further work up. I discussed surgery is the only curable option, but she will require extensive surgery (right mastectomy, axillary lymph node dissection, and left lumpectomy).  Given her age and comorbidities, I do not recommend chemotherapy even if she has multiple nodes metastasis.  Adjuvant radiation is typically recommended due to nodal metastasis.  She will discuss further with Dr Lisbeth Renshaw today.  -Given ER/PR positive disease, she is eligible for antiestrogen therapy with Tamoxifen or anastrozole, as main treatment or before/after surgery. If she is found to have distant metastatic disease, there are options of Ibrance or Verzenio in addition to antiestrogen therapy. I reviewed side effects with family and patient extensively today.  -I discussed mastectomy is possible for her but her recovery may be slow.  Given her advanced age and medical comorbidities, it is also reasonable to put her on antiestrogen therapy indefinitely without surgery for disease control (not cure), I think the possibility of dying from metastatic breast cancer in the next 5 years is not very high.  After lengthy discussion, she opted to start with Tamoxifen/Anastrozole to shrink mass. She may consider surgery. May consider additional treatment  if tolerable and beneficial. Her family is currently concerned about her ability to tolerate extensive surgery in the multimodality treatment.  -Labs from today show, CBC and CMP WNL. Her right breast mass and right axillary LN was palpable on exam.  -I will f/u with phone call in 2 weeks with son and patient.    2. Recent H/o early Stage colon cancer, pT1N0M0 -She was diagnosed with colon cancer in 05/2018.  -She was treated with colectomy alone. She will continue cancer surveillance.    3. Commordities: H/o CVA in 2011, CAD, s/p TAVR in 02/2018, CHF, HTN, Hypothyroidism, Afib, Peripheral vascular disease. -She has multiple comorbidities, which is managed by her other physicians.  -Her 02/2019 Echo showed EF improved to 65-70%.  -She is on Eliquis since Stroke. She had significant bleeding s/p breast biopsy.  -She has residual right hand weakness, and her ambulation has decreased over the years. She has had frequent falls and currently ambulates with walker.  -Will obtain new baseline DEXA to determine antiestrogen therapy options. She is agreeable.    4. Social support  -She lives with her elderly husband (21s) in independently living. His mobility is lower than hers They plan to move to assisted living soon.  -She has good support from her son and his wife who are her primary care givers. They wish to be heavily involved in her care. 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     PLAN:  -CT CAP w contrast and bone scan in 1-2 weeks for staging  -DEXA in 1-2 weeks  -Phone call in 2  weeks with son/patient    No orders of the defined types were placed in this encounter.   All questions were answered. The patient knows to call the clinic with any problems, questions or concerns. The total time spent in the appointment was 60 minutes.     Truitt Merle, MD 05/21/2020   I, Joslyn Devon, am acting as scribe for Truitt Merle, MD.   I have reviewed the above documentation for  accuracy and completeness, and I agree with the above.

## 2020-05-22 NOTE — Progress Notes (Signed)
[facility] Psychosocial Distress Screening Spiritual Care  Met with Julie Mcgee and her son and daughter-in-law Secundino Ginger in [facility] to introduce [facility] team/resources, reviewing distress screen per protocol.  The patient scored a 1 on the Psychosocial Distress Thermometer which indicates mild distress. Also assessed for distress and other psychosocial needs.   ONCBCN DISTRESS SCREENING 05/22/2020  Screening Type Initial Screening  Distress experienced in past week (1-10) 1  Family Problem type Partner  Information Concerns Type Lack of info about diagnosis;Lack of info about treatment;Lack of info about complementary therapy choices;Lack of info about maintaining fitness  Referral to support programs Yes   Ms Fenech's biggest stressor is worrying that her husband may becoming injured in a fall at home; they are moving into an assisted living facility in two weeks and hope that this will stabilize their situation.  Follow up needed: Yes.  We plan to follow up by phone in a month for pastoral check-in about her treatment and their transition.   [place], [place], [facility] Pager [phone] Voicemail [phone]

## 2020-05-27 ENCOUNTER — Ambulatory Visit (HOSPITAL_COMMUNITY): Payer: Medicare Other

## 2020-05-27 ENCOUNTER — Other Ambulatory Visit: Payer: Self-pay | Admitting: Hematology

## 2020-05-27 ENCOUNTER — Telehealth: Payer: Self-pay | Admitting: *Deleted

## 2020-05-27 ENCOUNTER — Encounter: Payer: Self-pay | Admitting: *Deleted

## 2020-05-27 DIAGNOSIS — C50811 Malignant neoplasm of overlapping sites of right female breast: Secondary | ICD-10-CM

## 2020-05-27 NOTE — Telephone Encounter (Signed)
Left message for a return phone call to follow up from [facility] 5/11 and assess navigation needs and give information on a an appointment change.   Also left messages for her son about having to cancel CT/bone scan due to contrast shortage but was able to schedule PET scan on same day.

## 2020-05-27 NOTE — Telephone Encounter (Signed)
Received call back from her son [name] and her appt for PET scan is confirmed. Gave him instructions. He said he would be out of town Friday but would be available by phone for results if they become available.  Dr. Burr Medico notified. He would prefer not to bring her in due to her mobility issues and driving distance.

## 2020-05-28 ENCOUNTER — Encounter: Payer: Self-pay | Admitting: *Deleted

## 2020-05-29 ENCOUNTER — Ambulatory Visit (HOSPITAL_COMMUNITY): Payer: Medicare Other

## 2020-05-29 ENCOUNTER — Encounter (HOSPITAL_COMMUNITY)
Admission: RE | Admit: 2020-05-29 | Discharge: 2020-05-29 | Disposition: A | Discharge status: Home / Self Care | Payer: Medicare Other | Source: Ambulatory Visit | Attending: Hematology | Admitting: Hematology

## 2020-05-29 ENCOUNTER — Other Ambulatory Visit: Payer: Self-pay

## 2020-05-29 ENCOUNTER — Encounter (HOSPITAL_COMMUNITY): Payer: Medicare Other

## 2020-05-29 DIAGNOSIS — C50811 Malignant neoplasm of overlapping sites of right female breast: Secondary | ICD-10-CM | POA: Diagnosis not present

## 2020-05-29 DIAGNOSIS — N6320 Unspecified lump in the left breast, unspecified quadrant: Secondary | ICD-10-CM | POA: Insufficient documentation

## 2020-05-29 DIAGNOSIS — C773 Secondary and unspecified malignant neoplasm of axilla and upper limb lymph nodes: Secondary | ICD-10-CM | POA: Insufficient documentation

## 2020-05-29 LAB — GLUCOSE, CAPILLARY: Glucose-Capillary: 59 mg/dL — ABNORMAL LOW (ref 70–99)

## 2020-05-29 MED ORDER — FLUDEOXYGLUCOSE F - 18 (FDG) INJECTION
11.2000 | Freq: Once | INTRAVENOUS | Status: AC
  Administered 2020-05-29: 11.2 via INTRAVENOUS

## 2020-06-02 ENCOUNTER — Telehealth: Payer: Self-pay | Admitting: Hematology

## 2020-06-02 ENCOUNTER — Encounter (HOSPITAL_BASED_OUTPATIENT_CLINIC_OR_DEPARTMENT_OTHER): Payer: Medicare Other | Attending: Internal Medicine | Admitting: Internal Medicine

## 2020-06-02 ENCOUNTER — Other Ambulatory Visit: Payer: Self-pay

## 2020-06-02 ENCOUNTER — Other Ambulatory Visit: Payer: Self-pay | Admitting: Hematology

## 2020-06-02 ENCOUNTER — Encounter: Payer: Self-pay | Admitting: Hematology

## 2020-06-02 ENCOUNTER — Encounter: Payer: Self-pay | Admitting: *Deleted

## 2020-06-02 DIAGNOSIS — L89301 Pressure ulcer of unspecified buttock, stage 1: Secondary | ICD-10-CM | POA: Insufficient documentation

## 2020-06-02 DIAGNOSIS — Z950 Presence of cardiac pacemaker: Secondary | ICD-10-CM | POA: Insufficient documentation

## 2020-06-02 DIAGNOSIS — C182 Malignant neoplasm of ascending colon: Secondary | ICD-10-CM | POA: Diagnosis not present

## 2020-06-02 DIAGNOSIS — Z952 Presence of prosthetic heart valve: Secondary | ICD-10-CM | POA: Diagnosis not present

## 2020-06-02 DIAGNOSIS — Z85038 Personal history of other malignant neoplasm of large intestine: Secondary | ICD-10-CM | POA: Insufficient documentation

## 2020-06-02 DIAGNOSIS — Z9049 Acquired absence of other specified parts of digestive tract: Secondary | ICD-10-CM | POA: Insufficient documentation

## 2020-06-02 DIAGNOSIS — I10 Essential (primary) hypertension: Secondary | ICD-10-CM | POA: Insufficient documentation

## 2020-06-02 MED ORDER — TAMOXIFEN CITRATE 20 MG PO TABS: 20.0000 mg | ORAL_TABLET | Freq: Every day | ORAL | 3 refills | Status: DC

## 2020-06-02 NOTE — Progress Notes (Signed)
SUEKO, DIMICHELE ([id number]) Visit Report for 06/02/2020 Chief Complaint Document Details Patient Name: Date of Service: Julie Mcgee, Julie Mcgee 06/02/2020 9:00 A M Medical Record Number: [id number] Patient Account Number: [id number] Date of Birth/Sex: Treating RN: 1933/04/12 (87 y.o. [name] Primary Care Provider: Crissie Sickles Other Clinician: Referring Provider: Treating Provider/Extender: Caroline More in Treatment: 0 Information Obtained from: Patient Chief Complaint buttocks wound Electronic Signature(s) Signed: 06/02/2020 12:56:34 PM By: Kalman Shan DO Entered By: Kalman Shan on 06/02/2020 12:49:05 -------------------------------------------------------------------------------- HPI Details Patient Name: Date of Service: [name] 06/02/2020 9:00 [place] Record Number: [id number] Patient Account Number: [id number] Date of Birth/Sex: Treating RN: November 03, 1933 (87 y.o. [name] Primary Care Provider: Crissie Sickles Other Clinician: Referring Provider: Treating Provider/Extender: Ennis Forts Weeks in Treatment: 0 History of Present Illness HPI Description: Admission 5/23 Ms. Delaney Meigs is an 87 year old female with a past medical history of severe aortic stenosis status post TAVR, complete heart block s/p pacemaker, hypertension, colon CA s/p right hemicolectomy that presents to the clinic for a 38-month history of irritation to her bottom. She states that over the past 4 months she has had to cancel appointments to our clinic because the area wax and wanes and by the time her appointment comes around she is doing better. This time the area did not improve and she presents today for assessment. She has not been using anything to the area. She has a history of colon cancer and since her surgery she has chronic diarrhea. She reports 1-2 episodes weekly. She also sits for long periods of time throughout the  day. She is often on the porch crocheting. She denies signs of infection today. Electronic Signature(s) Signed: 06/02/2020 12:56:34 PM By: Kalman Shan DO Entered By: Kalman Shan on 06/02/2020 12:53:01 -------------------------------------------------------------------------------- Physical Exam Details Patient Name: Date of Service: JENEL, GIERKE 06/02/2020 9:00 A M Medical Record Number: [id number] Patient Account Number: [id number] Date of Birth/Sex: Treating RN: 1933-12-18 (87 y.o. [name] Primary Care Provider: Other Clinician: Crissie Sickles Referring Provider: Treating Provider/Extender: Ennis Forts Weeks in Treatment: 0 Constitutional respirations regular, non-labored and within target range for patient.[name] Psychiatric pleasant and cooperative. Notes Buttocks: There is a significant area of irritation that is limited to skin breakdown. No signs of infection noted. There is a questionable area of pressure injury, Although very small. Electronic Signature(s) Signed: 06/02/2020 12:56:34 PM By: Kalman Shan DO Entered By: Kalman Shan on 06/02/2020 12:53:58 -------------------------------------------------------------------------------- Physician Orders Details Patient Name: Date of Service: TARALYNN, QUIETT RO LA N 06/02/2020 9:00 A M Medical Record Number: [id number] Patient Account Number: [id number] Date of Birth/Sex: Treating RN: Jan 04, 1934 (87 y.o. [name] Primary Care Provider: Crissie Sickles Other Clinician: Referring Provider: Treating Provider/Extender: Caroline More in Treatment: 0 Verbal / Phone Orders: No Diagnosis Coding ICD-10 Coding Code Description L89.301 Pressure ulcer of unspecified buttock, stage 1 I10 Essential (primary) hypertension C18.2 Malignant neoplasm of ascending colon Follow-up Appointments ppointment in 1 week. - with Dr. Heber [place] Return A Bathing/ Shower/  Hygiene May shower and wash wound with soap and water. Off-Loading Turn and reposition every 2 hours Wound Treatment Wound #2 - Gluteus Wound Laterality: Left Cleanser: Soap and Water 3 x Per Day/7 Days Discharge Instructions: May shower and wash wound with dial antibacterial soap and water prior to dressing change. Peri-Wound Care: Zinc Oxide Ointment 30g tube 3 x Per Day/7 Days Discharge Instructions: Apply Zinc Oxide  to periwound with each dressing change. Purchase Destin (can be generic brand) over the counter. Secondary Dressing: ABD Pad, 5x9 3 x Per Day/7 Days Discharge Instructions: Apply over primary dressing as directed. Electronic Signature(s) Signed: 06/02/2020 12:56:34 PM By: Kalman Shan DO Entered By: Kalman Shan on 06/02/2020 12:54:15 -------------------------------------------------------------------------------- Problem List Details Patient Name: Date of Service: DEANIE, JUPITER RO LA N 06/02/2020 9:00 A M Medical Record Number: [id number] Patient Account Number: [id number] Date of Birth/Sex: Treating RN: Mar 16, 1933 (87 y.o. [name] Primary Care Provider: Crissie Sickles Other Clinician: Referring Provider: Treating Provider/Extender: Ennis Forts Weeks in Treatment: 0 Active Problems ICD-10 Encounter Code Description Active Date MDM Diagnosis L89.301 Pressure ulcer of unspecified buttock, stage 1 06/02/2020 No Yes I10 Essential (primary) hypertension 06/02/2020 No Yes C18.2 Malignant neoplasm of ascending colon 06/02/2020 No Yes Inactive Problems Resolved Problems Electronic Signature(s) Signed: 06/02/2020 12:56:34 PM By: Kalman Shan DO Entered By: Kalman Shan on 06/02/2020 12:48:27 -------------------------------------------------------------------------------- Progress Note Details Patient Name: Date of Service: [name] 06/02/2020 9:00 A M Medical Record Number: [id number] Patient Account Number:  [id number] Date of Birth/Sex: Treating RN: 1933-11-17 (87 y.o. [name] Primary Care Provider: Crissie Sickles Other Clinician: Referring Provider: Treating Provider/Extender: Ennis Forts Weeks in Treatment: 0 Subjective Chief Complaint Information obtained from Patient buttocks wound History of Present Illness (HPI) Admission 5/23 Ms. Delaney Meigs is an 87 year old female with a past medical history of severe aortic stenosis status post TAVR, complete heart block s/p pacemaker, hypertension, colon CA s/p right hemicolectomy that presents to the clinic for a 64-month history of irritation to her bottom. She states that over the past 4 months she has had to cancel appointments to our clinic because the area wax and wanes and by the time her appointment comes around she is doing better. This time the area did not improve and she presents today for assessment. She has not been using anything to the area. She has a history of colon cancer and since her surgery she has chronic diarrhea. She reports 1-2 episodes weekly. She also sits for long periods of time throughout the day. She is often on the porch crocheting. She denies signs of infection today. Patient History Information obtained from Patient. Allergies adhesive tape Family History Heart Disease - Mother, Kidney Disease - Mother, No family history of Cancer, Diabetes, Hereditary Spherocytosis, Hypertension, Lung Disease, Seizures, Stroke, Thyroid Problems, Tuberculosis. Social History Former smoker, Marital Status - Married, Alcohol Use - Never, Drug Use - No History, Caffeine Use - Rarely. Medical History Eyes Denies history of Cataracts, Glaucoma, Optic Neuritis Ear/Nose/Mouth/Throat Denies history of Chronic sinus problems/congestion, Middle ear problems Hematologic/Lymphatic Denies history of Anemia, Hemophilia, Human Immunodeficiency Virus, Lymphedema, Sickle Cell Disease Respiratory Denies history  of Aspiration, Asthma, Chronic Obstructive Pulmonary Disease (COPD), Pneumothorax, Sleep Apnea, Tuberculosis Cardiovascular Patient has history of Hypertension, Peripheral Venous Disease Denies history of Angina, Arrhythmia, Congestive Heart Failure, Coronary Artery Disease, Deep Vein Thrombosis, Hypotension, Myocardial Infarction, Peripheral Arterial Disease, Phlebitis, Vasculitis Gastrointestinal Denies history of Cirrhosis , Colitis, Crohnoos, Hepatitis A, Hepatitis B, Hepatitis C Endocrine Denies history of Type I Diabetes, Type II Diabetes Genitourinary Denies history of End Stage Renal Disease Immunological Denies history of Lupus Erythematosus, Raynaudoos, Scleroderma Integumentary (Skin) Denies history of History of Burn Musculoskeletal Denies history of Gout, Rheumatoid Arthritis, Osteoarthritis, Osteomyelitis Neurologic Denies history of Dementia, Neuropathy, Quadriplegia, Paraplegia, Seizure Disorder Oncologic Denies history of Received Chemotherapy, Received Radiation Psychiatric Denies history of Anorexia/bulimia, Confinement Anxiety Medical  A Surgical History Notes nd Oncologic currently has breast ca Review of Systems (ROS) Constitutional Symptoms (General Health) Denies complaints or symptoms of Fatigue, Fever, Chills, Marked Weight Change. Ear/Nose/Mouth/Throat Denies complaints or symptoms of Chronic sinus problems or rhinitis. Respiratory Denies complaints or symptoms of Chronic or frequent coughs, Shortness of Breath. Cardiovascular Denies complaints or symptoms of Chest pain. Gastrointestinal Denies complaints or symptoms of Frequent diarrhea, Nausea, Vomiting. Endocrine Denies complaints or symptoms of Heat/cold intolerance. Genitourinary Denies complaints or symptoms of Frequent urination. Integumentary (Skin) Complains or has symptoms of Wounds. Musculoskeletal Denies complaints or symptoms of Muscle Pain, Muscle Weakness. Neurologic Denies  complaints or symptoms of Numbness/parasthesias. Psychiatric Denies complaints or symptoms of Claustrophobia, Suicidal. Objective Constitutional respirations regular, non-labored and within target range for patient.. Vitals Time Taken: 9:35 AM, Height: 64 in, Source: Stated, Weight: 200 lbs, Source: Stated, BMI: 34.3. Psychiatric pleasant and cooperative. General Notes: Buttocks: There is a significant area of irritation that is limited to skin breakdown. No signs of infection noted. There is a questionable area of pressure injury, Although very small. Integumentary (Hair, Skin) Wound #2 status is Open. Original cause of wound was Pressure Injury. The date acquired was: 05/11/2020. The wound is located on the Left Gluteus. The wound measures 1cm length x 1.5cm width x 0.1cm depth; 1.178cm^2 area and 0.118cm^3 volume. There is no tunneling or undermining noted. There is a medium amount of serosanguineous drainage noted. The wound margin is distinct with the outline attached to the wound base. There is no granulation within the wound bed. There is a large (67-100%) amount of necrotic tissue within the wound bed including Eschar and Adherent Slough. Assessment Active Problems ICD-10 Pressure ulcer of unspecified buttock, stage 1 Essential (primary) hypertension Malignant neoplasm of ascending colon Patient presents with a 68-month history of irritation to her bottom that is limited to skin breakdown. There are no signs of infection. There is a lot of moisture noted on exam. I recommended she aggressively use zinc oxide with an ABD pad up to 3 times a day. I also recommended aggressive offloading to help heal the area. There is a small area that is questionable for a pressure injury and we will need to follow this closely. I will see her at follow-up in 1 week. Plan Follow-up Appointments: Return Appointment in 1 week. - with Dr. Heber [place] Bathing/ Shower/ Hygiene: May shower and wash wound with  soap and water. Off-Loading: Turn and reposition every 2 hours WOUND #2: - Gluteus Wound Laterality: Left Cleanser: Soap and Water 3 x Per Day/7 Days Discharge Instructions: May shower and wash wound with dial antibacterial soap and water prior to dressing change. Peri-Wound Care: Zinc Oxide Ointment 30g tube 3 x Per Day/7 Days Discharge Instructions: Apply Zinc Oxide to periwound with each dressing change. Purchase Destin (can be generic brand) over the counter. Secondary Dressing: ABD Pad, 5x9 3 x Per Day/7 Days Discharge Instructions: Apply over primary dressing as directed. 1. Zinc oxide and ABD up to 3 times a day 2. Aggressive offloading. 3. Follow-up in 1 week Electronic Signature(s) Signed: 06/02/2020 12:56:34 PM By: Kalman Shan DO Entered By: Kalman Shan on 06/02/2020 12:56:00 -------------------------------------------------------------------------------- HxROS Details Patient Name: Date of Service: [name] 06/02/2020 9:00 A M Medical Record Number: [id number] Patient Account Number: [id number] Date of Birth/Sex: Treating RN: 1933-10-23 (87 y.o. Tonita Phoenix, Lauren Primary Care Provider: Crissie Sickles Other Clinician: Referring Provider: Treating Provider/Extender: Ennis Forts Weeks in Treatment: 0 Information Obtained From  Patient Constitutional Symptoms (General Health) Complaints and Symptoms: Negative for: Fatigue; Fever; Chills; Marked Weight Change Ear/Nose/Mouth/Throat Complaints and Symptoms: Negative for: Chronic sinus problems or rhinitis Medical History: Negative for: Chronic sinus problems/congestion; Middle ear problems Respiratory Complaints and Symptoms: Negative for: Chronic or frequent coughs; Shortness of Breath Medical History: Negative for: Aspiration; Asthma; Chronic Obstructive Pulmonary Disease (COPD); Pneumothorax; Sleep Apnea; Tuberculosis Cardiovascular Complaints and Symptoms: Negative for:  Chest pain Medical History: Positive for: Hypertension; Peripheral Venous Disease Negative for: Angina; Arrhythmia; Congestive Heart Failure; Coronary Artery Disease; Deep Vein Thrombosis; Hypotension; Myocardial Infarction; Peripheral Arterial Disease; Phlebitis; Vasculitis Gastrointestinal Complaints and Symptoms: Negative for: Frequent diarrhea; Nausea; Vomiting Medical History: Negative for: Cirrhosis ; Colitis; Crohns; Hepatitis A; Hepatitis B; Hepatitis C Endocrine Complaints and Symptoms: Negative for: Heat/cold intolerance Medical History: Negative for: Type I Diabetes; Type II Diabetes Genitourinary Complaints and Symptoms: Negative for: Frequent urination Medical History: Negative for: End Stage Renal Disease Integumentary (Skin) Complaints and Symptoms: Positive for: Wounds Medical History: Negative for: History of Burn Musculoskeletal Complaints and Symptoms: Negative for: Muscle Pain; Muscle Weakness Medical History: Negative for: Gout; Rheumatoid Arthritis; Osteoarthritis; Osteomyelitis Neurologic Complaints and Symptoms: Negative for: Numbness/parasthesias Medical History: Negative for: Dementia; Neuropathy; Quadriplegia; Paraplegia; Seizure Disorder Psychiatric Complaints and Symptoms: Negative for: Claustrophobia; Suicidal Medical History: Negative for: Anorexia/bulimia; Confinement Anxiety Eyes Medical History: Negative for: Cataracts; Glaucoma; Optic Neuritis Hematologic/Lymphatic Medical History: Negative for: Anemia; Hemophilia; Human Immunodeficiency Virus; Lymphedema; Sickle Cell Disease Immunological Medical History: Negative for: Lupus Erythematosus; Raynauds; Scleroderma Oncologic Medical History: Negative for: Received Chemotherapy; Received Radiation Past Medical History Notes: currently has breast ca Immunizations Pneumococcal Vaccine: Received Pneumococcal Vaccination: No Implantable Devices None Family and Social  History Cancer: No; Diabetes: No; Heart Disease: Yes - Mother; Hereditary Spherocytosis: No; Hypertension: No; Kidney Disease: Yes - Mother; Lung Disease: No; Seizures: No; Stroke: No; Thyroid Problems: No; Tuberculosis: No; Former smoker; Marital Status - Married; Alcohol Use: Never; Drug Use: No History; Caffeine Use: Rarely; Financial Concerns: No; Food, Clothing or Shelter Needs: No; Support System Lacking: No; Transportation Concerns: No Electronic Signature(s) Signed: 06/02/2020 12:56:34 PM By: Kalman Shan DO Signed: 06/02/2020 5:30:27 PM By: Rhae Hammock RN Entered By: Rhae Hammock on 06/02/2020 09:15:54 -------------------------------------------------------------------------------- SuperBill Details Patient Name: Date of Service: MIKELL, CAMP 06/02/2020 Medical Record Number: [id number] Patient Account Number: [id number] Date of Birth/Sex: Treating RN: 06-24-1933 (87 y.o. [name] Primary Care Provider: Crissie Sickles Other Clinician: Referring Provider: Treating Provider/Extender: Ennis Forts Weeks in Treatment: 0 Diagnosis Coding ICD-10 Codes Code Description L89.301 Pressure ulcer of unspecified buttock, stage 1 I10 Essential (primary) hypertension C18.2 Malignant neoplasm of ascending colon Facility Procedures CPT4 Code: [id number] Description: 99214 - WOUND CARE VISIT-LEV 4 EST PT Modifier: Quantity: 1 Physician Procedures : CPT4 Code Description Modifier [id number] WC PHYS LEVEL 3 NEW PT ICD-10 Diagnosis Description L89.301 Pressure ulcer of unspecified buttock, stage 1 I10 Essential (primary) hypertension C18.2 Malignant neoplasm of ascending colon Quantity: 1 Electronic Signature(s) Signed: 06/02/2020 12:56:34 PM By: Kalman Shan DO Entered By: Kalman Shan on 06/02/2020 12:56:15

## 2020-06-02 NOTE — Telephone Encounter (Signed)
I called pt's son Jimmye Norman and reviewed her PET scan images and findings with him in detail.  In addition to the known bilateral breast cancer and right axillary adenopathy, she also has 2 hypermetabolic mediastinal lymph nodes, and hypermetabolic subpectoral lymph nodes, which are not surgically removable. 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  I recommend first-line endocrine therapy with tamoxifen, due to her osteoporosis (T score -2.4 with high risk hip fracture).  Potential benefit and side effects discussed with her again, she agrees to proceed.  I called into her pharmacy today.  She will start as long as she receives it.  Due to her advanced age and multiple comorbidities, I will hold on CDK4/6 inhibitor for now. I will see her back in 2-3 months for f/u.   Truitt Merle  06/02/2020

## 2020-06-02 NOTE — Progress Notes (Signed)
Julie, Mcgee ([id number]) Visit Report for 06/02/2020 Abuse/Suicide Risk Screen Details Patient Name: Date of Service: Julie Mcgee, Julie Mcgee 06/02/2020 9:00 [place] Record Number: [id number] Patient Account Number: [id number] Date of Birth/Sex: Treating RN: August 28, 1933 (87 y.o. Tonita Phoenix, Lauren Primary Care Ibraheem Voris: Crissie Sickles Other Clinician: Referring Anoop Hemmer: Treating Brendi Mccarroll/Extender: Caroline More in Treatment: 0 Abuse/Suicide Risk Screen Items Answer ABUSE RISK SCREEN: Has anyone close to you tried to hurt or harm you recentlyo No Do you feel uncomfortable with anyone in your familyo No Has anyone forced you do things that you didnt want to doo No Electronic Signature(s) Signed: 06/02/2020 5:30:27 PM By: Rhae Hammock RN Entered By: Rhae Hammock on 06/02/2020 09:16:27 -------------------------------------------------------------------------------- Activities of Daily Living Details Patient Name: Date of Service: Julie, Mcgee 06/02/2020 9:00 [place] Record Number: [id number] Patient Account Number: [id number] Date of Birth/Sex: Treating RN: December 04, 1933 (87 y.o. Tonita Phoenix, Lauren Primary Care Arlina Sabina: Crissie Sickles Other Clinician: Referring Rozanna Cormany: Treating Lasean Gorniak/Extender: Caroline More in Treatment: 0 Activities of Daily Living Items Answer Activities of Daily Living (Please select one for each item) Drive Automobile Not Able T Medications ake Completely Able Use T elephone Completely Able Care for Appearance Need Assistance Use T oilet Need Assistance Bath / Shower Need Assistance Dress Self Need Assistance Feed Self Completely Able Walk Need Assistance Get In / Out Bed Need Assistance Housework Need Assistance Prepare Meals Need Assistance Handle Money Need Assistance Shop for Self Need Assistance Electronic Signature(s) Signed: 06/02/2020 5:30:27 PM By: Rhae Hammock  RN Entered By: Rhae Hammock on 06/02/2020 09:17:22 -------------------------------------------------------------------------------- Education Screening Details Patient Name: Date of Service: Julie Mcgee 06/02/2020 9:00 [place] Record Number: [id number] Patient Account Number: [id number] Date of Birth/Sex: Treating RN: 01-Feb-1933 (87 y.o. Tonita Phoenix, Lauren Primary Care [place] Code: Crissie Sickles Other Clinician: Referring Shravan Salahuddin: Treating Damesha Lawler/Extender: Caroline More in Treatment: 0 Primary Learner Assessed: Patient Learning Preferences/Education Level/Primary Language Learning Preference: Explanation Highest Education Level: College or Above Preferred Language: English Cognitive Barrier Language Barrier: No Translator Needed: No Memory Deficit: No Emotional Barrier: No Cultural/Religious Beliefs Affecting Medical Care: No Physical Barrier Impaired Vision: Yes Glasses Impaired Hearing: No Decreased Hand dexterity: No Knowledge/Comprehension Knowledge Level: High Comprehension Level: High Ability to understand written instructions: High Ability to understand verbal instructions: High Motivation Anxiety Level: Calm Cooperation: Cooperative Education Importance: Denies Need Interest in Health Problems: Asks Questions Perception: Coherent Willingness to Engage in Self-Management High Activities: Readiness to Engage in Self-Management High Activities: Electronic Signature(s) Signed: 06/02/2020 5:30:27 PM By: Rhae Hammock RN Entered By: Rhae Hammock on 06/02/2020 09:17:55 -------------------------------------------------------------------------------- Fall Risk Assessment Details Patient Name: Date of Service: Julie Bach RO Waikele Mcgee 06/02/2020 9:00 [facility] Medical Record Number: [id number] Patient Account Number: [id number] Date of Birth/Sex: Treating RN: 10/28/1933 (87 y.o. Tonita Phoenix, Lauren Primary Care Cejay Cambre:  Crissie Sickles Other Clinician: Referring Shandrika Ambers: Treating Raymona Boss/Extender: Caroline More in Treatment: 0 Fall Risk Assessment Items Have you had 2 or more falls in the last 12 monthso 0 No Have you had any fall that resulted in injury in the last 12 monthso 0 No FALLS RISK SCREEN History of falling - immediate or within 3 months 0 No Secondary diagnosis (Do you have 2 or more medical diagnoseso) 0 No Ambulatory aid None/bed rest/wheelchair/nurse 0 No Crutches/cane/walker 0 No Furniture 0 No Intravenous therapy Access/Saline/Heparin Lock 0 No Gait/Transferring Normal/ bed rest/ wheelchair 0 No Weak (  short steps with or without shuffle, stooped but able to lift head while walking, may seek 0 No support from furniture) Impaired (short steps with shuffle, may have difficulty arising from chair, head down, impaired 0 No balance) Mental Status Oriented to own ability 0 No Electronic Signature(s) Signed: 06/02/2020 5:30:27 PM By: Rhae Hammock RN Entered By: Rhae Hammock on 06/02/2020 09:18:02 -------------------------------------------------------------------------------- Foot Assessment Details Patient Name: Date of Service: Julie Mcgee 06/02/2020 9:00 [place] Record Number: [id number] Patient Account Number: [id number] Date of Birth/Sex: Treating RN: 1933/02/28 (87 y.o. Tonita Phoenix, Lauren Primary Care Courtenay Creger: Crissie Sickles Other Clinician: Referring Hailey Stormer: Treating Aubriana Ravelo/Extender: Caroline More in Treatment: 0 Foot Assessment Items Site Locations + = Sensation present, - = Sensation absent, C = Callus, U = Ulcer R = Redness, W = Warmth, M = Maceration, PU = Pre-ulcerative lesion F = Fissure, S = Swelling, D = Dryness Assessment Right: Left: Other Deformity: No No Prior Foot Ulcer: No No Prior Amputation: No No Charcot Joint: No No Ambulatory Status: Gait: Electronic  Signature(s) Signed: 06/02/2020 5:30:27 PM By: Rhae Hammock RN Entered By: Rhae Hammock on 06/02/2020 09:18:19 -------------------------------------------------------------------------------- Nutrition Risk Screening Details Patient Name: Date of Service: Julie, Mcgee 06/02/2020 9:00 A M Medical Record Number: [id number] Patient Account Number: [id number] Date of Birth/Sex: Treating RN: 06/21/1933 (87 y.o. Tonita Phoenix, Lauren Primary Care Yurianna Tusing: Crissie Sickles Other Clinician: Referring Ronnett Pullin: Treating Manolito Jurewicz/Extender: Caroline More in Treatment: 0 Height (in): 62 Weight (lbs): 188 Body Mass Index (BMI): 34.4 Nutrition Risk Screening Items Score Screening NUTRITION RISK SCREEN: I have an illness or condition that made me change the kind and/or amount of food I eat 0 No I eat fewer than two meals per day 0 No I eat few fruits and vegetables, or milk products 0 No I have three or more drinks of beer, liquor or wine almost every day 0 No I have tooth or mouth problems that make it hard for me to eat 0 No I don't always have enough money to buy the food I need 0 No I eat alone most of the time 0 No I take three or more different prescribed or over-the-counter drugs a day 0 No Without wanting to, I have lost or gained 10 pounds in the last six months 0 No I am not always physically able to shop, cook and/or feed myself 0 No Nutrition Protocols Good Risk Protocol 0 No interventions needed Moderate Risk Protocol High Risk Proctocol Risk Level: Good Risk Score: 0 Electronic Signature(s) Signed: 06/02/2020 5:30:27 PM By: Rhae Hammock RN Entered By: Rhae Hammock on 06/02/2020 09:18:11

## 2020-06-02 NOTE — Progress Notes (Signed)
Julie Mcgee, Julie Mcgee ([id number]) Visit Report for 06/02/2020 Allergy List Details Patient Name: Date of Service: Julie Mcgee 06/02/2020 9:00 A M Medical Record Number: [id number] Patient Account Number: [id number] Date of Birth/Sex: Treating RN: March 16, 1933 (87 y.o. Tonita Phoenix, Lauren Primary Care Aravind Chrismer: Crissie Sickles Other Clinician: Referring Dennette Faulconer: Treating Geralyn Figiel/Extender: Ennis Forts Weeks in Treatment: 0 Allergies Active Allergies adhesive tape Allergy Notes Electronic Signature(s) Signed: 06/02/2020 5:30:27 PM By: Rhae Hammock RN Entered By: Rhae Hammock on 06/02/2020 09:18:50 -------------------------------------------------------------------------------- Arrival Information Details Patient Name: Date of Service: Julie Mcgee 06/02/2020 9:00 A M Medical Record Number: [id number] Patient Account Number: [id number] Date of Birth/Sex: Treating RN: 05/18/1933 (87 y.o. Tonita Phoenix, Lauren Primary Care Eknoor Novack: Crissie Sickles Other Clinician: Referring Taylon Louison: Treating Allard Lightsey/Extender: Caroline More in Treatment: 0 Visit Information Patient Arrived: Wheel Chair Arrival Time: 09:34 Accompanied By: caregiver Transfer Assistance: None Patient Identification Verified: Yes Secondary Verification Process Completed: Yes Patient Requires Transmission-Based Precautions: No Patient Has Alerts: No History Since Last Visit Added or deleted any medications: No Any new allergies or adverse reactions: No Had a fall or experienced change in activities of daily living that may affect risk of falls: No Signs or symptoms of abuse/neglect since last visito No Hospitalized since last visit: No Implantable device outside of the clinic excluding cellular tissue based products placed in the center since last visit: No Electronic Signature(s) Signed: 06/02/2020 5:30:27 PM By: Rhae Hammock RN Entered By:  Rhae Hammock on 06/02/2020 09:34:54 -------------------------------------------------------------------------------- Clinic Level of Care Assessment Details Patient Name: Date of Service: Julie Mcgee 06/02/2020 9:00 [place] Record Number: [id number] Patient Account Number: [id number] Date of Birth/Sex: Treating RN: 09-Nov-1933 (86 y.o. [name] Primary Care Kenyah Luba: Crissie Sickles Other Clinician: Referring Mylynn Dinh: Treating Lilibeth Opie/Extender: Caroline More in Treatment: 0 Clinic Level of Care Assessment Items TOOL 2 Quantity Score X- 1 0 Use when only an EandM is performed on the INITIAL visit ASSESSMENTS - Nursing Assessment / Reassessment X- 1 20 General Physical Exam (combine w/ comprehensive assessment (listed just below) when performed on new pt. evals) X- 1 25 Comprehensive Assessment (HX, ROS, Risk Assessments, Wounds Hx, etc.) ASSESSMENTS - Wound and Skin A ssessment / Reassessment X - Simple Wound Assessment / Reassessment - one wound 1 5 []  - 0 Complex Wound Assessment / Reassessment - multiple wounds []  - 0 Dermatologic / Skin Assessment (not related to wound area) ASSESSMENTS - Ostomy and/or Continence Assessment and Care []  - 0 Incontinence Assessment and Management []  - 0 Ostomy Care Assessment and Management (repouching, etc.) PROCESS - Coordination of Care X - Simple Patient / Family Education for ongoing care 1 15 []  - 0 Complex (extensive) Patient / Family Education for ongoing care X- 1 10 Staff obtains Programmer, systems, Records, T Results / Process Orders est []  - 0 Staff telephones HHA, Nursing Homes / Clarify orders / etc []  - 0 Routine Transfer to another Facility (non-emergent condition) []  - 0 Routine Hospital Admission (non-emergent condition) X- 1 15 New Admissions / Biomedical engineer / Ordering NPWT Apligraf, etc. , []  - 0 Emergency Hospital Admission (emergent condition) X- 1 10 Simple  Discharge Coordination []  - 0 Complex (extensive) Discharge Coordination PROCESS - Special Needs []  - 0 Pediatric / Minor Patient Management []  - 0 Isolation Patient Management []  - 0 Hearing / Language / Visual special needs []  - 0 Assessment of Community assistance (transportation, D/C planning, etc.) []  -  0 Additional assistance / Altered mentation []  - 0 Support Surface(s) Assessment (bed, cushion, seat, etc.) INTERVENTIONS - Wound Cleansing / Measurement X- 1 5 Wound Imaging (photographs - any number of wounds) []  - 0 Wound Tracing (instead of photographs) []  - 0 Simple Wound Measurement - one wound X- 1 5 Complex Wound Measurement - multiple wounds X- 1 5 Simple Wound Cleansing - one wound []  - 0 Complex Wound Cleansing - multiple wounds INTERVENTIONS - Wound Dressings []  - 0 Small Wound Dressing one or multiple wounds X- 1 15 Medium Wound Dressing one or multiple wounds []  - 0 Large Wound Dressing one or multiple wounds []  - 0 Application of Medications - injection INTERVENTIONS - Miscellaneous []  - 0 External ear exam []  - 0 Specimen Collection (cultures, biopsies, blood, body fluids, etc.) []  - 0 Specimen(s) / Culture(s) sent or taken to Lab for analysis []  - 0 Patient Transfer (multiple staff / Civil Service fast streamer / Similar devices) []  - 0 Simple Staple / Suture removal (25 or less) []  - 0 Complex Staple / Suture removal (26 or more) []  - 0 Hypo / Hyperglycemic Management (close monitor of Blood Glucose) []  - 0 Ankle / Brachial Index (ABI) - do not check if billed separately Has the patient been seen at the hospital within the last three years: Yes Total Score: 130 Level Of Care: New/Established - Level 4 Electronic Signature(s) Signed: 06/02/2020 5:27:53 PM By: Levan Hurst RN, BSN Entered By: Levan Hurst on 06/02/2020 12:20:46 -------------------------------------------------------------------------------- Encounter Discharge Information  Details Patient Name: Date of Service: Julie Mcgee 06/02/2020 9:00 A M Medical Record Number: [id number] Patient Account Number: [id number] Date of Birth/Sex: Treating RN: [birth year] (87 y.o. Sue Lush Primary Care Din Bookwalter: Crissie Sickles Other Clinician: Referring Wentworth Edelen: Treating Keayra Graham/Extender: Caroline More in Treatment: 0 Encounter Discharge Information Items Discharge Condition: Stable Ambulatory Status: Wheelchair Discharge Destination: Home Transportation: Private Auto Accompanied By: Daughter In Law Schedule Follow-up Appointment: Yes Clinical Summary of Care: Provided on 06/02/2020 Form Type Recipient Paper Patient Patient Electronic Signature(s) Signed: 06/02/2020 10:41:42 AM By: Lorrin Jackson Entered By: Lorrin Jackson on 06/02/2020 10:41:42 -------------------------------------------------------------------------------- Lower Extremity Assessment Details Patient Name: Date of Service: CELESTER, MORGAN 06/02/2020 9:00 A M Medical Record Number: [id number] Patient Account Number: [id number] Date of Birth/Sex: Treating RN: 1933/08/19 (87 y.o. Tonita Phoenix, Lauren Primary Care Leahann Lempke: Crissie Sickles Other Clinician: Referring Lashawn Orrego: Treating Shin Lamour/Extender: Ennis Forts Weeks in Treatment: 0 Electronic Signature(s) Signed: 06/02/2020 5:30:27 PM By: Rhae Hammock RN Entered By: Rhae Hammock on 06/02/2020 09:18:26 -------------------------------------------------------------------------------- Multi Wound Chart Details Patient Name: Date of Service: Julie Mcgee 06/02/2020 9:00 A M Medical Record Number: [id number] Patient Account Number: [id number] Date of Birth/Sex: Treating RN: 26-Aug-1933 (87 y.o. [name] Primary Care Cullin Dishman: Crissie Sickles Other Clinician: Referring Sherwood Castilla: Treating Niurka Benecke/Extender: Caroline More in Treatment:  0 Photos: [2:No Photos Left Gluteus] [Mcgee/A:Mcgee/A Mcgee/A] Wound Location: [2:Pressure Injury] [Mcgee/A:Mcgee/A] Wounding Event: [2:Pressure Ulcer] [Mcgee/A:Mcgee/A] Primary Etiology: [2:Hypertension, Peripheral Venous] [Mcgee/A:Mcgee/A] Comorbid History: [2:Disease 05/11/2020] [Mcgee/A:Mcgee/A] Date Acquired: [2:0] [Mcgee/A:Mcgee/A] Weeks of Treatment: [2:Open] [Mcgee/A:Mcgee/A] Wound Status: [2:1x1.5x0.1] [Mcgee/A:Mcgee/A] Measurements L x W x D (cm) [2:1.178] [Mcgee/A:Mcgee/A] A (cm) : rea [2:0.118] [Mcgee/A:Mcgee/A] Volume (cm) : [2:Unstageable/Unclassified] [Mcgee/A:Mcgee/A] Classification: [2:Medium] [Mcgee/A:Mcgee/A] Exudate A mount: [2:Serosanguineous] [Mcgee/A:Mcgee/A] Exudate Type: [2:red, brown] [Mcgee/A:Mcgee/A] Exudate Color: [2:Distinct, outline attached] [Mcgee/A:Mcgee/A] Wound Margin: [2:None Present (0%)] [Mcgee/A:Mcgee/A] Granulation A mount: [2:Large (67-100%)] [Mcgee/A:Mcgee/A] Necrotic A mount: [2:Eschar, Adherent Slough] [Mcgee/A:Mcgee/A] Necrotic  Tissue: [2:Fascia: No] [Mcgee/A:Mcgee/A] Exposed Structures: [2:Fat Layer (Subcutaneous Tissue): No Tendon: No Muscle: No Joint: No Bone: No None] [Mcgee/A:Mcgee/A] Treatment Notes Wound #2 (Gluteus) Wound Laterality: Left Cleanser Soap and Water Discharge Instruction: May shower and wash wound with dial antibacterial soap and water prior to dressing change. Peri-Wound Care Zinc Oxide Ointment 30g tube Discharge Instruction: Apply Zinc Oxide to periwound with each dressing change. Purchase Destin (can be generic brand) over the counter. Topical Primary Dressing Secondary Dressing ABD Pad, 5x9 Discharge Instruction: Apply over primary dressing as directed. Secured With Compression Wrap Compression Stockings Environmental education officer) Signed: 06/02/2020 12:56:34 PM By: Kalman Shan DO Signed: 06/02/2020 5:27:53 PM By: Levan Hurst RN, BSN Entered By: Kalman Shan on 06/02/2020 12:48:36 -------------------------------------------------------------------------------- [facility] Details Patient Name: Date of Service: MAKI, HEGE [name]  06/02/2020 9:00 A M Medical Record Number: [id number] Patient Account Number: [id number] Date of Birth/Sex: Treating RN: 1933-07-24 (87 y.o. [name] Primary Care Larone Kliethermes: Crissie Sickles Other Clinician: Referring Bassem Bernasconi: Treating Zakary Kimura/Extender: Caroline More in Treatment: 0 Multidisciplinary Care Plan reviewed with physician Active Inactive Abuse / Safety / Falls / Self Care Management Nursing Diagnoses: Potential for falls Potential for injury related to falls Goals: Patient will not experience any injury related to falls Date Initiated: 06/02/2020 Target Resolution Date: 06/27/2020 Goal Status: Active Patient/caregiver will verbalize/demonstrate measures taken to prevent injury and/or falls Date Initiated: 06/02/2020 Target Resolution Date: 06/27/2020 Goal Status: Active Interventions: Assess Activities of Daily Living upon admission and as needed Assess fall risk on admission and as needed Assess: immobility, friction, shearing, incontinence upon admission and as needed Assess impairment of mobility on admission and as needed per policy Assess personal safety and home safety (as indicated) on admission and as needed Assess self care needs on admission and as needed Provide education on fall prevention Provide education on personal and home safety Notes: Pressure Nursing Diagnoses: Knowledge deficit related to causes and risk factors for pressure ulcer development Knowledge deficit related to management of pressures ulcers Potential for impaired tissue integrity related to pressure, friction, moisture, and shear Goals: Patient/caregiver will verbalize risk factors for pressure ulcer development Date Initiated: 06/02/2020 Target Resolution Date: 06/27/2020 Goal Status: Active Patient/caregiver will verbalize understanding of pressure ulcer management Date Initiated: 06/02/2020 Target Resolution Date: 06/27/2020 Goal Status:  Active Interventions: Assess: immobility, friction, shearing, incontinence upon admission and as needed Assess offloading mechanisms upon admission and as needed Assess potential for pressure ulcer upon admission and as needed Provide education on pressure ulcers Notes: Wound/Skin Impairment Nursing Diagnoses: Impaired tissue integrity Knowledge deficit related to ulceration/compromised skin integrity Goals: Patient/caregiver will verbalize understanding of skin care regimen Date Initiated: 06/02/2020 Target Resolution Date: 06/27/2020 Goal Status: Active Ulcer/skin breakdown will have a volume reduction of 30% by week 4 Date Initiated: 06/02/2020 Target Resolution Date: 06/27/2020 Goal Status: Active Interventions: Assess patient/caregiver ability to obtain necessary supplies Assess patient/caregiver ability to perform ulcer/skin care regimen upon admission and as needed Assess ulceration(s) every visit Provide education on ulcer and skin care Notes: Electronic Signature(s) Signed: 06/02/2020 5:27:53 PM By: Levan Hurst RN, BSN Entered By: Levan Hurst on 06/02/2020 10:19:13 -------------------------------------------------------------------------------- Pain Assessment Details Patient Name: Date of Service: Julie Mcgee 06/02/2020 9:00 A M Medical Record Number: [id number] Patient Account Number: [id number] Date of Birth/Sex: Treating RN: [birth year] (87 y.o. Tonita Phoenix, Lauren Primary Care Jibreel Fedewa: Crissie Sickles Other Clinician: Referring Gabriell Casimir: Treating Rance Smithson/Extender: Ennis Forts Weeks in Treatment: 0 Active Problems  Location of Pain Severity and Description of Pain Patient Has Paino No Site Locations Pain Management and Medication Current Pain Management: Electronic Signature(s) Signed: 06/02/2020 5:30:27 PM By: Rhae Hammock RN Entered By: Rhae Hammock on 06/02/2020  09:18:38 -------------------------------------------------------------------------------- Patient/Caregiver Education Details Patient Name: Date of Service: Randie Heinz 5/23/2022andnbsp9:00 A M Medical Record Number: [id number] Patient Account Number: [id number] Date of Birth/Gender: Treating RN: 09-20-1933 (87 y.o. [name] Primary Care Physician: Crissie Sickles Other Clinician: Referring Physician: Treating Physician/Extender: Caroline More in Treatment: 0 Education Assessment Education Provided To: Patient Education Topics Provided Pressure: Methods: Explain/Verbal Responses: State content correctly Safety: Methods: Explain/Verbal Responses: State content correctly Wound/Skin Impairment: Methods: Explain/Verbal Responses: State content correctly Electronic Signature(s) Signed: 06/02/2020 5:27:53 PM By: Levan Hurst RN, BSN Entered By: Levan Hurst on 06/02/2020 10:19:34 -------------------------------------------------------------------------------- Wound Assessment Details Patient Name: Date of Service: Julie Mcgee 06/02/2020 9:00 A M Medical Record Number: [id number] Patient Account Number: [id number] Date of Birth/Sex: Treating RN: 05/30/1933 (87 y.o. Tonita Phoenix, Lauren Primary Care Dominiq Fontaine: Crissie Sickles Other Clinician: Referring Hameed Kolar: Treating Josede Cicero/Extender: Ennis Forts Weeks in Treatment: 0 Wound Status Wound Number: 2 Primary Etiology: Pressure Ulcer Wound Location: Left Gluteus Wound Status: Open Wounding Event: Pressure Injury Comorbid History: Hypertension, Peripheral Venous Disease Date Acquired: 05/11/2020 Weeks Of Treatment: 0 Clustered Wound: No Photos Wound Measurements Length: (cm) 1 Width: (cm) 1.5 Depth: (cm) 0.1 Area: (cm) 1.178 Volume: (cm) 0.118 % Reduction in Area: 0% % Reduction in Volume: 0% Epithelialization: None Tunneling:  No Undermining: No Wound Description Classification: Unstageable/Unclassified Wound Margin: Distinct, outline attached Exudate Amount: Medium Exudate Type: Serosanguineous Exudate Color: red, brown Foul Odor After Cleansing: No Slough/Fibrino Yes Wound Bed Granulation Amount: None Present (0%) Exposed Structure Necrotic Amount: Large (67-100%) Fascia Exposed: No Necrotic Quality: Eschar, Adherent Slough Fat Layer (Subcutaneous Tissue) Exposed: No Tendon Exposed: No Muscle Exposed: No Joint Exposed: No Bone Exposed: No Treatment Notes Wound #2 (Gluteus) Wound Laterality: Left Cleanser Soap and Water Discharge Instruction: May shower and wash wound with dial antibacterial soap and water prior to dressing change. Peri-Wound Care Zinc Oxide Ointment 30g tube Discharge Instruction: Apply Zinc Oxide to periwound with each dressing change. Purchase Destin (can be generic brand) over the counter. Topical Primary Dressing Secondary Dressing ABD Pad, 5x9 Discharge Instruction: Apply over primary dressing as directed. Secured With Compression Wrap Compression Stockings Environmental education officer) Signed: 06/02/2020 5:06:59 PM By: Sandre Kitty Signed: 06/02/2020 5:30:27 PM By: Rhae Hammock RN Entered By: Sandre Kitty on 06/02/2020 16:45:35 -------------------------------------------------------------------------------- Vitals Details Patient Name: Date of Service: Julie Mcgee 06/02/2020 9:00 A M Medical Record Number: [id number] Patient Account Number: [id number] Date of Birth/Sex: Treating RN: Jan 19, 1933 (87 y.o. Tonita Phoenix, Lauren Primary Care Donata Reddick: Crissie Sickles Other Clinician: Referring Tarini Carrier: Treating Nyelle Wolfson/Extender: Caroline More in Treatment: 0 Vital Signs Time Taken: 09:35 Reference Range: 80 - 120 mg / dl Height (in): 64 Source: Stated Weight (lbs): 200 Source: Stated Body Mass Index (BMI):  34.3 Electronic Signature(s) Signed: 06/02/2020 5:30:27 PM By: Rhae Hammock RN Entered By: Rhae Hammock on 06/02/2020 09:35:13

## 2020-06-03 ENCOUNTER — Encounter: Payer: Self-pay | Admitting: *Deleted

## 2020-06-03 ENCOUNTER — Telehealth: Payer: Self-pay | Admitting: Hematology

## 2020-06-03 NOTE — Telephone Encounter (Signed)
Scheduled appt per 5/24 sch msg. Pt's son is aware. Requested a virtual visit.

## 2020-06-10 ENCOUNTER — Encounter (HOSPITAL_BASED_OUTPATIENT_CLINIC_OR_DEPARTMENT_OTHER): Payer: Medicare Other | Admitting: Internal Medicine

## 2020-06-11 ENCOUNTER — Encounter: Payer: Self-pay | Admitting: General Practice

## 2020-06-11 NOTE — Progress Notes (Signed)
[facility] Spiritual Care Note  Followed up from [facility] by phone. Ms Barletta was delighted for the call. Her big move got postponed until tomorrow, so she plans to follow up with me by phone once she gets settled. Per Ms Archuleta, no other needs or concerns at this time.   [place], [place], [facility] Pager [phone] Voicemail [phone]

## 2020-06-17 ENCOUNTER — Ambulatory Visit: Payer: Medicare Other | Admitting: Podiatry

## 2020-06-24 ENCOUNTER — Ambulatory Visit: Payer: Medicare Other | Admitting: Podiatry

## 2020-06-27 ENCOUNTER — Ambulatory Visit (INDEPENDENT_AMBULATORY_CARE_PROVIDER_SITE_OTHER): Payer: Medicare Other

## 2020-06-27 DIAGNOSIS — I442 Atrioventricular block, complete: Secondary | ICD-10-CM

## 2020-06-27 LAB — CUP PACEART REMOTE DEVICE CHECK
Battery Remaining Longevity: 116 mo
Battery Voltage: 3.01 V
Brady Statistic AP VP Percent: 40.98 %
Brady Statistic AP VS Percent: 10.29 %
Brady Statistic AS VP Percent: 41.38 %
Brady Statistic AS VS Percent: 7.37 %
Brady Statistic RA Percent Paced: 51.01 %
Brady Statistic RV Percent Paced: 82.4 %
Date Time Interrogation Session: 20220617035434
Implantable Lead Implant Date: 20200220
Implantable Lead Implant Date: 20200220
Implantable Lead Location: 753859
Implantable Lead Location: 753860
Implantable Lead Model: 3830
Implantable Lead Model: 5076
Implantable Pulse Generator Implant Date: 20200220
Lead Channel Impedance Value: 285 Ohm
Lead Channel Impedance Value: 304 Ohm
Lead Channel Impedance Value: 380 Ohm
Lead Channel Impedance Value: 456 Ohm
Lead Channel Pacing Threshold Amplitude: 0.625 V
Lead Channel Pacing Threshold Amplitude: 1 V
Lead Channel Pacing Threshold Pulse Width: 0.4 ms
Lead Channel Pacing Threshold Pulse Width: 0.4 ms
Lead Channel Sensing Intrinsic Amplitude: 27.75 mV
Lead Channel Sensing Intrinsic Amplitude: 27.75 mV
Lead Channel Sensing Intrinsic Amplitude: 3.375 mV
Lead Channel Sensing Intrinsic Amplitude: 3.375 mV
Lead Channel Setting Pacing Amplitude: 1.5 V
Lead Channel Setting Pacing Amplitude: 2.5 V
Lead Channel Setting Pacing Pulse Width: 0.4 ms
Lead Channel Setting Sensing Sensitivity: 2.8 mV

## 2020-06-30 ENCOUNTER — Telehealth: Payer: Self-pay | Admitting: *Deleted

## 2020-06-30 NOTE — Telephone Encounter (Signed)
Patient assistance forms were dropped off at the front desk, need to be signed by Dr Radford Pax. Placed in her box to be signed.

## 2020-07-09 NOTE — Telephone Encounter (Signed)
Form has been signed and faxed. Confirmation received.

## 2020-07-11 ENCOUNTER — Telehealth: Payer: Self-pay

## 2020-07-11 NOTE — Telephone Encounter (Signed)
**Note De-Identified Julie Mcgee Obfuscation** A completed [employer]MSPA[employer] application was faxed to the office from [facility] with a letter requesting that we make corrections and to fax back to them. The application is unreadable as the ink is very light and blurry.  I do not have the pts original copy as I did not assist with this application.  I am forwarding this message to Dr Theodosia Blender nurse for advisement as there is a [facility] message in the pts chart from 6/21 concerning the pts BMSPAF application.

## 2020-07-16 NOTE — Progress Notes (Signed)
Remote pacemaker transmission.   

## 2020-07-17 NOTE — Telephone Encounter (Signed)
**Note De-Identified Deshanta Lady Obfuscation** Dr Landis Gandy nurse emailed the pts original  BMSPAF application for Eliquis to me.  I have made corrections per [employer] and emailed it back to Dr Landis Gandy nurse so she can obtain Dr Theodosia Blender signature, date it, and to fax all to [employer] at the fax number written on the cover letter included or to place in the "to be faxed box" in Medical Records to be faxed.

## 2020-07-18 NOTE — Telephone Encounter (Signed)
Form has been signed and refaxed. Confirmation received.

## 2020-07-21 NOTE — Telephone Encounter (Signed)
**Note De-Identified Julie Mcgee Obfuscation** I called [employer] and s/w Michella and she states that the pt was approved for asst with Eliquis on 7/8 and is approved until 01/10/2021.  I asked her to re-check the pts application to be sure nothing was missing and she did and then verified again that the application was sent in complete and has been approved.  [employer]MS[employer] Case #: [id number]

## 2020-07-21 NOTE — Telephone Encounter (Signed)
Julie Mcgee with [facility] is following up. She states they received the request for patient assistance for Eliquis. She states thepatient's name and DOB is missing on the application. She also states they need to know how many refills the patient will need. If approved, after the initial supply of medication, the default is no refills  Per Julie Mcgee, they also need to verify whether or not the patient files federal taxes. Please advise.  Phone #: [phone] Fax #: [phone]

## 2020-08-04 ENCOUNTER — Inpatient Hospital Stay: Payer: Medicare Other | Attending: Hematology | Admitting: Hematology

## 2020-08-04 ENCOUNTER — Encounter: Payer: Self-pay | Admitting: Hematology

## 2020-08-04 DIAGNOSIS — C50212 Malignant neoplasm of upper-inner quadrant of left female breast: Secondary | ICD-10-CM

## 2020-08-04 DIAGNOSIS — Z170 Estrogen receptor positive status [ER+]: Secondary | ICD-10-CM

## 2020-08-04 DIAGNOSIS — C50811 Malignant neoplasm of overlapping sites of right female breast: Secondary | ICD-10-CM | POA: Diagnosis not present

## 2020-08-04 NOTE — Progress Notes (Signed)
[place]   Telephone:([phone]) [phone] Fax:(336) [phone]   Clinic Follow up Note   Patient Care Team: Lyman Bishop, DO as PCP - General (Family Medicine) Revankar, Reita Cliche, MD as PCP - Cardiology (Cardiology) Sherren Mocha, MD as PCP - Structural Heart (Cardiology) Evans Lance, MD as PCP - Electrophysiology (Cardiology) Rosalin Hawking, MD as Consulting Physician (Neurology) Mauro Kaufmann, RN as Oncology Nurse Navigator Rockwell Germany, RN as Oncology Nurse Navigator Coralie Keens, MD as Consulting Physician (General Surgery) Truitt Merle, MD as Consulting Physician (Hematology) Kyung Rudd, MD as Consulting Physician (Radiation Oncology)  Date of Service:  08/04/2020  I connected with [name] on 08/04/2020 at 11:40 AM EDT by telephone visit and verified that I am speaking with the correct person using two identifiers.  I discussed the limitations, risks, security and privacy concerns of performing an evaluation and management service by telephone and the availability of in person appointments. I also discussed with the patient that there may be a patient responsible charge related to this service. The patient expressed understanding and agreed to proceed.   Other persons participating in the visit and their role in the encounter:  patient's son [name]  Patient's location:  home Provider's location:  my office  CHIEF COMPLAINT: f/u of bilateral breast cancer  SUMMARY OF ONCOLOGIC HISTORY: Oncology History Overview Note  Cancer Staging Malignant neoplasm of ascending colon [facility]) Staging form: Colon and Rectum, AJCC 8th Edition - Pathologic stage from 05/24/2018: Stage I (pT1, pN0, cM0) - Signed by Tish Men, MD on 06/07/2018 Total positive nodes: 0  Malignant neoplasm of overlapping sites of right breast [facility]) Staging form: Breast, AJCC 8th Edition - Clinical stage from 05/21/2020: Stage IIA (cT2, cN1(f), cM0, G2, ER+, PR+, HER2-) - Unsigned Stage  prefix: Initial diagnosis Method of lymph node assessment: Core biopsy Histologic grading system: 3 grade system  Malignant neoplasm of upper-inner quadrant of left breast in female, estrogen receptor positive ([place]) Staging form: Breast, AJCC 8th Edition - Clinical stage from 05/21/2020: Stage IA (cT1a, cN0, cM0, G2, ER+, PR+, HER2-) - Unsigned Stage prefix: Initial diagnosis Method of lymph node assessment: Clinical Histologic grading system: 3 grade system    Malignant neoplasm of ascending colon [facility])   Initial Diagnosis   Malignant neoplasm of ascending colon ([place])    05/19/2018 Procedure   Colonoscopy: - Likely malignant tumor in the ascending colon. Biopsied. Tattooed. This is the source of the anemia. - A few 2 to 5 mm polyps in the rectum, in the sigmoid colon and in the ascending colon. - Non-bleeding internal hemorrhoids. - The examined portion of the ileum was normal.    05/19/2018 Pathology Results   1. Duodenum, Biopsy - BENIGN SMALL BOWEL MUCOSA. - NO VILLOUS BLUNTING OR INCREASE IN INTRAEPITHELIAL LYMPHOCYTES. - NO DYSPLASIA OR MALIGNANCY. 2. Stomach, polyp(s) - FUNDIC GLAND POLYP. - WARTHIN-STARRY IS NEGATIVE FOR HELICOBACTER PYLORI. - NO INTESTINAL METAPLASIA, DYSPLASIA, OR MALIGNANCY. 3. Colon, biopsy, ascending - INVASIVE ADENOCARCINOMA.    05/20/2018 Imaging   CT CAP: IMPRESSION: 1. No CT findings of the abdomen or pelvis to localize suspected GI bleeding. No intraluminal contrast noted.   2. Focal soft tissue thickening of the colon wall superior to the ileocecal valve may reflect reported ascending colon mass.   3. There is extensive fatty mural stratification of the cecum, suggesting chronic inflammatory sequelae of colitis, including Crohn's disease.   4. Moderate right, small left pleural effusions and associated atelectasis or consolidation. There  is mild, diffuse interlobular septal thickening, most consistent with edema.   5.  Interval  placement of aortic valve stent endograft.   6. There is a lobulated 2.6 cm mass in the central right breast (series 3, image 39), as seen on prior examination. Correlate with mammography.   7.  Other chronic and incidental findings as detailed above.    05/24/2018 Pathology Results   Accession: [id number]  Procedure: Right colon and ileum resection. Tumor Site: Proximal right colon. Tumor Size: 2.5 cm. Macroscopic Tumor Perforation: Not identified. Histologic Type: Invasive adenocarcinoma. Histologic Grade: G2, moderately differentiated. Tumor Extension: Into submucosa. Margins: Negative. Treatment Effect: N/A. Lymphovascular Invasion: N/A. Perineural Invasion: N/A. Tumor Deposits: Not identified. Regional Lymph Nodes: Number of Lymph Nodes Involved: 0 Number of Lymph Nodes Examined: 18 Pathologic Stage Classification (pTNM, AJCC 8th Edition): pT1, pN0 Ancillary Studies: MSI will be ordered. MMR was performed on the biopsy and showed loss of MLH1 and PMS2.  MSI-high, BRAF [id number] mutation present     05/24/2018 Cancer Staging   Staging form: Colon and Rectum, AJCC 8th Edition - Pathologic stage from 05/24/2018: Stage I (pT1, pN0, cM0) - Signed by Tish Men, MD on 06/07/2018    Malignant neoplasm of upper-inner quadrant of left breast in female, estrogen receptor positive ([place])  04/28/2020 Mammogram   Mammogram 04/28/20  The 3.2cm irregular mass in the right breast at 12:00 position middle depth, 5cmfn, is highly suggesitve of malignancy. An US guided biopsy is recommended.   the 0.6cm mass in the left breast upper inner aspect posterior depth, 11cmfn is indetermineate. There also are fine branching linear calcifications spanning 1.4cm in teh left breast at 6:00 position middle depth 8.5cmfn. An [place] is reocmmended.      05/14/2020 Initial Biopsy   1.Breast, Left, needle core biopsy, 10:00 position, middle depth, 8cmfn -INVASIVE DUCTAL CARCINOMA 2. breast, right, needle core  biopsy, 12:00 position, middle depth, 8cmfn -INVASIVE DUCTAL CARCINOMA 3. Lymph node, biopsy, axillary -METASTATIC CARCINOMA INVOLVING NODAL TISSUE 4. Breast, left needle core biopsy, 6:00 position, 7.3cmfn -FOCAL ATYPICAL DUCTAL HYPERPLASIA WITH CALCIFICATIONS.   1. Grade 2-3 measures 0.4cm  2. Grade 2-3 and measurers 1cm    05/16/2020 Initial Diagnosis   Malignant neoplasm of upper-inner quadrant of left breast in female, estrogen receptor positive ([place])   Malignant neoplasm of upper-outer quadrant of right breast in female, estrogen receptor positive ([place])  04/28/2020 Mammogram   Mammogram 04/28/20  The 3.2cm irregular mass in the right breast at 12:00 position middle depth, 5cmfn, is highly suggesitve of malignancy. An US guided biopsy is recommended.   the 0.6cm mass in the left breast upper inner aspect posterior depth, 11cmfn is indetermineate. There also are fine branching linear calcifications spanning 1.4cm in teh left breast at 6:00 position middle depth 8.5cmfn. An [place] is reocmmended.      05/14/2020 Initial Biopsy   1.Breast, Left, needle core biopsy, 10:00 position, middle depth, 8cmfn -INVASIVE DUCTAL CARCINOMA 2. breast, right, needle core biopsy, 12:00 position, middle depth, 8cmfn -INVASIVE DUCTAL CARCINOMA 3. Lymph node, biopsy, axillary -METASTATIC CARCINOMA INVOLVING NODAL TISSUE 4. Breast, left needle core biopsy, 6:00 position, 7.3cmfn -FOCAL ATYPICAL DUCTAL HYPERPLASIA WITH CALCIFICATIONS.   1. Grade 2-3 measures 0.4cm  2. Grade 2-3 and measurers 1cm    05/21/2020 Initial Diagnosis   Malignant neoplasm of upper-outer quadrant of right breast in female, estrogen receptor positive ([place])      CURRENT THERAPY:  Tamoxifen, started late 05/2020  INTERVAL HISTORY:  Julie Mcgee was contacted  for a virtual follow up of bilateral breast cancer. She was last seen by me on 05/21/20 in consultation.  She denies any side effects from the tamoxifen. She reports feeling  well overall with no change in her overall health.   All other systems were reviewed with the patient and are negative.  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   23 mm Edwards Sapien 3 transcatheter heart valve placed via percutaneous right transfemoral approach    Severe aortic stenosis    2D echo 02/2019 with severe AS and moderate to severe AI    SURGICAL HISTORY: Past Surgical History:  Procedure Laterality Date   BASAL CELL CARCINOMA EXCISION  2018   BIOPSY  05/19/2018   Procedure: BIOPSY;  Surgeon: Lavena Bullion, DO;  Location: [facility];  Service: Gastroenterology;;   CARDIAC CATHETERIZATION     COLONOSCOPY  2001   COLONOSCOPY WITH PROPOFOL N/A 05/19/2018   Procedure: COLONOSCOPY WITH PROPOFOL;  Surgeon: Lavena Bullion, DO;  Location: [facility];  Service: Gastroenterology;  Laterality: N/A;   ESOPHAGOGASTRODUODENOSCOPY (EGD) WITH PROPOFOL N/A 05/19/2018   Procedure: ESOPHAGOGASTRODUODENOSCOPY (EGD) WITH PROPOFOL;  Surgeon: Lavena Bullion, DO;  Location: [place];  Service: Gastroenterology;  Laterality: N/A;   HIP SURGERY  2005   KNEE SURGERY  1998   LAPAROSCOPIC PARTIAL COLECTOMY N/A 05/24/2018   Procedure: LAPAROSCOPIC ASSISTED ASCENDING HEMICOLECTOMY;  Surgeon: Ileana Roup, MD;  Location: [place];  Service: General;  Laterality: N/A;   PACEMAKER IMPLANT N/A 03/02/2018   Procedure: PACEMAKER IMPLANT;  Surgeon: Evans Lance, MD;  Location: [place] [facility];  Service: Cardiovascular;  Laterality: N/A;   RIGHT/LEFT HEART CATH AND CORONARY ANGIOGRAPHY N/A 02/21/2018   Procedure: RIGHT/LEFT HEART CATH AND CORONARY ANGIOGRAPHY;  Surgeon: Sherren Mocha, MD;  Location: [place] [facility];  Service: Cardiovascular;  Laterality: N/A;   SUBMUCOSAL TATTOO INJECTION  05/19/2018   Procedure: SUBMUCOSAL TATTOO INJECTION;  Surgeon: Lavena Bullion, DO;  Location: [place];  Service: Gastroenterology;;   TEE WITHOUT CARDIOVERSION N/A 02/28/2018   Procedure: TRANSESOPHAGEAL ECHOCARDIOGRAM (TEE);  Surgeon: Sherren Mocha, MD;  Location: [place] [facility];  Service: Open Heart Surgery;  Laterality: N/A;   TEMPORARY PACEMAKER N/A 03/01/2018   Procedure: TEMPORARY PACEMAKER;  Surgeon: Burnell Blanks, MD;  Location: [place] [facility];  Service: Cardiovascular;  Laterality: N/A;   TONSILLECTOMY     TRANSCATHETER AORTIC VALVE REPLACEMENT, TRANSFEMORAL N/A 02/28/2018   Procedure: TRANSCATHETER AORTIC VALVE REPLACEMENT, TRANSFEMORAL;  Surgeon: Sherren Mocha, MD;  Location: [place] [facility];  Service: Open Heart Surgery;  Laterality: N/A;    I have reviewed the social history and family history with the patient and they are unchanged from previous note.  ALLERGIES:  is allergic to adhesive [tape].  MEDICATIONS:  Current Outpatient Medications  Medication Sig Dispense Refill   apixaban (ELIQUIS) 5 MG TABS tablet Take 1  tablet (5 mg total) by mouth 2 (two) times daily. 180 tablet 1   Calcium Carb-Cholecalciferol (CALTRATE 600+D3 PO) Take 1 tablet by mouth 2 (two) times daily.     cholestyramine (QUESTRAN) 4 g packet Take 1 packet (4 g total) by mouth daily. Take 2 hour prior to morning meal 30 each 11   levothyroxine (SYNTHROID) 100 MCG tablet Take 100 mcg by mouth daily before breakfast.      metoprolol tartrate (LOPRESSOR) 50 MG tablet Take 50 mg by mouth 2 (two) times daily.     Multiple Vitamins-Minerals (MULTIVITAMIN ADULTS 50+) TABS Take 1  tablet by mouth daily.     pantoprazole (PROTONIX) 40 MG tablet Take 1 tablet (40 mg total) by mouth daily. 30 tablet 0   Probiotic Product (PROBIOTIC DAILY PO) Take 1 tablet by mouth daily.     simvastatin (ZOCOR) 40 MG tablet Take 40 mg by mouth daily.     tamoxifen (NOLVADEX) 20 MG tablet Take 1 tablet (20 mg total) by mouth daily. 30 tablet 3   No current facility-administered medications for this visit.    PHYSICAL EXAMINATION: ECOG PERFORMANCE STATUS: 0 - Asymptomatic  No vitals taken today, Exam not performed today  LABORATORY DATA:  I have reviewed the data as listed CBC Latest Ref Rng & Units 05/21/2020 11/02/2018 11/01/2018  WBC 4.0 - 10.5 K/uL 7.9 8.0 9.5  Hemoglobin 12.0 - 15.0 g/dL 13.0 11.5(L) 13.2  Hematocrit 36.0 - 46.0 % 39.9 37.0 42.9  Platelets 150 - 400 K/uL 221 233 282     CMP Latest Ref Rng & Units 05/21/2020 11/02/2018 11/01/2018  Glucose 70 - 99 mg/dL 70 95 87  BUN 8 - 23 mg/dL '15 16 21  ' Creatinine 0.44 - 1.00 mg/dL 0.93 0.95 1.05(H)  Sodium 135 - 145 mmol/L 137 140 140  Potassium 3.5 - 5.1 mmol/L 4.8 3.3(L) 4.2  Chloride 98 - 111 mmol/L 102 112(H) 106  CO2 22 - 32 mmol/L 27 21(L) 23  Calcium 8.9 - 10.3 mg/dL 9.1 8.5(L) 9.3  Total Protein 6.5 - 8.1 g/dL 7.2 - 6.4(L)  Total Bilirubin 0.3 - 1.2 mg/dL 0.9 - 0.8  Alkaline Phos 38 - 126 U/L 72 - 57  AST 15 - 41 U/L 24 - 26  ALT 0 - 44 U/L 17 - 13      RADIOGRAPHIC STUDIES: I have personally reviewed the radiological images as listed and agreed with the findings in the report. No results found.   ASSESSMENT & PLAN:  Cherilynn Schomburg is a 87 y.o. female with   1. Right breast cancer, Stage IIA, c(T2N1M0), ER+/PR+/HER2-, Grade II-III and Left breast cancer, Stage IA, c(T2N0M0), ER+/PR+/HER2-, Grade II-III  -We discussed her image findings and the biopsy results in great details. She has had palpable right breast mass for about 5 years, even when she lived in [place]. This was seen on CT scan at time of  colon cancer diagnosis in 2020, but patient declined work up at that time. -With new left breast nipple inversion, she proceeded with breast work up which showed larger right breast mass, extensive right axillary adnopathy, and a small left breast mass. Her 05/14/20 breast biopsy showed Invasive ductal carcinoma in right and left breast and positive right axillary LNs with grade 2-3 disease. -PET scan 05/29/20 showed: 2.9 cm mass in inferolateral right breast; numerous right axillary nodal metastases, additional high paratracheal nodal metastases; 2.4 cm mass in inferior left breast with mild hypermetabolism; no evidence of metastatic disease in  abdomen/pelvis. -Because of the mediastinal lymphadenopathy, she is not a surgical candidate. I recommended first-line endocrine therapy with tamoxifen, due to her osteoporosis (T score -2.4 with high risk hip fracture). This was prescribed on 06/02/20. -Due to her advanced age and multiple comorbidities, I will hold on CDK4/6 inhibitor for now. -She is doing well on tamoxifen, will continue. -Follow-up in 3 months with repeated right diagnostic mammogram and ultrasound   2. Recent H/o early Stage colon cancer, pT1N0M0 -She was diagnosed with colon cancer in 05/2018. -She was treated with colectomy alone. She will continue cancer surveillance.   3. Commordities: H/o CVA in 2011, CAD, s/p TAVR in 02/2018, CHF, HTN, Hypothyroidism, Afib, Peripheral vascular disease. -She has multiple comorbidities, which is managed by her other physicians. -Her 02/2019 Echo showed EF improved to 65-70%. -She is on Eliquis since Stroke. She had significant bleeding s/p breast biopsy. -She has residual right hand weakness, and her ambulation has decreased over the years. She has had frequent falls and currently ambulates with walker. -05/2020 DEXA showed T-score of -2.4 with high risk for hip fracture.    4. Social support -She lives with her elderly husband (79s) in independently  living. His mobility is lower than hers. They plan to move to assisted living soon. -She has good support from her son and his wife who are her primary care givers. They wish to be heavily involved in her care. 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      PLAN:  -continue tamoxifen -Lab and follow-up in 3 months with right diagnostic mammogram and ultrasound a week before    No problem-specific Assessment & Plan notes found for this encounter.   Orders Placed This Encounter  Procedures   MM DIAG BREAST TOMO UNI RIGHT    Standing Status:   Future    Standing Expiration Date:   08/04/2021    Scheduling Instructions:     [facility]    Order Specific Question:   Reason for Exam (SYMPTOM  OR DIAGNOSIS REQUIRED)    Answer:   f/u right breast cancer, pt is on tamoxifen, has not ahd surgery    Order Specific Question:   Preferred imaging location?    Answer:   External   US BREAST LTD UNI RIGHT INC AXILLA    Standing Status:   Future    Standing Expiration Date:   08/04/2021    Scheduling Instructions:     [facility]    Order Specific Question:   Reason for Exam (SYMPTOM  OR DIAGNOSIS REQUIRED)    Answer:   r/u right breast cancer and axillary adenopathy    Order Specific Question:   Preferred imaging location?    Answer:   External    All questions were answered. The patient knows to call the clinic with any problems, questions or concerns. No barriers to learning was detected. The total time spent in the appointment was 22 minutes.     Truitt Merle, MD 08/04/2020   I, Wilburn Mylar, am acting as scribe for Truitt Merle, MD.   I have reviewed the above documentation for accuracy and completeness, and I agree with the above.

## 2020-08-05 ENCOUNTER — Telehealth: Payer: Self-pay | Admitting: Hematology

## 2020-08-05 NOTE — Telephone Encounter (Signed)
Scheduled follow-up appointment per 7/25 los. Patient's son is aware.

## 2020-09-26 ENCOUNTER — Ambulatory Visit (INDEPENDENT_AMBULATORY_CARE_PROVIDER_SITE_OTHER): Payer: Medicare Other

## 2020-09-26 DIAGNOSIS — I442 Atrioventricular block, complete: Secondary | ICD-10-CM | POA: Diagnosis not present

## 2020-09-28 LAB — CUP PACEART REMOTE DEVICE CHECK
Battery Remaining Longevity: 113 mo
Battery Voltage: 3.01 V
Brady Statistic AP VP Percent: 66.63 %
Brady Statistic AP VS Percent: 7.56 %
Brady Statistic AS VP Percent: 22.38 %
Brady Statistic AS VS Percent: 3.45 %
Brady Statistic RA Percent Paced: 73.91 %
Brady Statistic RV Percent Paced: 89.01 %
Date Time Interrogation Session: 20220915210624
Implantable Lead Implant Date: 20200220
Implantable Lead Implant Date: 20200220
Implantable Lead Location: 753859
Implantable Lead Location: 753860
Implantable Lead Model: 3830
Implantable Lead Model: 5076
Implantable Pulse Generator Implant Date: 20200220
Lead Channel Impedance Value: 285 Ohm
Lead Channel Impedance Value: 304 Ohm
Lead Channel Impedance Value: 380 Ohm
Lead Channel Impedance Value: 475 Ohm
Lead Channel Pacing Threshold Amplitude: 0.5 V
Lead Channel Pacing Threshold Amplitude: 0.875 V
Lead Channel Pacing Threshold Pulse Width: 0.4 ms
Lead Channel Pacing Threshold Pulse Width: 0.4 ms
Lead Channel Sensing Intrinsic Amplitude: 2.75 mV
Lead Channel Sensing Intrinsic Amplitude: 2.75 mV
Lead Channel Sensing Intrinsic Amplitude: 26 mV
Lead Channel Sensing Intrinsic Amplitude: 26 mV
Lead Channel Setting Pacing Amplitude: 1.5 V
Lead Channel Setting Pacing Amplitude: 2.5 V
Lead Channel Setting Pacing Pulse Width: 0.4 ms
Lead Channel Setting Sensing Sensitivity: 2.8 mV

## 2020-10-01 NOTE — Progress Notes (Signed)
Remote pacemaker transmission.   

## 2020-10-30 ENCOUNTER — Telehealth: Payer: Self-pay | Admitting: *Deleted

## 2020-10-30 NOTE — Telephone Encounter (Signed)
Family called to say Julie Mcgee has recently gained a lot of weight. They are concerned that the cancer has spread to her liver. They are asking for labs to check her liver. She has an appt on 10/27.

## 2020-10-31 ENCOUNTER — Telehealth: Payer: Self-pay | Admitting: Cardiology

## 2020-10-31 DIAGNOSIS — I5032 Chronic diastolic (congestive) heart failure: Secondary | ICD-10-CM

## 2020-10-31 NOTE — Telephone Encounter (Signed)
Spoke with the patient's daughter-in-law who states that she has noticed that the patient has had some increased swelling in her ankles. She states that they also noticed some swelling in her abdomen. She called the patient's oncologist due to concerns about liver cancer and he advised that they contact cardiology due to history of CHF. The daughter-in-law states that the patient looks swollen and looks as if she has gained weight, however her weight is not increased. Patient has been a lot more sedentary recently. She reports that the patient does not have any shortness of breath. Patient is very fatigued though and is sleeping very often during the day. Patient does not sleep well throughout the night due to her husband waking her frequently. She states that patient's diet usually consists of yogurt and banana for breakfast, salad for lunch, and dinner at her assisted living facility. She states that the facility knows she has CHF and is supposed to have her on low sodium diet. Patient also frequently eats cheetos and chocolate. Patient was previously on Lasix but has incontinence so it was stopped.  Encouraged patient's daughter-in-law to have the patient increase her activity, elevate her legs when she is resting, and avoid sodium. Will send to Dr. Radford Pax for further advisement.

## 2020-10-31 NOTE — Telephone Encounter (Signed)
Pt c/o swelling: STAT is pt has developed SOB within 24 hours  If swelling, where is the swelling located? Swelling on her left side, ankles And stomach  How much weight have you gained and in what time span? In the past   Have you gained 3 pounds in a day or 5 pounds in a week?  no  Do you have a log of your daily weights (if so, list)? yes  Are you currently taking a fluid pill? yes  Are you currently SOB? no  Have you traveled recently? No   New Message:   Pt have Cancer and her Oncologist told her to contact her Cardiologist to see if she needs to be seen for this swelling

## 2020-11-03 ENCOUNTER — Other Ambulatory Visit: Payer: Self-pay

## 2020-11-03 NOTE — Telephone Encounter (Signed)
Spoke with patient's daughter-in-law and advised her that Dr. Radford Pax would like the patient to have lab work done. The daughter-in-law states that the patient is having lab work done at the cancer center tomorrow and the patient does not like needles so would like to have labs done all at once if possible. I have put in orders for lab work and left a message with the [facility] to see if they will be able to draw the labs while she is there.

## 2020-11-03 NOTE — Telephone Encounter (Signed)
Left message for patient's daughter in law to call back.  

## 2020-11-04 ENCOUNTER — Other Ambulatory Visit: Payer: Self-pay

## 2020-11-04 ENCOUNTER — Inpatient Hospital Stay: Payer: Medicare Other | Attending: Hematology

## 2020-11-04 DIAGNOSIS — I739 Peripheral vascular disease, unspecified: Secondary | ICD-10-CM | POA: Insufficient documentation

## 2020-11-04 DIAGNOSIS — Z79810 Long term (current) use of selective estrogen receptor modulators (SERMs): Secondary | ICD-10-CM | POA: Diagnosis not present

## 2020-11-04 DIAGNOSIS — I4891 Unspecified atrial fibrillation: Secondary | ICD-10-CM | POA: Diagnosis not present

## 2020-11-04 DIAGNOSIS — C50212 Malignant neoplasm of upper-inner quadrant of left female breast: Secondary | ICD-10-CM | POA: Diagnosis not present

## 2020-11-04 DIAGNOSIS — Z170 Estrogen receptor positive status [ER+]: Secondary | ICD-10-CM | POA: Diagnosis not present

## 2020-11-04 DIAGNOSIS — Z7901 Long term (current) use of anticoagulants: Secondary | ICD-10-CM | POA: Diagnosis not present

## 2020-11-04 DIAGNOSIS — C50811 Malignant neoplasm of overlapping sites of right female breast: Secondary | ICD-10-CM

## 2020-11-04 DIAGNOSIS — Z8673 Personal history of transient ischemic attack (TIA), and cerebral infarction without residual deficits: Secondary | ICD-10-CM | POA: Insufficient documentation

## 2020-11-04 DIAGNOSIS — I509 Heart failure, unspecified: Secondary | ICD-10-CM | POA: Insufficient documentation

## 2020-11-04 DIAGNOSIS — E039 Hypothyroidism, unspecified: Secondary | ICD-10-CM | POA: Diagnosis not present

## 2020-11-04 DIAGNOSIS — I5032 Chronic diastolic (congestive) heart failure: Secondary | ICD-10-CM

## 2020-11-04 DIAGNOSIS — Z79899 Other long term (current) drug therapy: Secondary | ICD-10-CM | POA: Insufficient documentation

## 2020-11-04 DIAGNOSIS — I110 Hypertensive heart disease with heart failure: Secondary | ICD-10-CM | POA: Insufficient documentation

## 2020-11-04 DIAGNOSIS — Z85038 Personal history of other malignant neoplasm of large intestine: Secondary | ICD-10-CM | POA: Diagnosis not present

## 2020-11-04 LAB — COMPREHENSIVE METABOLIC PANEL
ALT: 6 U/L (ref 0–44)
AST: 13 U/L — ABNORMAL LOW (ref 15–41)
Albumin: 3.2 g/dL — ABNORMAL LOW (ref 3.5–5.0)
Alkaline Phosphatase: 48 U/L (ref 38–126)
Anion gap: 10 (ref 5–15)
BUN: 18 mg/dL (ref 8–23)
CO2: 23 mmol/L (ref 22–32)
Calcium: 9 mg/dL (ref 8.9–10.3)
Chloride: 102 mmol/L (ref 98–111)
Creatinine, Ser: 0.86 mg/dL (ref 0.44–1.00)
GFR, Estimated: >60 mL/min (ref >60)
Glucose, Bld: 89 mg/dL (ref 70–99)
Potassium: 4.7 mmol/L (ref 3.5–5.1)
Sodium: 135 mmol/L (ref 135–145)
Total Bilirubin: 0.6 mg/dL (ref 0.3–1.2)
Total Protein: 7 g/dL (ref 6.5–8.1)

## 2020-11-04 LAB — BRAIN NATRIURETIC PEPTIDE: B Natriuretic Peptide: 625 pg/mL — ABNORMAL HIGH (ref 0.0–100.0)

## 2020-11-04 LAB — CBC WITH DIFFERENTIAL/PLATELET
Abs Immature Granulocytes: 0.04 10*3/uL (ref 0.00–0.07)
Basophils Absolute: 0.1 10*3/uL (ref 0.0–0.1)
Basophils Relative: 1 %
Eosinophils Absolute: 0.4 10*3/uL (ref 0.0–0.5)
Eosinophils Relative: 4 %
HCT: 33.7 % — ABNORMAL LOW (ref 36.0–46.0)
Hemoglobin: 11 g/dL — ABNORMAL LOW (ref 12.0–15.0)
Immature Granulocytes: 0 %
Lymphocytes Relative: 11 %
Lymphs Abs: 1 10*3/uL (ref 0.7–4.0)
MCH: 32.1 pg (ref 26.0–34.0)
MCHC: 32.6 g/dL (ref 30.0–36.0)
MCV: 98.3 fL (ref 80.0–100.0)
Monocytes Absolute: 0.6 10*3/uL (ref 0.1–1.0)
Monocytes Relative: 7 %
Neutro Abs: 7.2 10*3/uL (ref 1.7–7.7)
Neutrophils Relative %: 77 %
Platelets: 287 10*3/uL (ref 150–400)
RBC: 3.43 MIL/uL — ABNORMAL LOW (ref 3.87–5.11)
RDW: 14.4 % (ref 11.5–15.5)
WBC: 9.3 10*3/uL (ref 4.0–10.5)
nRBC: 0 % (ref 0.0–0.2)

## 2020-11-04 LAB — BASIC METABOLIC PANEL
Anion gap: 8 (ref 5–15)
BUN: 18 mg/dL (ref 8–23)
CO2: 25 mmol/L (ref 22–32)
Calcium: 9.1 mg/dL (ref 8.9–10.3)
Chloride: 103 mmol/L (ref 98–111)
Creatinine, Ser: 0.85 mg/dL (ref 0.44–1.00)
GFR, Estimated: >60 mL/min (ref >60)
Glucose, Bld: 89 mg/dL (ref 70–99)
Potassium: 4.7 mmol/L (ref 3.5–5.1)
Sodium: 136 mmol/L (ref 135–145)

## 2020-11-04 NOTE — Telephone Encounter (Signed)
-----   Message from Sueanne Margarita, MD sent at 11/04/2020 12:31 PM EDT ----- BNP is elevated c/w volume overload.  SCr normal.  Start Lasix 40mg  BID for 3 days and then back to 20mg  daily and followup with extender in office on Friday along with a BMET

## 2020-11-04 NOTE — Telephone Encounter (Signed)
Spoke with the patient's son who states that his mom does not want to take Lasix. They have tried it in the past and she took it for a few days and quit. He states that she is being very noncompliant right now. She is not getting up and moving like they encourage her to and is not watching her sodium intake at all. Advised patient's son of importance of having her take a diuretic to get fluid off. Advised on worsening condition without. Patient's son understands. He states that he will talk with his mom and do his best to convince her to try the lasix. He is going to call us back with what she decides.

## 2020-11-05 MED ORDER — FUROSEMIDE 20 MG PO TABS: 20.0000 mg | ORAL_TABLET | Freq: Every day | ORAL | 3 refills | Status: DC

## 2020-11-05 NOTE — Telephone Encounter (Signed)
Patient has decided to start on Lasix, see MyChart message. Will send in prescription to patient's requested pharmacy.

## 2020-11-06 ENCOUNTER — Telehealth: Payer: Self-pay

## 2020-11-06 ENCOUNTER — Inpatient Hospital Stay (HOSPITAL_BASED_OUTPATIENT_CLINIC_OR_DEPARTMENT_OTHER): Payer: Medicare Other | Admitting: Hematology

## 2020-11-06 ENCOUNTER — Other Ambulatory Visit: Payer: Self-pay

## 2020-11-06 ENCOUNTER — Inpatient Hospital Stay: Payer: Medicare Other

## 2020-11-06 ENCOUNTER — Encounter: Payer: Self-pay | Admitting: Hematology

## 2020-11-06 VITALS — BP 147/62 | HR 61 | Temp 98.0°F | Resp 18 | Ht 62.0 in | Wt 210.0 lb

## 2020-11-06 DIAGNOSIS — C182 Malignant neoplasm of ascending colon: Secondary | ICD-10-CM

## 2020-11-06 DIAGNOSIS — C50212 Malignant neoplasm of upper-inner quadrant of left female breast: Secondary | ICD-10-CM | POA: Diagnosis not present

## 2020-11-06 DIAGNOSIS — I6523 Occlusion and stenosis of bilateral carotid arteries: Secondary | ICD-10-CM | POA: Diagnosis not present

## 2020-11-06 NOTE — Telephone Encounter (Signed)
Called with [facility] to schedule pt's Mammogram w/US.  Appt scheduled for 11/20/2020 @0800  at [address] [place], [place] [address].  Faxed orders for Mammogram to [facility] [phone]).  Contacted pt to inform her of her appt time and date.  Pt's son stated that day and time will not work with his schedule.  Gave pt's son the telephone number to [facility] to rescheduled and informed him that Dr. Burr Medico requested that the mammogram be done prior to her follow-up appt with Dr. Burr Medico.  Pt's son verbalized understanding of instructions.

## 2020-11-06 NOTE — Progress Notes (Signed)
[place]   [phone] [phone]   Clinic Follow up Note   Patient Care Team: Lyman Bishop, DO as PCP - General (Family Medicine) Revankar, Reita Cliche, MD as PCP - Cardiology (Cardiology) Sherren Mocha, MD as PCP - Structural Heart (Cardiology) Evans Lance, MD as PCP - Electrophysiology (Cardiology) Rosalin Hawking, MD as Consulting Physician (Neurology) Mauro Kaufmann, RN as Oncology Nurse Navigator Rockwell Germany, RN as Oncology Nurse Navigator Coralie Keens, MD as Consulting Physician (General Surgery) Truitt Merle, MD as Consulting Physician (Hematology) Kyung Rudd, MD as Consulting Physician (Radiation Oncology)  Date of Service:  11/06/2020  CHIEF COMPLAINT: f/u of bilateral breast cancer  CURRENT THERAPY:  Tamoxifen, started late 05/2020  ASSESSMENT & PLAN:  Julie Mcgee is a 87 y.o. female with   1. Right breast cancer, Stage IIA, c(T2N1M0), ER+/PR+/HER2-, Grade II-III and Left breast cancer, Stage IA, c(T2N0M0), ER+/PR+/HER2-, Grade II-III  -She has had palpable right breast mass for about 5 years, even when she lived in [place]. This was seen on CT scan at time of colon cancer diagnosis in 2020, but patient declined work up at that time. -With new left breast nipple inversion, she proceeded with breast work up which showed larger right breast mass, extensive right axillary adnopathy, and a small left breast mass. Her 05/14/20 breast biopsy showed Invasive ductal carcinoma in right and left breast and positive right axillary LNs with grade 2-3 disease. -PET scan 05/29/20 showed: 2.9 cm mass in inferolateral right breast; numerous right axillary nodal metastases, additional high paratracheal nodal metastases; 2.4 cm mass in inferior left breast with mild hypermetabolism; no evidence of metastatic disease in abdomen/pelvis. -Because of the mediastinal lymphadenopathy, she is not a surgical candidate. She started tamoxifen, due to  her osteoporosis (T score -2.4 with high risk hip fracture), on 06/02/20. -She is doing well on tamoxifen, will continue. -she is clinically stable. On physical exam, the dominant right breast mass has shrunk to 2 x 2.5 cm, and the right lymph node to 1 x 1.5 cm. -lab and follow-up in 4 months    2. Recent H/o early Stage colon cancer, pT1N0M0 -She was diagnosed with colon cancer in 05/2018. -She was treated with colectomy alone. She will continue cancer surveillance.   3. Commordities: H/o CVA in 2011, CAD, s/p TAVR in 02/2018, CHF, HTN, Hypothyroidism, Afib, Peripheral vascular disease. -She has multiple comorbidities, which is managed by her other physicians. -Her 02/2019 Echo showed EF improved to 65-70%. -She is on Eliquis since Stroke. She had significant bleeding s/p breast biopsy. -She has residual right hand weakness, and her ambulation has decreased over the years. She has had frequent falls and currently ambulates with walker. -05/2020 DEXA showed T-score of -2.4 with high risk for hip fracture.    4. Social support -She lives with her elderly husband (73s) in independently living. His mobility is lower than hers. They plan to move to assisted living soon. -She has good support from her son and his wife who are her primary care givers. They wish to be heavily involved in her care. 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      PLAN:  -continue tamoxifen -Lab and follow-up in 3 months  -will schedule right diagnostic mammogram and ultrasound in next few weeks at [facility].   No problem-specific Assessment & Plan notes found for this encounter.   SUMMARY OF ONCOLOGIC HISTORY: Oncology History Overview Note  Cancer Staging Malignant neoplasm  of ascending colon [facility]) Staging form: Colon and Rectum, AJCC 8th Edition - Pathologic stage from 05/24/2018: Stage I (pT1, pN0, cM0) - Signed by Tish Men, MD on 06/07/2018 Total positive nodes: 0  Malignant neoplasm of  overlapping sites of right breast [facility]) Staging form: Breast, AJCC 8th Edition - Clinical stage from 05/21/2020: Stage IIA (cT2, cN1(f), cM0, G2, ER+, PR+, HER2-) - Unsigned Stage prefix: Initial diagnosis Method of lymph node assessment: Core biopsy Histologic grading system: 3 grade system  Malignant neoplasm of upper-inner quadrant of left breast in female, estrogen receptor positive ([place]) Staging form: Breast, AJCC 8th Edition - Clinical stage from 05/21/2020: Stage IA (cT1a, cN0, cM0, G2, ER+, PR+, HER2-) - Unsigned Stage prefix: Initial diagnosis Method of lymph node assessment: Clinical Histologic grading system: 3 grade system    Malignant neoplasm of ascending colon [facility])   Initial Diagnosis   Malignant neoplasm of ascending colon ([place])   05/19/2018 Procedure   Colonoscopy: - Likely malignant tumor in the ascending colon. Biopsied. Tattooed. This is the source of the anemia. - A few 2 to 5 mm polyps in the rectum, in the sigmoid colon and in the ascending colon. - Non-bleeding internal hemorrhoids. - The examined portion of the ileum was normal.   05/19/2018 Pathology Results   1. Duodenum, Biopsy - BENIGN SMALL BOWEL MUCOSA. - NO VILLOUS BLUNTING OR INCREASE IN INTRAEPITHELIAL LYMPHOCYTES. - NO DYSPLASIA OR MALIGNANCY. 2. Stomach, polyp(s) - FUNDIC GLAND POLYP. - WARTHIN-STARRY IS NEGATIVE FOR HELICOBACTER PYLORI. - NO INTESTINAL METAPLASIA, DYSPLASIA, OR MALIGNANCY. 3. Colon, biopsy, ascending - INVASIVE ADENOCARCINOMA.   05/20/2018 Imaging   CT CAP: IMPRESSION: 1. No CT findings of the abdomen or pelvis to localize suspected GI bleeding. No intraluminal contrast noted.   2. Focal soft tissue thickening of the colon wall superior to the ileocecal valve may reflect reported ascending colon mass.   3. There is extensive fatty mural stratification of the cecum, suggesting chronic inflammatory sequelae of colitis, including Crohn's disease.   4. Moderate right,  small left pleural effusions and associated atelectasis or consolidation. There is mild, diffuse interlobular septal thickening, most consistent with edema.   5.  Interval placement of aortic valve stent endograft.   6. There is a lobulated 2.6 cm mass in the central right breast (series 3, image 39), as seen on prior examination. Correlate with mammography.   7.  Other chronic and incidental findings as detailed above.   05/24/2018 Pathology Results   Accession: [id number]  Procedure: Right colon and ileum resection. Tumor Site: Proximal right colon. Tumor Size: 2.5 cm. Macroscopic Tumor Perforation: Not identified. Histologic Type: Invasive adenocarcinoma. Histologic Grade: G2, moderately differentiated. Tumor Extension: Into submucosa. Margins: Negative. Treatment Effect: N/A. Lymphovascular Invasion: N/A. Perineural Invasion: N/A. Tumor Deposits: Not identified. Regional Lymph Nodes: Number of Lymph Nodes Involved: 0 Number of Lymph Nodes Examined: 18 Pathologic Stage Classification (pTNM, AJCC 8th Edition): pT1, pN0 Ancillary Studies: MSI will be ordered. MMR was performed on the biopsy and showed loss of MLH1 and PMS2.  MSI-high, BRAF [id number] mutation present    05/24/2018 Cancer Staging   Staging form: Colon and Rectum, AJCC 8th Edition - Pathologic stage from 05/24/2018: Stage I (pT1, pN0, cM0) - Signed by Tish Men, MD on 06/07/2018    Malignant neoplasm of upper-inner quadrant of left breast in female, estrogen receptor positive ([place])  04/28/2020 Mammogram   Mammogram 04/28/20  The 3.2cm irregular mass in the right breast at 12:00 position middle depth, 5cmfn, is highly  suggesitve of malignancy. An US guided biopsy is recommended.   the 0.6cm mass in the left breast upper inner aspect posterior depth, 11cmfn is indetermineate. There also are fine branching linear calcifications spanning 1.4cm in teh left breast at 6:00 position middle depth 8.5cmfn. An [place] is  reocmmended.      05/14/2020 Initial Biopsy   1.Breast, Left, needle core biopsy, 10:00 position, middle depth, 8cmfn -INVASIVE DUCTAL CARCINOMA 2. breast, right, needle core biopsy, 12:00 position, middle depth, 8cmfn -INVASIVE DUCTAL CARCINOMA 3. Lymph node, biopsy, axillary -METASTATIC CARCINOMA INVOLVING NODAL TISSUE 4. Breast, left needle core biopsy, 6:00 position, 7.3cmfn -FOCAL ATYPICAL DUCTAL HYPERPLASIA WITH CALCIFICATIONS.   1. Grade 2-3 measures 0.4cm  2. Grade 2-3 and measurers 1cm    05/16/2020 Initial Diagnosis   Malignant neoplasm of upper-inner quadrant of left breast in female, estrogen receptor positive ([place])   Malignant neoplasm of upper-outer quadrant of right breast in female, estrogen receptor positive ([place])  04/28/2020 Mammogram   Mammogram 04/28/20  The 3.2cm irregular mass in the right breast at 12:00 position middle depth, 5cmfn, is highly suggesitve of malignancy. An US guided biopsy is recommended.   the 0.6cm mass in the left breast upper inner aspect posterior depth, 11cmfn is indetermineate. There also are fine branching linear calcifications spanning 1.4cm in teh left breast at 6:00 position middle depth 8.5cmfn. An [place] is reocmmended.      05/14/2020 Initial Biopsy   1.Breast, Left, needle core biopsy, 10:00 position, middle depth, 8cmfn -INVASIVE DUCTAL CARCINOMA 2. breast, right, needle core biopsy, 12:00 position, middle depth, 8cmfn -INVASIVE DUCTAL CARCINOMA 3. Lymph node, biopsy, axillary -METASTATIC CARCINOMA INVOLVING NODAL TISSUE 4. Breast, left needle core biopsy, 6:00 position, 7.3cmfn -FOCAL ATYPICAL DUCTAL HYPERPLASIA WITH CALCIFICATIONS.   1. Grade 2-3 measures 0.4cm  2. Grade 2-3 and measurers 1cm    05/21/2020 Initial Diagnosis   Malignant neoplasm of upper-outer quadrant of right breast in female, estrogen receptor positive ([place])      INTERVAL HISTORY:  Julie Mcgee is here for a follow up of breast cancer. She was last seen  by me on 05/21/20, with phone visit in the interim. She presents to the clinic accompanied by her son. She reports she has no side effects from the tamoxifen. She notes she is urinating frequently due to Eliquis. Her son reports she has also been short of breath since starting Lasix yesterday (11/05/20). She reports she has blood in her stool very rarely.   All other systems were reviewed with the patient and are negative.  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   23 mm Edwards Sapien 3 transcatheter heart valve placed via percutaneous right transfemoral approach    Severe aortic stenosis    2D echo 02/2019 with severe AS and moderate to severe AI    SURGICAL HISTORY: Past Surgical History:  Procedure Laterality Date   BASAL CELL CARCINOMA EXCISION  2018   BIOPSY  05/19/2018   Procedure: BIOPSY;  Surgeon: Lavena Bullion, DO;  Location: [place] [facility];  Service: Gastroenterology;;   CARDIAC CATHETERIZATION     COLONOSCOPY  2001   COLONOSCOPY WITH PROPOFOL N/A 05/19/2018   Procedure: COLONOSCOPY WITH PROPOFOL;  Surgeon: Lavena Bullion, DO;  Location: [place];  Service: Gastroenterology;  Laterality: N/A;   ESOPHAGOGASTRODUODENOSCOPY (EGD) WITH PROPOFOL N/A 05/19/2018   Procedure: ESOPHAGOGASTRODUODENOSCOPY (EGD) WITH PROPOFOL;  Surgeon: Lavena Bullion, DO;  Location: [place];  Service: Gastroenterology;  Laterality: N/A;   HIP SURGERY  2005   KNEE SURGERY  1998   LAPAROSCOPIC  PARTIAL COLECTOMY N/A 05/24/2018   Procedure: LAPAROSCOPIC ASSISTED ASCENDING HEMICOLECTOMY;  Surgeon: Ileana Roup, MD;  Location: [place];  Service: General;  Laterality: N/A;   PACEMAKER IMPLANT N/A 03/02/2018   Procedure: PACEMAKER IMPLANT;  Surgeon: Evans Lance, MD;  Location: [place] CV LAB;  Service: Cardiovascular;  Laterality: N/A;   RIGHT/LEFT HEART CATH AND CORONARY ANGIOGRAPHY N/A 02/21/2018   Procedure: RIGHT/LEFT HEART CATH AND CORONARY ANGIOGRAPHY;  Surgeon: Sherren Mocha, MD;  Location: [place] CV LAB;  Service: Cardiovascular;  Laterality: N/A;   SUBMUCOSAL TATTOO INJECTION  05/19/2018   Procedure: SUBMUCOSAL TATTOO INJECTION;  Surgeon: Lavena Bullion, DO;  Location: [place];  Service: Gastroenterology;;   TEE WITHOUT CARDIOVERSION N/A 02/28/2018   Procedure: TRANSESOPHAGEAL ECHOCARDIOGRAM (TEE);  Surgeon: Sherren Mocha, MD;  Location: [place] CV LAB;  Service: Open Heart Surgery;  Laterality: N/A;   TEMPORARY PACEMAKER N/A 03/01/2018   Procedure: TEMPORARY PACEMAKER;  Surgeon: Burnell Blanks, MD;  Location: [place] CV LAB;  Service: Cardiovascular;  Laterality: N/A;   TONSILLECTOMY     TRANSCATHETER AORTIC VALVE REPLACEMENT, TRANSFEMORAL N/A 02/28/2018   Procedure: TRANSCATHETER AORTIC VALVE REPLACEMENT, TRANSFEMORAL;  Surgeon: Sherren Mocha, MD;  Location: [place] CV LAB;  Service: Open Heart Surgery;  Laterality: N/A;    I have reviewed the social history and family history with the patient and they are unchanged from previous note.  ALLERGIES:  is allergic to adhesive [tape].  MEDICATIONS:  Current Outpatient Medications  Medication Sig Dispense Refill   apixaban (ELIQUIS) 5 MG TABS tablet Take 1 tablet (5 mg total) by mouth 2 (two) times daily. 180 tablet 1   Calcium Carb-Cholecalciferol (CALTRATE 600+D3 PO) Take 1 tablet by mouth 2 (two) times daily.     cholestyramine (QUESTRAN) 4 g packet Take 1 packet (4 g total) by  mouth daily. Take 2 hour prior to morning meal 30 each 11   furosemide (LASIX) 20 MG tablet Take 1 tablet (20 mg total) by mouth daily. Take 2 tablets (40 mg) twice daily for three days, then reduce to 1 tablet (20 mg total) once daily. 90 tablet 3   levothyroxine (SYNTHROID) 100 MCG tablet Take 100 mcg by mouth daily before breakfast.      metoprolol tartrate (LOPRESSOR) 50 MG tablet Take 50 mg by mouth 2 (two) times daily.     Multiple Vitamins-Minerals (MULTIVITAMIN ADULTS 50+) TABS Take 1 tablet by mouth daily.     pantoprazole (PROTONIX) 40 MG tablet Take 1 tablet (40 mg total) by mouth daily. 30 tablet 0   Probiotic Product (PROBIOTIC DAILY PO) Take 1 tablet by mouth daily.     simvastatin (ZOCOR) 40 MG tablet Take 40 mg by mouth daily.     tamoxifen (NOLVADEX) 20 MG tablet Take 1 tablet (20 mg total) by mouth daily. 30 tablet 3  No current facility-administered medications for this visit.    PHYSICAL EXAMINATION: ECOG PERFORMANCE STATUS: 2 - Symptomatic, <50% confined to bed  Vitals:   11/06/20 1445  BP: (!) 147/62  Pulse: 61  Resp: 18  Temp: 98 F (36.7 C)   Wt Readings from Last 3 Encounters:  11/06/20 95.3 kg  05/21/20 94.5 kg  02/22/20 83.9 kg     GENERAL:alert, no distress and comfortable SKIN: skin color, texture, turgor are normal, no rashes or significant lesions EYES: normal, Conjunctiva are pink and non-injected, sclera clear  LYMPH:  no palpable lymphadenopathy in the cervical. 1 x 1.5 cm lymph node in right axilla. Musculoskeletal:no cyanosis of digits and no clubbing  NEURO: alert & oriented x 3 with fluent speech, no focal motor/sensory deficits BREAST: 2.5 cm mass around the 11 o'clock position of right breast.   LABORATORY DATA:  I have reviewed the data as listed CBC Latest Ref Rng & Units 11/04/2020 05/21/2020 11/02/2018  WBC 4.0 - 10.5 K/uL 9.3 7.9 8.0  Hemoglobin 12.0 - 15.0 g/dL 11.0(L) 13.0 11.5(L)  Hematocrit 36.0 - 46.0 % 33.7(L) 39.9 37.0   Platelets 150 - 400 K/uL 287 221 233     CMP Latest Ref Rng & Units 11/04/2020 11/04/2020 05/21/2020  Glucose 70 - 99 mg/dL 89 89 70  BUN 8 - 23 mg/dL '18 18 15  ' Creatinine 0.44 - 1.00 mg/dL 0.86 0.85 0.93  Sodium 135 - 145 mmol/L 135 136 137  Potassium 3.5 - 5.1 mmol/L 4.7 4.7 4.8  Chloride 98 - 111 mmol/L 102 103 102  CO2 22 - 32 mmol/L '23 25 27  ' Calcium 8.9 - 10.3 mg/dL 9.0 9.1 9.1  Total Protein 6.5 - 8.1 g/dL 7.0 - 7.2  Total Bilirubin 0.3 - 1.2 mg/dL 0.6 - 0.9  Alkaline Phos 38 - 126 U/L 48 - 72  AST 15 - 41 U/L 13(L) - 24  ALT 0 - 44 U/L 6 - 17      RADIOGRAPHIC STUDIES: I have personally reviewed the radiological images as listed and agreed with the findings in the report. No results found.    No orders of the defined types were placed in this encounter.  All questions were answered. The patient knows to call the clinic with any problems, questions or concerns. No barriers to learning was detected. The total time spent in the appointment was 30 minutes.     Truitt Merle, MD 11/06/2020   I, Wilburn Mylar, am acting as scribe for Truitt Merle, MD.   I have reviewed the above documentation for accuracy and completeness, and I agree with the above.

## 2020-12-24 ENCOUNTER — Encounter: Payer: Self-pay | Admitting: Hematology

## 2020-12-26 ENCOUNTER — Ambulatory Visit (INDEPENDENT_AMBULATORY_CARE_PROVIDER_SITE_OTHER): Payer: Medicare Other

## 2020-12-26 DIAGNOSIS — I469 Cardiac arrest, cause unspecified: Secondary | ICD-10-CM | POA: Diagnosis not present

## 2020-12-26 LAB — CUP PACEART REMOTE DEVICE CHECK
Battery Remaining Longevity: 109 mo
Battery Voltage: 3.01 V
Brady Statistic AP VP Percent: 60.64 %
Brady Statistic AP VS Percent: 3.24 %
Brady Statistic AS VP Percent: 35.2 %
Brady Statistic AS VS Percent: 0.93 %
Brady Statistic RA Percent Paced: 63.82 %
Brady Statistic RV Percent Paced: 95.83 %
Date Time Interrogation Session: 20221216011108
Implantable Lead Implant Date: 20200220
Implantable Lead Implant Date: 20200220
Implantable Lead Location: 753859
Implantable Lead Location: 753860
Implantable Lead Model: 3830
Implantable Lead Model: 5076
Implantable Pulse Generator Implant Date: 20200220
Lead Channel Impedance Value: 304 Ohm
Lead Channel Impedance Value: 304 Ohm
Lead Channel Impedance Value: 380 Ohm
Lead Channel Impedance Value: 456 Ohm
Lead Channel Pacing Threshold Amplitude: 0.625 V
Lead Channel Pacing Threshold Amplitude: 1 V
Lead Channel Pacing Threshold Pulse Width: 0.4 ms
Lead Channel Pacing Threshold Pulse Width: 0.4 ms
Lead Channel Sensing Intrinsic Amplitude: 3 mV
Lead Channel Sensing Intrinsic Amplitude: 3 mV
Lead Channel Sensing Intrinsic Amplitude: 31.375 mV
Lead Channel Sensing Intrinsic Amplitude: 31.375 mV
Lead Channel Setting Pacing Amplitude: 1.5 V
Lead Channel Setting Pacing Amplitude: 2.5 V
Lead Channel Setting Pacing Pulse Width: 0.4 ms
Lead Channel Setting Sensing Sensitivity: 2.8 mV

## 2021-01-07 NOTE — Progress Notes (Signed)
Remote pacemaker transmission.   

## 2021-01-30 ENCOUNTER — Emergency Department (HOSPITAL_COMMUNITY)
Admission: EM | Admit: 2021-01-30 | Discharge: 2021-01-30 | Disposition: A | Discharge status: Home / Self Care | Payer: Medicare Other | Attending: Emergency Medicine | Admitting: Emergency Medicine

## 2021-01-30 ENCOUNTER — Emergency Department (HOSPITAL_COMMUNITY): Payer: Medicare Other

## 2021-01-30 ENCOUNTER — Encounter (HOSPITAL_COMMUNITY): Payer: Self-pay

## 2021-01-30 ENCOUNTER — Other Ambulatory Visit: Payer: Self-pay

## 2021-01-30 DIAGNOSIS — M79671 Pain in right foot: Secondary | ICD-10-CM | POA: Diagnosis not present

## 2021-01-30 DIAGNOSIS — Z853 Personal history of malignant neoplasm of breast: Secondary | ICD-10-CM | POA: Insufficient documentation

## 2021-01-30 DIAGNOSIS — Z85038 Personal history of other malignant neoplasm of large intestine: Secondary | ICD-10-CM | POA: Insufficient documentation

## 2021-01-30 DIAGNOSIS — Z950 Presence of cardiac pacemaker: Secondary | ICD-10-CM | POA: Diagnosis not present

## 2021-01-30 DIAGNOSIS — Z7901 Long term (current) use of anticoagulants: Secondary | ICD-10-CM | POA: Insufficient documentation

## 2021-01-30 DIAGNOSIS — S0990XA Unspecified injury of head, initial encounter: Secondary | ICD-10-CM | POA: Insufficient documentation

## 2021-01-30 DIAGNOSIS — S199XXA Unspecified injury of neck, initial encounter: Secondary | ICD-10-CM | POA: Insufficient documentation

## 2021-01-30 DIAGNOSIS — W19XXXA Unspecified fall, initial encounter: Secondary | ICD-10-CM

## 2021-01-30 DIAGNOSIS — Z87891 Personal history of nicotine dependence: Secondary | ICD-10-CM | POA: Insufficient documentation

## 2021-01-30 DIAGNOSIS — W1789XA Other fall from one level to another, initial encounter: Secondary | ICD-10-CM | POA: Diagnosis not present

## 2021-01-30 LAB — CBC WITH DIFFERENTIAL/PLATELET
Abs Immature Granulocytes: 0.04 10*3/uL (ref 0.00–0.07)
Basophils Absolute: 0 10*3/uL (ref 0.0–0.1)
Basophils Relative: 1 %
Eosinophils Absolute: 0.1 10*3/uL (ref 0.0–0.5)
Eosinophils Relative: 2 %
HCT: 41.1 % (ref 36.0–46.0)
Hemoglobin: 13.3 g/dL (ref 12.0–15.0)
Immature Granulocytes: 1 %
Lymphocytes Relative: 15 %
Lymphs Abs: 1.1 10*3/uL (ref 0.7–4.0)
MCH: 31.7 pg (ref 26.0–34.0)
MCHC: 32.4 g/dL (ref 30.0–36.0)
MCV: 98.1 fL (ref 80.0–100.0)
Monocytes Absolute: 1 10*3/uL (ref 0.1–1.0)
Monocytes Relative: 14 %
Neutro Abs: 4.8 10*3/uL (ref 1.7–7.7)
Neutrophils Relative %: 67 %
Platelets: 169 10*3/uL (ref 150–400)
RBC: 4.19 MIL/uL (ref 3.87–5.11)
RDW: 13.7 % (ref 11.5–15.5)
WBC: 7.1 10*3/uL (ref 4.0–10.5)
nRBC: 0 % (ref 0.0–0.2)

## 2021-01-30 LAB — COMPREHENSIVE METABOLIC PANEL
ALT: 8 U/L (ref 0–44)
AST: 25 U/L (ref 15–41)
Albumin: 3.3 g/dL — ABNORMAL LOW (ref 3.5–5.0)
Alkaline Phosphatase: 43 U/L (ref 38–126)
Anion gap: 7 (ref 5–15)
BUN: 15 mg/dL (ref 8–23)
CO2: 23 mmol/L (ref 22–32)
Calcium: 8.8 mg/dL — ABNORMAL LOW (ref 8.9–10.3)
Chloride: 103 mmol/L (ref 98–111)
Creatinine, Ser: 0.93 mg/dL (ref 0.44–1.00)
GFR, Estimated: >60 mL/min (ref >60)
Glucose, Bld: 108 mg/dL — ABNORMAL HIGH (ref 70–99)
Potassium: 4.8 mmol/L (ref 3.5–5.1)
Sodium: 133 mmol/L — ABNORMAL LOW (ref 135–145)
Total Bilirubin: 0.5 mg/dL (ref 0.3–1.2)
Total Protein: 6.5 g/dL (ref 6.5–8.1)

## 2021-01-30 NOTE — Discharge Instructions (Addendum)
You are seen and evaluated in our emergency department after sustaining a fall.  Of your head neck and right lower extremity was negative for acute injuries.  Like discussed in the emergency department I recommend follow-up imaging if you continue having pain in your right lower extremity.  Also recommend follow-up with PCP for evaluation of slurred speech.

## 2021-01-30 NOTE — ED Provider Triage Note (Addendum)
Emergency Medicine Provider Triage Evaluation Note  Julie Mcgee , a 88 y.o. female  was evaluated in triage.  Pt complains of mechanical fall that happened last night.  Patient states that she was try to get on the toilet, her leg gave out as she slipped on the floor.  She states she did hit her head, she is on Eliquis.    Review of Systems  Positive: Right ankle pain Negative: Headache, loss of consciousness, blurry vision, numbness  Physical Exam  BP (!) 160/64 (BP Location: Right Arm)    Pulse (!) 59    Temp 98.3 F (36.8 C) (Oral)    Resp 18    SpO2 99%  Gen:   Awake, no distress   Resp:  Normal effort  MSK:   Moves extremities without difficulty  Other:  Swollen right ankle, palpable PT pulse, 5/5 strength, no facial droop, no slurred speech  Medical Decision Making  Medically screening exam initiated at 11:32 AM.  Appropriate orders placed.  Julie Mcgee was informed that the remainder of the evaluation will be completed by another provider, this initial triage assessment does not replace that evaluation, and the importance of remaining in the ED until their evaluation is complete.    Julie Mcgee T, PA-C 01/30/21 1133

## 2021-01-30 NOTE — ED Provider Notes (Signed)
[facility] EMERGENCY DEPARTMENT Provider Note   CSN: [id number] Arrival date & time: 01/30/21  1116     History Chief Complaint  Patient presents with   fall/ankle swelling   Fall    Julie Mcgee is a 88 y.o. female with h/o stroke, CHF, colon cancer, active breast cancer on Tamoxifen presents to the ED for evaluation of right foot pain and head/neck pain after a mechanical fall on Eliquis last night. The patient reports that her left knee gave out making her land on her right foot wrong and hitting her head on the bathroom assist bar. The patient denies any blurry vision, abdominal pain, chest pain, SOB, or lightheadedness. The patient is COVID positive. Her son accompanies her and mentions that she was altered, had trouble forming sentences, and was confused two days ago and it lasted the whole day. He reports she has gradually been getting better and is at her baseline today. Patient is ambulatory with walker at baseline. Surgical history includes TABER, pacemaker, hip surgery, knee surgery, partial colectomy. Allergic to tape. Former smoker.   Fall Pertinent negatives include no chest pain, no abdominal pain, no headaches and no shortness of breath.      Home Medications Prior to Admission medications   Medication Sig Start Date End Date Taking? Authorizing Provider  apixaban (ELIQUIS) 5 MG TABS tablet Take 1 tablet (5 mg total) by mouth 2 (two) times daily. 03/14/20   Sueanne Margarita, MD  Calcium Carb-Cholecalciferol (CALTRATE 600+D3 PO) Take 1 tablet by mouth 2 (two) times daily.    [provider]  cholestyramine (QUESTRAN) 4 g packet Take 1 packet (4 g total) by mouth daily. Take 2 hour prior to morning meal 11/29/19   Cirigliano, [name] V, DO  furosemide (LASIX) 20 MG tablet Take 1 tablet (20 mg total) by mouth daily. Take 2 tablets (40 mg) twice daily for three days, then reduce to 1 tablet (20 mg total) once daily. 11/05/20   Sueanne Margarita, MD   levothyroxine (SYNTHROID) 100 MCG tablet Take 100 mcg by mouth daily before breakfast.     [provider]  metoprolol tartrate (LOPRESSOR) 50 MG tablet Take 50 mg by mouth 2 (two) times daily.    [provider]  Multiple Vitamins-Minerals (MULTIVITAMIN ADULTS 50+) TABS Take 1 tablet by mouth daily.    [provider]  pantoprazole (PROTONIX) 40 MG tablet Take 1 tablet (40 mg total) by mouth daily. 05/28/18   Shirley, [place], DO  Probiotic Product (PROBIOTIC DAILY PO) Take 1 tablet by mouth daily.    [provider]  simvastatin (ZOCOR) 40 MG tablet Take 40 mg by mouth daily.    [provider]  tamoxifen (NOLVADEX) 20 MG tablet Take 1 tablet (20 mg total) by mouth daily. 06/02/20   Truitt Merle, MD      Allergies    Adhesive [tape]    Review of Systems   Review of Systems  Constitutional:  Negative for chills and fever.  HENT:  Negative for ear pain and sore throat.   Eyes:  Negative for pain and visual disturbance.  Respiratory:  Negative for cough and shortness of breath.   Cardiovascular:  Negative for chest pain and palpitations.  Gastrointestinal:  Negative for abdominal pain, diarrhea, nausea and vomiting.  Genitourinary:  Negative for dysuria and hematuria.  Musculoskeletal:  Positive for arthralgias, myalgias and neck pain. Negative for back pain.  Skin:  Negative for color change and rash.  Neurological:  Negative for dizziness, seizures, syncope, facial asymmetry, weakness, light-headedness and headaches.  All other systems reviewed and are negative.  Physical Exam Updated Vital Signs BP (!) 160/64 (BP Location: Right Arm)    Pulse (!) 59    Temp 98.3 F (36.8 C) (Oral)    Resp 18    SpO2 99%  Physical Exam Vitals and nursing note reviewed.  Constitutional:      General: She is not in acute distress.    Appearance: Normal appearance. She is not toxic-appearing.  HENT:     Head: Normocephalic and atraumatic.     Comments: No  bony stepoffs or deformities noted. No overlying skin changes seen.  Eyes:     General: No scleral icterus.    Extraocular Movements: Extraocular movements intact.     Pupils: Pupils are equal, round, and reactive to light.  Neck:     Comments: Midline tenderness to the upper c-spine/lower occiput. No overlying skin changes. No step offs of deformities visualized or palpated.  Cardiovascular:     Rate and Rhythm: Normal rate and regular rhythm.  Pulmonary:     Effort: Pulmonary effort is normal. No respiratory distress.     Breath sounds: Normal breath sounds.  Abdominal:     General: Abdomen is flat. Bowel sounds are normal.     Palpations: Abdomen is soft.     Tenderness: There is no abdominal tenderness. There is no guarding or rebound.  Musculoskeletal:        General: No deformity.     Cervical back: Normal range of motion. Tenderness present.     Comments: No overlying skin changes, erythema, ecchymosis, rash, or abrasion noted to the area. No obvious deformity visualized or palpated. No paraspinal or midline tenderness to palpation to the thoracic, lumbar, sacral tenderness to palpation. No CVA tenderness bilaterally.  Overlying erythema, warmth, and swelling to the dorsum of the right foot and swelling to the ankle. Tender to touch. Cap refill 3 seconds. Patient has intact sensation and can wiggle toes. More tenderness to the lateral aspect of the foot.   Skin:    General: Skin is warm and dry.  Neurological:     General: No focal deficit present.     Mental Status: She is alert and oriented to person, place, and time. Mental status is at baseline.     Cranial Nerves: No cranial nerve deficit.     Sensory: No sensory deficit.     Motor: Weakness present.     Coordination: Coordination abnormal.     Comments: Weakness to bilateral legs.  Equal grip strength bilateral hands.  Upper extremity strength 5 out of 5.  Abnormal hand coordination.  No pronator drift.  Sensation intact.   Answers questions with appropriate speech.    ED Results / Procedures / Treatments   Labs (all labs ordered are listed, but only abnormal results are displayed) Labs Reviewed - No data to display  EKG None  Radiology DG Ankle Complete Right  Result Date: 01/30/2021 CLINICAL DATA:  Fall, right ankle pain EXAM: RIGHT ANKLE - COMPLETE 3+ VIEW COMPARISON:  None. FINDINGS: There is no evidence of fracture, dislocation, or joint effusion. The ankle mortise is intact. There is no evidence of arthropathy or other focal bone abnormality. Mild soft tissue swelling about the ankle. Vascular calcifications. IMPRESSION: No acute fracture or dislocation. Electronically Signed   By: Merilyn Baba M.D.   On: 01/30/2021 11:54   CT Head Wo Contrast  Result Date: 01/30/2021  CLINICAL DATA:  Head trauma, minor (Age >= 65y), fall EXAM: CT HEAD WITHOUT CONTRAST TECHNIQUE: Contiguous axial images were obtained from the base of the skull through the vertex without intravenous contrast. RADIATION DOSE REDUCTION: This exam was performed according to the departmental dose-optimization program which includes automated exposure control, adjustment of the mA and/or kV according to patient size and/or use of iterative reconstruction technique. COMPARISON:  11/01/2018 FINDINGS: Brain: There is no acute intracranial hemorrhage, mass effect, or edema. No new loss of gray-white differentiation. Large chronic bilateral cerebellar infarcts. But areas of low-density in the supratentorial white matter probably reflect moderate chronic microvascular ischemic changes. There is no extra-axial fluid collection. Prominence of the ventricles and sulci reflects parenchymal volume loss. Vascular: There is atherosclerotic calcification at the skull base. Skull: Calvarium is unremarkable. Sinuses/Orbits: No acute finding. Other: None. IMPRESSION: No evidence of acute intracranial injury. Electronically Signed   By: Macy Mis M.D.   On:  01/30/2021 12:32    Procedures Procedures   Medications Ordered in ED Medications - No data to display  ED Course/ Medical Decision Making/ A&P                           Medical Decision Making Amount and/or Complexity of Data Reviewed Labs: ordered. Radiology: ordered.   88 y/o F presents to the ED for evaluation of right foot pain, cervical pain from fall today and possible TIA on Wednesday. Differential diagnosis includes but is not limited to stroke, TIA, COVID, UTI, electrolyte abnormality, spine fracture, head bleed, foot fracture/dislocation, ankle fracture/dislocation. Vitals unremarkable.  Normotensive, afebrile, satting 90% on room air.  Physical exam shows some midline C-spine tenderness/occiput tenderness palpation without overlying skin changes or deformity noted.  Patient is weakness to bilateral legs, although suspect this is at patient's baseline.  Cranial nerves II through XII grossly intact.  She does have some abnormal coordination of her hands although patient does have a previous stroke in 2011/2012 approximately.  No pronator drift.  Patient does have some erythema and warmth over the dorsum of the right foot with swelling.  She is still able to move her toes but has some tenderness over the lateral aspect of the foot and ankle.  The son mentions that the erythema was not there before hand, however concern for cellulitis versus fracture.  Independently reviewed and interpreted the patient's imaging.  I agree with their radiologist findings of  no acute intracranial injury seen on head CT. Large chronic bilateral cerebellar infarcts. But areas of low-density in the supratentorial white matter probably reflect moderate chronic microvascular ischemic changes.  Plain film of the foot shows mild soft tissue swelling with vascular calcifications.  No overt fracture or dislocation seen.  As the patient's pain is more over the dorsum of her right foot, will add on plain film of right  foot.  Additionally, as the pain is more in her superior C-spine/occiput will order CT cervical spine.  Given the patient's altered mental status on Wednesday, but is not at baseline, use the ABCD squared score for TIA.  Patient at a moderate risk and would likely benefit from an MRI to r/o TIA. Labs and urinalysis also running to r/o electrolyte abnormalities or UTI for other explanations of the AMS. Score included below.   ABCD Score for TIA from [email or web address]  on 01/30/2021  RESULT SUMMARY: 4 points Per the validation study, 4-5 points: Moderate Risk 2-Day Stroke Risk: 4.1% 7-Day Stroke Risk:  5.9% 90-Day Stroke Risk: 9.8%  INPUTS: Age ? 60 years --> 1 = Yes BP ? 140/90 mmHg --> 0 = No Clinical features of the TIA --> 1 = Speech disturbance without weakness Duration of symptoms --> 2 = ? 60 minutes History of diabetes --> 0 = No  Labs and imaging pending. Will handoff patient at this time.   I discussed this case with my attending physician who cosigned this note including patient's presenting symptoms, physical exam, and planned diagnostics and interventions. Attending physician stated agreement with plan or made changes to plan which were implemented.   3:48 PM Care of Julie Mcgee  transferred to Dr. Judith Blonder at the end of my shift as the patient will require reassessment once labs/imaging have resulted. Patient presentation, ED course, and plan of care discussed with review of all pertinent labs and imaging. Please see his/her note for further details regarding further ED course and disposition. Plan at time of handoff is pending CT neck and XR of foot. Basic labs pending. Possible TIA, MRI inpatient versus in the ER. This may be altered or completely changed at the discretion of the oncoming team pending results of further workup.  Final Clinical Impression(s) / ED Diagnoses Final diagnoses:  None    Rx / DC Orders ED Discharge Orders     None         Sherrell Puller,  PA-C 01/30/21 1603    Davonna Belling, MD 01/31/21 [phone]

## 2021-01-30 NOTE — ED Triage Notes (Signed)
Pt BIB [facility] from [facility] c/o a fall that happened last night. Pt is c/o right ankle swelling and pain. Pt did hit her head and is on eliquis. Denies any LOC or blurred vision. Pt was trying to get on the toilet and her leg gave out when she slipped into the floor.

## 2021-01-30 NOTE — ED Provider Notes (Signed)
°  Physical Exam  BP 114/68 (BP Location: Right Arm)    Pulse (!) 59    Temp 98.3 F (36.8 C) (Oral)    Resp 17    SpO2 98%   Physical Exam Vitals and nursing note reviewed.  Constitutional:      General: She is not in acute distress.    Appearance: She is well-developed.  HENT:     Head: Normocephalic and atraumatic.  Eyes:     Extraocular Movements: Extraocular movements intact.     Conjunctiva/sclera: Conjunctivae normal.     Pupils: Pupils are equal, round, and reactive to light.  Abdominal:     Palpations: Abdomen is soft.  Musculoskeletal:        General: Tenderness (Over dorsum of right foot) present. No swelling.     Cervical back: Neck supple.  Skin:    General: Skin is warm and dry.     Capillary Refill: Capillary refill takes less than 2 seconds.     Findings: Erythema (Over dorsum of right foot) present.  Neurological:     General: No focal deficit present.     Mental Status: She is alert and oriented to person, place, and time. Mental status is at baseline.  Psychiatric:        Mood and Affect: Mood normal.    Procedures  Procedures  ED Course / MDM    Medical Decision Making Amount and/or Complexity of Data Reviewed Labs: ordered. Radiology: ordered.   This is an 88 year old female presents to emergency department for evaluation of fall.  History is obtained from both the patient and her son who is at bedside.  Reportedly the patient was diagnosed with COVID-19 on Tuesday.  On Wednesday evening the patient had a 2 to 3-hour episode where she had some expressive aphasia and was confused.  This resolved promptly on Thursday morning.  The patient reportedly had a mechanical fall earlier.  She complained of pain to her right lower extremity and stated that she did hit her head.  CT imaging of head and neck were negative for acute fracture, intracranial hemorrhage.  Plain films of her right ankle and right foot were negative.  Point-of-care ultrasound was  performed at bedside and did not reveal cobblestoning to support a diagnosis of cellulitis.  Patient was placed in a postop wedge shoe for immobilization of the foot.  Admission for possible TIA work-up was discussed with both the patient and the son who requested to complete this as an outpatient.  I agree with the patient and family member that this is reasonable.  Also recommended to the family that they should consider repeat x-rays of the right foot if she is continue to having pain with ambulation over the next week.  Strict return precautions were provided to the family.  Patient was discharged home self-care.         Zachery Dakins, MD 01/30/21 Margaret Pyle, MD 02/02/21 [phone]

## 2021-02-12 ENCOUNTER — Ambulatory Visit: Payer: Medicare Other | Admitting: Gastroenterology

## 2021-02-19 ENCOUNTER — Ambulatory Visit (INDEPENDENT_AMBULATORY_CARE_PROVIDER_SITE_OTHER): Payer: Medicare Other | Admitting: Gastroenterology

## 2021-02-19 ENCOUNTER — Other Ambulatory Visit: Payer: Self-pay

## 2021-02-19 ENCOUNTER — Encounter: Payer: Self-pay | Admitting: Gastroenterology

## 2021-02-19 ENCOUNTER — Ambulatory Visit: Payer: Medicare Other | Admitting: Gastroenterology

## 2021-02-19 VITALS — BP 142/84 | HR 62 | Ht 62.0 in

## 2021-02-19 DIAGNOSIS — R197 Diarrhea, unspecified: Secondary | ICD-10-CM

## 2021-02-19 DIAGNOSIS — K52832 Lymphocytic colitis: Secondary | ICD-10-CM | POA: Diagnosis not present

## 2021-02-19 DIAGNOSIS — K219 Gastro-esophageal reflux disease without esophagitis: Secondary | ICD-10-CM

## 2021-02-19 DIAGNOSIS — Z85038 Personal history of other malignant neoplasm of large intestine: Secondary | ICD-10-CM

## 2021-02-19 MED ORDER — BUDESONIDE ER 9 MG PO CP24: ORAL_CAPSULE | ORAL | 0 refills | Status: AC

## 2021-02-19 MED ORDER — FAMOTIDINE 40 MG PO TABS: 40.0000 mg | ORAL_TABLET | Freq: Every day | ORAL | 5 refills | Status: AC

## 2021-02-19 NOTE — Progress Notes (Signed)
Chief Complaint:    Diarrhea Lymphocytic Colitis, colon cancer surveillance  GI History: 88 year old female with h/o stage I (T1 N0 M0) Adenocarcinoma of the ascending colon (MSI high), diagnosed in 05/2018 during admission for symptomatic anemia.  Hemoglobin was 5.8 on admission, treated with PRBC transfusions.  Colonoscopy on that admission demonstrated ascending colon mass with biopsy-proven adenocarcinoma, s/p right hemicolectomy.  No indication for adjuvant chemotherapy or radiation.  Patient declined genetic testing. -11/02/2019: GI follow-up.  Main issue was loose stools with episodic fecal urgency - 11/28/2019: Colonoscopy: 3 small sigmoid polyps (path: TA x1, HP x2), Benign rectal hyperplastic polyps, otherwise normal colonic mucosa but biopsies demonstrate Lymphocytic Colitis.  Healthy-appearing anastomosis in transverse colon.  Small internal hemorrhoids.  Started Questran.  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 female presenting to the Gastroenterology Clinic for follow-up.  Last seen in the GI clinic by me on 11/02/2019.  Main issue at that time was loose stools with a few episodes of fecal urgency.  Colonoscopy completed in 11/2019 as above and notable for Lymphocytic Colitis.  Was started on Questran.  Since then, was diagnosed with bilateral Breast Cancer.  Not a surgical candidate due to mediastinal lymphadenopathy, and was started on tamoxifen.  Last seen by Dr. Annamaria Boots in the Oncology clinic on 11/06/2020.  PET/CT in 05/2020 without uptake in abdomen or GI  tract.  Today, she states Questran had been working, but she started having loose stools again for the last 4 months.  Increasing in frequency from 1-2 days every 2 weeks or so, to 1-2 days every week of loose, non-bloody stools. Still takes Questran daily as prescribed.  No hematochezia or melena.   CBC Latest Ref Rng & Units 01/30/2021 11/04/2020 05/21/2020  WBC 4.0 - 10.5 K/uL 7.1 9.3 7.9  Hemoglobin 12.0 - 15.0 g/dL 13.3 11.0(L) 13.0  Hematocrit 36.0 - 46.0 % 41.1 33.7(L) 39.9  Platelets 150 - 400 K/uL 169 287 221   CMP Latest Ref Rng & Units 01/30/2021 11/04/2020 11/04/2020  Glucose 70 - 99 mg/dL 108(H) 89 89  BUN 8 - 23 mg/dL _0 Creatinine 0.44 - 1.00 mg/dL 0.93 0.86 0.85  Sodium 135 - 145 mmol/L 133(L) 135 136  Potassium 3.5 - 5.1 mmol/L 4.8 4.7 4.7  Chloride 98 - 111 mmol/L 103 102 103  CO2 22 - 32 mmol/L _1 Calcium 8.9 - 10.3 mg/dL 8.8(L) 9.0 9.1  Total Protein 6.5 - 8.1 g/dL 6.5 7.0 -  Total Bilirubin 0.3 - 1.2 mg/dL 0.5 0.6 -  Alkaline Phos 38 - 126 U/L 43 48 -  AST 15 - 41 U/L 25 13(L) -  ALT 0 - 44 U/L 8 6 -      Review of systems:     No chest pain, no SOB, no fevers, no urinary sx   Past Medical History:  Diagnosis Date   Arthritis    Carotid artery stenosis    Chronic diastolic CHF (congestive heart failure) (HCC)    Colon cancer (HCC)    Family history of skin cancer    Former smoker  1/2 ppd for 45 years   History of CVA (cerebrovascular accident) 2011   with residual R hemiparesis   Hyperlipidemia    Hypertension    Hypothyroidism    PAF (paroxysmal atrial fibrillation) ([place])    Peripheral vascular disease (HCC)    RBBB    S/P TAVR (transcatheter aortic valve replacement) 02/28/2018   23 mm Edwards Sapien 3 transcatheter heart valve placed via percutaneous right transfemoral approach    Severe aortic stenosis    2D echo 02/2019 with severe AS and moderate to severe AI    Patient's surgical history, family medical history, social  history, medications and allergies were all reviewed in Epic    Current Outpatient Medications  Medication Sig Dispense Refill   apixaban (ELIQUIS) 5 MG TABS tablet Take 1 tablet (5 mg total) by mouth 2 (two) times daily. 180 tablet 1   Calcium Carb-Cholecalciferol (CALTRATE 600+D3 PO) Take 1 tablet by mouth 2 (two) times daily.     cholestyramine (QUESTRAN) 4 g packet Take 1 packet (4 g total) by mouth daily. Take 2 hour prior to morning meal 30 each 11   furosemide (LASIX) 20 MG tablet Take 1 tablet (20 mg total) by mouth daily. Take 2 tablets (40 mg) twice daily for three days, then reduce to 1 tablet (20 mg total) once daily. 90 tablet 3   levothyroxine (SYNTHROID) 100 MCG tablet Take 100 mcg by mouth daily before breakfast.      metoprolol tartrate (LOPRESSOR) 50 MG tablet Take 50 mg by mouth 2 (two) times daily.     Multiple Vitamins-Minerals (MULTIVITAMIN ADULTS 50+) TABS Take 1 tablet by mouth daily.     pantoprazole (PROTONIX) 40 MG tablet Take 1 tablet (40 mg total) by mouth daily. 30 tablet 0   Probiotic Product (PROBIOTIC DAILY PO) Take 1 tablet by mouth daily.     simvastatin (ZOCOR) 40 MG tablet Take 40 mg by mouth daily.     tamoxifen (NOLVADEX) 20 MG tablet Take 1 tablet (20 mg total) by mouth daily. 30 tablet 3   No current facility-administered medications for this visit.    Physical Exam:     There were no vitals taken for this visit.  GENERAL:  Pleasant female in NAD PSYCH: : Cooperative, normal affect EENT:  conjunctiva pink, mucous membranes moist, neck supple without masses ABDOMEN:  Nondistended, soft, nontender.  NEURO: Alert and oriented x 3, hard of hearing but well conversive   IMPRESSION and PLAN:    1) Lymphocytic Colitis - Some response to trial of cholestyramine, but increasing symptoms in the last 4 months or so - Start budesonide 9 mg daily for 8 weeks then taper to 6 mg x 4 weeks, 3 mg x 4 weeks, discontinue.  If symptoms persist or recur during  tapering, continue budesonide 9 mg x 12 weeks then slowly taper. - Okay to use Imodium prn - Will stop cholestyramine when starting budesonide - We will make empiric change of Protonix to Pepcid 40 mg/day for possibly some reduced risk of Microscopic colitis  2) GERD - Reflux symptoms well controlled.  As above, making empiric change from Protonix to Pepcid.  If reflux symptoms still well controlled at 40 mg/day, will titrate to lowest effective dose, or potentially off completely if no further reflux symptoms  3) History of colon cancer - History of stage I (T1 N0 M0) Adenocarcinoma of the ascending colon (MSI high), diagnosed in 05/2018 during hospital admission for symptomatic anemia s/p right hemicolectomy on  that admission. - Due for repeat colon cancer surveillance colonoscopy in 11/2022 - PET scan 05/2020 was without uptake in the abdomen or GI tract - Continue follow-up in the Hematology/Oncology clinic  4) Breast cancer - Continue follow-up in the Hematology/Oncology clinic          Lavena Bullion ,DO, FACG 02/19/2021, 1:24 PM

## 2021-02-19 NOTE — Patient Instructions (Addendum)
If you are age 86 or older, your body mass index should be between 23-30. Your Body mass index is 38.41 kg/m. If this is out of the aforementioned range listed, please consider follow up with your Primary Care Provider.  If you are age 64 or younger, your body mass index should be between 19-25. Your Body mass index is 38.41 kg/m. If this is out of the aformentioned range listed, please consider follow up with your Primary Care Provider.   __________________________________________________________  The [facility] providers would like to encourage you to use [facility] to communicate with providers for non-urgent requests or questions.  Due to long hold times on the telephone, sending your provider a message by [facility] may be a faster and more efficient way to get a response.  Please allow 48 business hours for a response.  Please remember that this is for non-urgent requests.    We have sent the following medications to your pharmacy for you to pick up at your convenience: Budesonide and pepcid  Stop protonix and Questran  Follow up in 6 months  Thank you for choosing me and [facility].  [name] Cirigliano, D.O.

## 2021-02-23 ENCOUNTER — Telehealth: Payer: Self-pay | Admitting: Hematology

## 2021-02-23 ENCOUNTER — Telehealth: Payer: Self-pay | Admitting: Gastroenterology

## 2021-02-23 NOTE — Telephone Encounter (Signed)
Contacted the patients son and he stated that they were told that her portion was 900. He had no other information than that. Explained to him that he needs to call the nursing home and find out exactly what the ins was going to pay. He asked if we could not get this covered is there an alternative that is cheaper?

## 2021-02-23 NOTE — Telephone Encounter (Signed)
I am unsure why the budesonide would not be covered, but $109/NATFT is certainly quite costly.  She was previously being treated with cholestyramine.  There are data to show short course of high-frequency cholestyramine coupled with loperamide may induce remission.  Lets plan for the following:  - Cholestyramine 4 g qid x2 weeks, then reduce to bid x2 weeks, then can hopefully get to daily dosing - Loperamide bid x2 weeks, then reduce to qhs x2 weeks, then prn.  If requiring long-term qhs use to hold off on nocturnal symptoms, that is perfectly reasonable - If no appreciable response to this medication trial, will use bismuth subsalicylate three 732 mg tablets 3 times daily - May also need to revisit whether or not she needs ongoing use of acid suppression medications.  We changed her PPI to an H2 blocker, but if needed, may need to stop the H2 blocker as well.  Will determine that based on response to the above.

## 2021-02-23 NOTE — Telephone Encounter (Signed)
Patients son called states the Budesonide medication is costing her $900.00 seeking an alternative.

## 2021-02-23 NOTE — Telephone Encounter (Signed)
Called patient to schedule appointment per 2/14 inbasket, patient is aware of date and time.

## 2021-02-24 NOTE — Telephone Encounter (Signed)
Contacted the patients son and he stated that he wants to wait to do anything as far as meds until we get back from [facility] there pa decision. Then they will decide from there. Meanwhile he stated that his mother has been given a 7 day supply from the nursing home she is in. Will stated he will call us if hears anything about the PA for budesonide and I expressed I would do the same.

## 2021-02-24 NOTE — Telephone Encounter (Signed)
PA request came through from patients insurance to send to cover my meds for budesonide. Sent in today. Will see if we can get it covered.

## 2021-02-25 NOTE — Telephone Encounter (Signed)
Left a voicemail for the patients son that we received authorization from her insurance for Budesonide. I suggested he call the pharmacy and find out what her copayment is. Patient was instructed to contact the office back to let us know if he is going to stay with the original treatment plan of budesonide.

## 2021-02-26 ENCOUNTER — Telehealth: Payer: Self-pay | Admitting: Hematology

## 2021-03-03 ENCOUNTER — Telehealth: Payer: Self-pay | Admitting: Hematology

## 2021-03-03 NOTE — Telephone Encounter (Signed)
R/s per 2/20 inbasket , left msg with pt son

## 2021-03-05 ENCOUNTER — Other Ambulatory Visit: Payer: Medicare Other

## 2021-03-05 ENCOUNTER — Ambulatory Visit: Payer: Medicare Other | Admitting: Hematology

## 2021-03-06 ENCOUNTER — Other Ambulatory Visit: Payer: Self-pay

## 2021-03-06 ENCOUNTER — Other Ambulatory Visit: Payer: Medicare Other

## 2021-03-06 ENCOUNTER — Inpatient Hospital Stay: Payer: Medicare Other | Attending: Adult Health

## 2021-03-06 DIAGNOSIS — C50811 Malignant neoplasm of overlapping sites of right female breast: Secondary | ICD-10-CM

## 2021-03-06 DIAGNOSIS — Z85038 Personal history of other malignant neoplasm of large intestine: Secondary | ICD-10-CM | POA: Insufficient documentation

## 2021-03-06 DIAGNOSIS — C773 Secondary and unspecified malignant neoplasm of axilla and upper limb lymph nodes: Secondary | ICD-10-CM | POA: Insufficient documentation

## 2021-03-06 DIAGNOSIS — Z79810 Long term (current) use of selective estrogen receptor modulators (SERMs): Secondary | ICD-10-CM | POA: Insufficient documentation

## 2021-03-06 DIAGNOSIS — C50212 Malignant neoplasm of upper-inner quadrant of left female breast: Secondary | ICD-10-CM | POA: Insufficient documentation

## 2021-03-06 DIAGNOSIS — Z87891 Personal history of nicotine dependence: Secondary | ICD-10-CM | POA: Insufficient documentation

## 2021-03-06 DIAGNOSIS — C50411 Malignant neoplasm of upper-outer quadrant of right female breast: Secondary | ICD-10-CM | POA: Diagnosis present

## 2021-03-06 DIAGNOSIS — Z79899 Other long term (current) drug therapy: Secondary | ICD-10-CM | POA: Diagnosis not present

## 2021-03-06 DIAGNOSIS — Z7901 Long term (current) use of anticoagulants: Secondary | ICD-10-CM | POA: Insufficient documentation

## 2021-03-06 DIAGNOSIS — Z170 Estrogen receptor positive status [ER+]: Secondary | ICD-10-CM | POA: Insufficient documentation

## 2021-03-06 LAB — COMPREHENSIVE METABOLIC PANEL
ALT: 9 U/L (ref 0–44)
AST: 14 U/L — ABNORMAL LOW (ref 15–41)
Albumin: 4.1 g/dL (ref 3.5–5.0)
Alkaline Phosphatase: 51 U/L (ref 38–126)
Anion gap: 4 — ABNORMAL LOW (ref 5–15)
BUN: 16 mg/dL (ref 8–23)
CO2: 32 mmol/L (ref 22–32)
Calcium: 9.8 mg/dL (ref 8.9–10.3)
Chloride: 101 mmol/L (ref 98–111)
Creatinine, Ser: 1 mg/dL (ref 0.44–1.00)
GFR, Estimated: 55 mL/min — ABNORMAL LOW (ref >60)
Glucose, Bld: 92 mg/dL (ref 70–99)
Potassium: 4.4 mmol/L (ref 3.5–5.1)
Sodium: 137 mmol/L (ref 135–145)
Total Bilirubin: 0.6 mg/dL (ref 0.3–1.2)
Total Protein: 7.6 g/dL (ref 6.5–8.1)

## 2021-03-06 LAB — CBC WITH DIFFERENTIAL/PLATELET
Abs Immature Granulocytes: 0.04 10*3/uL (ref 0.00–0.07)
Basophils Absolute: 0 10*3/uL (ref 0.0–0.1)
Basophils Relative: 0 %
Eosinophils Absolute: 0.1 10*3/uL (ref 0.0–0.5)
Eosinophils Relative: 1 %
HCT: 42.7 % (ref 36.0–46.0)
Hemoglobin: 14.1 g/dL (ref 12.0–15.0)
Immature Granulocytes: 0 %
Lymphocytes Relative: 10 %
Lymphs Abs: 0.9 10*3/uL (ref 0.7–4.0)
MCH: 31.4 pg (ref 26.0–34.0)
MCHC: 33 g/dL (ref 30.0–36.0)
MCV: 95.1 fL (ref 80.0–100.0)
Monocytes Absolute: 0.6 10*3/uL (ref 0.1–1.0)
Monocytes Relative: 7 %
Neutro Abs: 7.5 10*3/uL (ref 1.7–7.7)
Neutrophils Relative %: 82 %
Platelets: 250 10*3/uL (ref 150–400)
RBC: 4.49 MIL/uL (ref 3.87–5.11)
RDW: 14.6 % (ref 11.5–15.5)
WBC: 9.2 10*3/uL (ref 4.0–10.5)
nRBC: 0 % (ref 0.0–0.2)

## 2021-03-11 ENCOUNTER — Telehealth: Payer: Self-pay | Admitting: Gastroenterology

## 2021-03-11 ENCOUNTER — Telehealth: Payer: Medicare Other | Admitting: Adult Health

## 2021-03-11 ENCOUNTER — Other Ambulatory Visit: Payer: Self-pay

## 2021-03-11 DIAGNOSIS — R197 Diarrhea, unspecified: Secondary | ICD-10-CM

## 2021-03-11 NOTE — Telephone Encounter (Signed)
Spoke with pt. Pt reports she has 1 episode of watery diarrhea per day. Pt states she has no abd pain just urgency and is sometimes incontinent of stool. Pt states that diarrhea started around the time of her last office visit on 02/19/21. Asked pt if she was able to start budesonide yet and pt was unsure. Spoke with pt's nurse at [facility] and she stated that pt has been on budesonide for about 2 weeks.  ?

## 2021-03-11 NOTE — Telephone Encounter (Signed)
Cirigliano, Dominic Pea DO  Marice Potter, RN ?I forgot to put in my note, but also reasonable to check GI PCR panel. Thanks.  ? ?Spoke with pt's son. Pt son states that pt is post stroke and always thinks she has diarrhea when she doesn't. He requested we hold off on sending the Questran until he talks with his mom. Pt's son agreed to stool test. Order placed for GI profile. Pt's son stated he would call us back to let us know about the [place].  ?

## 2021-03-11 NOTE — Telephone Encounter (Signed)
Inbound call from patient states she have been experiencing painful watery diarrhea. Best contact number [phone] ?

## 2021-03-11 NOTE — Telephone Encounter (Signed)
Symptoms sound like they were better controlled when she was taking the Questran.  This was stopped at the time of starting budesonide 9 mg/day.  Plan for the following: ? ?- Continue budesonide as prescribed ?- Start Questran 4 g/day.  If no appreciable response after about 7-10 days, increase to 4 g bid ?- Please remind patient and those administering her medications to split Questran away from other medications ?

## 2021-03-13 ENCOUNTER — Other Ambulatory Visit: Payer: Self-pay

## 2021-03-13 MED ORDER — CHOLESTYRAMINE 4 G PO PACK: 4.0000 g | PACK | Freq: Every day | ORAL | 3 refills | Status: DC

## 2021-03-13 NOTE — Telephone Encounter (Signed)
Spoke with nurse at [facility] and let her know that Julie Mcgee was sent to their pharmacy. Also let nurse know that Julie Mcgee would need to be separated from other medications and that if diarrhea is not improved in 7-10 days we can send in another prescription so she can take it twice a day.  ?

## 2021-03-13 NOTE — Telephone Encounter (Signed)
Pt's son called back and stated that pt is having diarrhea and needs the Questran. Sent prescription to pt's pharmacy and let son know to call us back if symptoms do not improve in 7-10 days and we can increase the Questran to BID.  ?

## 2021-03-16 ENCOUNTER — Inpatient Hospital Stay: Payer: Medicare Other | Attending: Adult Health | Admitting: Adult Health

## 2021-03-16 DIAGNOSIS — C50811 Malignant neoplasm of overlapping sites of right female breast: Secondary | ICD-10-CM | POA: Diagnosis not present

## 2021-03-16 NOTE — Progress Notes (Signed)
[facility] Follow up:    Julie Caprio, NP [place] [address]  I connected with Julie Mcgee on 03/16/21 at 10:45 AM EST by telephone and verified that I am speaking with the correct person using two identifiers.  I discussed the limitations, risks, security and privacy concerns of performing an evaluation and management service by telephone and the availability of in person appointments.  I also discussed with the patient that there may be a patient responsible charge related to this service. The patient expressed understanding and agreed to proceed.  Patient location: home Provider location: [place] office Others participating: Daughter-in-law Julie Mcgee  DIAGNOSIS:  Cancer Staging  Malignant neoplasm of ascending colon [facility]) Staging form: Colon and Rectum, AJCC 8th Edition - Pathologic stage from 05/24/2018: Stage I (pT1, pN0, cM0) - Signed by Tish Men, MD on 06/07/2018 Total positive nodes: 0  Malignant neoplasm of overlapping sites of right breast [facility]) Staging form: Breast, AJCC 8th Edition - Clinical stage from 05/21/2020: Stage IIA (cT2, cN1(f), cM0, G2, ER+, PR+, HER2-) - Unsigned Stage prefix: Initial diagnosis Method of lymph node assessment: Core biopsy Histologic grading system: 3 grade system  Malignant neoplasm of upper-inner quadrant of left breast in female, estrogen receptor positive ([place]) Staging form: Breast, AJCC 8th Edition - Clinical stage from 05/21/2020: Stage IA (cT1a, cN0, cM0, G2, ER+, PR+, HER2-) - Unsigned Stage prefix: Initial diagnosis Method of lymph node assessment: Clinical Histologic grading system: 3 grade system   SUMMARY OF ONCOLOGIC HISTORY: Oncology History Overview Note  Cancer Staging Malignant neoplasm of ascending colon [facility]) Staging form: Colon and Rectum, AJCC 8th Edition - Pathologic stage from 05/24/2018: Stage I (pT1, pN0, cM0) - Signed by Tish Men, MD on 06/07/2018 Total positive  nodes: 0  Malignant neoplasm of overlapping sites of right breast [facility]) Staging form: Breast, AJCC 8th Edition - Clinical stage from 05/21/2020: Stage IIA (cT2, cN1(f), cM0, G2, ER+, PR+, HER2-) - Unsigned Stage prefix: Initial diagnosis Method of lymph node assessment: Core biopsy Histologic grading system: 3 grade system  Malignant neoplasm of upper-inner quadrant of left breast in female, estrogen receptor positive ([place]) Staging form: Breast, AJCC 8th Edition - Clinical stage from 05/21/2020: Stage IA (cT1a, cN0, cM0, G2, ER+, PR+, HER2-) - Unsigned Stage prefix: Initial diagnosis Method of lymph node assessment: Clinical Histologic grading system: 3 grade system    Malignant neoplasm of ascending colon [facility])   Initial Diagnosis   Malignant neoplasm of ascending colon ([place])   05/19/2018 Procedure   Colonoscopy: - Likely malignant tumor in the ascending colon. Biopsied. Tattooed. This is the source of the anemia. - A few 2 to 5 mm polyps in the rectum, in the sigmoid colon and in the ascending colon. - Non-bleeding internal hemorrhoids. - The examined portion of the ileum was normal.   05/19/2018 Pathology Results   1. Duodenum, Biopsy - BENIGN SMALL BOWEL MUCOSA. - NO VILLOUS BLUNTING OR INCREASE IN INTRAEPITHELIAL LYMPHOCYTES. - NO DYSPLASIA OR MALIGNANCY. 2. Stomach, polyp(s) - FUNDIC GLAND POLYP. - WARTHIN-STARRY IS NEGATIVE FOR HELICOBACTER PYLORI. - NO INTESTINAL METAPLASIA, DYSPLASIA, OR MALIGNANCY. 3. Colon, biopsy, ascending - INVASIVE ADENOCARCINOMA.   05/20/2018 Imaging   CT CAP: IMPRESSION: 1. No CT findings of the abdomen or pelvis to localize suspected GI bleeding. No intraluminal contrast noted.   2. Focal soft tissue thickening of the colon wall superior to the ileocecal valve may reflect reported ascending colon mass.   3. There is extensive  fatty mural stratification of the cecum, suggesting chronic inflammatory sequelae of colitis, including Crohn's  disease.   4. Moderate right, small left pleural effusions and associated atelectasis or consolidation. There is mild, diffuse interlobular septal thickening, most consistent with edema.   5.  Interval placement of aortic valve stent endograft.   6. There is a lobulated 2.6 cm mass in the central right breast (series 3, image 39), as seen on prior examination. Correlate with mammography.   7.  Other chronic and incidental findings as detailed above.   05/24/2018 Pathology Results   Accession: [id number]  Procedure: Right colon and ileum resection. Tumor Site: Proximal right colon. Tumor Size: 2.5 cm. Macroscopic Tumor Perforation: Not identified. Histologic Type: Invasive adenocarcinoma. Histologic Grade: G2, moderately differentiated. Tumor Extension: Into submucosa. Margins: Negative. Treatment Effect: N/A. Lymphovascular Invasion: N/A. Perineural Invasion: N/A. Tumor Deposits: Not identified. Regional Lymph Nodes: Number of Lymph Nodes Involved: 0 Number of Lymph Nodes Examined: 18 Pathologic Stage Classification (pTNM, AJCC 8th Edition): pT1, pN0 Ancillary Studies: MSI will be ordered. MMR was performed on the biopsy and showed loss of MLH1 and PMS2.  MSI-high, BRAF [id number] mutation present    05/24/2018 Cancer Staging   Staging form: Colon and Rectum, AJCC 8th Edition - Pathologic stage from 05/24/2018: Stage I (pT1, pN0, cM0) - Signed by Tish Men, MD on 06/07/2018    Malignant neoplasm of upper-inner quadrant of left breast in female, estrogen receptor positive ([place])  04/28/2020 Mammogram   Mammogram 04/28/20  The 3.2cm irregular mass in the right breast at 12:00 position middle depth, 5cmfn, is highly suggesitve of malignancy. An US guided biopsy is recommended.   the 0.6cm mass in the left breast upper inner aspect posterior depth, 11cmfn is indetermineate. There also are fine branching linear calcifications spanning 1.4cm in teh left breast at 6:00 position middle  depth 8.5cmfn. An [place] is reocmmended.      05/14/2020 Initial Biopsy   1.Breast, Left, needle core biopsy, 10:00 position, middle depth, 8cmfn -INVASIVE DUCTAL CARCINOMA 2. breast, right, needle core biopsy, 12:00 position, middle depth, 8cmfn -INVASIVE DUCTAL CARCINOMA 3. Lymph node, biopsy, axillary -METASTATIC CARCINOMA INVOLVING NODAL TISSUE 4. Breast, left needle core biopsy, 6:00 position, 7.3cmfn -FOCAL ATYPICAL DUCTAL HYPERPLASIA WITH CALCIFICATIONS.   1. Grade 2-3 measures 0.4cm  2. Grade 2-3 and measurers 1cm    05/16/2020 Initial Diagnosis   Malignant neoplasm of upper-inner quadrant of left breast in female, estrogen receptor positive ([place])   Malignant neoplasm of upper-outer quadrant of right breast in female, estrogen receptor positive ([place])  04/28/2020 Mammogram   Mammogram 04/28/20  The 3.2cm irregular mass in the right breast at 12:00 position middle depth, 5cmfn, is highly suggesitve of malignancy. An US guided biopsy is recommended.   the 0.6cm mass in the left breast upper inner aspect posterior depth, 11cmfn is indetermineate. There also are fine branching linear calcifications spanning 1.4cm in teh left breast at 6:00 position middle depth 8.5cmfn. An [place] is reocmmended.      05/14/2020 Initial Biopsy   1.Breast, Left, needle core biopsy, 10:00 position, middle depth, 8cmfn -INVASIVE DUCTAL CARCINOMA 2. breast, right, needle core biopsy, 12:00 position, middle depth, 8cmfn -INVASIVE DUCTAL CARCINOMA 3. Lymph node, biopsy, axillary -METASTATIC CARCINOMA INVOLVING NODAL TISSUE 4. Breast, left needle core biopsy, 6:00 position, 7.3cmfn -FOCAL ATYPICAL DUCTAL HYPERPLASIA WITH CALCIFICATIONS.   1. Grade 2-3 measures 0.4cm  2. Grade 2-3 and measurers 1cm    05/21/2020 Initial Diagnosis   Malignant neoplasm of upper-outer quadrant of  right breast in female, estrogen receptor positive ([place])     CURRENT THERAPY: Tamoxifen  INTERVAL HISTORY: Julie Mcgee 88 y.o.  female returns for f/u of her estrogen positive breast cancer.  She is taking tamoxifen daily with good tolerance.  She denies any hot flashes, vaginal discharge, joint aches and pains.  She is taking Eliquis as well and has no easy bruising or bleeding.  Her most recent mammogram was completed in December 2022 and it showed that her known breast cancer was smaller than prior.  We discussed either doing a mammogram in 3 months or at 6 months.  The patient has let me know that she prefers to wait 6 months as she really does not like mammograms.     Patient Active Problem List   Diagnosis Date Noted   Malignant neoplasm of overlapping sites of right breast ([place]) 05/21/2020   Malignant neoplasm of upper-outer quadrant of right breast in female, estrogen receptor positive ([place]) 05/21/2020   Malignant neoplasm of upper-inner quadrant of left breast in female, estrogen receptor positive ([place]) 05/16/2020   Atherosclerotic heart disease of native coronary artery without angina pectoris 03/14/2020   Cerebrovascular accident ([place]) 03/14/2020   Pure hypercholesterolemia 03/14/2020   Pacemaker 09/03/2019   Orthostatic syncope 11/02/2018   Syncope 11/01/2018   Rash 11/01/2018   Family history of skin cancer    Chronic diastolic CHF (congestive heart failure) ([place]) 06/19/2018   Breast lesion 06/07/2018   Malignant neoplasm of ascending colon (HCC)    Colonic mass    Hiatal hernia    Gastrointestinal hemorrhage    Anticoagulated    Acute upper GI bleed 05/17/2018   Symptomatic anemia    Complete heart block ([place]) 03/02/2018   Sinus bradycardia    Cardiac arrest (HCC)    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   23 mm Edwards Sapien 3 transcatheter heart valve placed via percutaneous right transfemoral approach    Severe aortic stenosis    2D echo 02/2019 with severe AS and moderate to severe AI    SURGICAL HISTORY: Past Surgical History:  Procedure Laterality Date   BASAL CELL CARCINOMA EXCISION  2018   BIOPSY  05/19/2018   Procedure: BIOPSY;  Surgeon: Lavena Bullion, DO;  Location: [facility];  Service: Gastroenterology;;   CARDIAC CATHETERIZATION     COLONOSCOPY  2001   COLONOSCOPY WITH PROPOFOL N/A 05/19/2018   Procedure: COLONOSCOPY WITH PROPOFOL;  Surgeon: Lavena Bullion, DO;  Location: [facility];  Service: Gastroenterology;  Laterality: N/A;   ESOPHAGOGASTRODUODENOSCOPY (EGD) WITH PROPOFOL N/A  05/19/2018   Procedure: ESOPHAGOGASTRODUODENOSCOPY (EGD) WITH PROPOFOL;  Surgeon: Lavena Bullion, DO;  Location: [place] [facility];  Service: Gastroenterology;  Laterality: N/A;   HIP SURGERY  2005   KNEE SURGERY  1998   LAPAROSCOPIC PARTIAL COLECTOMY N/A 05/24/2018   Procedure: LAPAROSCOPIC ASSISTED ASCENDING HEMICOLECTOMY;  Surgeon: Ileana Roup, MD;  Location: [place];  Service: General;  Laterality: N/A;   PACEMAKER IMPLANT N/A 03/02/2018   Procedure: PACEMAKER IMPLANT;  Surgeon: Evans Lance, MD;  Location: [place] [facility];  Service: Cardiovascular;  Laterality: N/A;   RIGHT/LEFT HEART CATH AND CORONARY ANGIOGRAPHY N/A 02/21/2018    Procedure: RIGHT/LEFT HEART CATH AND CORONARY ANGIOGRAPHY;  Surgeon: Sherren Mocha, MD;  Location: [place] [facility];  Service: Cardiovascular;  Laterality: N/A;   SUBMUCOSAL TATTOO INJECTION  05/19/2018   Procedure: SUBMUCOSAL TATTOO INJECTION;  Surgeon: Lavena Bullion, DO;  Location: [place];  Service: Gastroenterology;;   TEE WITHOUT CARDIOVERSION N/A 02/28/2018   Procedure: TRANSESOPHAGEAL ECHOCARDIOGRAM (TEE);  Surgeon: Sherren Mocha, MD;  Location: [place] [facility];  Service: Open Heart Surgery;  Laterality: N/A;   TEMPORARY PACEMAKER N/A 03/01/2018   Procedure: TEMPORARY PACEMAKER;  Surgeon: Burnell Blanks, MD;  Location: [place] [facility];  Service: Cardiovascular;  Laterality: N/A;   TONSILLECTOMY     TRANSCATHETER AORTIC VALVE REPLACEMENT, TRANSFEMORAL N/A 02/28/2018   Procedure: TRANSCATHETER AORTIC VALVE REPLACEMENT, TRANSFEMORAL;  Surgeon: Sherren Mocha, MD;  Location: [place] [facility];  Service: Open Heart Surgery;  Laterality: N/A;    SOCIAL HISTORY: Social History   Socioeconomic History   Marital status: Married    Spouse name: Not on file   Number of children: 4   Years of education: Not on file   Highest education level: Some college, no degree  Occupational History   Not on file  Tobacco Use   Smoking status: Former    Packs/day: 1.00    Years: 45.00    Pack years: 45.00    Types: Cigarettes    Quit date: 05/23/2009    Years since quitting: 11.8   Smokeless tobacco: Never  Vaping Use   Vaping Use: Never used  Substance and Sexual Activity   Alcohol use: Not Currently   Drug use: Never   Sexual activity: Not on file  Other Topics Concern   Not on file  Social History Narrative   [facility] she has 4 children   She is right handed   Drinks coffee sometimes and sometimes tea and soda   Social Determinants of Radio broadcast assistant Strain: Not on file  Food Insecurity: Not on file   Transportation Needs: Not on file  Physical Activity: Not on file  Stress: Not on file  Social Connections: Not on file  Intimate Partner Violence: Not on file    FAMILY HISTORY: Family History  Problem Relation Age of Onset   Heart failure Mother    Chronic Renal Failure Mother    Alcohol abuse Father    Skin cancer Sister 33   Skin cancer Sister 20   Colon cancer Neg Hx    Esophageal cancer Neg Hx    Pancreatic cancer Neg Hx    Stomach cancer Neg Hx     Review of Systems  Constitutional:  Negative for appetite change, chills, fatigue, fever and unexpected weight change.  HENT:   Negative for hearing loss, lump/mass and trouble swallowing.   Eyes:  Negative for eye problems  and icterus.  Respiratory:  Negative for chest tightness, cough and shortness of breath.   Cardiovascular:  Negative for chest pain, leg swelling and palpitations.  Gastrointestinal:  Negative for abdominal distention, abdominal pain, constipation, diarrhea, nausea and vomiting.  Endocrine: Negative for hot flashes.  Genitourinary:  Negative for difficulty urinating.   Musculoskeletal:  Negative for arthralgias.  Skin:  Negative for itching and rash.  Neurological:  Negative for dizziness, extremity weakness, headaches and numbness.  Hematological:  Negative for adenopathy. Does not bruise/bleed easily.  Psychiatric/Behavioral:  Negative for depression. The patient is not nervous/anxious.      PHYSICAL EXAMINATION  ECOG PERFORMANCE STATUS: 1 - Symptomatic but completely ambulatory  Patient sounds well she is in no apparent distress.  Breathing is nonlabored mood and behavior is normal.  LABORATORY DATA:  CBC    Component Value Date/Time   WBC 9.2 03/06/2021 1037   RBC 4.49 03/06/2021 1037   HGB 14.1 03/06/2021 1037   HGB 13.0 05/21/2020 1211   HGB 10.1 (L) 09/11/2018 1057   HCT 42.7 03/06/2021 1037   HCT 32.2 (L) 09/11/2018 1057   PLT 250 03/06/2021 1037   PLT 221 05/21/2020 1211   PLT  342 09/11/2018 1057   MCV 95.1 03/06/2021 1037   MCV 84 09/11/2018 1057   MCH 31.4 03/06/2021 1037   MCHC 33.0 03/06/2021 1037   RDW 14.6 03/06/2021 1037   RDW 16.7 (H) 09/11/2018 1057   LYMPHSABS 0.9 03/06/2021 1037   LYMPHSABS 1.3 03/16/2018 1326   MONOABS 0.6 03/06/2021 1037   EOSABS 0.1 03/06/2021 1037   EOSABS 0.8 (H) 03/16/2018 1326   BASOSABS 0.0 03/06/2021 1037   BASOSABS 0.1 03/16/2018 1326    CMP     Component Value Date/Time   NA 137 03/06/2021 1037   NA 138 03/16/2018 1326   K 4.4 03/06/2021 1037   CL 101 03/06/2021 1037   CO2 32 03/06/2021 1037   GLUCOSE 92 03/06/2021 1037   BUN 16 03/06/2021 1037   BUN 15 03/16/2018 1326   CREATININE 1.00 03/06/2021 1037   CREATININE 0.93 05/21/2020 1211   CALCIUM 9.8 03/06/2021 1037   PROT 7.6 03/06/2021 1037   ALBUMIN 4.1 03/06/2021 1037   AST 14 (L) 03/06/2021 1037   AST 24 05/21/2020 1211   ALT 9 03/06/2021 1037   ALT 17 05/21/2020 1211   ALKPHOS 51 03/06/2021 1037   BILITOT 0.6 03/06/2021 1037   BILITOT 0.9 05/21/2020 1211   GFRNONAA 55 (L) 03/06/2021 1037   GFRNONAA >60 05/21/2020 1211   GFRAA >60 11/02/2018 0435     ASSESSMENT and THERAPY PLAN:   Malignant neoplasm of overlapping sites of right breast [facility]) Chrys Racer is an 88 year old woman with stage IIa clinical invasive breast cancer that is estrogen positive.  1.  Stage IIa estrogen positive breast cancer she will continue on tamoxifen.  She has not undergone surgery.  We discussed the fact that she needs repeat mammogram.  She would prefer to do this in 6 months since she does not like mammography.  I have placed orders orders for this and my nurse will fax these over to [facility].  2.  Stage Ia estrogen positive left-sided breast cancer: She continues on tamoxifen daily.  Her most recent mammography did not really show anything specific or mention anything specific of change in the area of concern where the clip is.  We will repeat mammography bilaterally in  6 months with bilateral ultrasounds.  She will see [place] after that  visit.  Follow up instructions:    -Return to cancer center 3 months  -Mammogram due in 3 months   The patient was provided an opportunity to ask questions and all were answered. The patient agreed with the plan and demonstrated an understanding of the instructions.   The patient was advised to call back or seek an in-person evaluation if the symptoms worsen or if the condition fails to improve as anticipated.   I provided 11 minutes of non face-to-face telephone visit time during this encounter, and > 50% was spent counseling as documented under my assessment & plan.  Wilber Bihari, NP 03/16/21 11:12 AM Medical Oncology and Hematology [facility] [place], [place] [address] Tel. [phone]    Fax. [phone]

## 2021-03-16 NOTE — Assessment & Plan Note (Signed)
Julie Mcgee is an 88 year old woman with stage IIa clinical invasive breast cancer that is estrogen positive. ? ?1.  Stage IIa estrogen positive breast cancer she will continue on tamoxifen.  She has not undergone surgery.  We discussed the fact that she needs repeat mammogram.  She would prefer to do this in 6 months since she does not like mammography.  I have placed orders orders for this and my nurse will fax these over to [facility]. ? ?2.  Stage Ia estrogen positive left-sided breast cancer: She continues on tamoxifen daily.  Her most recent mammography did not really show anything specific or mention anything specific of change in the area of concern where the clip is. ? ?We will repeat mammography bilaterally in 6 months with bilateral ultrasounds.  She will see [place] after that visit. ?

## 2021-03-17 ENCOUNTER — Telehealth: Payer: Self-pay | Admitting: Adult Health

## 2021-03-17 NOTE — Telephone Encounter (Signed)
Scheduled appointment per 3/6 los. Called patient but it appears to be the son's [name]) number. Left message with the details of the appointment. ?

## 2021-03-19 ENCOUNTER — Other Ambulatory Visit: Payer: Self-pay

## 2021-03-19 MED ORDER — CHOLESTYRAMINE 4 G PO PACK: 4.0000 g | PACK | Freq: Two times a day (BID) | ORAL | 3 refills | Status: DC

## 2021-03-19 NOTE — Telephone Encounter (Signed)
Spoke with Julie Mcgee at [facility] and she stated medication was sent to their pharmacy so pt will receive medication tonight and pt will start receiving medication BID starting tomorrow.  ?

## 2021-03-19 NOTE — Telephone Encounter (Signed)
Pt's son called and stated that pt continues to have severe diarrhea. Let son know that Dr. Bryan Lemma said we could do Questran BID if pt had no appreciable response. Sent prescription for Questran BID to pt's pharmacy. Reminded pt's son that we still need the stool test. Pt's son stated he would have his wife pick up supplies today. Also Pt's son stated that prescriptions are mailed and wanted to make sure pt gets the second dose tonight. [place] to speak with nurse. Left voicemail for nurse to call back.  ?

## 2021-03-27 ENCOUNTER — Ambulatory Visit (INDEPENDENT_AMBULATORY_CARE_PROVIDER_SITE_OTHER): Payer: Medicare Other

## 2021-03-27 DIAGNOSIS — I469 Cardiac arrest, cause unspecified: Secondary | ICD-10-CM

## 2021-03-27 LAB — CUP PACEART REMOTE DEVICE CHECK
Battery Remaining Longevity: 107 mo
Battery Voltage: 3.01 V
Brady Statistic AP VP Percent: 62.24 %
Brady Statistic AP VS Percent: 0.25 %
Brady Statistic AS VP Percent: 37.41 %
Brady Statistic AS VS Percent: 0.1 %
Brady Statistic RA Percent Paced: 62.48 %
Brady Statistic RV Percent Paced: 99.65 %
Date Time Interrogation Session: 20230317063130
Implantable Lead Implant Date: 20200220
Implantable Lead Implant Date: 20200220
Implantable Lead Location: 753859
Implantable Lead Location: 753860
Implantable Lead Model: 3830
Implantable Lead Model: 5076
Implantable Pulse Generator Implant Date: 20200220
Lead Channel Impedance Value: 323 Ohm
Lead Channel Impedance Value: 323 Ohm
Lead Channel Impedance Value: 399 Ohm
Lead Channel Impedance Value: 494 Ohm
Lead Channel Pacing Threshold Amplitude: 0.625 V
Lead Channel Pacing Threshold Amplitude: 0.875 V
Lead Channel Pacing Threshold Pulse Width: 0.4 ms
Lead Channel Pacing Threshold Pulse Width: 0.4 ms
Lead Channel Sensing Intrinsic Amplitude: 26.625 mV
Lead Channel Sensing Intrinsic Amplitude: 26.625 mV
Lead Channel Sensing Intrinsic Amplitude: 3.75 mV
Lead Channel Sensing Intrinsic Amplitude: 3.75 mV
Lead Channel Setting Pacing Amplitude: 1.5 V
Lead Channel Setting Pacing Amplitude: 2.5 V
Lead Channel Setting Pacing Pulse Width: 0.4 ms
Lead Channel Setting Sensing Sensitivity: 2.8 mV

## 2021-04-02 NOTE — Progress Notes (Signed)
Remote pacemaker transmission.   

## 2021-05-13 ENCOUNTER — Telehealth: Payer: Self-pay

## 2021-05-13 NOTE — Telephone Encounter (Signed)
Per Dr.Feng's request, sent scheduling a message to reschedule pt's Lab and MD visit appts to sometime next week.  ?

## 2021-05-15 ENCOUNTER — Telehealth: Payer: Self-pay | Admitting: Hematology

## 2021-05-15 NOTE — Telephone Encounter (Signed)
.  Called patient to schedule appointment per /4 inbasket, patient is aware of date and time.   ?

## 2021-05-20 ENCOUNTER — Inpatient Hospital Stay: Payer: Medicare Other | Attending: Adult Health

## 2021-05-20 ENCOUNTER — Other Ambulatory Visit: Payer: Medicare Other

## 2021-05-20 ENCOUNTER — Other Ambulatory Visit: Payer: Self-pay

## 2021-05-20 ENCOUNTER — Telehealth: Payer: Self-pay | Admitting: Gastroenterology

## 2021-05-20 ENCOUNTER — Encounter: Payer: Self-pay | Admitting: Hematology

## 2021-05-20 ENCOUNTER — Inpatient Hospital Stay (HOSPITAL_BASED_OUTPATIENT_CLINIC_OR_DEPARTMENT_OTHER): Payer: Medicare Other | Admitting: Hematology

## 2021-05-20 VITALS — BP 135/48 | HR 66 | Temp 98.2°F | Resp 18 | Ht 62.0 in | Wt 195.6 lb

## 2021-05-20 DIAGNOSIS — C50411 Malignant neoplasm of upper-outer quadrant of right female breast: Secondary | ICD-10-CM | POA: Diagnosis present

## 2021-05-20 DIAGNOSIS — Z85038 Personal history of other malignant neoplasm of large intestine: Secondary | ICD-10-CM | POA: Insufficient documentation

## 2021-05-20 DIAGNOSIS — Z87891 Personal history of nicotine dependence: Secondary | ICD-10-CM | POA: Diagnosis not present

## 2021-05-20 DIAGNOSIS — I110 Hypertensive heart disease with heart failure: Secondary | ICD-10-CM | POA: Diagnosis not present

## 2021-05-20 DIAGNOSIS — Z170 Estrogen receptor positive status [ER+]: Secondary | ICD-10-CM | POA: Diagnosis not present

## 2021-05-20 DIAGNOSIS — Z79899 Other long term (current) drug therapy: Secondary | ICD-10-CM | POA: Diagnosis not present

## 2021-05-20 DIAGNOSIS — E039 Hypothyroidism, unspecified: Secondary | ICD-10-CM | POA: Insufficient documentation

## 2021-05-20 DIAGNOSIS — I480 Paroxysmal atrial fibrillation: Secondary | ICD-10-CM | POA: Insufficient documentation

## 2021-05-20 DIAGNOSIS — Z8673 Personal history of transient ischemic attack (TIA), and cerebral infarction without residual deficits: Secondary | ICD-10-CM | POA: Diagnosis not present

## 2021-05-20 DIAGNOSIS — I2510 Atherosclerotic heart disease of native coronary artery without angina pectoris: Secondary | ICD-10-CM | POA: Diagnosis not present

## 2021-05-20 DIAGNOSIS — C50811 Malignant neoplasm of overlapping sites of right female breast: Secondary | ICD-10-CM

## 2021-05-20 DIAGNOSIS — Z79810 Long term (current) use of selective estrogen receptor modulators (SERMs): Secondary | ICD-10-CM | POA: Diagnosis not present

## 2021-05-20 DIAGNOSIS — Z7901 Long term (current) use of anticoagulants: Secondary | ICD-10-CM | POA: Insufficient documentation

## 2021-05-20 DIAGNOSIS — Z952 Presence of prosthetic heart valve: Secondary | ICD-10-CM | POA: Diagnosis not present

## 2021-05-20 DIAGNOSIS — I5032 Chronic diastolic (congestive) heart failure: Secondary | ICD-10-CM | POA: Insufficient documentation

## 2021-05-20 DIAGNOSIS — I739 Peripheral vascular disease, unspecified: Secondary | ICD-10-CM | POA: Diagnosis not present

## 2021-05-20 DIAGNOSIS — C50212 Malignant neoplasm of upper-inner quadrant of left female breast: Secondary | ICD-10-CM

## 2021-05-20 LAB — CBC WITH DIFFERENTIAL/PLATELET
Abs Immature Granulocytes: 0.04 10*3/uL (ref 0.00–0.07)
Basophils Absolute: 0 10*3/uL (ref 0.0–0.1)
Basophils Relative: 0 %
Eosinophils Absolute: 0.1 10*3/uL (ref 0.0–0.5)
Eosinophils Relative: 1 %
HCT: 39.1 % (ref 36.0–46.0)
Hemoglobin: 12.8 g/dL (ref 12.0–15.0)
Immature Granulocytes: 0 %
Lymphocytes Relative: 14 %
Lymphs Abs: 1.5 10*3/uL (ref 0.7–4.0)
MCH: 31.1 pg (ref 26.0–34.0)
MCHC: 32.7 g/dL (ref 30.0–36.0)
MCV: 95.1 fL (ref 80.0–100.0)
Monocytes Absolute: 0.8 10*3/uL (ref 0.1–1.0)
Monocytes Relative: 7 %
Neutro Abs: 8.5 10*3/uL — ABNORMAL HIGH (ref 1.7–7.7)
Neutrophils Relative %: 78 %
Platelets: 216 10*3/uL (ref 150–400)
RBC: 4.11 MIL/uL (ref 3.87–5.11)
RDW: 13.7 % (ref 11.5–15.5)
WBC: 11.1 10*3/uL — ABNORMAL HIGH (ref 4.0–10.5)
nRBC: 0 % (ref 0.0–0.2)

## 2021-05-20 LAB — COMPREHENSIVE METABOLIC PANEL
ALT: 13 U/L (ref 0–44)
AST: 18 U/L (ref 15–41)
Albumin: 3.2 g/dL — ABNORMAL LOW (ref 3.5–5.0)
Alkaline Phosphatase: 39 U/L (ref 38–126)
Anion gap: 5 (ref 5–15)
BUN: 18 mg/dL (ref 8–23)
CO2: 25 mmol/L (ref 22–32)
Calcium: 8.8 mg/dL — ABNORMAL LOW (ref 8.9–10.3)
Chloride: 107 mmol/L (ref 98–111)
Creatinine, Ser: 1 mg/dL (ref 0.44–1.00)
GFR, Estimated: 55 mL/min — ABNORMAL LOW (ref >60)
Glucose, Bld: 113 mg/dL — ABNORMAL HIGH (ref 70–99)
Potassium: 4.8 mmol/L (ref 3.5–5.1)
Sodium: 137 mmol/L (ref 135–145)
Total Bilirubin: 0.6 mg/dL (ref 0.3–1.2)
Total Protein: 6.5 g/dL (ref 6.5–8.1)

## 2021-05-20 NOTE — Telephone Encounter (Signed)
Inbound call from patients son stating that patient is having loose stool and is requesting a call back. Please advise.  ?

## 2021-05-20 NOTE — Telephone Encounter (Signed)
Spoke with pt's son. Pt's son stated that pt's diarrhea has returned and occurs about 2-3 x per week. Pt taking cholestyramine and budesonide. Pt's son also stated that he thought she may have initially gotten a GI bug that was going around where pt lives. Diarrhea improved but has now returned. Pt's son also concerned about changes in bowel habits because of pt's history of colon cancer. Advised pt's son to pick up stool kit from lab to complete GI profile test that was ordered in March. Pt's son verbalized understanding.  ?

## 2021-05-20 NOTE — Progress Notes (Signed)
?[place]   ?Telephone:(336) [phone] [phone]   ?Clinic Follow up Note  ? ?Patient Care Team: ?Edmonia Caprio, NP as PCP - General (Adult Health Nurse Practitioner) ?Revankar, Reita Cliche, MD as PCP - Cardiology (Cardiology) ?Sherren Mocha, MD as PCP - Structural Heart (Cardiology) ?Evans Lance, MD as PCP - Electrophysiology (Cardiology) ?Rosalin Hawking, MD as Consulting Physician (Neurology) ?Mauro Kaufmann, RN as Oncology Nurse Navigator ?Rockwell Germany, RN as Oncology Nurse Navigator ?Coralie Keens, MD as Consulting Physician (General Surgery) ?Truitt Merle, MD as Consulting Physician (Hematology) ?Kyung Rudd, MD as Consulting Physician (Radiation Oncology) ? ?Date of Service:  05/20/2021 ? ?CHIEF COMPLAINT: f/u of bilateral breast cancer ? ?CURRENT THERAPY:  ?Tamoxifen, started late 05/2020 ? ?ASSESSMENT & PLAN:  ?Julie Mcgee is a 88 y.o. post-menopausal female with  ? ?1. Right breast cancer, Stage IIA, c(T2N1M0), ER+/PR+/HER2-, Grade II-III and ?Left breast cancer, Stage IA, c(T2N0M0), ER+/PR+/HER2-, Grade II-III  ?-She has had palpable right breast mass for about 5 years, even when she lived in [place]. This was seen on CT scan at time of colon cancer diagnosis in 2020, but patient declined work up at that time. ?-With new left breast nipple inversion, she proceeded with breast work up which showed larger right breast mass, extensive right axillary adnopathy, and a small left breast mass. Her 05/14/20 breast biopsy showed Invasive ductal carcinoma in right and left breast and positive right axillary LNs with grade 2-3 disease. ?-PET scan 05/29/20 showed: 2.9 cm mass in inferolateral right breast; numerous right axillary nodal metastases, additional high paratracheal nodal metastases; 2.4 cm mass in inferior left breast with mild hypermetabolism; no evidence of metastatic disease in abdomen/pelvis. ?-Because of the mediastinal lymphadenopathy, she is not a surgical  candidate. She started tamoxifen, due to her osteoporosis (T score -2.4 with high risk hip fracture), on 06/02/20.  She tolerated very well. ?-most recent right MM and [place] on 05/11/21 at [facility] showed: decrease in size of mass at 1 o'clock (now 1.2 cm by [place]); two new masses at 8:30 (0.5 cm) and 9 (0.9 cm); similar size of primary mass at 12 o'clock (currently 3 cm by [place]) although less prominent in echotexture.  I discussed biopsy of the new lesions, she declined. ?-She is clinically doing well on tamoxifen, no pain or other symptoms.  Lab reviewed.  Exam showed no palpable breast masses or adenopathy.  Will continue monitoring. ?-I discussed repeating PET scan in 3 months, she is agreeable. ?-Lab and follow-up in 3 months ?  ?2. Recent H/o early Stage colon cancer, pT1N0M0 ?-diagnosed in 05/2018, s/p colectomy. No additional treatment indicated. ?-She will continue cancer surveillance. ?  ?3. Commordities: H/o CVA in 2011, CAD, s/p TAVR in 02/2018, CHF, HTN, Hypothyroidism, Afib, Peripheral vascular disease. ?-She has multiple comorbidities, managed by her other physicians ?-Her 02/2019 Echo showed EF improved to 65-70%. ?-She is on Eliquis since Stroke. She had significant bleeding s/p breast biopsy. ?-She has residual right hand weakness, and her ambulation has decreased over the years. She has had frequent falls and currently ambulates with walker. ?-05/2020 DEXA showed T-score of -2.4 with high risk for hip fracture.  ?  ?4. Social support ?-She lives with her elderly husband (42s) in independently living. His mobility is lower than hers. They plan to move to assisted living soon. ?-She has good support from her son and his wife who are her primary care givers. They wish to be heavily involved in her care. 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  ?  ?  ?PLAN:  ?-continue tamoxifen ?-I discussed her recent mammogram and ultrasound, she declined biopsy ?-Follow-up in 3 months with lab and PET scan a few  days before ? ? ?No problem-specific Assessment & Plan notes found for this encounter. ? ? ?SUMMARY OF ONCOLOGIC HISTORY: ?Oncology History Overview Note  ?Cancer Staging ?Malignant neoplasm of ascending colon ([place]) ?Staging form: Colon and Rectum, AJCC 8th Edition ?- Pathologic stage from 05/24/2018: Stage I (pT1, pN0, cM0) - Signed by Tish Men, MD on 06/07/2018 ?Total positive nodes: 0 ? ?Malignant neoplasm of overlapping sites of right breast [facility]) ?Staging form: Breast, AJCC 8th Edition ?- Clinical stage from 05/21/2020: Stage IIA (cT2, cN1(f), cM0, G2, ER+, PR+, HER2-) - Unsigned ?Stage prefix: Initial diagnosis ?Method of lymph node assessment: Core biopsy ?Histologic grading system: 3 grade system ? ?Malignant neoplasm of upper-inner quadrant of left breast in female, estrogen receptor positive ([place]) ?Staging form: Breast, AJCC 8th Edition ?- Clinical stage from 05/21/2020: Stage IA (cT1a, cN0, cM0, G2, ER+, PR+, HER2-) - Unsigned ?Stage prefix: Initial diagnosis ?Method of lymph node assessment: Clinical ?Histologic grading system: 3 grade system ? ?  ?Malignant neoplasm of ascending colon ([place])  ? Initial Diagnosis  ? Malignant neoplasm of ascending colon ([place]) ? ?  ?05/19/2018 Procedure  ? Colonoscopy: ?- Likely malignant tumor in the ascending colon. Biopsied. Tattooed. This is the source of ?the anemia. ?- A few 2 to 5 mm polyps in the rectum, in the sigmoid colon and in the ascending colon. ?- Non-bleeding internal hemorrhoids. ?- The examined portion of the ileum was normal. ? ?  ?05/19/2018 Pathology Results  ? 1. Duodenum, Biopsy ?- BENIGN SMALL BOWEL MUCOSA. ?- NO VILLOUS BLUNTING OR INCREASE IN INTRAEPITHELIAL LYMPHOCYTES. ?- NO DYSPLASIA OR MALIGNANCY. ?2. Stomach, polyp(s) ?- FUNDIC GLAND POLYP. ?- WARTHIN-STARRY IS NEGATIVE FOR HELICOBACTER PYLORI. ?- NO INTESTINAL METAPLASIA, DYSPLASIA, OR MALIGNANCY. ?3. Colon, biopsy, ascending ?- INVASIVE ADENOCARCINOMA. ? ?  ?05/20/2018 Imaging  ? CT  CAP: ?IMPRESSION: ?1. No CT findings of the abdomen or pelvis to localize suspected GI ?bleeding. No intraluminal contrast noted. ?  ?2. Focal soft tissue thickening of the colon wall superior to the ?ileocecal valve may reflect reported ascending colon mass. ?  ?3. There is extensive fatty mural stratification of the cecum, ?suggesting chronic inflammatory sequelae of colitis, including ?Crohn's disease. ?  ?4. Moderate right, small left pleural effusions and associated ?atelectasis or consolidation. There is mild, diffuse interlobular ?septal thickening, most consistent with edema. ?  ?5.  Interval placement of aortic valve stent endograft. ?  ?6. There is a lobulated 2.6 cm mass in the central right breast ?(series 3, image 39), as seen on prior examination. Correlate with ?mammography. ?  ?7.  Other chronic and incidental findings as detailed above. ? ?  ?05/24/2018 Pathology Results  ? Accession: [id number] ? ?Procedure: Right colon and ileum resection. ?Tumor Site: Proximal right colon. ?Tumor Size: 2.5 cm. ?Macroscopic Tumor Perforation: Not identified. ?Histologic Type: Invasive adenocarcinoma. ?Histologic Grade: G2, moderately differentiated. ?Tumor Extension: Into submucosa. ?Margins: Negative. ?Treatment Effect: N/A. ?Lymphovascular Invasion: N/A. ?Perineural Invasion: N/A. ?Tumor Deposits: Not identified. ?Regional Lymph Nodes: ?Number of Lymph Nodes Involved: 0 ?Number of Lymph Nodes Examined: 18 ?Pathologic Stage Classification (pTNM, AJCC 8th Edition): pT1, pN0 ?Ancillary Studies: MSI will be ordered. MMR was performed on the biopsy and showed loss of MLH1 and PMS2. ? ?MSI-high, BRAF [id number] mutation present  ? ?  ?  05/24/2018 Cancer Staging  ? Staging form: Colon and Rectum, AJCC 8th Edition ?- Pathologic stage from 05/24/2018: Stage I (pT1, pN0, cM0) - Signed by Tish Men, MD on 06/07/2018 ? ?  ?Malignant neoplasm of upper-inner quadrant of left breast in female, estrogen receptor positive ([place])   ?04/28/2020 Mammogram  ? Mammogram 04/28/20  ?The 3.2cm irregular mass in the right breast at 12:00 position middle depth, 5cmfn, is highly suggesitve of malignancy. An US guided biopsy is recommended.  ? ?the 0.6cm mass in the left

## 2021-05-22 ENCOUNTER — Other Ambulatory Visit: Payer: Medicare Other

## 2021-05-22 ENCOUNTER — Ambulatory Visit: Payer: Medicare Other | Admitting: Hematology

## 2021-05-22 ENCOUNTER — Other Ambulatory Visit: Payer: Self-pay

## 2021-05-22 DIAGNOSIS — R197 Diarrhea, unspecified: Secondary | ICD-10-CM

## 2021-05-22 NOTE — Telephone Encounter (Signed)
Sounds very reasonable to suspect infectious etiology based on his helpful information. Will f/u on test results. Thanks.  ? ?

## 2021-05-22 NOTE — Telephone Encounter (Signed)
Unclear if recent symptomatology is due to GI infection (seems reasonable given potential exposure) vs return of her Lymphocytic Colitis.  At the time of the last appointment, we had started budesonide.  Does he think the symptoms started to breakthrough at a certain dose of the budesonide taper?  Continue taking cholestyramine and will await GI PCR panel results.  Please also send C. difficile PCR lab (separate from the one that is part of the GI panel). ?

## 2021-05-22 NOTE — Telephone Encounter (Signed)
Spoke with pt's son and let him know recommendations. Pt's son was unsure if symptoms began with budesonide taper but said that he would ask his mom what she thought. Pt's son stated he really thought it started with the GI bug that was going around facility at the time because he stated it got so bad people had to quarantine in their rooms. Order placed for C. Diff PCR lab.  ?

## 2021-05-25 LAB — GI PROFILE, STOOL, PCR

## 2021-05-26 ENCOUNTER — Other Ambulatory Visit: Payer: Medicare Other

## 2021-05-26 DIAGNOSIS — R197 Diarrhea, unspecified: Secondary | ICD-10-CM

## 2021-05-27 ENCOUNTER — Other Ambulatory Visit: Payer: Self-pay

## 2021-05-27 DIAGNOSIS — R197 Diarrhea, unspecified: Secondary | ICD-10-CM

## 2021-05-28 LAB — CLOSTRIDIUM DIFFICILE BY PCR: Toxigenic C. Difficile by PCR: NEGATIVE

## 2021-05-29 ENCOUNTER — Other Ambulatory Visit: Payer: Medicare Other

## 2021-06-03 ENCOUNTER — Other Ambulatory Visit: Payer: Medicare Other

## 2021-06-03 DIAGNOSIS — R197 Diarrhea, unspecified: Secondary | ICD-10-CM

## 2021-06-04 ENCOUNTER — Ambulatory Visit (INDEPENDENT_AMBULATORY_CARE_PROVIDER_SITE_OTHER): Payer: Medicare Other | Admitting: Cardiology

## 2021-06-04 ENCOUNTER — Encounter: Payer: Self-pay | Admitting: Cardiology

## 2021-06-04 VITALS — BP 130/76 | HR 63 | Ht 62.0 in | Wt 195.0 lb

## 2021-06-04 DIAGNOSIS — I10 Essential (primary) hypertension: Secondary | ICD-10-CM

## 2021-06-04 DIAGNOSIS — I050 Rheumatic mitral stenosis: Secondary | ICD-10-CM

## 2021-06-04 DIAGNOSIS — I6523 Occlusion and stenosis of bilateral carotid arteries: Secondary | ICD-10-CM

## 2021-06-04 DIAGNOSIS — E7800 Pure hypercholesterolemia, unspecified: Secondary | ICD-10-CM

## 2021-06-04 DIAGNOSIS — I350 Nonrheumatic aortic (valve) stenosis: Secondary | ICD-10-CM | POA: Diagnosis not present

## 2021-06-04 DIAGNOSIS — I442 Atrioventricular block, complete: Secondary | ICD-10-CM

## 2021-06-04 DIAGNOSIS — I5032 Chronic diastolic (congestive) heart failure: Secondary | ICD-10-CM

## 2021-06-04 DIAGNOSIS — I480 Paroxysmal atrial fibrillation: Secondary | ICD-10-CM | POA: Diagnosis not present

## 2021-06-04 LAB — LIPID PANEL
Chol/HDL Ratio: 1.7 ratio (ref 0.0–4.4)
Cholesterol, Total: 100 mg/dL (ref 100–199)
HDL: 59 mg/dL (ref >39)
LDL Chol Calc (NIH): 25 mg/dL (ref 0–99)
Triglycerides: 75 mg/dL (ref 0–149)
VLDL Cholesterol Cal: 16 mg/dL (ref 5–40)

## 2021-06-04 LAB — ALT: ALT: 9 IU/L (ref 0–32)

## 2021-06-04 NOTE — Addendum Note (Signed)
Addended by: Antonieta Iba on: 06/04/2021 11:48 AM   Modules accepted: Orders

## 2021-06-04 NOTE — Progress Notes (Addendum)
Date:  06/04/2021   ID:  Julie Mcgee, DOB [birth year], MRN [id number] The patient was identified using 2 identifiers. PCP:  Edmonia Caprio, NP   [place]  Cardiologist:  Fransico Him, MD  Advanced Practice Provider:  No care team member to display Electrophysiologist:  Cristopher Peru, MD [phone]   Evaluation Performed:  Follow-Up Visit  Chief Complaint: AS, PAF, HTN, carotid stenosis  History of Present Illness:    Julie Mcgee is a 88 y.o. female with a hx of  severe AS s/p 54m Edwards Sapien 3 THV TAVR 02/2018, RBBB, carotid artery stenosis, Hypertension, hyperlipidemia, PAF noted on Pacer check, complete heart block s/p PPM, chronic diastolic CHF.  She is followed in device clinic for her PPM.   Unfortunately she was diagnosed with colon cancer in her ascending colon after stenting with a GI bleed.  She had been on DAPT for her recent TAVR and that was held.  She had hemicolectomy on 05/24/2018 for invasive adeno CA.  Prior to discharge her hemoglobin was stable at 7.1 and she was placed back on DAPT therapy.  Prior to discharge she was also noted to have a mass in her right breast by CT that was suspicious for cancer and this is going to be worked up as an outpatient.   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  They reviewed her staging scans which showed a 2.6 cm lobulated mass in the right central breast with enlarged mediastinal and hilar lymph nodes.  It was felt that this was likely a primary breast cancer and not metastasis.    She is here today for followup and is doing well.  She denies any chest pain or pressure, SOB, DOE, PND, orthopnea,  dizziness, palpitations or syncope. She has chronic LE edema from sitting a lot.  She does take diuretics for this. She is compliant with her meds and is tolerating meds with no SE.      Past Medical History:   Diagnosis Date   Arthritis    Carotid artery stenosis    Chronic diastolic CHF (congestive heart failure) (HCC)    Colon cancer (HCC)    Family history of skin cancer    Former smoker    1/2 ppd for 45 years   History of CVA (cerebrovascular accident) 2011   with residual R hemiparesis   Hyperlipidemia    Hypertension    Hypothyroidism    PAF (paroxysmal atrial fibrillation) ([facility]    Peripheral vascular disease (HCC)    RBBB    S/P TAVR (transcatheter aortic valve replacement) 02/28/2018   23 mm Edwards Sapien 3 transcatheter heart valve placed via percutaneous right transfemoral approach    Severe aortic stenosis    2D echo 02/2019 with severe AS and moderate to severe AI   Past Surgical History:  Procedure Laterality Date   BASAL CELL CARCINOMA EXCISION  2018   BIOPSY  05/19/2018   Procedure: BIOPSY;  Surgeon: CLavena Bullion DO;  [facility];  Service: Gastroenterology;;   CARDIAC CATHETERIZATION     COLONOSCOPY  2001   COLONOSCOPY WITH PROPOFOL N/A 05/19/2018   Procedure: COLONOSCOPY WITH PROPOFOL;  Surgeon: CLavena Bullion DO;  [facility]: [facility];  Service: Gastroenterology;  Laterality: N/A;   ESOPHAGOGASTRODUODENOSCOPY (EGD) WITH PROPOFOL N/A 05/19/2018   Procedure: ESOPHAGOGASTRODUODENOSCOPY (EGD) WITH PROPOFOL;  Surgeon: CLavena Bullion  DO;  Location: [place];  Service: Gastroenterology;  Laterality: N/A;   HIP SURGERY  2005   KNEE SURGERY  1998   LAPAROSCOPIC PARTIAL COLECTOMY N/A 05/24/2018   Procedure: LAPAROSCOPIC ASSISTED ASCENDING HEMICOLECTOMY;  Surgeon: Ileana Roup, MD;  Location: [place];  Service: General;  Laterality: N/A;   PACEMAKER IMPLANT N/A 03/02/2018   Procedure: PACEMAKER IMPLANT;  Surgeon: Evans Lance, MD;  Location: [place] [facility];  Service: Cardiovascular;  Laterality: N/A;   RIGHT/LEFT HEART CATH AND CORONARY ANGIOGRAPHY N/A 02/21/2018   Procedure: RIGHT/LEFT HEART CATH AND CORONARY ANGIOGRAPHY;  Surgeon:  Sherren Mocha, MD;  Location: [place] [facility];  Service: Cardiovascular;  Laterality: N/A;   SUBMUCOSAL TATTOO INJECTION  05/19/2018   Procedure: SUBMUCOSAL TATTOO INJECTION;  Surgeon: Lavena Bullion, DO;  Location: [place];  Service: Gastroenterology;;   TEE WITHOUT CARDIOVERSION N/A 02/28/2018   Procedure: TRANSESOPHAGEAL ECHOCARDIOGRAM (TEE);  Surgeon: Sherren Mocha, MD;  Location: [place] [facility];  Service: Open Heart Surgery;  Laterality: N/A;   TEMPORARY PACEMAKER N/A 03/01/2018   Procedure: TEMPORARY PACEMAKER;  Surgeon: Burnell Blanks, MD;  Location: [place] [facility];  Service: Cardiovascular;  Laterality: N/A;   TONSILLECTOMY     TRANSCATHETER AORTIC VALVE REPLACEMENT, TRANSFEMORAL N/A 02/28/2018   Procedure: TRANSCATHETER AORTIC VALVE REPLACEMENT, TRANSFEMORAL;  Surgeon: Sherren Mocha, MD;  Location: [place] [facility];  Service: Open Heart Surgery;  Laterality: N/A;     Current Meds  Medication Sig   apixaban (ELIQUIS) 5 MG TABS tablet Take 1 tablet (5 mg total) by mouth 2 (two) times daily.   Budesonide ER 9 MG CP24 Take 9 mg by mouth daily at 6 (six) AM for 60 days, THEN 6 mg daily at 6 (six) AM for 60 days, THEN 3 mg daily at 6 (six) AM.   Calcium Carb-Cholecalciferol (CALTRATE 600+D3 PO) Take 1 tablet by mouth 2 (two) times daily.   cholestyramine (QUESTRAN) 4 g packet Take 1 packet (4 g total) by mouth 2 (two) times daily. Take other medications at least 1 hour before or 4 to 6 hours after cholestyramine   furosemide (LASIX) 20 MG tablet Take 1 tablet (20 mg total) by mouth daily. Take 2 tablets (40 mg) twice daily for three days, then reduce to 1 tablet (20 mg total) once daily.   levothyroxine (SYNTHROID) 100 MCG tablet Take 100 mcg by mouth daily before breakfast.    metoprolol tartrate (LOPRESSOR) 50 MG tablet Take 50 mg by mouth 2 (two) times daily.   Multiple Vitamins-Minerals (MULTIVITAMIN ADULTS 50+) TABS Take 1 tablet by mouth daily.    Probiotic Product (PROBIOTIC DAILY PO) Take 1 tablet by mouth daily.   simvastatin (ZOCOR) 40 MG tablet Take 40 mg by mouth daily.   tamoxifen (NOLVADEX) 20 MG tablet Take 1 tablet (20 mg total) by mouth daily.     Allergies:   Adhesive [tape]   Social History   Tobacco Use   Smoking status: Former    Packs/day: 1.00    Years: 45.00    Pack years: 45.00    Types: Cigarettes    Quit date: 05/23/2009    Years since quitting: 12.0   Smokeless tobacco: Never  Vaping Use   Vaping Use: Never used  Substance Use Topics   Alcohol use: Not Currently   Drug use: Never     Family Hx: The patient's family history includes Alcohol abuse in her father; Chronic Renal Failure in her mother; Heart failure  in her mother; Skin cancer (age of onset: 40) in her sister; Skin cancer (age of onset: 36) in her sister. There is no history of Colon cancer, Esophageal cancer, Pancreatic cancer, or Stomach cancer.  ROS:   Please see the history of present illness.     All other systems reviewed and are negative.   Prior CV studies:   The following studies were reviewed today:  2D echo 03/08/2019 IMPRESSIONS     1. 23 mm Edwards S3. V max 3.2 m/s, mean gradient 24 mmHG, EOA 1.20 cm2,  DI 0.32. Suspect an element of PPM (EOAi 0.67 m2/cm2) as gradients were  high day after implant. Jet contour is not rounded to suspect obstruction.  No regurgitation or paravalvular  leak. Gradients stable compared with 09/28/2018. The aortic valve has been  repaired/replaced. Aortic valve regurgitation is not visualized. There is  a 23 mm Edwards Sapien prosthetic (TAVR) valve present in the aortic  position. Procedure Date: 02/28/18.   2. Severe calcific mitral stenosis. MVA by VTI 1.15 cm2. PASP severely  elevated. The mitral valve is degenerative. Moderate to severe mitral  valve regurgitation. Severe mitral stenosis. The mean mitral valve  gradient is 11.0 mmHg with average heart rate   of 60 bpm.   3. Left  ventricular ejection fraction, by estimation, is 65 to 70%. The  left ventricle has normal function. The left ventricle has no regional  wall motion abnormalities. There is moderate concentric left ventricular  hypertrophy. Left ventricular  diastolic function could not be evaluated. Left ventricular diastolic  function could not be evaluated.   4. Right ventricular systolic function is normal. The right ventricular  size is mildly enlarged. There is severely elevated pulmonary artery  systolic pressure. The estimated right ventricular systolic pressure is  41.3 mmHg.   5. Left atrial size was severely dilated.   6. Right atrial size was moderately dilated.   7. Tricuspid valve regurgitation is moderate to severe.   8. The inferior vena cava is dilated in size with <50% respiratory  variability, suggesting right atrial pressure of 15 mmHg.   Comparison(s): A prior study was performed on 09/28/2018. No significant  change from prior study. Prior images reviewed side by side. TAVR  gradients stable. Severe calcific mitral stenosis still present.   Labs/Other Tests and Data Reviewed:    EKG:  NSR with RBBB and LPFB  Recent Labs: 11/04/2020: B Natriuretic Peptide 625.0 05/20/2021: ALT 13; BUN 18; Creatinine, Ser 1.00; Hemoglobin 12.8; Platelets 216; Potassium 4.8; Sodium 137   Recent Lipid Panel No results found for: CHOL, TRIG, HDL, CHOLHDL, LDLCALC, LDLDIRECT  Wt Readings from Last 3 Encounters:  06/04/21 195 lb (88.5 kg)  05/20/21 195 lb 9.6 oz (88.7 kg)  11/06/20 210 lb (95.3 kg)     Risk Assessment/Calculations:    Objective:    Vital Signs:  BP 130/76   Pulse 63   Ht '5\' 2"'  (1.575 m)   Wt 195 lb (88.5 kg) Comment: per pt/family report as pt unable to stand  SpO2 97%   BMI 35.67 kg/m   GEN: Well nourished, well developed in no acute distress HEENT: Normal NECK: No JVD; No carotid bruits LYMPHATICS: No lymphadenopathy CARDIAC:RRR, no murmurs, rubs,  gallops RESPIRATORY:  Clear to auscultation without rales, wheezing or rhonchi  ABDOMEN: Soft, non-tender, non-distended MUSCULOSKELETAL:  no edema; No deformity  SKIN: Warm and dry NEUROLOGIC:  Alert and oriented x 3 PSYCHIATRIC:  Normal affect   ASSESSMENT & PLAN:  1.  Severe AS  -s/p 17m Edwards Sapien 3 THV TAVR 02/2018.   -No ASA since she is on DOAC -Follow-up echo 02/2019 showed normal LV function with a stable TAVR with  slightly increased mean aortic valve gradient 24 mmHg    2.  Hypertension  -BP is adequate controlled on exam today  -Continue prescription drug therapy with Lopressor 50 mg twice daily with as needed refills    3.  Hyperlipidemia  -her LDL goal is less than 70 due to carotid stenosis.  -Continue prescription management with simvastatin 40 mg daily with as needed refills -Check FLP and ALT  4.  Bilateral carotid artery stenosis  -Carotid Dopplers 02/21/2018 showed 1 to 39% stenosis of the carotid arteries bilaterally.   -Continue statin therapy -no ASA due to DOAC -Repeat Dopplers   5.  Chronic diastolic CHF  -lungs are clear -she has chronic LE edema related to chronic venous insuff and sitting a lot but no pitting edema on exam today -continue prescription drug management with Lasix 262mdaily  -encouraged her to use her compression hose during the day     6.  Complete heart block -s/p PPM  -followed in device clinic   7.  Severe Rheumatic mitral stenosis -noted on echo 02/2019 -She has not had any shortness of breath or lower extremity edema -not a surgical or interventional candidate for structural heart team -echo 02/2019 showed severe mitral stenosis and moderate to severe mitral regurgitation -I encouraged her and her son to let me know if she develops any SOB, LE edema or recurrent syncope   8.  PAF -noted on pacer interrogation -Denies any palpitations and maintains normal sinus rhythm on exam today -Denies any bleeding problems on  DOAC -Continue prescription drug management with Eliquis 5 mg twice daily and Lopressor 50 mg twice daily with as needed refills -I have personally reviewed and interpreted outside labs performed by patient's PCP which showed serum creatinine 1, potassium 4.8 and hemoglobin 12.8 on 05/20/2021    Medication Adjustments/Labs and Tests Ordered: Current medicines are reviewed at length with the patient today.  Concerns regarding medicines are outlined above.   Tests Ordered: Orders Placed This Encounter  Procedures   EKG 12-Lead    Medication Changes: No orders of the defined types were placed in this encounter.   Follow Up:  In Person in 6 month(s)  Signed, TrFransico HimMD  06/04/2021 11:36 AM    [place]

## 2021-06-04 NOTE — Patient Instructions (Signed)
Medication Instructions:  Your physician recommends that you continue on your current medications as directed. Please refer to the Current Medication list given to you today.  *If you need a refill on your cardiac medications before your next appointment, please call your pharmacy*   Lab Work: TODAY: Fasting lipid and ALT If you have labs (blood work) drawn today and your tests are completely normal, you will receive your results only by: [place] (if you have MyChart) OR A paper copy in the mail If you have any lab test that is abnormal or we need to change your treatment, we will call you to review the results.  Testing/Procedures: Your physician has requested that you have an echocardiogram. Echocardiography is a painless test that uses sound waves to create images of your heart. It provides your doctor with information about the size and shape of your heart and how well your heart's chambers and valves are working. This procedure takes approximately one hour. There are no restrictions for this procedure.  Your physician has requested that you have a carotid duplex. This test is an ultrasound of the carotid arteries in your neck. It looks at blood flow through these arteries that supply the brain with blood. Allow one hour for this exam. There are no restrictions or special instructions.  Follow-Up: At [facility], you and your health needs are our priority.  As part of our continuing mission to provide you with exceptional heart care, we have created designated Provider Care Teams.  These Care Teams include your primary Cardiologist (physician) and Advanced Practice Providers (APPs -  Physician Assistants and Nurse Practitioners) who all work together to provide you with the care you need, when you need it.  Your next appointment:   6 month(s)  The format for your next appointment:   In Person  Provider:   Fransico Him, MD     Important Information About [place]

## 2021-06-05 LAB — GI PROFILE, STOOL, PCR

## 2021-06-09 ENCOUNTER — Other Ambulatory Visit: Payer: Self-pay | Admitting: Gastroenterology

## 2021-06-09 ENCOUNTER — Encounter: Payer: Self-pay | Admitting: Gastroenterology

## 2021-06-09 ENCOUNTER — Ambulatory Visit: Payer: Medicare Other | Admitting: Hematology

## 2021-06-09 ENCOUNTER — Other Ambulatory Visit: Payer: Medicare Other

## 2021-06-09 ENCOUNTER — Encounter: Payer: Self-pay | Admitting: Cardiology

## 2021-06-09 NOTE — Telephone Encounter (Signed)
Agree, based on the description this would be lower likely related to her previous right hemicolectomy and loss of ileocecal valve.  She does also have a history of lymphocytic colitis, but that should be persistent watery stools/diarrhea rather than episodic incontinence.

## 2021-06-09 NOTE — Telephone Encounter (Signed)
Spoke with pt's son and let him know Dr. Fenton Malling message about results. Pt's son stated that pt is currently having 1 normal formed bowel movement everyday or every other day and will have an episode where she is incontinent of stool about once a week and will have liquid diarrhea. Pt is concerned this could mean she has cancer again. Pt's son wanted to know if this would be expected since she had part of her colon removed.

## 2021-06-09 NOTE — Telephone Encounter (Signed)
Spoke with pt's son and let him know Dr. Vivia Ewing message. Pt's son verbalized understanding and had no other concerns at end of call.

## 2021-06-24 ENCOUNTER — Other Ambulatory Visit: Payer: Self-pay | Admitting: Cardiology

## 2021-06-24 DIAGNOSIS — I6523 Occlusion and stenosis of bilateral carotid arteries: Secondary | ICD-10-CM

## 2021-06-25 ENCOUNTER — Encounter (HOSPITAL_COMMUNITY): Payer: Self-pay | Admitting: Cardiology

## 2021-06-25 ENCOUNTER — Ambulatory Visit (HOSPITAL_COMMUNITY): Payer: Medicare Other | Attending: Internal Medicine

## 2021-06-26 ENCOUNTER — Ambulatory Visit (INDEPENDENT_AMBULATORY_CARE_PROVIDER_SITE_OTHER): Payer: Medicare Other

## 2021-06-26 DIAGNOSIS — I442 Atrioventricular block, complete: Secondary | ICD-10-CM | POA: Diagnosis not present

## 2021-06-29 LAB — CUP PACEART REMOTE DEVICE CHECK
Battery Remaining Longevity: 102 mo
Battery Voltage: 3.01 V
Brady Statistic AP VP Percent: 16.26 %
Brady Statistic AP VS Percent: 4.97 %
Brady Statistic AS VP Percent: 70.57 %
Brady Statistic AS VS Percent: 8.21 %
Brady Statistic RA Percent Paced: 21.29 %
Brady Statistic RV Percent Paced: 86.83 %
Date Time Interrogation Session: 20230616062337
Implantable Lead Implant Date: 20200220
Implantable Lead Implant Date: 20200220
Implantable Lead Location: 753859
Implantable Lead Location: 753860
Implantable Lead Model: 3830
Implantable Lead Model: 5076
Implantable Pulse Generator Implant Date: 20200220
Lead Channel Impedance Value: 304 Ohm
Lead Channel Impedance Value: 304 Ohm
Lead Channel Impedance Value: 380 Ohm
Lead Channel Impedance Value: 456 Ohm
Lead Channel Pacing Threshold Amplitude: 0.625 V
Lead Channel Pacing Threshold Amplitude: 1 V
Lead Channel Pacing Threshold Pulse Width: 0.4 ms
Lead Channel Pacing Threshold Pulse Width: 0.4 ms
Lead Channel Sensing Intrinsic Amplitude: 2.25 mV
Lead Channel Sensing Intrinsic Amplitude: 2.25 mV
Lead Channel Sensing Intrinsic Amplitude: 31.625 mV
Lead Channel Sensing Intrinsic Amplitude: 31.625 mV
Lead Channel Setting Pacing Amplitude: 1.5 V
Lead Channel Setting Pacing Amplitude: 2.5 V
Lead Channel Setting Pacing Pulse Width: 0.4 ms
Lead Channel Setting Sensing Sensitivity: 2.8 mV

## 2021-07-01 ENCOUNTER — Ambulatory Visit (HOSPITAL_COMMUNITY)
Admission: RE | Admit: 2021-07-01 | Discharge: 2021-07-01 | Disposition: A | Discharge status: Home / Self Care | Payer: Medicare Other | Source: Ambulatory Visit | Attending: Cardiovascular Disease | Admitting: Cardiovascular Disease

## 2021-07-01 ENCOUNTER — Encounter: Payer: Self-pay | Admitting: Cardiology

## 2021-07-01 DIAGNOSIS — I6523 Occlusion and stenosis of bilateral carotid arteries: Secondary | ICD-10-CM | POA: Diagnosis not present

## 2021-07-13 ENCOUNTER — Other Ambulatory Visit (HOSPITAL_COMMUNITY): Payer: Medicare Other

## 2021-07-24 ENCOUNTER — Ambulatory Visit (HOSPITAL_COMMUNITY): Payer: Medicare Other

## 2021-07-28 ENCOUNTER — Encounter: Payer: Self-pay | Admitting: Cardiology

## 2021-08-13 ENCOUNTER — Ambulatory Visit (HOSPITAL_COMMUNITY): Payer: Medicare Other | Attending: Cardiology

## 2021-08-13 DIAGNOSIS — I442 Atrioventricular block, complete: Secondary | ICD-10-CM

## 2021-08-13 DIAGNOSIS — I050 Rheumatic mitral stenosis: Secondary | ICD-10-CM

## 2021-08-13 DIAGNOSIS — I10 Essential (primary) hypertension: Secondary | ICD-10-CM | POA: Insufficient documentation

## 2021-08-13 DIAGNOSIS — I480 Paroxysmal atrial fibrillation: Secondary | ICD-10-CM | POA: Insufficient documentation

## 2021-08-13 DIAGNOSIS — I5032 Chronic diastolic (congestive) heart failure: Secondary | ICD-10-CM | POA: Diagnosis present

## 2021-08-13 DIAGNOSIS — E7800 Pure hypercholesterolemia, unspecified: Secondary | ICD-10-CM

## 2021-08-13 DIAGNOSIS — I6523 Occlusion and stenosis of bilateral carotid arteries: Secondary | ICD-10-CM | POA: Diagnosis present

## 2021-08-13 DIAGNOSIS — I350 Nonrheumatic aortic (valve) stenosis: Secondary | ICD-10-CM | POA: Diagnosis present

## 2021-08-13 LAB — ECHOCARDIOGRAM COMPLETE
AR max vel: 0.97 cm2
AV Area VTI: 1.15 cm2
AV Area mean vel: 0.95 cm2
AV Mean grad: 22 mmHg
AV Peak grad: 38.2 mmHg
Ao pk vel: 3.09 m/s
Area-P 1/2: 1.66 cm2
MV M vel: 6.63 m/s
MV Peak grad: 175.8 mmHg
MV VTI: 0.75 cm2
Radius: 0.7 cm
S' Lateral: 1.4 cm

## 2021-08-19 ENCOUNTER — Encounter (HOSPITAL_COMMUNITY)
Admission: RE | Admit: 2021-08-19 | Discharge: 2021-08-19 | Disposition: A | Discharge status: Home / Self Care | Payer: Medicare Other | Source: Ambulatory Visit | Attending: Hematology | Admitting: Hematology

## 2021-08-19 VITALS — Wt 181.0 lb

## 2021-08-19 DIAGNOSIS — Z170 Estrogen receptor positive status [ER+]: Secondary | ICD-10-CM | POA: Diagnosis present

## 2021-08-19 DIAGNOSIS — C50411 Malignant neoplasm of upper-outer quadrant of right female breast: Secondary | ICD-10-CM | POA: Diagnosis present

## 2021-08-19 DIAGNOSIS — C50811 Malignant neoplasm of overlapping sites of right female breast: Secondary | ICD-10-CM | POA: Insufficient documentation

## 2021-08-19 LAB — GLUCOSE, CAPILLARY: Glucose-Capillary: 100 mg/dL — ABNORMAL HIGH (ref 70–99)

## 2021-08-19 MED ORDER — FLUDEOXYGLUCOSE F - 18 (FDG) INJECTION
10.5000 | Freq: Once | INTRAVENOUS | Status: AC
  Administered 2021-08-19: 9 via INTRAVENOUS

## 2021-08-26 ENCOUNTER — Encounter: Payer: Self-pay | Admitting: Hematology

## 2021-08-26 ENCOUNTER — Inpatient Hospital Stay: Payer: Medicare Other | Attending: Hematology | Admitting: Hematology

## 2021-08-26 DIAGNOSIS — Z79810 Long term (current) use of selective estrogen receptor modulators (SERMs): Secondary | ICD-10-CM | POA: Insufficient documentation

## 2021-08-26 DIAGNOSIS — C50212 Malignant neoplasm of upper-inner quadrant of left female breast: Secondary | ICD-10-CM | POA: Diagnosis not present

## 2021-08-26 DIAGNOSIS — C50812 Malignant neoplasm of overlapping sites of left female breast: Secondary | ICD-10-CM | POA: Insufficient documentation

## 2021-08-26 DIAGNOSIS — Z87891 Personal history of nicotine dependence: Secondary | ICD-10-CM | POA: Insufficient documentation

## 2021-08-26 DIAGNOSIS — Z85038 Personal history of other malignant neoplasm of large intestine: Secondary | ICD-10-CM | POA: Insufficient documentation

## 2021-08-26 DIAGNOSIS — Z170 Estrogen receptor positive status [ER+]: Secondary | ICD-10-CM | POA: Insufficient documentation

## 2021-08-26 DIAGNOSIS — Z79899 Other long term (current) drug therapy: Secondary | ICD-10-CM | POA: Insufficient documentation

## 2021-08-26 DIAGNOSIS — Z7901 Long term (current) use of anticoagulants: Secondary | ICD-10-CM | POA: Insufficient documentation

## 2021-08-26 NOTE — Progress Notes (Signed)
Julie Mcgee   Telephone:([phone]) [phone] Fax:(336) [phone]   Clinic Follow up Note   Patient Care Team: Edmonia Caprio, NP as PCP - General (Adult Health Nurse Practitioner) Sherren Mocha, MD as PCP - Structural Heart (Cardiology) Evans Lance, MD as PCP - Electrophysiology (Cardiology) Sueanne Margarita, MD as PCP - Cardiology (Cardiology) Rosalin Hawking, MD as Consulting Physician (Neurology) Mauro Kaufmann, RN as Oncology Nurse Navigator Rockwell Germany, RN as Oncology Nurse Navigator Coralie Keens, MD as Consulting Physician (General Surgery) Truitt Merle, MD as Consulting Physician (Hematology) Kyung Rudd, MD as Consulting Physician (Radiation Oncology)  Date of Service:  08/26/2021  I connected with Julie Mcgee on 08/26/2021 at  2:40 PM EDT by video enabled telemedicine visit and verified that I am speaking with the correct person using two identifiers.  I discussed the limitations, risks, security and privacy concerns of performing an evaluation and management service by telephone and the availability of in person appointments. I also discussed with the patient that there may be a patient responsible charge related to this service. The patient expressed understanding and agreed to proceed.   Other persons participating in the visit and their role in the encounter:  pt's son  Patient's location:  home Provider's location:  my office  CHIEF COMPLAINT: f/u of bilateral breast cancer  CURRENT THERAPY:  Tamoxifen, started late 05/2020  ASSESSMENT & PLAN:  Julie Mcgee is a 88 y.o. female with   1. Right breast cancer, Stage IIA, c(T2N1M0), ER+/PR+/HER2-, Grade II-III and Left breast cancer, Stage IA, c(T2N0M0), ER+/PR+/HER2-, Grade II-III  -She has had palpable right breast mass for about 5 years, even when she lived in [place]. This was seen on CT scan at time of colon cancer diagnosis in 2020, but patient declined work up at that time. -With new  left breast nipple inversion, she proceeded with breast work up which showed larger right breast mass, extensive right axillary adnopathy, and a small left breast mass. Her 05/14/20 breast biopsy showed Invasive ductal carcinoma in right and left breast and positive right axillary LNs with grade 2-3 disease. -PET scan 05/29/20 showed: 2.9 cm mass in inferolateral right breast; numerous right axillary nodal metastases, additional high paratracheal nodal metastases; 2.4 cm mass in inferior left breast with mild hypermetabolism; no evidence of metastatic disease in abdomen/pelvis. -Because of the mediastinal lymphadenopathy, she is not a surgical candidate. She started tamoxifen, due to her osteoporosis (T score -2.4 with high risk hip fracture), on 06/02/20.  She tolerated very well. -surveillance PET scan on 08/19/21 showed: response to therapy in right breast and axilla; resolution of left breast hypermetabolism and thoracic inlet nodes; residual low-level, non-specific hypermetabolism within mediastinal nodes; new focus of hypermetabolism in right infrahilar without CT correlate. I reviewed her images and discussed the results with them today. -She is clinically doing well on tamoxifen, no pain or other symptoms. Will continue  -Lab and follow-up in 3 months   2. H/o early Stage colon cancer, pT1N0M0 -diagnosed in 05/2018, s/p colectomy. No additional treatment indicated. -She will continue cancer surveillance.   3. Commordities: H/o CVA in 2011, CAD, s/p TAVR in 02/2018, CHF, HTN, Hypothyroidism, Afib, Peripheral vascular disease. -She has multiple comorbidities, managed by her other physicians -Her 02/2019 Echo showed EF improved to 65-70%. -She is on Eliquis since Stroke. She had significant bleeding s/p breast biopsy. -She has residual right hand weakness, and her ambulation has decreased over the years. She has had frequent falls and  currently ambulates with walker. -05/2020 DEXA showed T-score of -2.4  with high risk for hip fracture.    4. Social support -She lives with her elderly husband (35s) in independently living. His mobility is lower than hers. They plan to move to assisted living soon. -She has good support from her son and his wife who are her primary care givers. They wish to be heavily involved in her care. 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      PLAN:  -PET scan revieed, PR -continue tamoxifen -lab and f/u in 3 months   No problem-specific Assessment & Plan notes found for this encounter.   SUMMARY OF ONCOLOGIC HISTORY: Oncology History Overview Note  Cancer Staging Malignant neoplasm of ascending colon [facility]) Staging form: Colon and Rectum, AJCC 8th Edition - Pathologic stage from 05/24/2018: Stage I (pT1, pN0, cM0) - Signed by Tish Men, MD on 06/07/2018 Total positive nodes: 0  Malignant neoplasm of overlapping sites of right breast [facility]) Staging form: Breast, AJCC 8th Edition - Clinical stage from 05/21/2020: Stage IIA (cT2, cN1(f), cM0, G2, ER+, PR+, HER2-) - Unsigned Stage prefix: Initial diagnosis Method of lymph node assessment: Core biopsy Histologic grading system: 3 grade system  Malignant neoplasm of upper-inner quadrant of left breast in female, estrogen receptor positive ([place]) Staging form: Breast, AJCC 8th Edition - Clinical stage from 05/21/2020: Stage IA (cT1a, cN0, cM0, G2, ER+, PR+, HER2-) - Unsigned Stage prefix: Initial diagnosis Method of lymph node assessment: Clinical Histologic grading system: 3 grade system    Malignant neoplasm of ascending colon [facility])   Initial Diagnosis   Malignant neoplasm of ascending colon ([place])   05/19/2018 Procedure   Colonoscopy: - Likely malignant tumor in the ascending colon. Biopsied. Tattooed. This is the source of the anemia. - A few 2 to 5 mm polyps in the rectum, in the sigmoid colon and in the ascending colon. - Non-bleeding internal hemorrhoids. - The examined portion of the  ileum was normal.   05/19/2018 Pathology Results   1. Duodenum, Biopsy - BENIGN SMALL BOWEL MUCOSA. - NO VILLOUS BLUNTING OR INCREASE IN INTRAEPITHELIAL LYMPHOCYTES. - NO DYSPLASIA OR MALIGNANCY. 2. Stomach, polyp(s) - FUNDIC GLAND POLYP. - WARTHIN-STARRY IS NEGATIVE FOR HELICOBACTER PYLORI. - NO INTESTINAL METAPLASIA, DYSPLASIA, OR MALIGNANCY. 3. Colon, biopsy, ascending - INVASIVE ADENOCARCINOMA.   05/20/2018 Imaging   CT CAP: IMPRESSION: 1. No CT findings of the abdomen or pelvis to localize suspected GI bleeding. No intraluminal contrast noted.   2. Focal soft tissue thickening of the colon wall superior to the ileocecal valve may reflect reported ascending colon mass.   3. There is extensive fatty mural stratification of the cecum, suggesting chronic inflammatory sequelae of colitis, including Crohn's disease.   4. Moderate right, small left pleural effusions and associated atelectasis or consolidation. There is mild, diffuse interlobular septal thickening, most consistent with edema.   5.  Interval placement of aortic valve stent endograft.   6. There is a lobulated 2.6 cm mass in the central right breast (series 3, image 39), as seen on prior examination. Correlate with mammography.   7.  Other chronic and incidental findings as detailed above.   05/24/2018 Pathology Results   Accession: [id number]  Procedure: Right colon and ileum resection. Tumor Site: Proximal right colon. Tumor Size: 2.5 cm. Macroscopic Tumor Perforation: Not identified. Histologic Type: Invasive adenocarcinoma. Histologic Grade: G2, moderately differentiated. Tumor Extension: Into submucosa. Margins: Negative. Treatment Effect: N/A. Lymphovascular Invasion: N/A. Perineural Invasion: N/A. Tumor  Deposits: Not identified. Regional Lymph Nodes: Number of Lymph Nodes Involved: 0 Number of Lymph Nodes Examined: 18 Pathologic Stage Classification (pTNM, AJCC 8th Edition): pT1, pN0 Ancillary  Studies: MSI will be ordered. MMR was performed on the biopsy and showed loss of MLH1 and PMS2.  MSI-high, BRAF [id number] mutation present    05/24/2018 Cancer Staging   Staging form: Colon and Rectum, AJCC 8th Edition - Pathologic stage from 05/24/2018: Stage I (pT1, pN0, cM0) - Signed by Tish Men, MD on 06/07/2018   Malignant neoplasm of upper-inner quadrant of left breast in female, estrogen receptor positive ([place])  04/28/2020 Mammogram   Mammogram 04/28/20  The 3.2cm irregular mass in the right breast at 12:00 position middle depth, 5cmfn, is highly suggesitve of malignancy. An US guided biopsy is recommended.   the 0.6cm mass in the left breast upper inner aspect posterior depth, 11cmfn is indetermineate. There also are fine branching linear calcifications spanning 1.4cm in teh left breast at 6:00 position middle depth 8.5cmfn. An [place] is reocmmended.      05/14/2020 Initial Biopsy   1.Breast, Left, needle core biopsy, 10:00 position, middle depth, 8cmfn -INVASIVE DUCTAL CARCINOMA 2. breast, right, needle core biopsy, 12:00 position, middle depth, 8cmfn -INVASIVE DUCTAL CARCINOMA 3. Lymph node, biopsy, axillary -METASTATIC CARCINOMA INVOLVING NODAL TISSUE 4. Breast, left needle core biopsy, 6:00 position, 7.3cmfn -FOCAL ATYPICAL DUCTAL HYPERPLASIA WITH CALCIFICATIONS.   1. Grade 2-3 measures 0.4cm  2. Grade 2-3 and measurers 1cm    05/16/2020 Initial Diagnosis   Malignant neoplasm of upper-inner quadrant of left breast in female, estrogen receptor positive ([place])   Malignant neoplasm of upper-outer quadrant of right breast in female, estrogen receptor positive ([place])  04/28/2020 Mammogram   Mammogram 04/28/20  The 3.2cm irregular mass in the right breast at 12:00 position middle depth, 5cmfn, is highly suggesitve of malignancy. An US guided biopsy is recommended.   the 0.6cm mass in the left breast upper inner aspect posterior depth, 11cmfn is indetermineate. There also are fine branching  linear calcifications spanning 1.4cm in teh left breast at 6:00 position middle depth 8.5cmfn. An [place] is reocmmended.      05/14/2020 Initial Biopsy   1.Breast, Left, needle core biopsy, 10:00 position, middle depth, 8cmfn -INVASIVE DUCTAL CARCINOMA 2. breast, right, needle core biopsy, 12:00 position, middle depth, 8cmfn -INVASIVE DUCTAL CARCINOMA 3. Lymph node, biopsy, axillary -METASTATIC CARCINOMA INVOLVING NODAL TISSUE 4. Breast, left needle core biopsy, 6:00 position, 7.3cmfn -FOCAL ATYPICAL DUCTAL HYPERPLASIA WITH CALCIFICATIONS.   1. Grade 2-3 measures 0.4cm  2. Grade 2-3 and measurers 1cm    05/21/2020 Initial Diagnosis   Malignant neoplasm of upper-outer quadrant of right breast in female, estrogen receptor positive ([place])      INTERVAL HISTORY:  Julie Mcgee was contacted for a follow up of breast cancer. She was last seen by me on 05/20/21. She reports she is doing "very, very good." She denies any new concerns.    All other systems were reviewed with the patient and are negative.  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   23 mm Edwards Sapien 3 transcatheter heart valve placed via percutaneous right transfemoral approach    Severe aortic stenosis    2D echo 02/2019 with severe AS and moderate to severe AI    SURGICAL HISTORY: Past Surgical History:  Procedure Laterality Date   BASAL CELL CARCINOMA EXCISION  2018   BIOPSY   05/19/2018   Procedure: BIOPSY;  Surgeon: Lavena Bullion, DO;  Location: [place] [facility];  Service: Gastroenterology;;   CARDIAC CATHETERIZATION     COLONOSCOPY  2001   COLONOSCOPY WITH PROPOFOL N/A 05/19/2018   Procedure: COLONOSCOPY WITH PROPOFOL;  Surgeon: Lavena Bullion, DO;  Location: [place];  Service: Gastroenterology;  Laterality: N/A;   ESOPHAGOGASTRODUODENOSCOPY (EGD) WITH PROPOFOL N/A 05/19/2018   Procedure: ESOPHAGOGASTRODUODENOSCOPY (EGD) WITH PROPOFOL;  Surgeon: Lavena Bullion, DO;  Location: [place];  Service: Gastroenterology;  Laterality: N/A;   HIP SURGERY  2005   KNEE SURGERY  1998   LAPAROSCOPIC PARTIAL COLECTOMY N/A 05/24/2018   Procedure: LAPAROSCOPIC ASSISTED ASCENDING HEMICOLECTOMY;  Surgeon: Ileana Roup, MD;  Location: [place];  Service: General;  Laterality: N/A;   PACEMAKER IMPLANT N/A 03/02/2018   Procedure: PACEMAKER IMPLANT;  Surgeon: Evans Lance, MD;  Location: [place] [facility];  Service: Cardiovascular;  Laterality: N/A;   RIGHT/LEFT HEART CATH AND CORONARY ANGIOGRAPHY N/A 02/21/2018   Procedure: RIGHT/LEFT HEART CATH AND CORONARY ANGIOGRAPHY;  Surgeon: Sherren Mocha, MD;  Location: [place] [facility];  Service: Cardiovascular;  Laterality: N/A;   SUBMUCOSAL TATTOO INJECTION  05/19/2018   Procedure: SUBMUCOSAL TATTOO INJECTION;  Surgeon: Lavena Bullion, DO;  Location: [place];  Service: Gastroenterology;;   TEE WITHOUT CARDIOVERSION N/A 02/28/2018   Procedure: TRANSESOPHAGEAL ECHOCARDIOGRAM (TEE);  Surgeon: Sherren Mocha, MD;  Location: [place] [facility];  Service: Open Heart Surgery;  Laterality: N/A;   TEMPORARY PACEMAKER N/A 03/01/2018   Procedure: TEMPORARY PACEMAKER;  Surgeon: Burnell Blanks, MD;  Location: [place] [facility];  Service: Cardiovascular;  Laterality: N/A;   TONSILLECTOMY     TRANSCATHETER AORTIC VALVE REPLACEMENT, TRANSFEMORAL N/A 02/28/2018   Procedure: TRANSCATHETER AORTIC VALVE REPLACEMENT,  TRANSFEMORAL;  Surgeon: Sherren Mocha, MD;  Location: [place] [facility];  Service: Open Heart Surgery;  Laterality: N/A;    I have reviewed the social history and family history with the patient and they are unchanged from previous note.  ALLERGIES:  is allergic to adhesive [tape].  MEDICATIONS:  Current Outpatient Medications  Medication Sig Dispense Refill   apixaban (ELIQUIS) 5 MG TABS tablet Take 1 tablet (5 mg total) by mouth 2 (two) times daily. 180 tablet 1   Calcium Carb-Cholecalciferol (CALTRATE 600+D3 PO) Take 1 tablet by mouth 2 (two) times daily.     cholestyramine (QUESTRAN) 4 g packet TAKE 1 PACKET (4 G TOTAL) BY MOUTH 2 (TWO) TIMES DAILY. TAKE OTHER MEDICATIONS AT LEAST 1 HOUR BEFORE OR 4 TO 6 HOURS AFTER CHOLESTYRAMINE 60 packet 3   famotidine (PEPCID) 40 MG tablet Take 1 tablet (40 mg total) by mouth daily. 30 tablet 5   furosemide (LASIX) 20 MG tablet Take 1 tablet (20 mg total) by mouth daily. Take 2 tablets (40 mg) twice daily for three days, then reduce to 1 tablet (20 mg total) once daily. 90 tablet 3   levothyroxine (  SYNTHROID) 100 MCG tablet Take 100 mcg by mouth daily before breakfast.      metoprolol tartrate (LOPRESSOR) 50 MG tablet Take 50 mg by mouth 2 (two) times daily.     Multiple Vitamins-Minerals (MULTIVITAMIN ADULTS 50+) TABS Take 1 tablet by mouth daily.     Probiotic Product (PROBIOTIC DAILY PO) Take 1 tablet by mouth daily.     simvastatin (ZOCOR) 40 MG tablet Take 40 mg by mouth daily.     tamoxifen (NOLVADEX) 20 MG tablet Take 1 tablet (20 mg total) by mouth daily. 30 tablet 3   No current facility-administered medications for this visit.    PHYSICAL EXAMINATION: ECOG PERFORMANCE STATUS: 1 - Symptomatic but completely ambulatory  There were no vitals filed for this visit. Wt Readings from Last 3 Encounters:  08/19/21 181 lb (82.1 kg)  06/04/21 195 lb (88.5 kg)  05/20/21 195 lb 9.6 oz (88.7 kg)     No vitals taken today, Exam not performed  today  LABORATORY DATA:  I have reviewed the data as listed    Latest Ref Rng & Units 05/20/2021    3:07 PM 03/06/2021   10:37 AM 01/30/2021    3:03 PM  CBC  WBC 4.0 - 10.5 K/uL 11.1  9.2  7.1   Hemoglobin 12.0 - 15.0 g/dL 12.8  14.1  13.3   Hematocrit 36.0 - 46.0 % 39.1  42.7  41.1   Platelets 150 - 400 K/uL 216  250  169         Latest Ref Rng & Units 06/04/2021   11:57 AM 05/20/2021    3:07 PM 03/06/2021   10:37 AM  CMP  Glucose 70 - 99 mg/dL  113  92   BUN 8 - 23 mg/dL  18  16   Creatinine 0.44 - 1.00 mg/dL  1.00  1.00   Sodium 135 - 145 mmol/L  137  137   Potassium 3.5 - 5.1 mmol/L  4.8  4.4   Chloride 98 - 111 mmol/L  107  101   CO2 22 - 32 mmol/L  25  32   Calcium 8.9 - 10.3 mg/dL  8.8  9.8   Total Protein 6.5 - 8.1 g/dL  6.5  7.6   Total Bilirubin 0.3 - 1.2 mg/dL  0.6  0.6   Alkaline Phos 38 - 126 U/L  39  51   AST 15 - 41 U/L  18  14   ALT 0 - 32 IU/L '9  13  9       ' RADIOGRAPHIC STUDIES: I have personally reviewed the radiological images as listed and agreed with the findings in the report. No results found.    No orders of the defined types were placed in this encounter.  All questions were answered. The patient knows to call the clinic with any problems, questions or concerns. No barriers to learning was detected. The total time spent in the appointment was 22 minutes.     Truitt Merle, MD 08/26/2021   I, Wilburn Mylar, am acting as scribe for Truitt Merle, MD.   I have reviewed the above documentation for accuracy and completeness, and I agree with the above.

## 2021-08-27 ENCOUNTER — Telehealth: Payer: Self-pay | Admitting: Hematology

## 2021-08-27 NOTE — Telephone Encounter (Signed)
Left message with follow-up appointment per 8/16 los.

## 2021-09-16 IMAGING — DX DG CHEST 1V PORT
1 series · 1 of 1 positions shown · non-contrast
Comparison: 03/03/2018

CLINICAL DATA: Pt having weakness, dizziness for 2 weeks, syncope
yesterday - hx of CHF, htn, CVA, TAVR, exsmoker, RBBBcough, weakness

EXAM:
PORTABLE CHEST 1 VIEW

[chest ap]
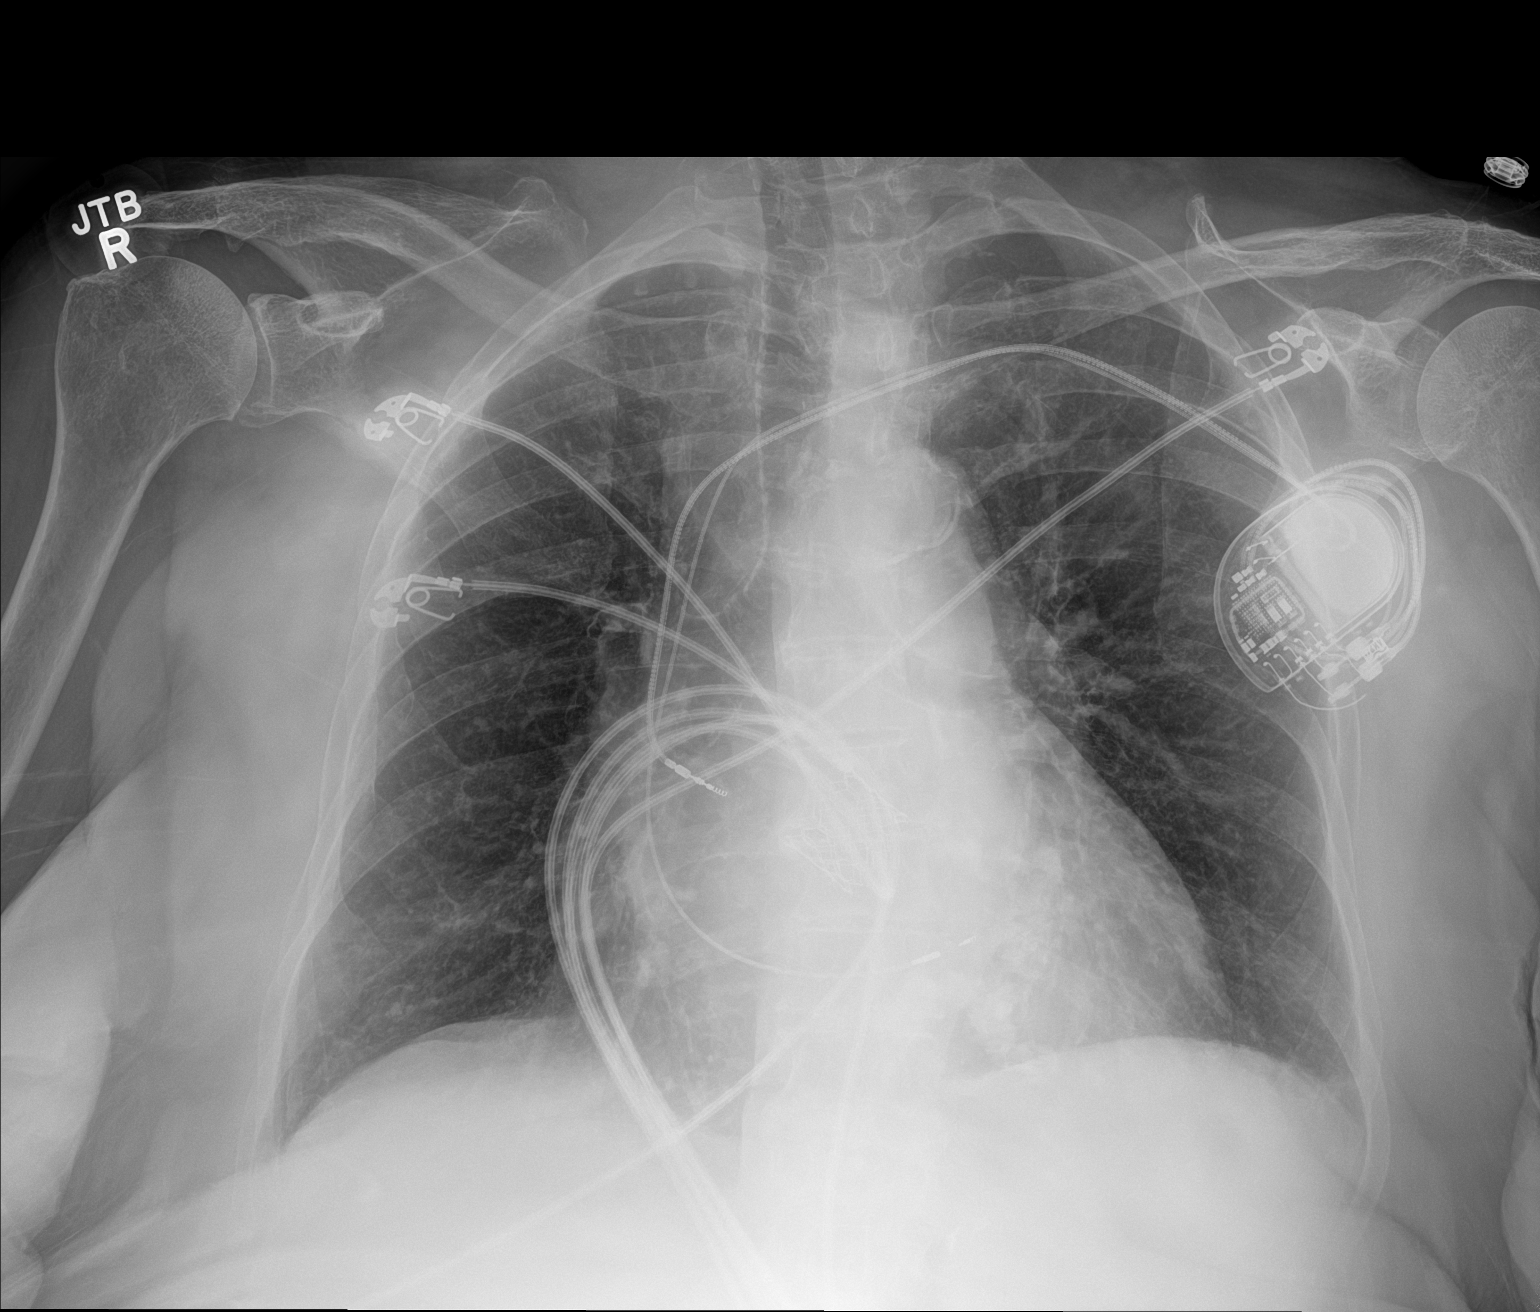

[1 of 1 positions shown; findings below may reference images not displayed]

FINDINGS: LEFT-sided pacemaker overlies stable cardiac silhouette. Lungs are
mildly hyperinflated. No effusion, infiltrate pneumothorax. No acute
osseous abnormality.
IMPRESSION: No acute cardiopulmonary process.

## 2021-09-25 ENCOUNTER — Ambulatory Visit (INDEPENDENT_AMBULATORY_CARE_PROVIDER_SITE_OTHER): Payer: Medicare Other

## 2021-09-25 DIAGNOSIS — I639 Cerebral infarction, unspecified: Secondary | ICD-10-CM

## 2021-09-26 LAB — CUP PACEART REMOTE DEVICE CHECK
Battery Remaining Longevity: 99 mo
Battery Voltage: 3 V
Brady Statistic AP VP Percent: 34 %
Brady Statistic AP VS Percent: 6.98 %
Brady Statistic AS VP Percent: 55.11 %
Brady Statistic AS VS Percent: 3.9 %
Brady Statistic RA Percent Paced: 40.93 %
Brady Statistic RV Percent Paced: 89.12 %
Date Time Interrogation Session: 20230915052708
Implantable Lead Implant Date: 20200220
Implantable Lead Implant Date: 20200220
Implantable Lead Location: 753859
Implantable Lead Location: 753860
Implantable Lead Model: 3830
Implantable Lead Model: 5076
Implantable Pulse Generator Implant Date: 20200220
Lead Channel Impedance Value: 304 Ohm
Lead Channel Impedance Value: 304 Ohm
Lead Channel Impedance Value: 380 Ohm
Lead Channel Impedance Value: 456 Ohm
Lead Channel Pacing Threshold Amplitude: 0.625 V
Lead Channel Pacing Threshold Amplitude: 1 V
Lead Channel Pacing Threshold Pulse Width: 0.4 ms
Lead Channel Pacing Threshold Pulse Width: 0.4 ms
Lead Channel Sensing Intrinsic Amplitude: 24.125 mV
Lead Channel Sensing Intrinsic Amplitude: 24.125 mV
Lead Channel Sensing Intrinsic Amplitude: 3.75 mV
Lead Channel Sensing Intrinsic Amplitude: 3.75 mV
Lead Channel Setting Pacing Amplitude: 1.5 V
Lead Channel Setting Pacing Amplitude: 2.5 V
Lead Channel Setting Pacing Pulse Width: 0.4 ms
Lead Channel Setting Sensing Sensitivity: 2.8 mV

## 2021-10-04 ENCOUNTER — Emergency Department (HOSPITAL_COMMUNITY)
Admission: EM | Admit: 2021-10-04 | Discharge: 2021-10-04 | Disposition: A | Discharge status: Home / Self Care | Payer: Medicare Other | Attending: Emergency Medicine | Admitting: Emergency Medicine

## 2021-10-04 ENCOUNTER — Emergency Department (HOSPITAL_COMMUNITY): Payer: Medicare Other

## 2021-10-04 DIAGNOSIS — S0990XA Unspecified injury of head, initial encounter: Secondary | ICD-10-CM | POA: Insufficient documentation

## 2021-10-04 DIAGNOSIS — I110 Hypertensive heart disease with heart failure: Secondary | ICD-10-CM | POA: Diagnosis not present

## 2021-10-04 DIAGNOSIS — I5032 Chronic diastolic (congestive) heart failure: Secondary | ICD-10-CM | POA: Insufficient documentation

## 2021-10-04 DIAGNOSIS — Z853 Personal history of malignant neoplasm of breast: Secondary | ICD-10-CM | POA: Insufficient documentation

## 2021-10-04 DIAGNOSIS — Z7901 Long term (current) use of anticoagulants: Secondary | ICD-10-CM | POA: Diagnosis not present

## 2021-10-04 DIAGNOSIS — Z79899 Other long term (current) drug therapy: Secondary | ICD-10-CM | POA: Diagnosis not present

## 2021-10-04 DIAGNOSIS — W01198A Fall on same level from slipping, tripping and stumbling with subsequent striking against other object, initial encounter: Secondary | ICD-10-CM | POA: Diagnosis not present

## 2021-10-04 DIAGNOSIS — Z85038 Personal history of other malignant neoplasm of large intestine: Secondary | ICD-10-CM | POA: Insufficient documentation

## 2021-10-04 DIAGNOSIS — M546 Pain in thoracic spine: Secondary | ICD-10-CM | POA: Insufficient documentation

## 2021-10-04 DIAGNOSIS — W19XXXA Unspecified fall, initial encounter: Secondary | ICD-10-CM

## 2021-10-04 DIAGNOSIS — M25552 Pain in left hip: Secondary | ICD-10-CM | POA: Insufficient documentation

## 2021-10-04 DIAGNOSIS — M549 Dorsalgia, unspecified: Secondary | ICD-10-CM

## 2021-10-04 NOTE — Discharge Instructions (Signed)
You were seen today after a fall.  Your imaging was reassuring for no signs of new fracture or head injury.  There was a mass noted in the right upper back region which has increased in size up from 2020.  Please discuss this with your primary care provider or oncologist for further recommendations.  Return to the emergency department if you develop any life-threatening symptoms

## 2021-10-04 NOTE — ED Provider Notes (Signed)
[facility] Provider Note   CSN: [id number] Arrival date & time: 10/04/21  1050     History  Chief Complaint  Patient presents with   fall on thinners   Level 2 trauma    Julie Mcgee is a 88 y.o. female.  Patient presents to the hospital via EMS as a level 2 trauma due to a fall on anticoagulation.  Patient reports upper back pain at this time.  Patient states that she remembers falling, landing on her bottom, subsequently hitting the back of her head against the wall.  She denies losing consciousness.  The patient does take Eliquis due to a previous CVA.  Staff at the facility reports that patient is at her baseline at this time..  The patient currently complains of no headache or neck pain.  She does endorse upper thoracic region pain after a fall.  Patient denying any other pain at this time, shortness of breath, headache, chest pain, lightheadedness, urinary symptoms.  Patient with past medical history significant for CVA, paroxysmal A-fib,.  Chronic diastolic CHF, colon cancer, breast cancer, arthritis, hypertension  HPI     Home Medications Prior to Admission medications   Medication Sig Start Date End Date Taking? Authorizing Provider  apixaban (ELIQUIS) 5 MG TABS tablet Take 1 tablet (5 mg total) by mouth 2 (two) times daily. 03/14/20   Sueanne Margarita, MD  Calcium Carb-Cholecalciferol (CALTRATE 600+D3 PO) Take 1 tablet by mouth 2 (two) times daily.    [provider]  cholestyramine (QUESTRAN) 4 g packet TAKE 1 PACKET (4 G TOTAL) BY MOUTH 2 (TWO) TIMES DAILY. TAKE OTHER MEDICATIONS AT LEAST 1 HOUR BEFORE OR 4 TO 6 HOURS AFTER CHOLESTYRAMINE 06/09/21   Cirigliano, [name] V, DO  famotidine (PEPCID) 40 MG tablet Take 1 tablet (40 mg total) by mouth daily. 02/19/21 03/21/21  Cirigliano, [name] V, DO  furosemide (LASIX) 20 MG tablet Take 1 tablet (20 mg total) by mouth daily. Take 2 tablets (40 mg) twice daily for three days, then reduce to 1  tablet (20 mg total) once daily. 11/05/20   Sueanne Margarita, MD  levothyroxine (SYNTHROID) 100 MCG tablet Take 100 mcg by mouth daily before breakfast.     [provider]  metoprolol tartrate (LOPRESSOR) 50 MG tablet Take 50 mg by mouth 2 (two) times daily.    [provider]  Multiple Vitamins-Minerals (MULTIVITAMIN ADULTS 50+) TABS Take 1 tablet by mouth daily.    [provider]  Probiotic Product (PROBIOTIC DAILY PO) Take 1 tablet by mouth daily.    [provider]  simvastatin (ZOCOR) 40 MG tablet Take 40 mg by mouth daily.    [provider]  tamoxifen (NOLVADEX) 20 MG tablet Take 1 tablet (20 mg total) by mouth daily. 06/02/20   Truitt Merle, MD      Allergies    Adhesive [tape]    Review of Systems   Review of Systems  Musculoskeletal:  Positive for back pain.    Physical Exam Updated Vital Signs BP (!) 155/75   Pulse (!) 58   Temp 98.2 F (36.8 C) (Oral)   Resp 16   Ht '5\' 2"'$  (1.575 m)   Wt 81.6 kg   SpO2 100%   BMI 32.92 kg/m  Physical Exam Vitals and nursing note reviewed.  Constitutional:      General: She is not in acute distress.    Appearance: She is well-developed.  HENT:     Head:  Normocephalic and atraumatic.     Comments: No injury noted to posterior head. No tenderness.  Eyes:     Conjunctiva/sclera: Conjunctivae normal.  Neck:     Comments: No midline cervical spine tenderness.  Cardiovascular:     Rate and Rhythm: Normal rate and regular rhythm.     Heart sounds: No murmur heard. Pulmonary:     Effort: Pulmonary effort is normal. No respiratory distress.     Breath sounds: Normal breath sounds.  Abdominal:     Palpations: Abdomen is soft.     Tenderness: There is no abdominal tenderness.  Musculoskeletal:        General: Tenderness present. No swelling.     Cervical back: Normal range of motion and neck supple.     Comments: Mild tenderness to palpation of the midline upper thoracic spine. Normal ROM  upper and lower extremities bilaterally. No pain with palpation of legs, hips/pelvis, chest, arms, shoulders.   Skin:    General: Skin is warm and dry.     Capillary Refill: Capillary refill takes less than 2 seconds.  Neurological:     Mental Status: She is alert. Mental status is at baseline.  Psychiatric:        Mood and Affect: Mood normal.     ED Results / Procedures / Treatments   Labs (all labs ordered are listed, but only abnormal results are displayed) Labs Reviewed - No data to display  EKG EKG Interpretation  Date/Time:  Sunday October 04 2021 11:23:57 EDT Ventricular Rate:  60 PR Interval:  193 QRS Duration: 135 QT Interval:  516 QTC Calculation: 516 R Axis:   5 Text Interpretation: Sinus rhythm Left bundle branch block Confirmed by Blanchie Dessert [phone]) on 10/04/2021 11:55:53 AM  Radiology DG Hip Unilat W or Wo Pelvis 2-3 Views Left  Result Date: 10/04/2021 CLINICAL DATA:  Left hip pain after fall EXAM: DG HIP (WITH OR WITHOUT PELVIS) 2-3V LEFT COMPARISON:  05/17/2017 FINDINGS: Postsurgical changes from previous left total hip arthroplasty. Arthroplasty components are in their expected alignment. No periprosthetic lucency or fracture is seen. Degenerative changes of the right hip. No pelvic diastasis. Bones appear demineralized. Atherosclerotic vascular calcifications are present. IMPRESSION: Postsurgical changes from previous left total hip arthroplasty without evidence for hardware failure or loosening. No fracture or dislocation. Electronically Signed   By: Davina Poke D.O.   On: 10/04/2021 15:11   CT Thoracic Spine Wo Contrast  Result Date: 10/04/2021 CLINICAL DATA:  Fall. Back pain. EXAM: CT THORACIC SPINE WITHOUT CONTRAST TECHNIQUE: Multidetector CT images of the thoracic were obtained using the standard protocol without intravenous contrast. RADIATION DOSE REDUCTION: This exam was performed according to the departmental dose-optimization program which  includes automated exposure control, adjustment of the mA and/or kV according to patient size and/or use of iterative reconstruction technique. COMPARISON:  CT of the chest and abdomen 05/20/2018. PET scan 08/19/2021 FINDINGS: Alignment: No significant listhesis is present. Focal rightward curvature of the thoracic spine centered at T5-6 is stable. Mild leftward curvature is present in the lower thoracic spine. Vertebrae: Remote compression fractures T12 and L1 are stable. Multilevel endplate degenerative changes are present. Hemangioma is present at T7. Vertebral body heights are maintained. No acute fractures are present. Paraspinal and other soft tissues: Lipoma to the right of the upper thoracic spine is stable. Atherosclerotic changes are present in the aorta and branch vessels without aneurysm. Heart is enlarged. Aortic valve replacement noted. Coronary artery disease present. Visualized abdomen is otherwise unremarkable.  Imaging of the lungs is degraded by patient motion. No focal airspace disease or mass lesion is present. Disc levels: No significant central stenosis is present. Right foraminal narrowing is present at T2-3 and to lesser extent T9-10. No significant left foraminal narrowing is present. IMPRESSION: 1. Stable remote compression fractures at T12 and L1. 2. No acute fracture or traumatic subluxation. 3. Stable scoliosis. 4. Right foraminal narrowing at T2-3 and to lesser extent T9-10. 5. Aortic valve replacement. 6. Aortic Atherosclerosis (ICD10-I70.0). Electronically Signed   By: San Morelle M.D.   On: 10/04/2021 12:57   CT Head Wo Contrast  Result Date: 10/04/2021 CLINICAL DATA:  Head trauma, minor (Age >= 65y); Neck trauma (Age >= 65y) EXAM: CT HEAD WITHOUT CONTRAST CT CERVICAL SPINE WITHOUT CONTRAST TECHNIQUE: Multidetector CT imaging of the head and cervical spine was performed following the standard protocol without intravenous contrast. Multiplanar CT image reconstructions of  the cervical spine were also generated. RADIATION DOSE REDUCTION: This exam was performed according to the departmental dose-optimization program which includes automated exposure control, adjustment of the mA and/or kV according to patient size and/or use of iterative reconstruction technique. COMPARISON:  01/30/2021, 10/31/2018, 08/19/2021 FINDINGS: CT HEAD FINDINGS Brain: No evidence of acute infarction, hemorrhage, hydrocephalus, extra-axial collection or mass lesion/mass effect. Large chronic bilateral cerebellar infarcts with encephalomalacia. Patchy low-density changes within the periventricular and subcortical white matter compatible with chronic microvascular ischemic change. Mild-moderate diffuse cerebral volume loss. Vascular: Atherosclerotic calcifications involving the large vessels of the skull base. No unexpected hyperdense vessel. Skull: Normal. Negative for fracture or focal lesion. Sinuses/Orbits: No acute finding. Other: None. CT CERVICAL SPINE FINDINGS Alignment: Facet joints are aligned without dislocation or traumatic listhesis. Dens and lateral masses are aligned. Slight reversal of the cervical lordosis. Skull base and vertebrae: No acute fracture. No primary bone lesion or focal pathologic process. Soft tissues and spinal canal: No prevertebral fluid or swelling. No visible canal hematoma. Disc levels: Moderate-severe multilevel degenerative disc disease of the cervical spine, not appreciably progressed from the previous study peer Upper chest: Negative. Other: Fat attenuation mass within the right levator scapulae muscle measuring 5.6 x 2.6 x 3.7 cm has increased in size from 10/31/2018 (measured approximately 4.6 x 2.1 x 3.2 cm). Similar degree of wispy internal soft tissue density. IMPRESSION: 1. No acute intracranial abnormality. 2. No acute fracture or subluxation of the cervical spine. 3. Large chronic bilateral cerebellar infarcts with encephalomalacia. 4. Large fat attenuation mass  within the right levator scapulae muscle measuring up to 5.6 cm has slowly increased in size compared to 2020. This is not definitively a simple lipoma. Contrast enhanced MRI could be obtained if further evaluation is clinically warranted given patient age and comorbidities. Of note, no hypermetabolic activity was seen within this lesion on the recent PET-CT Electronically Signed   By: Davina Poke D.O.   On: 10/04/2021 12:52   CT Cervical Spine Wo Contrast  Result Date: 10/04/2021 CLINICAL DATA:  Head trauma, minor (Age >= 65y); Neck trauma (Age >= 65y) EXAM: CT HEAD WITHOUT CONTRAST CT CERVICAL SPINE WITHOUT CONTRAST TECHNIQUE: Multidetector CT imaging of the head and cervical spine was performed following the standard protocol without intravenous contrast. Multiplanar CT image reconstructions of the cervical spine were also generated. RADIATION DOSE REDUCTION: This exam was performed according to the departmental dose-optimization program which includes automated exposure control, adjustment of the mA and/or kV according to patient size and/or use of iterative reconstruction technique. COMPARISON:  01/30/2021, 10/31/2018, 08/19/2021 FINDINGS: CT  HEAD FINDINGS Brain: No evidence of acute infarction, hemorrhage, hydrocephalus, extra-axial collection or mass lesion/mass effect. Large chronic bilateral cerebellar infarcts with encephalomalacia. Patchy low-density changes within the periventricular and subcortical white matter compatible with chronic microvascular ischemic change. Mild-moderate diffuse cerebral volume loss. Vascular: Atherosclerotic calcifications involving the large vessels of the skull base. No unexpected hyperdense vessel. Skull: Normal. Negative for fracture or focal lesion. Sinuses/Orbits: No acute finding. Other: None. CT CERVICAL SPINE FINDINGS Alignment: Facet joints are aligned without dislocation or traumatic listhesis. Dens and lateral masses are aligned. Slight reversal of the  cervical lordosis. Skull base and vertebrae: No acute fracture. No primary bone lesion or focal pathologic process. Soft tissues and spinal canal: No prevertebral fluid or swelling. No visible canal hematoma. Disc levels: Moderate-severe multilevel degenerative disc disease of the cervical spine, not appreciably progressed from the previous study peer Upper chest: Negative. Other: Fat attenuation mass within the right levator scapulae muscle measuring 5.6 x 2.6 x 3.7 cm has increased in size from 10/31/2018 (measured approximately 4.6 x 2.1 x 3.2 cm). Similar degree of wispy internal soft tissue density. IMPRESSION: 1. No acute intracranial abnormality. 2. No acute fracture or subluxation of the cervical spine. 3. Large chronic bilateral cerebellar infarcts with encephalomalacia. 4. Large fat attenuation mass within the right levator scapulae muscle measuring up to 5.6 cm has slowly increased in size compared to 2020. This is not definitively a simple lipoma. Contrast enhanced MRI could be obtained if further evaluation is clinically warranted given patient age and comorbidities. Of note, no hypermetabolic activity was seen within this lesion on the recent PET-CT Electronically Signed   By: Davina Poke D.O.   On: 10/04/2021 12:52    Procedures Procedures    Medications Ordered in ED Medications - No data to display  ED Course/ Medical Decision Making/ A&P                           Medical Decision Making Amount and/or Complexity of Data Reviewed Radiology: ordered.   Patient with chief complaint of thoracic spine pain.  Differential diagnosis includes but is not limited to fracture, dislocation, soft tissue injury, and others  I reviewed the patient's past medical history.  Recent visits noted with oncology for history of breast cancer.  The patient remembers the fall and it sounds mechanical in nature.  I see no indication for lab work at this time  I ordered and personally reviewed  imaging including CT head, cervical spine, thoracic spine.  Official radiologist read: 1. No acute intracranial abnormality.  2. No acute fracture or subluxation of the cervical spine.  3. Large chronic bilateral cerebellar infarcts with  encephalomalacia.  4. Large fat attenuation mass within the right levator scapulae  muscle measuring up to 5.6 cm has slowly increased in size compared  to 2020. This is not definitively a simple lipoma. Contrast enhanced  MRI could be obtained if further evaluation is clinically warranted  given patient age and comorbidities. Of note, no hypermetabolic  activity was seen within this lesion on the recent PET-CT   1. Stable remote compression fractures at T12 and L1.  2. No acute fracture or traumatic subluxation.  3. Stable scoliosis.  4. Right foraminal narrowing at T2-3 and to lesser extent T9-10.  5. Aortic valve replacement.  6. Aortic Atherosclerosis  I agree with radiologist findings.  Upon reassessment the patient began to complain of left-sided hip/pelvis pain.  She states it hurts  to move her left leg at this time.  I have ordered and interpreted plain films of the left hip/pelvis.  No fracture noted I agree with the radiologist findings.  The patient has no sign of intracranial injury, fracture, dislocation.  No acute injury.  I did inform the patient and her son about the mass that was noted in the right levator scapula muscle and recommended they discuss this with her primary care provider and oncologist.  The patient does have active cancer and is currently on therapy.  They voiced understanding.  She may discharge at this time        Final Clinical Impression(s) / ED Diagnoses Final diagnoses:  Fall, initial encounter  Left hip pain  Mid back pain    Rx / DC Orders ED Discharge Orders     None         Ronny Bacon 10/04/21 1523    Blanchie Dessert, MD 10/07/21 [phone]

## 2021-10-04 NOTE — ED Notes (Signed)
Patient transported to CT 

## 2021-10-04 NOTE — ED Notes (Signed)
Pt ambulated well to the RR pt son stated that it was her normal walk pt did complain of pain in legs mostly in her knee

## 2021-10-04 NOTE — ED Triage Notes (Signed)
Pt to ER via EMS from [place].  Pt fell to buttocks then fell backwards hitting back and head on floor.  Pt takes blood thinners.  Pt denies head pain no injury noted.  Pt c/o mid back pain.  Pt arrives alert and orient.

## 2021-10-04 NOTE — Progress Notes (Signed)
Orthopedic Tech Progress Note Patient Details:  Julie Mcgee 11/01/1933 [id number]  Patient ID: Julie Mcgee, female   DOB: [birth year], 88 y.o.   MRN: [id number] Level II; not currently needed. [place] A Arneda [place] 10/04/2021, 11:11 AM

## 2021-10-05 ENCOUNTER — Telehealth: Payer: Self-pay | Admitting: Gastroenterology

## 2021-10-05 NOTE — Telephone Encounter (Addendum)
Pt is scheduled for an office appointment with Vicie Mutters, PA on Monday 10/12/21. Son reports that diarrhea initially improved on the cholestyramine and budesonide but diarrhea has returned. Pt's son believes pt is having diarrhea about 3-4 times per week and reports pt is incontinent of stool.  Asked son if pt was taking budesonide still and pt's son was unsure. Called facility [phone] to clarify if pt is currently taking budesonide or just cholestyramine. Left voicemail for Wells Guiles to call back.

## 2021-10-05 NOTE — Telephone Encounter (Signed)
Spoke with pt's nurse who stated that pt is currently taking 3 mg of budesonide daily and cholestyramine 2x per day. Asked pt's nurse if pt was able to take imodium and nurse stated that pt has imodium available to take and nurse stated she would remind pt that she has imodium available. Called pt's son and let him know that pt is currently taking budesonide and cholestyramine. Also let pt's son know that pt has imodium available that she can take and pt's nurse was going to remind her of the imodium. Let pt's son know that pt can take imodium to get her through until her appointment on 10/12/21.

## 2021-10-05 NOTE — Telephone Encounter (Signed)
Patients son called seeking advise, states the patient has been having a lot of diarrhea.

## 2021-10-06 NOTE — Progress Notes (Signed)
Remote pacemaker transmission.   

## 2021-10-09 NOTE — Progress Notes (Unsigned)
10/09/2021 Julie Mcgee [id number] 06/27/1933  Referring provider: Edmonia Caprio, * Primary GI doctor: Dr. Bryan Lemma  ASSESSMENT AND PLAN:   Assessment: 88 y.o. female here for assessment of the following: No diagnosis found.  Plan:  No orders of the defined types were placed in this encounter.   No orders of the defined types were placed in this encounter.   History of Present Illness:  88 y.o. 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 *** 11/28/2019: Colonoscopy: 3 small sigmoid polyps (path: TA x1, HP x2), Benign rectal hyperplastic polyps, otherwise normal colonic mucosa but biopsies demonstrate Lymphocytic Colitis.  Healthy-appearing anastomosis in transverse colon.  Small internal hemorrhoids.  Started Questran.  Repeat colonoscopy in 3 years ( 11/2022) PET/CT in 05/2020 without uptake in abdomen or GI tract  06/09/2021 patient called complaining of diarrhea, more intermittent 2-3 times a week with fecal incontinence, some response to bile acid sequestrant and budesonide but then son called back 10/05/2021 with continuing issues. C. difficile and GI pathogen panel negative  She  reports that she quit smoking about 12 years ago. Her smoking use included cigarettes. She has a 45.00 pack-year smoking history. She has never used smokeless  tobacco. She reports that she does not currently use alcohol. She reports that she does not use drugs. Her family history includes Alcohol abuse in her father; Chronic Renal Failure in her mother; Heart failure in her mother; Skin cancer (age of onset: 78) in her sister; Skin cancer (age of onset: 66) in her sister.   Current Medications:   Current Outpatient Medications (Endocrine & Metabolic):    levothyroxine (SYNTHROID) 100 MCG tablet, Take 100 mcg by mouth daily before breakfast.   Current Outpatient Medications (Cardiovascular):    cholestyramine (QUESTRAN) 4 g packet, TAKE 1 PACKET (4 G TOTAL) BY MOUTH 2 (TWO) TIMES DAILY. TAKE OTHER MEDICATIONS AT LEAST 1 HOUR BEFORE OR 4 TO 6 HOURS AFTER CHOLESTYRAMINE   furosemide (LASIX) 20 MG tablet, Take 1 tablet (20 mg total) by mouth daily. Take 2 tablets (40 mg) twice daily for three days, then reduce to 1 tablet (20 mg total) once daily.   metoprolol tartrate (LOPRESSOR) 50 MG tablet, Take 50 mg by mouth 2 (two) times daily.   simvastatin (ZOCOR) 40 MG tablet, Take 40 mg by mouth daily.    Current Outpatient Medications (Hematological):    apixaban (ELIQUIS) 5 MG TABS tablet, Take 1 tablet (5 mg total) by mouth 2 (two) times daily.  Current Outpatient Medications (Other):    Calcium Carb-Cholecalciferol (CALTRATE 600+D3 PO), Take 1 tablet by mouth 2 (two) times daily.   famotidine (PEPCID) 40 MG tablet, Take 1 tablet (40 mg total) by mouth daily.   Multiple Vitamins-Minerals (MULTIVITAMIN ADULTS 50+) TABS, Take 1 tablet by mouth daily.   Probiotic Product (PROBIOTIC DAILY PO), Take 1 tablet by mouth daily.   tamoxifen (NOLVADEX) 20 MG tablet, Take 1 tablet (20 mg total) by mouth daily.  Surgical  History:  She  has a past surgical history that includes Knee surgery (1998); Hip surgery (2005); Excision basal cell carcinoma (2018); Colonoscopy (2001); RIGHT/LEFT HEART CATH AND CORONARY ANGIOGRAPHY (N/A, 02/21/2018); Cardiac catheterization;  Tonsillectomy; Transcatheter aortic valve replacement, transfemoral (N/A, 02/28/2018); TEE without cardioversion (N/A, 02/28/2018); TEMPORARY PACEMAKER (N/A, 03/01/2018); PACEMAKER IMPLANT (N/A, 03/02/2018); Esophagogastroduodenoscopy (egd) with propofol (N/A, 05/19/2018); Colonoscopy with propofol (N/A, 05/19/2018); biopsy (05/19/2018); Submucosal tattoo injection (05/19/2018); and Laparoscopic partial colectomy (N/A, 05/24/2018).  Current Medications, Allergies, Past Medical History, Past Surgical History, Family History and Social History were reviewed in [employer] record.  Physical Exam: There were no vitals taken for this visit. General:   Pleasant, well developed female in no acute distress Heart : Regular rate and rhythm; no murmurs Pulm: Clear anteriorly; no wheezing Abdomen:  {BlankSingle:19197::"Distended","Ridged","Soft"}, {BlankSingle:19197::"Flat","Obese","Non-distended"} AB, {BlankSingle:19197::"Absent","Hyperactive, tinkling","Hypoactive","Sluggish","Active"} bowel sounds. {actendernessAB:27319} tenderness {anatomy; site abdomen:5010}. {BlankMultiple:19196::"Without guarding","With guarding","Without rebound","With rebound"}, No organomegaly appreciated. Rectal: {acrectalexam:27461} Extremities:  {With/without:5700}  edema. Neurologic:  Alert and  oriented x4;  No focal deficits.  Psych:  Cooperative. Normal mood and affect.   Vladimir Crofts, PA-C 10/09/21

## 2021-10-12 ENCOUNTER — Ambulatory Visit (INDEPENDENT_AMBULATORY_CARE_PROVIDER_SITE_OTHER): Payer: Medicare Other | Admitting: Physician Assistant

## 2021-10-12 ENCOUNTER — Encounter: Payer: Self-pay | Admitting: Physician Assistant

## 2021-10-12 ENCOUNTER — Ambulatory Visit (INDEPENDENT_AMBULATORY_CARE_PROVIDER_SITE_OTHER)
Admission: RE | Admit: 2021-10-12 | Discharge: 2021-10-12 | Disposition: A | Discharge status: Home / Self Care | Payer: Medicare Other | Source: Ambulatory Visit | Attending: Physician Assistant | Admitting: Physician Assistant

## 2021-10-12 VITALS — BP 126/68 | HR 63 | Ht 62.0 in | Wt 172.2 lb

## 2021-10-12 DIAGNOSIS — R197 Diarrhea, unspecified: Secondary | ICD-10-CM

## 2021-10-12 DIAGNOSIS — K52832 Lymphocytic colitis: Secondary | ICD-10-CM | POA: Diagnosis not present

## 2021-10-12 DIAGNOSIS — Z85038 Personal history of other malignant neoplasm of large intestine: Secondary | ICD-10-CM | POA: Diagnosis not present

## 2021-10-12 NOTE — Patient Instructions (Addendum)
Your provider has requested that you have an abdominal x ray before leaving today. Please go to the basement floor to our Radiology department for the test.  If this shows stool this is called overflow diarrhea and we will need to adjust the meds If this does not show large stool burden than we will increase the budesonide.  _______________________________________________________  If you are age 86 or older, your body mass index should be between 23-30. Your Body mass index is 31.5 kg/m. If this is out of the aforementioned range listed, please consider follow up with your Primary Care Provider.  If you are age 88 or younger, your body mass index should be between 19-25. Your Body mass index is 31.5 kg/m. If this is out of the aformentioned range listed, please consider follow up with your Primary Care Provider.   ________________________________________________________  The [place] [facility] providers would like to encourage you to use [facility] to communicate with providers for non-urgent requests or questions.  Due to long hold times on the telephone, sending your provider a message by [facility] may be a faster and more efficient way to get a response.  Please allow 48 business hours for a response.  Please remember that this is for non-urgent requests.  _______________________________________________________

## 2021-10-13 NOTE — Progress Notes (Signed)
Agree with the assessment and plan as outlined by Amanda Collier, PA-C. ? ?Morene Cecilio, DO, FACG ? ?

## 2021-10-19 ENCOUNTER — Telehealth: Payer: Self-pay | Admitting: Gastroenterology

## 2021-10-19 NOTE — Telephone Encounter (Signed)
Patient's son called, states he would like to speak to you. Requesting a call back. Please call to advise.

## 2021-10-19 NOTE — Telephone Encounter (Signed)
Patient's son just wanted to know if it was okay for his mom to try eating yogurt since it is a good probiotic. Advised him that she could try as tolerated depending on her symptoms. He verbalized all understanding.

## 2021-11-10 ENCOUNTER — Encounter: Payer: Self-pay | Admitting: Gastroenterology

## 2021-11-10 ENCOUNTER — Encounter: Payer: Self-pay | Admitting: Cardiology

## 2021-11-10 ENCOUNTER — Encounter: Payer: Self-pay | Admitting: Hematology

## 2021-11-13 ENCOUNTER — Other Ambulatory Visit: Payer: Medicare Other

## 2021-11-23 ENCOUNTER — Other Ambulatory Visit: Payer: Medicare Other

## 2021-11-23 ENCOUNTER — Telehealth: Payer: Medicare Other | Admitting: Hematology

## 2021-11-23 ENCOUNTER — Ambulatory Visit: Payer: Medicare Other | Admitting: Hematology

## 2021-11-25 ENCOUNTER — Ambulatory Visit: Payer: Medicare Other | Admitting: Cardiology

## 2021-11-26 ENCOUNTER — Other Ambulatory Visit: Payer: Medicare Other

## 2021-11-26 ENCOUNTER — Ambulatory Visit: Payer: Medicare Other | Admitting: Hematology

## 2021-12-02 ENCOUNTER — Other Ambulatory Visit: Payer: Self-pay

## 2021-12-02 ENCOUNTER — Telehealth: Payer: Self-pay

## 2021-12-02 DIAGNOSIS — C50212 Malignant neoplasm of upper-inner quadrant of left female breast: Secondary | ICD-10-CM

## 2021-12-02 DIAGNOSIS — I442 Atrioventricular block, complete: Secondary | ICD-10-CM

## 2021-12-02 DIAGNOSIS — C50811 Malignant neoplasm of overlapping sites of right female breast: Secondary | ICD-10-CM

## 2021-12-02 DIAGNOSIS — C182 Malignant neoplasm of ascending colon: Secondary | ICD-10-CM

## 2021-12-02 NOTE — Telephone Encounter (Signed)
Received request to refer Pt to EP in [place].  Referral placed.  Will need request for remote transmissions from [place] office upon evaluation.

## 2021-12-02 NOTE — Progress Notes (Signed)
Referral sent to Dr. Dellia Beckwith with [facility] and [place] [phone]).  Pt relocated to [place].  Referral order, pt demographics, Dr. Ernestina Penna last office note, pathology report, and PET Scan report sent with fax.  Fax confirmation received.

## 2021-12-07 ENCOUNTER — Other Ambulatory Visit: Payer: Self-pay

## 2021-12-25 ENCOUNTER — Ambulatory Visit (INDEPENDENT_AMBULATORY_CARE_PROVIDER_SITE_OTHER): Payer: Medicare Other

## 2021-12-25 DIAGNOSIS — I442 Atrioventricular block, complete: Secondary | ICD-10-CM

## 2021-12-25 LAB — CUP PACEART REMOTE DEVICE CHECK
Battery Remaining Longevity: 96 mo
Battery Voltage: 3 V
Brady Statistic AP VP Percent: 65.91 %
Brady Statistic AP VS Percent: 0.15 %
Brady Statistic AS VP Percent: 33.87 %
Brady Statistic AS VS Percent: 0.07 %
Brady Statistic RA Percent Paced: 66.05 %
Brady Statistic RV Percent Paced: 99.78 %
Date Time Interrogation Session: 20231215054714
Implantable Lead Connection Status: 753985
Implantable Lead Connection Status: 753985
Implantable Lead Implant Date: 20200220
Implantable Lead Implant Date: 20200220
Implantable Lead Location: 753859
Implantable Lead Location: 753860
Implantable Lead Model: 3830
Implantable Lead Model: 5076
Implantable Pulse Generator Implant Date: 20200220
Lead Channel Impedance Value: 285 Ohm
Lead Channel Impedance Value: 285 Ohm
Lead Channel Impedance Value: 361 Ohm
Lead Channel Impedance Value: 456 Ohm
Lead Channel Pacing Threshold Amplitude: 0.625 V
Lead Channel Pacing Threshold Amplitude: 0.875 V
Lead Channel Pacing Threshold Pulse Width: 0.4 ms
Lead Channel Pacing Threshold Pulse Width: 0.4 ms
Lead Channel Sensing Intrinsic Amplitude: 25 mV
Lead Channel Sensing Intrinsic Amplitude: 25 mV
Lead Channel Sensing Intrinsic Amplitude: 3.125 mV
Lead Channel Sensing Intrinsic Amplitude: 3.125 mV
Lead Channel Setting Pacing Amplitude: 1.5 V
Lead Channel Setting Pacing Amplitude: 2.5 V
Lead Channel Setting Pacing Pulse Width: 0.4 ms
Lead Channel Setting Sensing Sensitivity: 2.8 mV
Zone Setting Status: 755011

## 2022-01-12 NOTE — Progress Notes (Signed)
This nurse received a signed request from this patient to release results for PET and Mammogram from 2022 and the first office notes from this clinic to be faxed to [facility].  Records have been faxed to the attention of [facility] F: [phone].  Confirmation received.

## 2022-01-14 NOTE — Progress Notes (Signed)
Remote pacemaker transmission.   

## 2022-07-30 ENCOUNTER — Encounter: Payer: Self-pay | Admitting: Hematology

## 2022-07-30 ENCOUNTER — Encounter (HOSPITAL_BASED_OUTPATIENT_CLINIC_OR_DEPARTMENT_OTHER): Payer: Self-pay | Admitting: Cardiology

## 2022-08-02 ENCOUNTER — Telehealth: Payer: Self-pay | Admitting: Hematology

## 2022-08-02 NOTE — Telephone Encounter (Signed)
Pt has been scheduled with Dr Ladona Ridgel and Tereso Newcomer on 09/08/2022 to re-establish care.

## 2022-08-07 ENCOUNTER — Other Ambulatory Visit: Payer: Self-pay

## 2022-08-07 DIAGNOSIS — Z170 Estrogen receptor positive status [ER+]: Secondary | ICD-10-CM

## 2022-08-08 NOTE — Progress Notes (Unsigned)
Patient Care Team: Ashley Royalty, NP as PCP - General (Adult Health Nurse Practitioner) Tonny Bollman, MD as PCP - Structural Heart (Cardiology) Marinus Maw, MD as PCP - Electrophysiology (Cardiology) Quintella Reichert, MD as PCP - Cardiology (Cardiology) Marvel Plan, MD as Consulting Physician (Neurology) Pershing Proud, RN as Oncology Nurse Navigator Donnelly Angelica, RN as Oncology Nurse Navigator Abigail Miyamoto, MD as Consulting Physician (General Surgery) Malachy Mood, MD as Consulting Physician (Hematology) Dorothy Puffer, MD as Consulting Physician (Radiation Oncology)   CHIEF COMPLAINT: Follow up bilateral breast cancer   Oncology History Overview Note  Cancer Staging Malignant neoplasm of ascending colon [facility]) Staging form: Colon and Rectum, AJCC 8th Edition - Pathologic stage from 05/24/2018: Stage I (pT1, pN0, cM0) - Signed by Arthur Holms, MD on 06/07/2018 Total positive nodes: 0  Malignant neoplasm of overlapping sites of right breast [facility]) Staging form: Breast, AJCC 8th Edition - Clinical stage from 05/21/2020: Stage IIA (cT2, cN1(f), cM0, G2, ER+, PR+, HER2-) - Unsigned Stage prefix: Initial diagnosis Method of lymph node assessment: Core biopsy Histologic grading system: 3 grade system  Malignant neoplasm of upper-inner quadrant of left breast in female, estrogen receptor positive (HCC) Staging form: Breast, AJCC 8th Edition - Clinical stage from 05/21/2020: Stage IA (cT1a, cN0, cM0, G2, ER+, PR+, HER2-) - Unsigned Stage prefix: Initial diagnosis Method of lymph node assessment: Clinical Histologic grading system: 3 grade system    Malignant neoplasm of ascending colon [facility])   Initial Diagnosis   Malignant neoplasm of ascending colon (HCC)   05/19/2018 Procedure   Colonoscopy: - Likely malignant tumor in the ascending colon. Biopsied. Tattooed. This is the source of the anemia. - A few 2 to 5 mm polyps in the rectum, in the sigmoid colon and in the  ascending colon. - Non-bleeding internal hemorrhoids. - The examined portion of the ileum was normal.   05/19/2018 Pathology Results   1. Duodenum, Biopsy - BENIGN SMALL BOWEL MUCOSA. - NO VILLOUS BLUNTING OR INCREASE IN INTRAEPITHELIAL LYMPHOCYTES. - NO DYSPLASIA OR MALIGNANCY. 2. Stomach, polyp(s) - FUNDIC GLAND POLYP. - WARTHIN-STARRY IS NEGATIVE FOR HELICOBACTER PYLORI. - NO INTESTINAL METAPLASIA, DYSPLASIA, OR MALIGNANCY. 3. Colon, biopsy, ascending - INVASIVE ADENOCARCINOMA.   05/20/2018 Imaging   CT CAP: IMPRESSION: 1. No CT findings of the abdomen or pelvis to localize suspected GI bleeding. No intraluminal contrast noted.   2. Focal soft tissue thickening of the colon wall superior to the ileocecal valve may reflect reported ascending colon mass.   3. There is extensive fatty mural stratification of the cecum, suggesting chronic inflammatory sequelae of colitis, including Crohn's disease.   4. Moderate right, small left pleural effusions and associated atelectasis or consolidation. There is mild, diffuse interlobular septal thickening, most consistent with edema.   5.  Interval placement of aortic valve stent endograft.   6. There is a lobulated 2.6 cm mass in the central right breast (series 3, image 39), as seen on prior examination. Correlate with mammography.   7.  Other chronic and incidental findings as detailed above.   05/24/2018 Pathology Results   Accession: [id number]  Procedure: Right colon and ileum resection. Tumor Site: Proximal right colon. Tumor Size: 2.5 cm. Macroscopic Tumor Perforation: Not identified. Histologic Type: Invasive adenocarcinoma. Histologic Grade: G2, moderately differentiated. Tumor Extension: Into submucosa. Margins: Negative. Treatment Effect: N/A. Lymphovascular Invasion: N/A. Perineural Invasion: N/A. Tumor Deposits: Not identified. Regional Lymph Nodes: Number of Lymph Nodes Involved: 0 Number of Lymph Nodes  Examined:  18 Pathologic Stage Classification (pTNM, AJCC 8th Edition): pT1, pN0 Ancillary Studies: MSI will be ordered. MMR was performed on the biopsy and showed loss of MLH1 and PMS2.  MSI-high, BRAF [id number] mutation present    05/24/2018 Cancer Staging   Staging form: Colon and Rectum, AJCC 8th Edition - Pathologic stage from 05/24/2018: Stage I (pT1, pN0, cM0) - Signed by Arthur Holms, MD on 06/07/2018   Malignant neoplasm of upper-inner quadrant of left breast in female, estrogen receptor positive (HCC)  04/28/2020 Mammogram   Mammogram 04/28/20  The 3.2cm irregular mass in the right breast at 12:00 position middle depth, 5cmfn, is highly suggesitve of malignancy. An US guided biopsy is recommended.   the 0.6cm mass in the left breast upper inner aspect posterior depth, 11cmfn is indetermineate. There also are fine branching linear calcifications spanning 1.4cm in teh left breast at 6:00 position middle depth 8.5cmfn. An [place] is reocmmended.      05/14/2020 Initial Biopsy   1.Breast, Left, needle core biopsy, 10:00 position, middle depth, 8cmfn -INVASIVE DUCTAL CARCINOMA 2. breast, right, needle core biopsy, 12:00 position, middle depth, 8cmfn -INVASIVE DUCTAL CARCINOMA 3. Lymph node, biopsy, axillary -METASTATIC CARCINOMA INVOLVING NODAL TISSUE 4. Breast, left needle core biopsy, 6:00 position, 7.3cmfn -FOCAL ATYPICAL DUCTAL HYPERPLASIA WITH CALCIFICATIONS.   1. Grade 2-3 measures 0.4cm  2. Grade 2-3 and measurers 1cm    05/16/2020 Initial Diagnosis   Malignant neoplasm of upper-inner quadrant of left breast in female, estrogen receptor positive (HCC)   Malignant neoplasm of upper-outer quadrant of right breast in female, estrogen receptor positive (HCC)  04/28/2020 Mammogram   Mammogram 04/28/20  The 3.2cm irregular mass in the right breast at 12:00 position middle depth, 5cmfn, is highly suggesitve of malignancy. An US guided biopsy is recommended.   the 0.6cm mass in the left breast  upper inner aspect posterior depth, 11cmfn is indetermineate. There also are fine branching linear calcifications spanning 1.4cm in teh left breast at 6:00 position middle depth 8.5cmfn. An [place] is reocmmended.      05/14/2020 Initial Biopsy   1.Breast, Left, needle core biopsy, 10:00 position, middle depth, 8cmfn -INVASIVE DUCTAL CARCINOMA 2. breast, right, needle core biopsy, 12:00 position, middle depth, 8cmfn -INVASIVE DUCTAL CARCINOMA 3. Lymph node, biopsy, axillary -METASTATIC CARCINOMA INVOLVING NODAL TISSUE 4. Breast, left needle core biopsy, 6:00 position, 7.3cmfn -FOCAL ATYPICAL DUCTAL HYPERPLASIA WITH CALCIFICATIONS.   1. Grade 2-3 measures 0.4cm  2. Grade 2-3 and measurers 1cm    05/21/2020 Initial Diagnosis   Malignant neoplasm of upper-outer quadrant of right breast in female, estrogen receptor positive (HCC)      CURRENT THERAPY: Tamoxifen, started 05/2020  INTERVAL HISTORY Ms. Lopinto returns for follow up, last seen virtually by Dr. Mosetta Putt 08/26/21, she then moved to [place]. She saw Dr. Fransisco Hertz on 04/28/22 and continued Tamoxifen. Her husband died in the last year, she became tearful talking about him. She recently moved back and is here to re-establish care. Living at [facility]. Tolerating tamoxifen without side effects. Denies breast changes. Very hard of hearing. Son, Will, is with her. I reviewed meds with him, not sure when she started cymbalta, feels that her mood still fluctuates.   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   23 mm Edwards Sapien 3 transcatheter heart valve placed via percutaneous right transfemoral approach    Severe aortic stenosis    2D echo 02/2019 with severe AS and moderate to severe AI     Past Surgical History:  Procedure Laterality Date   BASAL CELL CARCINOMA EXCISION  2018   BIOPSY  05/19/2018   Procedure: BIOPSY;  Surgeon: Shellia Cleverly, DO;  Location: [facility];  Service: Gastroenterology;;   CARDIAC CATHETERIZATION     COLONOSCOPY  2001   COLONOSCOPY WITH PROPOFOL N/A 05/19/2018   Procedure: COLONOSCOPY WITH PROPOFOL;  Surgeon: Shellia Cleverly, DO;  Location: [facility];  Service: Gastroenterology;  Laterality: N/A;   ESOPHAGOGASTRODUODENOSCOPY (EGD) WITH PROPOFOL N/A 05/19/2018   Procedure: ESOPHAGOGASTRODUODENOSCOPY (EGD) WITH PROPOFOL;  Surgeon: Shellia Cleverly, DO;  Location: [facility];  Service: Gastroenterology;  Laterality: N/A;   HIP SURGERY  2005   KNEE SURGERY  1998   LAPAROSCOPIC PARTIAL COLECTOMY N/A 05/24/2018   Procedure: LAPAROSCOPIC ASSISTED ASCENDING HEMICOLECTOMY;  Surgeon: Andria Meuse, MD;  Location: [facility] OR;  Service: General;  Laterality: N/A;   PACEMAKER IMPLANT N/A 03/02/2018   Procedure: PACEMAKER IMPLANT;  Surgeon: Marinus Maw, MD;  Location: [facility] INVASIVE CV LAB;  Service: Cardiovascular;  Laterality: N/A;   RIGHT/LEFT HEART CATH AND CORONARY ANGIOGRAPHY N/A 02/21/2018   Procedure: RIGHT/LEFT HEART CATH AND CORONARY ANGIOGRAPHY;  Surgeon: Tonny Bollman, MD;  Location: [facility] INVASIVE CV LAB;  Service: Cardiovascular;  Laterality: N/A;   SUBMUCOSAL TATTOO INJECTION  05/19/2018   Procedure: SUBMUCOSAL TATTOO INJECTION;  Surgeon: Shellia Cleverly, DO;  Location: [facility];  Service: Gastroenterology;;   TEE WITHOUT CARDIOVERSION N/A 02/28/2018   Procedure: TRANSESOPHAGEAL ECHOCARDIOGRAM  (TEE);  Surgeon: Tonny Bollman, MD;  Location[facility] INVASIVE CV LAB;  Service: Open Heart Surgery;  Laterality: N/A;   TEMPORARY PACEMAKER N/A 03/01/2018   Procedure: TEMPORARY PACEMAKER;  Surgeon: Kathleene Hazel, MD;  [facility]: [facility] INVASIVE CV LAB;  Service: Cardiovascular;  Laterality: N/A;   TONSILLECTOMY     TRANSCATHETER AORTIC VALVE REPLACEMENT, TRANSFEMORAL N/A 02/28/2018   Procedure: TRANSCATHETER AORTIC VALVE REPLACEMENT, TRANSFEMORAL;  Surgeon: Tonny Bollman, MD;  Location: [facility] INVASIVE CV LAB;  Service: Open Heart Surgery;  Laterality: N/A;     Outpatient Encounter Medications as of 08/09/2022  Medication Sig   apixaban (ELIQUIS) 5 MG TABS tablet Take 1 tablet (5 mg total) by mouth 2 (two) times daily.   budesonide (ENTOCORT EC) 3 MG 24 hr capsule Take 3 mg by mouth daily.   busPIRone (BUSPAR) 7.5 MG tablet Take 7.5 mg by mouth 2 (two) times daily.   Calcium Carb-Cholecalciferol (CALTRATE 600+D3 PO) Take 1 tablet by mouth 2 (two) times daily.   cholestyramine (QUESTRAN) 4 g packet TAKE 1 PACKET (4 G TOTAL) BY MOUTH 2 (TWO) TIMES DAILY. TAKE OTHER MEDICATIONS AT LEAST 1 HOUR BEFORE OR 4 TO 6 HOURS AFTER CHOLESTYRAMINE   DULoxetine (CYMBALTA) 30 MG capsule Take 30 mg by mouth daily.   furosemide (LASIX) 20 MG tablet Take 1 tablet (20 mg total) by mouth  daily. Take 2 tablets (40 mg) twice daily for three days, then reduce to 1 tablet (20 mg total) once daily.   levothyroxine (SYNTHROID) 100 MCG tablet Take 100 mcg by mouth daily before breakfast.    metoprolol tartrate (LOPRESSOR) 50 MG tablet Take 50 mg by mouth 2 (two) times daily.   Multiple Vitamins-Minerals (MULTIVITAMIN ADULTS 50+) TABS Take 1 tablet by mouth daily.   Probiotic Product (PROBIOTIC DAILY PO) Take 1 tablet by mouth daily.   simvastatin (ZOCOR) 40 MG tablet Take 40 mg by mouth daily.   tamoxifen (NOLVADEX) 20 MG tablet Take 1 tablet (20 mg total) by mouth daily.   famotidine (PEPCID) 40 MG tablet Take 1 tablet  (40 mg total) by mouth daily.   No facility-administered encounter medications on file as of 08/09/2022.     Today's Vitals   08/09/22 1144 08/09/22 1149  BP: (!) 159/79 134/80  Pulse: 61   Resp: 18   Temp: 97.7 F (36.5 C)   TempSrc: Temporal   SpO2: 98%   Weight: 155 lb 12.8 oz (70.7 kg)   Height: 5\' 2"  (1.575 m)   PainSc:  0-No pain   Body mass index is 28.5 kg/m.   PHYSICAL EXAM GENERAL:alert, no distress and comfortable SKIN: no rash  EYES: sclera clear  LUNGS: clear with normal breathing effort HEART: regular rate & rhythm, mild bilateral lower extremity edema NEURO: alert & oriented x 3 with fluent speech, hard of hearing. Exam performed in wheelchair Breast exam: left nipple inversion, no b/l discharge. Palpable 3 x 2.5 cm upper R breast mass. No palpable left breast mass or axillary adenopathy      CBC    Component Value Date/Time   WBC 9.8 08/09/2022 1049   WBC 11.1 (H) 05/20/2021 1507   RBC 4.11 08/09/2022 1049   HGB 12.9 08/09/2022 1049   HGB 10.1 (L) 09/11/2018 1057   HCT 39.1 08/09/2022 1049   HCT 32.2 (L) 09/11/2018 1057   PLT 213 08/09/2022 1049   PLT 342 09/11/2018 1057   MCV 95.1 08/09/2022 1049   MCV 84 09/11/2018 1057   MCH 31.4 08/09/2022 1049   MCHC 33.0 08/09/2022 1049   RDW 14.0 08/09/2022 1049   RDW 16.7 (H) 09/11/2018 1057   LYMPHSABS 1.1 08/09/2022 1049   LYMPHSABS 1.3 03/16/2018 1326   MONOABS 0.6 08/09/2022 1049   EOSABS 0.4 08/09/2022 1049   EOSABS 0.8 (H) 03/16/2018 1326   BASOSABS 0.1 08/09/2022 1049   BASOSABS 0.1 03/16/2018 1326     CMP     Component Value Date/Time   NA 136 08/09/2022 1049   NA 138 03/16/2018 1326   K 3.5 08/09/2022 1049   CL 104 08/09/2022 1049   CO2 25 08/09/2022 1049   GLUCOSE 105 (H) 08/09/2022 1049   BUN 10 08/09/2022 1049   BUN 15 03/16/2018 1326   CREATININE 0.77 08/09/2022 1049   CALCIUM 9.0 08/09/2022 1049   PROT 6.2 (L) 08/09/2022 1049   ALBUMIN 3.4 (L) 08/09/2022 1049   AST 11 (L)  08/09/2022 1049   ALT 5 08/09/2022 1049   ALKPHOS 51 08/09/2022 1049   BILITOT 0.6 08/09/2022 1049   GFRNONAA >60 08/09/2022 1049   GFRAA >60 11/02/2018 0435     ASSESSMENT & PLAN:Julie Mcgee is a 89 y.o. female with    1. Right breast cancer, Stage IIA, c(T2N1M0), ER+/PR+/HER2-, Grade II-III and Left breast cancer, Stage IA, c(T2N0M0), ER+/PR+/HER2-, Grade II-III  -She has had palpable right breast mass  for about 5 years, even when she lived in [place]. This was seen on CT scan at time of colon cancer diagnosis in 2020, but patient declined work up at that time. -With new left breast nipple inversion, she proceeded with breast work up which showed larger right breast mass, extensive right axillary adnopathy, and a small left breast mass. Her 05/14/20 breast biopsy showed Invasive ductal carcinoma in right and left breast and positive right axillary LNs with grade 2-3 disease. -PET scan 05/29/20 showed: 2.9 cm mass in inferolateral right breast; numerous right axillary nodal metastases, additional high paratracheal nodal metastases; 2.4 cm mass in inferior left breast with mild hypermetabolism; no evidence of metastatic disease in abdomen/pelvis. -Because of the mediastinal lymphadenopathy, she is not a surgical candidate. She started tamoxifen, due to her osteoporosis (T score -2.4 with high risk hip fracture), on 06/02/20.  She tolerated very well. -Surveillance PET scan on 08/19/21 showed: response to therapy in right breast and axilla; resolution of left breast hypermetabolism and thoracic inlet nodes; residual low-level, non-specific hypermetabolism within mediastinal nodes; new focus of hypermetabolism in right infrahilar without CT correlate.  -She moved to [facility] and has not been seen by [place] since 08/26/21, saw onc in [facility] 04/2022 without changes to current regimen -Ms. Marlette appears stable, tolerating tamoxifen without significant SEs.  -Breast exam reveals left nipple inversion and 3 x 2.5 cm R  breast mass. I recommend PET scan to assess treatment response -We discussed the DDI with tamoxifen and Cymbalta which is a CYP2D6 inhibitor, and potential reduced efficacy of tamoxifen. I recommend to consider alternative such as effexor. Her son will consider this and discuss with her senior living facility. May make a decision pending PET scan -I will call with results. If stable, continue current regimen and f/up in 3 months    2. H/o early Stage colon cancer, pT1N0M0 -diagnosed in 05/2018, s/p colectomy. No additional treatment indicated. -She will continue cancer surveillance.   3. Commordities: H/o CVA in 2011, CAD, s/p TAVR in 02/2018, CHF, HTN, Hypothyroidism, Afib, Peripheral vascular disease. -She has multiple comorbidities, managed by her other physicians -Her 02/2019 Echo showed EF improved to 65-70%. -She is on Eliquis since Stroke. She had significant bleeding s/p breast biopsy. -She has residual right hand weakness, and her ambulation has decreased over the years. She has had frequent falls and currently ambulates with walker. -05/2020 DEXA showed T-score of -2.4 with high risk for hip fracture.    4. Social support -Her husband died last year, she is still quite sad. She had anxiety after his death and started Cymbalta -She moved to [facility] but has moved back, son Will is here and very involved -Lives at [facility]       PLAN: -Outside cancer center notes and today's labs reviewed -Continue tamoxifen 20 mg p.o. daily -Discussed DDI with Cymbalta and tamoxifen, I recommend an alternative such as Effexor, her son will consider it and discuss with her senior living facility -PET scan in the next 1 to 2 weeks to assess treatment response, I will call with results  -Follow-up in 3 months, or sooner if needed   Orders Placed This Encounter  Procedures   NM PET Image Restag (PS) Skull Base To Thigh    Standing Status:   Future    Standing Expiration Date:    08/09/2023    Order Specific Question:   If indicated for the ordered procedure, I authorize the administration of a radiopharmaceutical per Radiology protocol  Answer:   Yes    Order Specific Question:   Preferred imaging location?    Answer:   [facility]      All questions were answered. The patient knows to call the clinic with any problems, questions or concerns. No barriers to learning were detected. I spent 20 minutes counseling the patient face to face. The total time spent in the appointment was 30 minutes and more than 50% was on counseling, review of test results, and coordination of care.   Santiago Glad, NP-C 08/09/2022

## 2022-08-09 ENCOUNTER — Encounter: Payer: Self-pay | Admitting: Nurse Practitioner

## 2022-08-09 ENCOUNTER — Inpatient Hospital Stay: Payer: Medicare Other | Attending: Nurse Practitioner

## 2022-08-09 ENCOUNTER — Other Ambulatory Visit: Payer: Self-pay

## 2022-08-09 ENCOUNTER — Inpatient Hospital Stay (HOSPITAL_BASED_OUTPATIENT_CLINIC_OR_DEPARTMENT_OTHER): Payer: Medicare Other | Admitting: Nurse Practitioner

## 2022-08-09 VITALS — BP 134/80 | HR 61 | Temp 97.7°F | Resp 18 | Ht 62.0 in | Wt 155.8 lb

## 2022-08-09 DIAGNOSIS — I5032 Chronic diastolic (congestive) heart failure: Secondary | ICD-10-CM | POA: Diagnosis not present

## 2022-08-09 DIAGNOSIS — Z9049 Acquired absence of other specified parts of digestive tract: Secondary | ICD-10-CM | POA: Diagnosis not present

## 2022-08-09 DIAGNOSIS — D649 Anemia, unspecified: Secondary | ICD-10-CM | POA: Insufficient documentation

## 2022-08-09 DIAGNOSIS — I2510 Atherosclerotic heart disease of native coronary artery without angina pectoris: Secondary | ICD-10-CM | POA: Insufficient documentation

## 2022-08-09 DIAGNOSIS — Z87891 Personal history of nicotine dependence: Secondary | ICD-10-CM | POA: Diagnosis not present

## 2022-08-09 DIAGNOSIS — F419 Anxiety disorder, unspecified: Secondary | ICD-10-CM | POA: Diagnosis not present

## 2022-08-09 DIAGNOSIS — C50212 Malignant neoplasm of upper-inner quadrant of left female breast: Secondary | ICD-10-CM | POA: Insufficient documentation

## 2022-08-09 DIAGNOSIS — E785 Hyperlipidemia, unspecified: Secondary | ICD-10-CM | POA: Diagnosis not present

## 2022-08-09 DIAGNOSIS — E039 Hypothyroidism, unspecified: Secondary | ICD-10-CM | POA: Insufficient documentation

## 2022-08-09 DIAGNOSIS — C50811 Malignant neoplasm of overlapping sites of right female breast: Secondary | ICD-10-CM

## 2022-08-09 DIAGNOSIS — Z79810 Long term (current) use of selective estrogen receptor modulators (SERMs): Secondary | ICD-10-CM | POA: Diagnosis not present

## 2022-08-09 DIAGNOSIS — Z7901 Long term (current) use of anticoagulants: Secondary | ICD-10-CM | POA: Insufficient documentation

## 2022-08-09 DIAGNOSIS — Z170 Estrogen receptor positive status [ER+]: Secondary | ICD-10-CM | POA: Insufficient documentation

## 2022-08-09 DIAGNOSIS — I110 Hypertensive heart disease with heart failure: Secondary | ICD-10-CM | POA: Diagnosis not present

## 2022-08-09 DIAGNOSIS — Z79899 Other long term (current) drug therapy: Secondary | ICD-10-CM | POA: Diagnosis not present

## 2022-08-09 DIAGNOSIS — C50411 Malignant neoplasm of upper-outer quadrant of right female breast: Secondary | ICD-10-CM | POA: Insufficient documentation

## 2022-08-09 DIAGNOSIS — Z85038 Personal history of other malignant neoplasm of large intestine: Secondary | ICD-10-CM | POA: Insufficient documentation

## 2022-08-09 DIAGNOSIS — I480 Paroxysmal atrial fibrillation: Secondary | ICD-10-CM | POA: Insufficient documentation

## 2022-08-09 LAB — CBC WITH DIFFERENTIAL (CANCER CENTER ONLY)
Abs Immature Granulocytes: 0.04 10*3/uL (ref 0.00–0.07)
Basophils Absolute: 0.1 10*3/uL (ref 0.0–0.1)
Basophils Relative: 1 %
Eosinophils Absolute: 0.4 10*3/uL (ref 0.0–0.5)
Eosinophils Relative: 4 %
HCT: 39.1 % (ref 36.0–46.0)
Hemoglobin: 12.9 g/dL (ref 12.0–15.0)
Immature Granulocytes: 0 %
Lymphocytes Relative: 12 %
Lymphs Abs: 1.1 10*3/uL (ref 0.7–4.0)
MCH: 31.4 pg (ref 26.0–34.0)
MCHC: 33 g/dL (ref 30.0–36.0)
MCV: 95.1 fL (ref 80.0–100.0)
Monocytes Absolute: 0.6 10*3/uL (ref 0.1–1.0)
Monocytes Relative: 6 %
Neutro Abs: 7.5 10*3/uL (ref 1.7–7.7)
Neutrophils Relative %: 77 %
Platelet Count: 213 10*3/uL (ref 150–400)
RBC: 4.11 MIL/uL (ref 3.87–5.11)
RDW: 14 % (ref 11.5–15.5)
WBC Count: 9.8 10*3/uL (ref 4.0–10.5)
nRBC: 0 % (ref 0.0–0.2)

## 2022-08-09 LAB — CMP (CANCER CENTER ONLY)
ALT: 5 U/L (ref 0–44)
AST: 11 U/L — ABNORMAL LOW (ref 15–41)
Albumin: 3.4 g/dL — ABNORMAL LOW (ref 3.5–5.0)
Alkaline Phosphatase: 51 U/L (ref 38–126)
Anion gap: 7 (ref 5–15)
BUN: 10 mg/dL (ref 8–23)
CO2: 25 mmol/L (ref 22–32)
Calcium: 9 mg/dL (ref 8.9–10.3)
Chloride: 104 mmol/L (ref 98–111)
Creatinine: 0.77 mg/dL (ref 0.44–1.00)
GFR, Estimated: >60 mL/min (ref >60)
Glucose, Bld: 105 mg/dL — ABNORMAL HIGH (ref 70–99)
Potassium: 3.5 mmol/L (ref 3.5–5.1)
Sodium: 136 mmol/L (ref 135–145)
Total Bilirubin: 0.6 mg/dL (ref 0.3–1.2)
Total Protein: 6.2 g/dL — ABNORMAL LOW (ref 6.5–8.1)

## 2022-08-10 ENCOUNTER — Other Ambulatory Visit: Payer: Self-pay | Admitting: Nurse Practitioner

## 2022-08-10 MED ORDER — VENLAFAXINE HCL ER 37.5 MG PO CP24: 37.5000 mg | ORAL_CAPSULE | Freq: Every day | ORAL | 3 refills | Status: DC

## 2022-08-10 MED ORDER — TAMOXIFEN CITRATE 20 MG PO TABS: 20.0000 mg | ORAL_TABLET | Freq: Every day | ORAL | 6 refills | Status: DC

## 2022-08-11 ENCOUNTER — Telehealth: Payer: Self-pay

## 2022-08-11 ENCOUNTER — Other Ambulatory Visit: Payer: Self-pay

## 2022-08-11 NOTE — Telephone Encounter (Signed)
[facility] called about the prescription sent in by Santiago Glad, NP for Venlafaxine XR.  The pharmacist Duloxetine 30mg  PO Daily which is prescribed by another provider at the pt's facility.  Duloxetine is not currently on the pt's medication profile in EPIC.  Updated pt's medication list in EPIC to reflect Duloxetine.  Explained to pharmacist that Lacie prescribed the Venlafaxine XR d/t pt c/o menopausal side effects from the Tamoxifen.  Pharmacist stated she will contact the prescribing provider for the Duloxetine to see if the medication can be discontinued or changed.  Notified Santiago Glad, NP of the pharmacy's call.

## 2022-08-26 ENCOUNTER — Encounter (HOSPITAL_COMMUNITY): Admission: RE | Admit: 2022-08-26 | Payer: Medicare Other | Source: Ambulatory Visit

## 2022-08-29 NOTE — Assessment & Plan Note (Deleted)
-  Bilateral breast cancer with right axillary and mediastinal adenopathy, stage IV, ER+/PR+/HER2- -she has had palpable right breast mass for about 5 years, even when she lived in [place]. This was seen on CT scan at time of colon cancer diagnosis in 2020, but patient declined work up at that time. -With new left breast nipple inversion, she proceeded with breast work up which showed larger right breast mass, extensive right axillary adnopathy, and a small left breast mass. Her 05/14/20 breast biopsy showed Invasive ductal carcinoma in right and left breast and positive right axillary LNs with grade 2-3 disease. -PET scan 05/29/20 showed: 2.9 cm mass in inferolateral right breast; numerous right axillary nodal metastases, additional high paratracheal nodal metastases; 2.4 cm mass in inferior left breast with mild hypermetabolism; no evidence of metastatic disease in abdomen/pelvis. -Because of the mediastinal lymphadenopathy, she is not a surgical candidate. She started tamoxifen, due to her osteoporosis (T score -2.4 with high risk hip fracture), on 06/02/20.  She tolerated very well.

## 2022-08-30 ENCOUNTER — Telehealth: Payer: Self-pay | Admitting: Hematology

## 2022-08-30 ENCOUNTER — Inpatient Hospital Stay: Payer: Medicare Other | Admitting: Hematology

## 2022-08-30 DIAGNOSIS — C50811 Malignant neoplasm of overlapping sites of right female breast: Secondary | ICD-10-CM

## 2022-09-06 ENCOUNTER — Telehealth: Payer: Self-pay | Admitting: Physician Assistant

## 2022-09-06 ENCOUNTER — Telehealth: Payer: Self-pay

## 2022-09-06 ENCOUNTER — Encounter (HOSPITAL_BASED_OUTPATIENT_CLINIC_OR_DEPARTMENT_OTHER): Payer: Self-pay | Admitting: Cardiology

## 2022-09-06 NOTE — Telephone Encounter (Signed)
Patient's son said that patient had a stroke yesterday and she is scheduled to see Tereso Newcomer on 6/28 and he wants to know if they can do a phone visit instead

## 2022-09-06 NOTE — Telephone Encounter (Addendum)
Pt son called the office to make [place] aware that he needed to cancel his mother PET scan and lab on 8/30 due to mother having a stroke and she may not make it. I let him know that I would cancel the appointment and Make Dr. Mosetta Putt and NP Clayborn Heron aware of what has transpired. Pt verbalized understanding.  Rondel Jumbo, CMA

## 2022-09-06 NOTE — Telephone Encounter (Signed)
Spoke with patient son Will per DPR. He states patient had a stroke on Saturday. The angio stated patient has an occlusion in one of her arteries. She is unable to come in to office on 09/08/22. He would like to have the visit turned into phone visit with him to discuss angio results and see what further steps need to be taken from a cardiac stand point. I did not see any notes from hospital admission. He did take picture of her mychart with angio results. I will forward to provider.

## 2022-09-06 NOTE — Telephone Encounter (Signed)
Can we get records for her recent admission for stroke? These records would be helpful for her visit. I do not seen anything in her [facility] chart or Care Everywhere. Please request all labs, EKGs, echocardiogram, CT/MRI, etc from her hospitalization. If she is unable to come in for in person visit, we can change to virtual. It may be somewhat limited and would need to bring her back in 2-3 mos instead of waiting 6 or 12. Tereso Newcomer, PA-C    09/06/2022 5:00 PM

## 2022-09-07 DIAGNOSIS — I480 Paroxysmal atrial fibrillation: Secondary | ICD-10-CM | POA: Insufficient documentation

## 2022-09-07 DIAGNOSIS — I050 Rheumatic mitral stenosis: Secondary | ICD-10-CM | POA: Insufficient documentation

## 2022-09-07 NOTE — Progress Notes (Deleted)
{Choose 1 Note Type (Video or Telephone):[phone]}   Date:  09/07/2022   ID:  Harman Mox, DOB 1933-05-07, MRN [id number] The patient was identified using 2 identifiers.  {Patient Location:[phone]::"Home"} {Provider [phone]::"Home Office"}  PCP:  Ashley Royalty, NP   [facility] Providers Cardiologist:  Armanda Magic, MD Electrophysiologist:  Lewayne Bunting, MD  Structural Heart:  Tonny Bollman, MD{ Click to update primary MD,subspecialty MD or APP then REFRESH:1}    Evaluation Performed:  {Choose Visit Type:[phone]::"Follow-Up Visit"}  Chief Complaint:  ***  Patient Profile:   Julie Mcgee is a 89 y.o. female with     Aortic stenosis s/p TAVR in 02/2018 w/ ? mean gradient Coronary artery disease, nonobstructive LHC 02/21/18:  pLAD 40, mLAD 40; RCA irregs (HFpEF) heart failure with preserved ejection fraction  TTE 08/13/21: s/p TAVR, mean 22 mmHg, DI 0.33, no AI; severe MAC, mod to severe MS (mean 10 mmHg), mod MR, mod to severe TR, severe pulm HTN, EF 70-75, no RWMA, mod LVH, NL RVSF, severe BAE, RAP 15 TTE 09/04/22 Smitty Cords, [place]): EF 60-65, mod LVH, no RWMA, Gr 2 DD, NL RVSF, sever LAE, RAP 8, mild to mod MR, mod MS, mean 7, mild TR, RVSP 50-59, NL TAVR function, mean AV 17 mmHg Paroxysmal atrial fibrillation  Complete Heart Block, s/p Pacemaker  Right Bundle Branch Block 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 History of Present Illness          {   Hosp FU Last seen by TT 05/2021 Admx to [facility] in [place] 8/24-8/25 w met encephalopathy 2/2 hypoxic resp failure from a/c CHF Presented w alt MS;  worsening aphasia, hypoxia hsT neg; K+ low; CXR w edema; Improved w IV Lasix No MRI due to PPM; CT w prob chronically occluded R VA; no L VA stenosis, ICA stenosis or LVO Echocardiogram w stable TAVR, normal EF,no clot     :1}  ROS:   Please see the history of present illness.    *** All other systems reviewed and are negative.   Prior CV studies:   The following studies were reviewed today:  ***  Labs/Other Tests and Data Reviewed:    EKG:  {EKG/Telemetry Strips Reviewed:[phone]}  Wt Readings from Last 3 Encounters:  08/09/22 155 lb 12.8 oz (70.7 kg)  10/12/21 172 lb 4 oz (78.1 kg)  10/04/21 180 lb (81.6 kg)   Risk Assessment/Calculations:   {Does this patient have ATRIAL FIBRILLATION?:[phone]}     Objective:    Vital Signs:  There were no vitals taken for this visit.   {[facility] Virtual Exam (Optional):[phone]::"VITAL SIGNS:  reviewed"}  ASSESSMENT & PLAN:   Assessment and Plan            ***      {Are you ordering a CV Procedure (e.g. stress test, cath, DCCV, TEE, etc)?   Press F2        :[id number]}   Time:   Today, I have spent *** minutes with the patient with telehealth technology discussing the above problems.     Medication Adjustments/Labs and Tests Ordered: Current medicines are reviewed at length with the patient  today.  Concerns regarding medicines are outlined above.   Tests Ordered: No orders of the defined types were placed in this encounter.   Medication Changes: No orders of the defined types were placed in this encounter.   Follow Up:  {F/U Format:[phone]} {follow up:15908}  Signed, Tereso Newcomer, PA-C  09/07/2022 10:11 PM    [facility]

## 2022-09-07 NOTE — Telephone Encounter (Signed)
Call placed to Verlin Dike, [place], DPR on file.  He advised pt was at [facility].  Pt would not be able to come in for appt, however, he would like a phone appointment with him and his wife, informational purposes only.  He was advised that if we did a phone appointment, he would have to be with the pt since it was scheduled for pt.    He is aware I will call request records from [facility], [facility] and speak with Lorin Picket and see if he will do a phone call with him and wife and will call him back

## 2022-09-07 NOTE — Telephone Encounter (Signed)
Records received from [facility], [facility] and placed on [facility].    Will await recommendations re:  phone appointment.

## 2022-09-08 ENCOUNTER — Ambulatory Visit: Payer: Medicare Other | Admitting: Internal Medicine

## 2022-09-08 ENCOUNTER — Ambulatory Visit: Payer: Medicare Other | Admitting: Physician Assistant

## 2022-09-08 DIAGNOSIS — I350 Nonrheumatic aortic (valve) stenosis: Secondary | ICD-10-CM

## 2022-09-08 DIAGNOSIS — I5032 Chronic diastolic (congestive) heart failure: Secondary | ICD-10-CM

## 2022-09-08 DIAGNOSIS — I480 Paroxysmal atrial fibrillation: Secondary | ICD-10-CM

## 2022-09-08 DIAGNOSIS — I050 Rheumatic mitral stenosis: Secondary | ICD-10-CM

## 2022-09-08 NOTE — Telephone Encounter (Signed)
Son aware that we can only do an appointment with pt present.  Per son, they put pt in Hospice yesterday and he requested to just cancel the appointment.

## 2022-09-08 NOTE — Telephone Encounter (Signed)
Records reviewed from [facility]. Pt admx 8/24-8/25 w altered mental status, worsening aphasia, hypoxia. CXR with + edema. CT with poss chronic occlusion of R VA. There was no intracranial large vessel occlusion. No MRI done b/c of PPM. Echocardiogram with normal EF, mod MS. Symptoms improved with Lasix and presentation felt to be c/w metabolic encephalopathy due to hypoxic respiratory failure from decompensated CHF. Tereso Newcomer, PA-C    09/08/2022 1:15 PM

## 2022-09-10 ENCOUNTER — Other Ambulatory Visit (HOSPITAL_COMMUNITY): Payer: Medicare Other

## 2022-09-15 ENCOUNTER — Telehealth: Payer: Medicare Other | Admitting: Hematology

## 2022-10-05 ENCOUNTER — Encounter (HOSPITAL_BASED_OUTPATIENT_CLINIC_OR_DEPARTMENT_OTHER): Payer: Self-pay | Admitting: Cardiology

## 2022-10-05 ENCOUNTER — Encounter: Payer: Self-pay | Admitting: Nurse Practitioner

## 2022-10-05 ENCOUNTER — Encounter: Payer: Self-pay | Admitting: Internal Medicine

## 2022-10-07 ENCOUNTER — Other Ambulatory Visit: Payer: Self-pay

## 2022-10-12 DEATH — deceased

## 2022-11-03 ENCOUNTER — Telehealth: Payer: Self-pay | Admitting: Internal Medicine

## 2022-11-03 NOTE — Telephone Encounter (Signed)
I ordered the patient a return kit. He should receive it in 7-10 business days.

## 2022-11-03 NOTE — Telephone Encounter (Signed)
Son calling to see what to do with pt's device since deceased. Pt's son made aware that return kit with be sent out 7-10 days.

## 2022-11-11 ENCOUNTER — Ambulatory Visit: Payer: Medicare Other | Admitting: Hematology

## 2022-11-11 ENCOUNTER — Other Ambulatory Visit: Payer: Medicare Other

## 2023-12-17 IMAGING — CT CT HEAD W/O CM
4 of 5 series · 15 of 47 positions shown, 17 images · non-contrast
Comparison: 11/01/2018

CLINICAL DATA: Head trauma, minor (Age >= 65y), fall



[Series 3: head without · axial · non-contrast · 0.44mm/px · z∈[-121,-26]mm · 4 of 33 slices shown]
[im 7/33  brain]
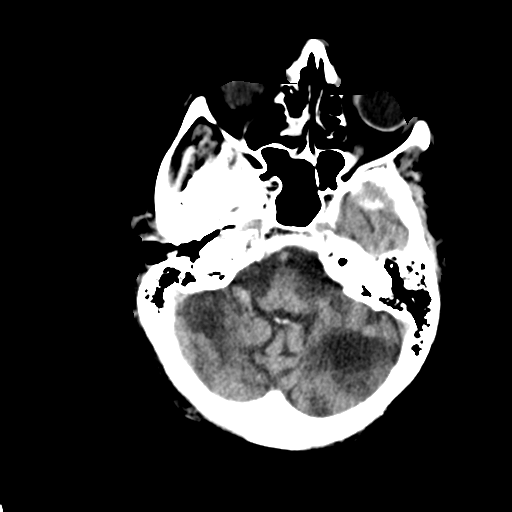
[im 13/33  brain]
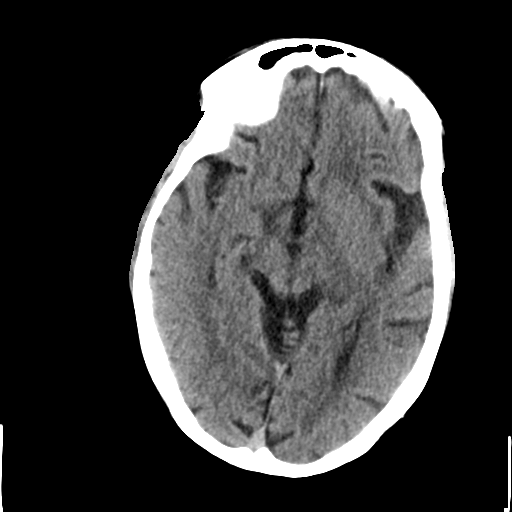
[im 20/33  brain]
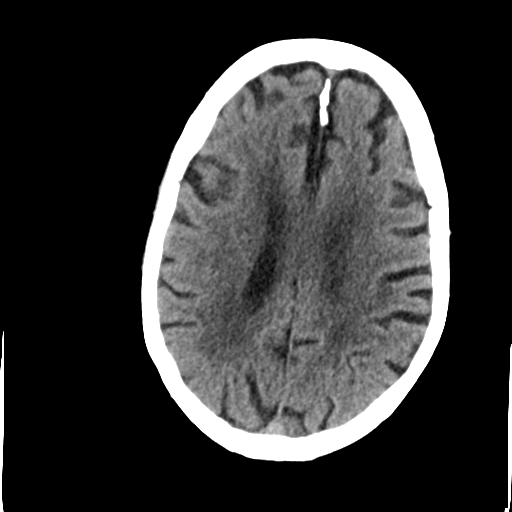
[im 26/33  brain]
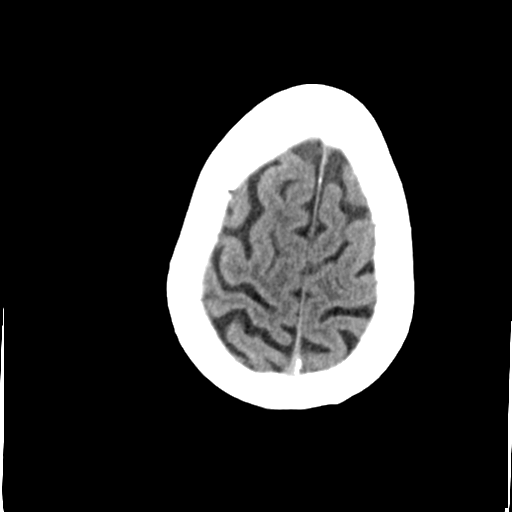

[Series 5: head without cor · coronal · non-contrast · 0.33mm/px · 3 of 72 slices shown]
[im 24/72  brain]
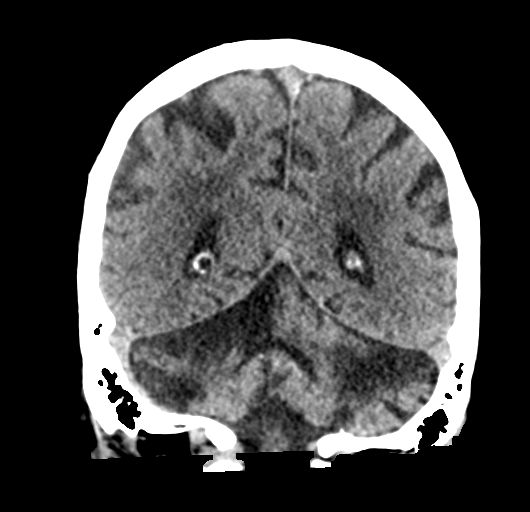
[im 32/72  brain]
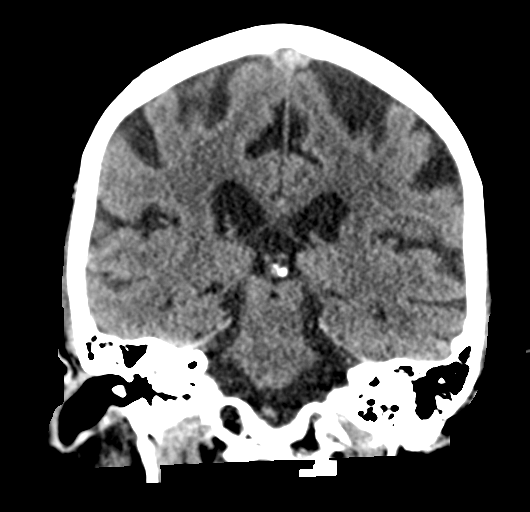
[im 40/72  brain]
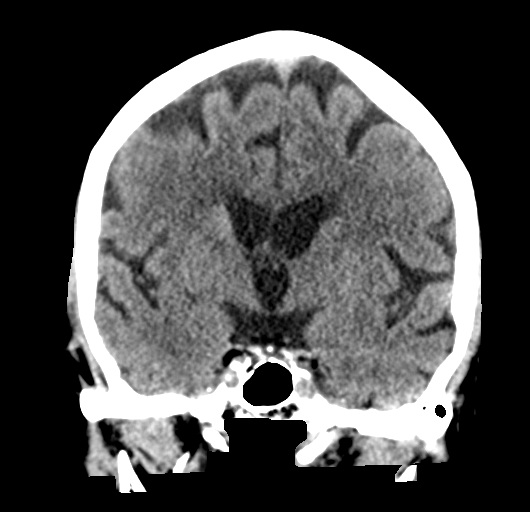

[Series 6: head without sag · sagittal · non-contrast · 0.31mm/px · 3 of 56 slices shown]
[im 19/56  brain]
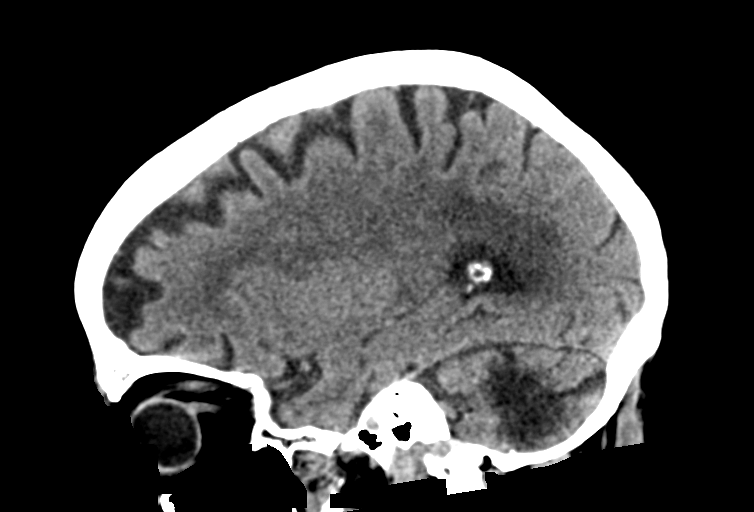
[im 28/56  brain]
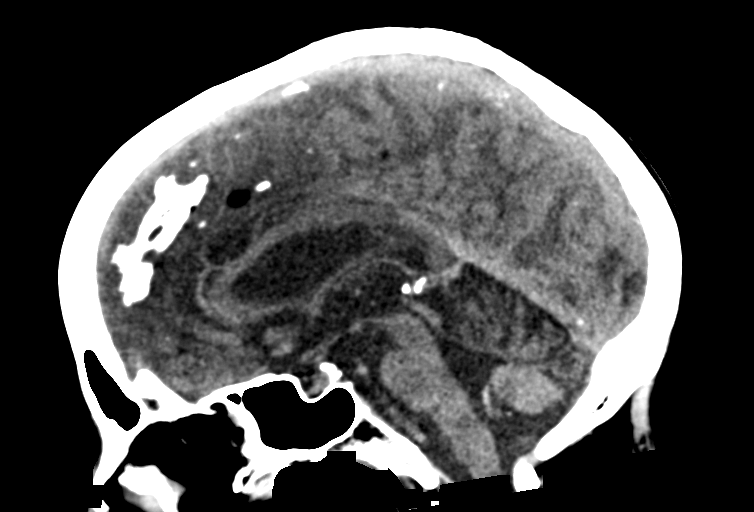
[im 37/56  brain]
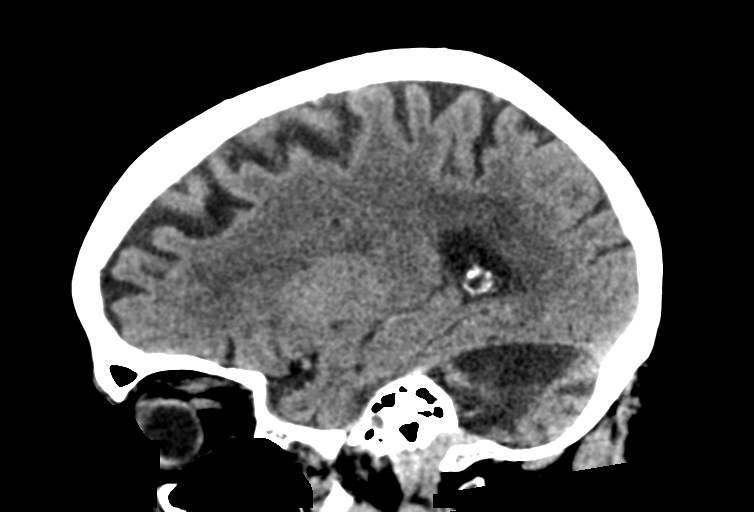

[Series 7: true axial · axial · 0.35mm/px · z∈[-99,+4]mm · 5 of 33 slices shown, 7 images]
[im 6/33  brain]
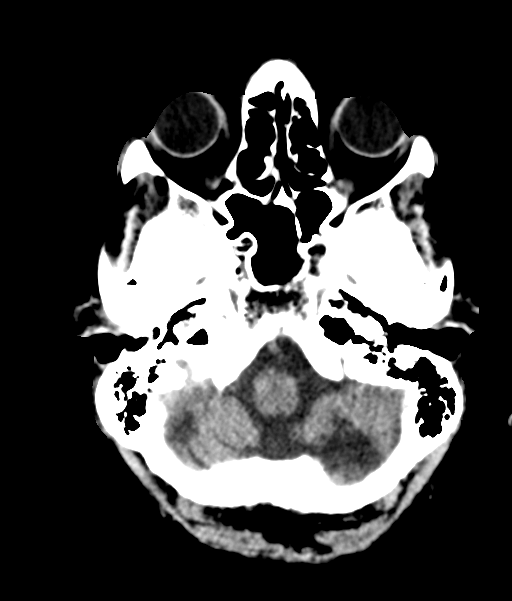
[im 6/33  bone]
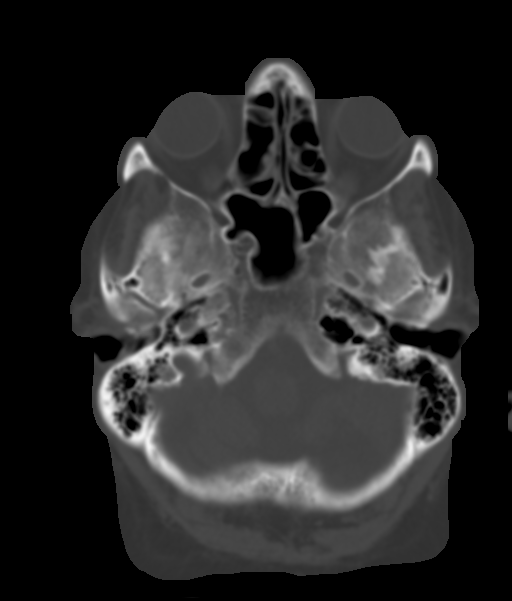
[im 11/33  brain]
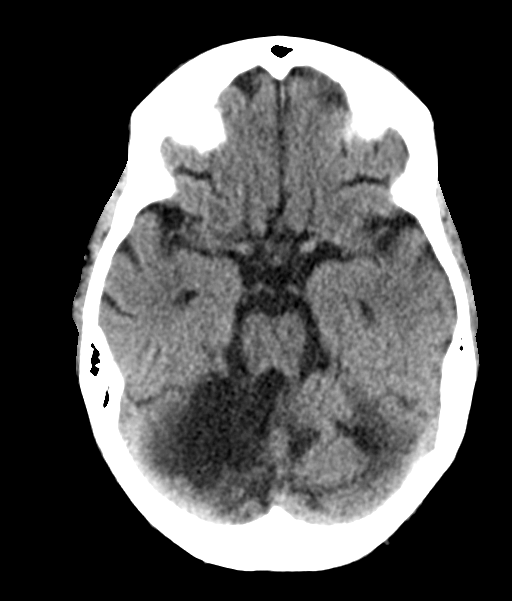
[im 17/33  brain]
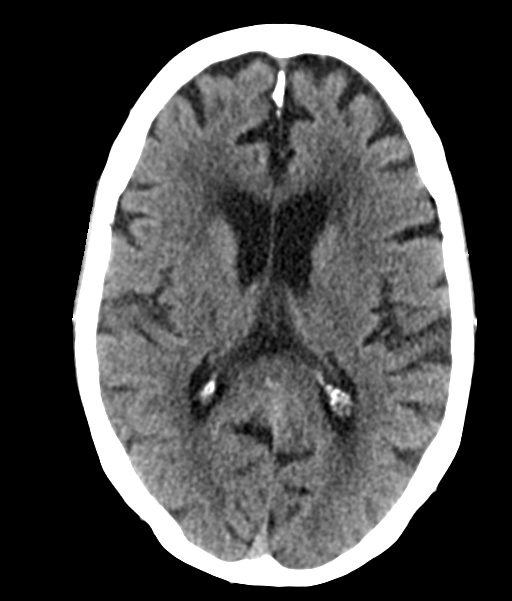
[im 22/33  brain]
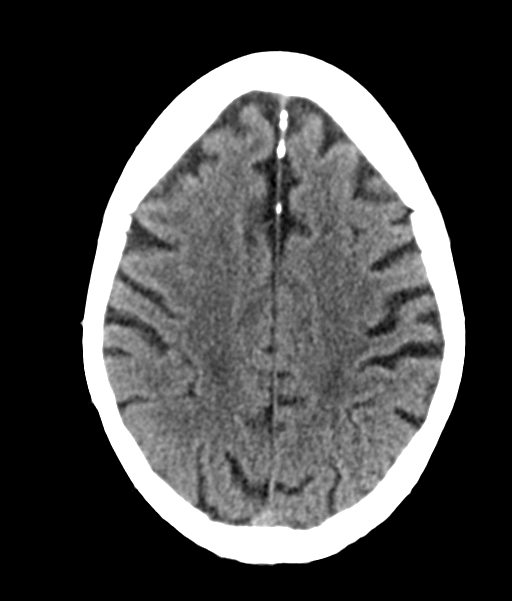
[im 27/33  brain]
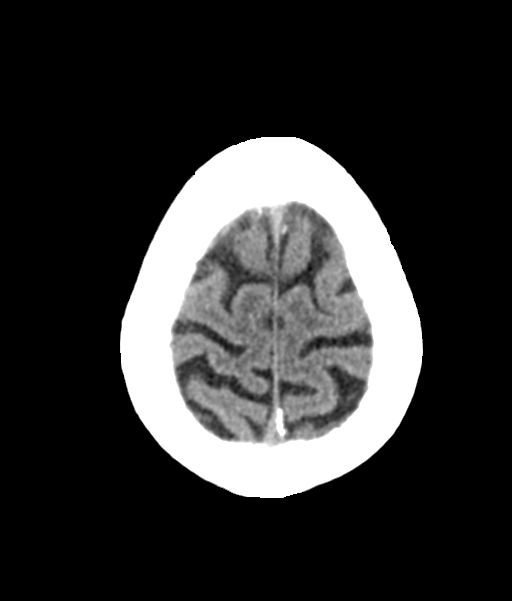
[im 27/33  bone]
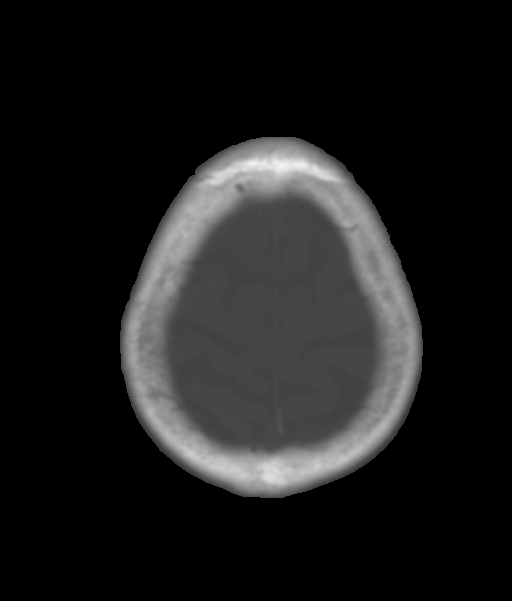

[15 of 47 positions shown; findings below may reference images not displayed]

FINDINGS: Brain: There is no acute intracranial hemorrhage, mass effect, or
edema. No new loss of gray-white differentiation. Large chronic
bilateral cerebellar infarcts. But areas of low-density in the
supratentorial white matter probably reflect moderate chronic
microvascular ischemic changes. There is no extra-axial fluid
collection. Prominence of the ventricles and sulci reflects
parenchymal volume loss.

Vascular: There is atherosclerotic calcification at the skull base.

Skull: Calvarium is unremarkable.

Sinuses/Orbits: No acute finding.

Other: None.
IMPRESSION: No evidence of acute intracranial injury.

## 2023-12-17 IMAGING — CR DG ANKLE COMPLETE 3+V*R*
3 series · 3 of 3 positions shown · non-contrast
Comparison: None.

CLINICAL DATA: Fall, right ankle pain

EXAM:
RIGHT ANKLE - COMPLETE 3+ VIEW

[ankle ap]
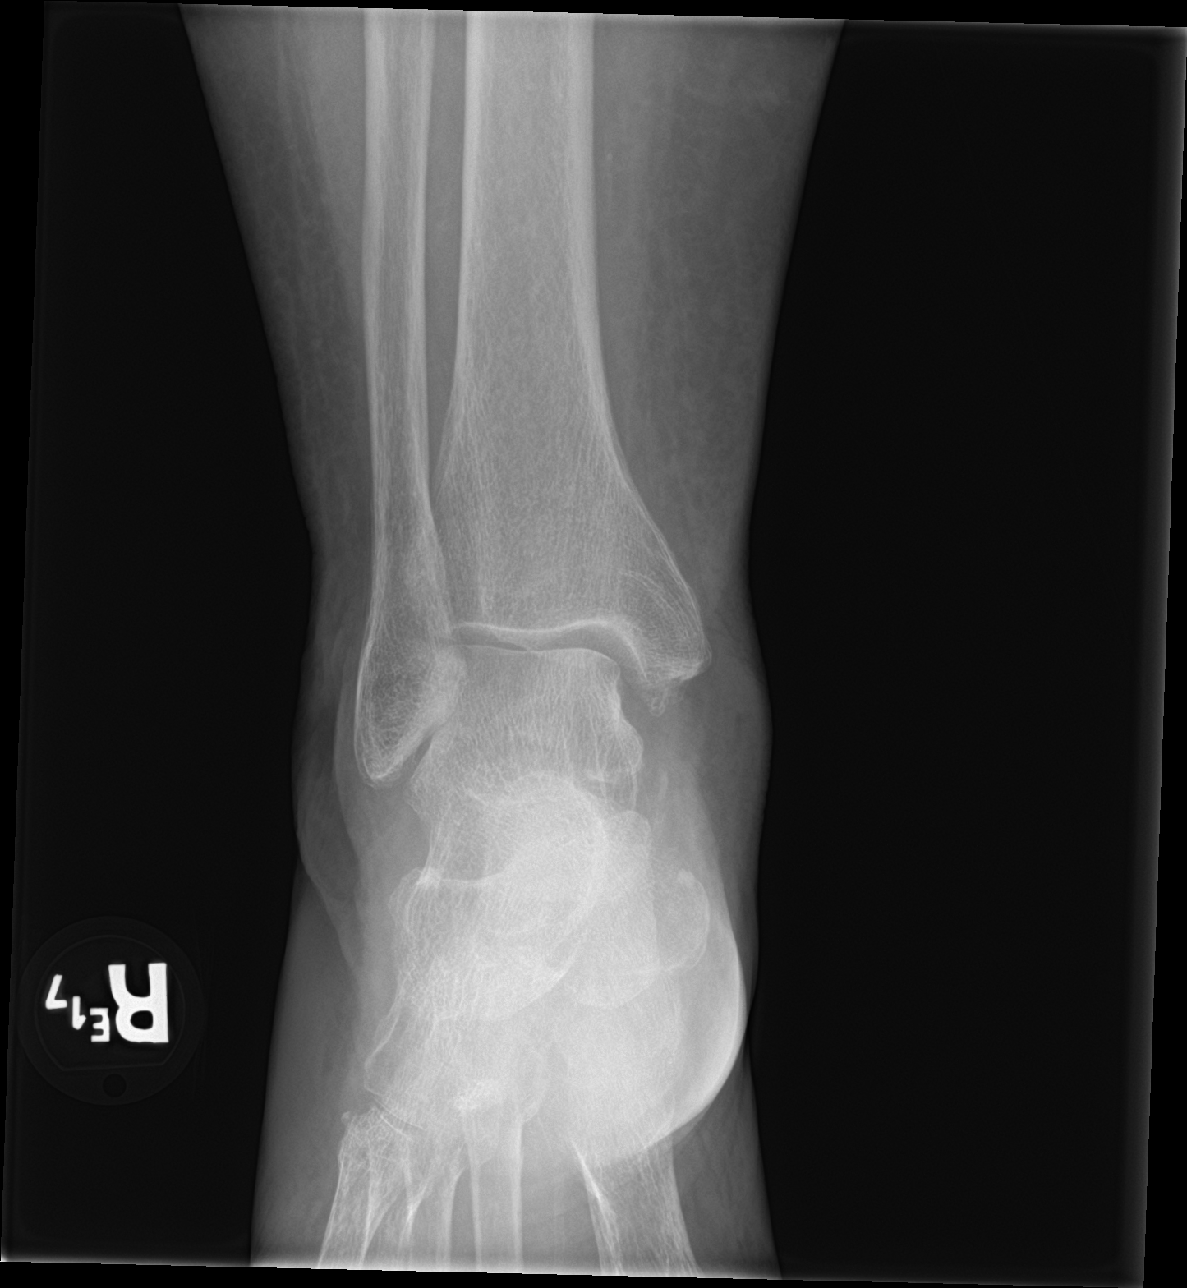

[ankle obl]
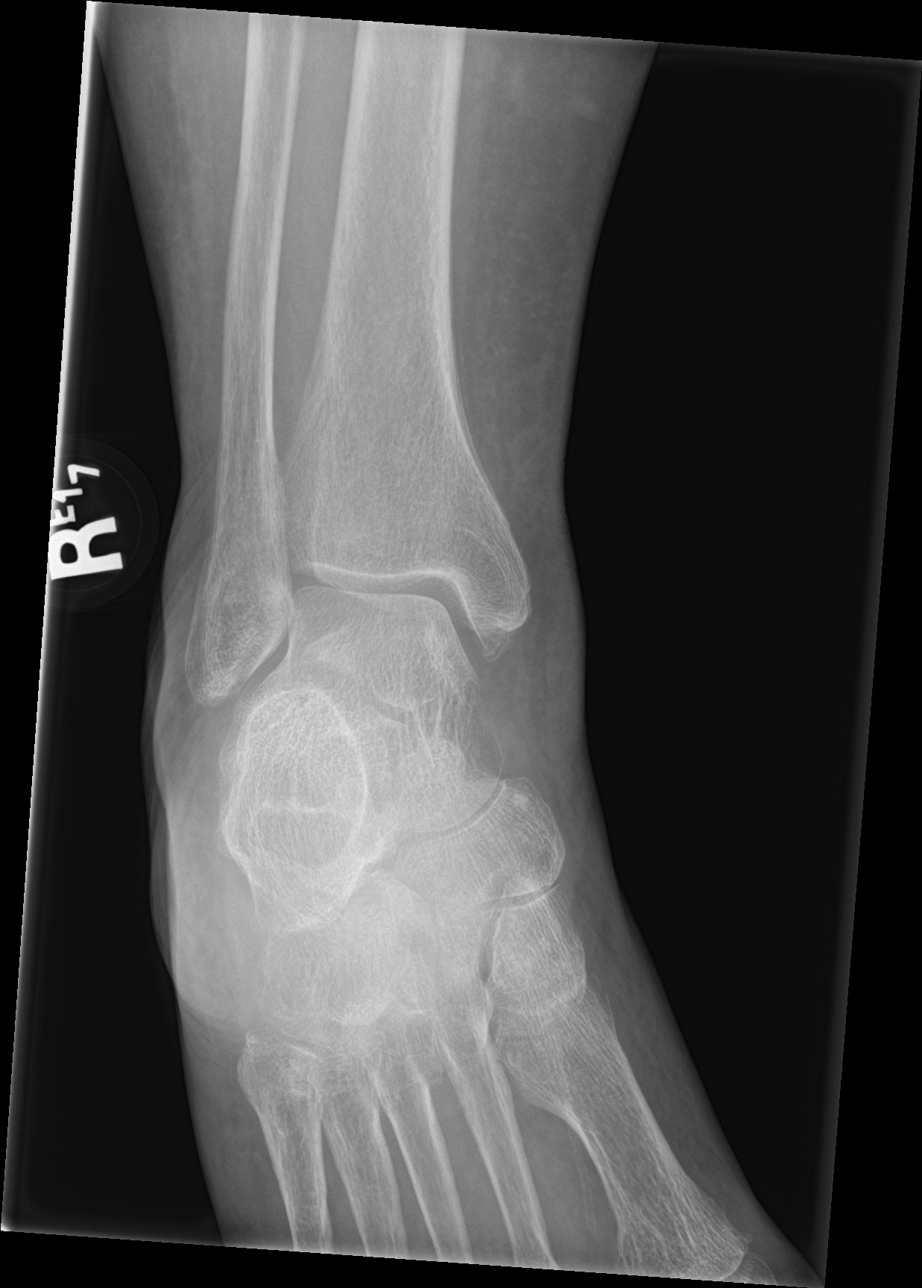

[ankle lat]
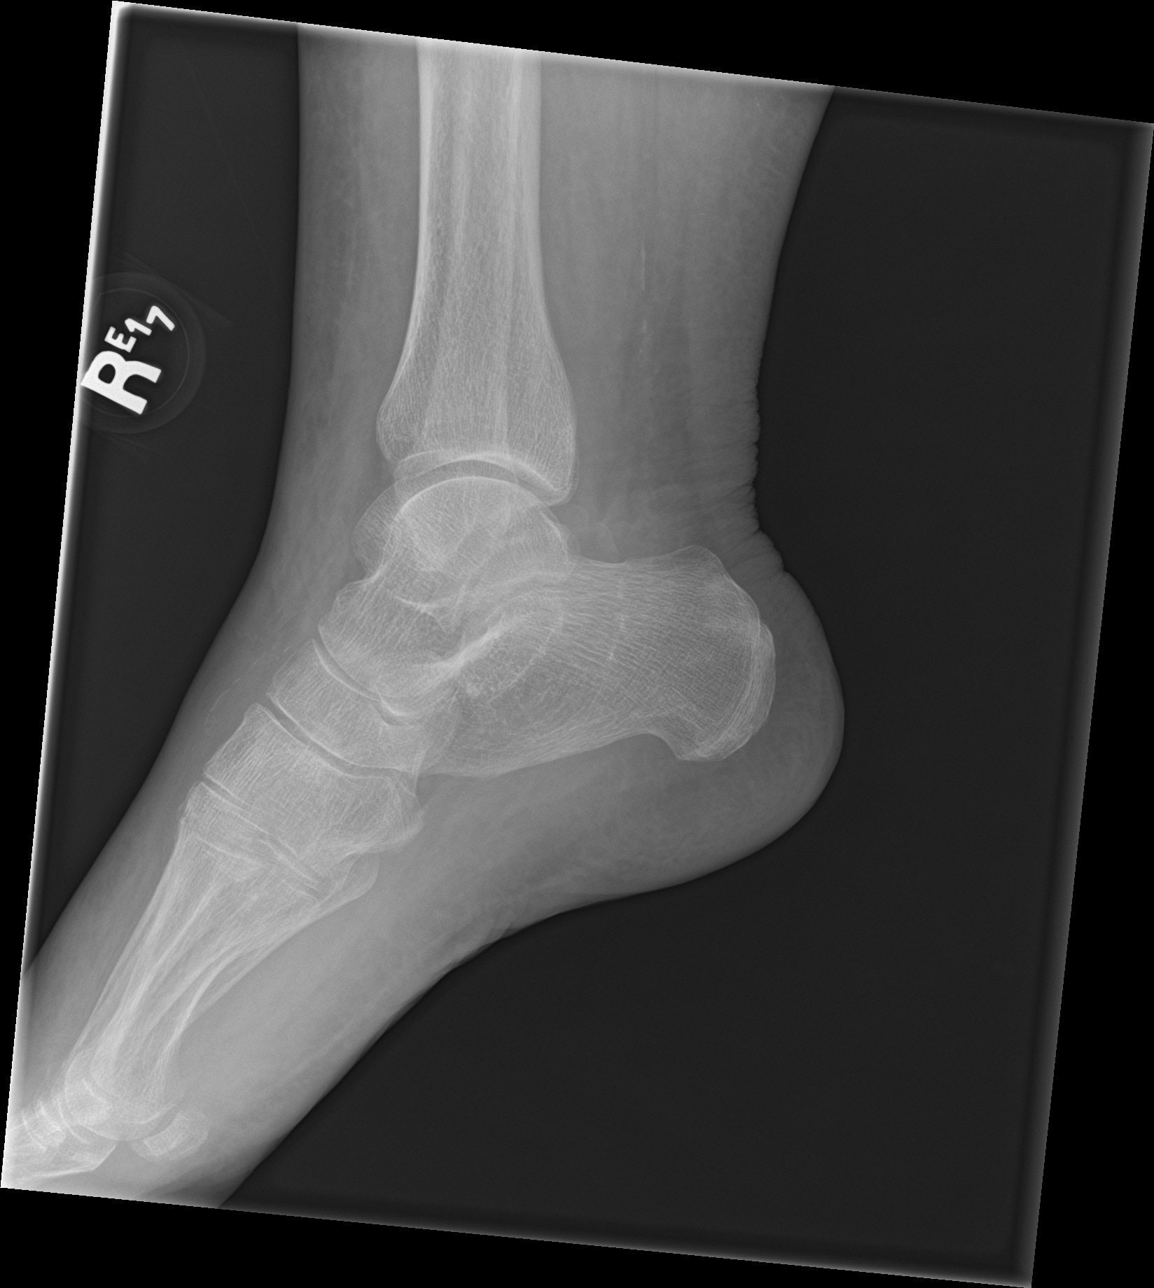

[3 of 3 positions shown; findings below may reference images not displayed]

FINDINGS: There is no evidence of fracture, dislocation, or joint effusion.
The ankle mortise is intact. There is no evidence of arthropathy or
other focal bone abnormality. Mild soft tissue swelling about the
ankle. Vascular calcifications.
IMPRESSION: No acute fracture or dislocation.

## 2023-12-17 IMAGING — DX DG FOOT COMPLETE 3+V*R*
3 series · 3 of 3 positions shown · non-contrast
Comparison: None.

CLINICAL DATA: Right foot pain

EXAM:
RIGHT FOOT COMPLETE - 3+ VIEW

[foot ap]
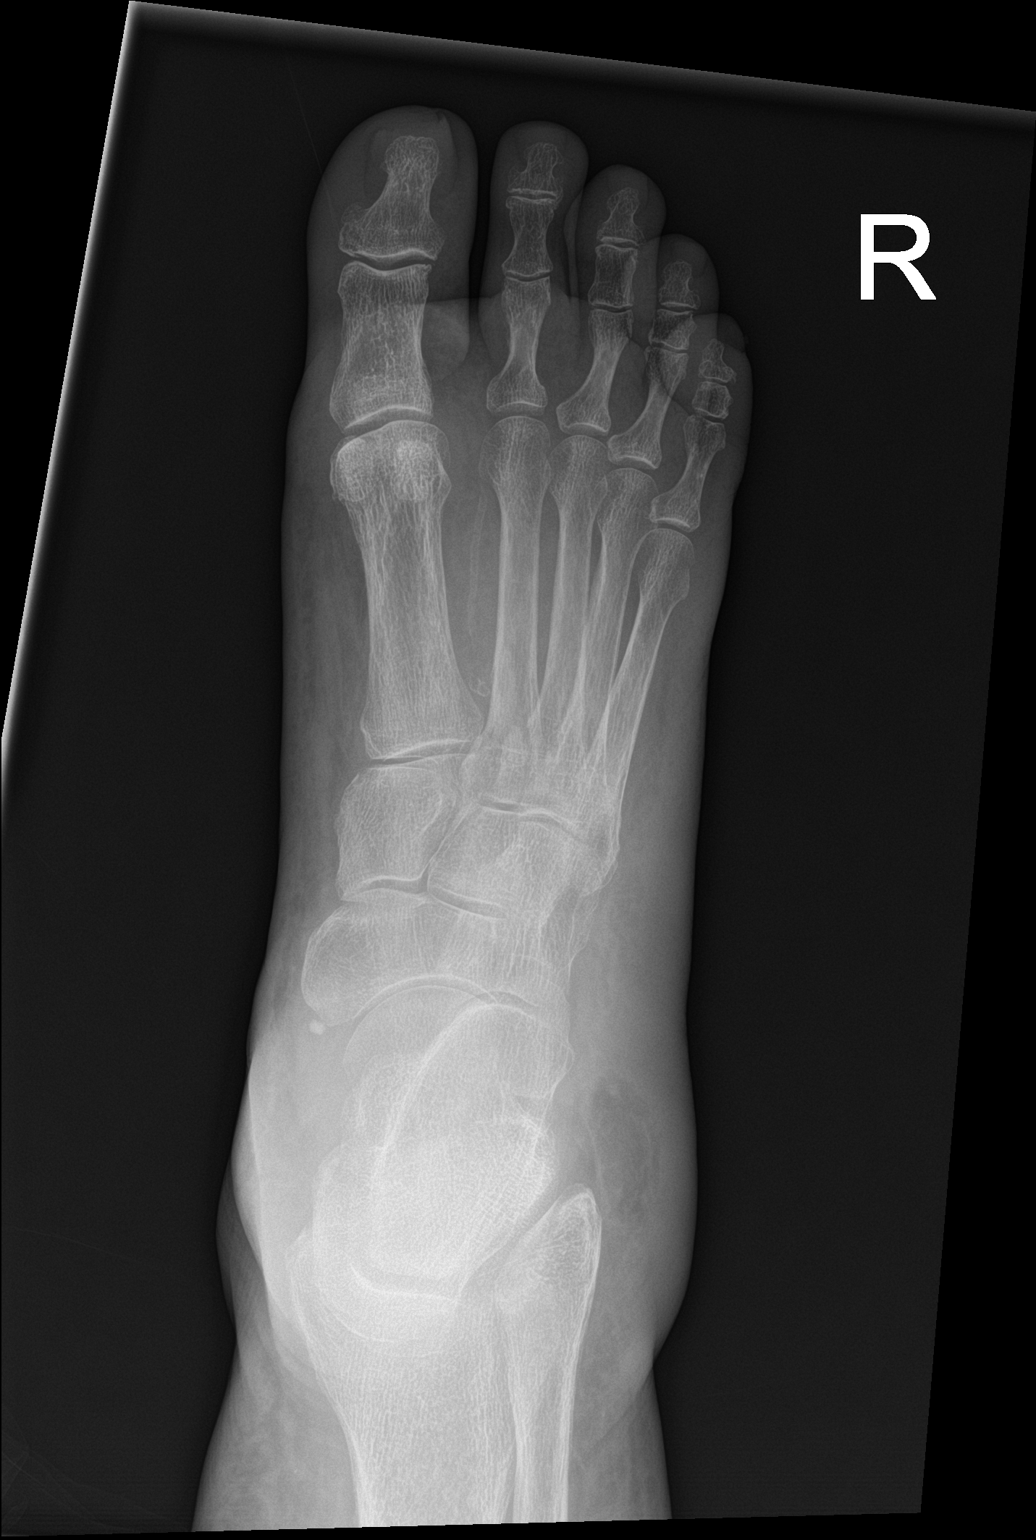

[foot obl]
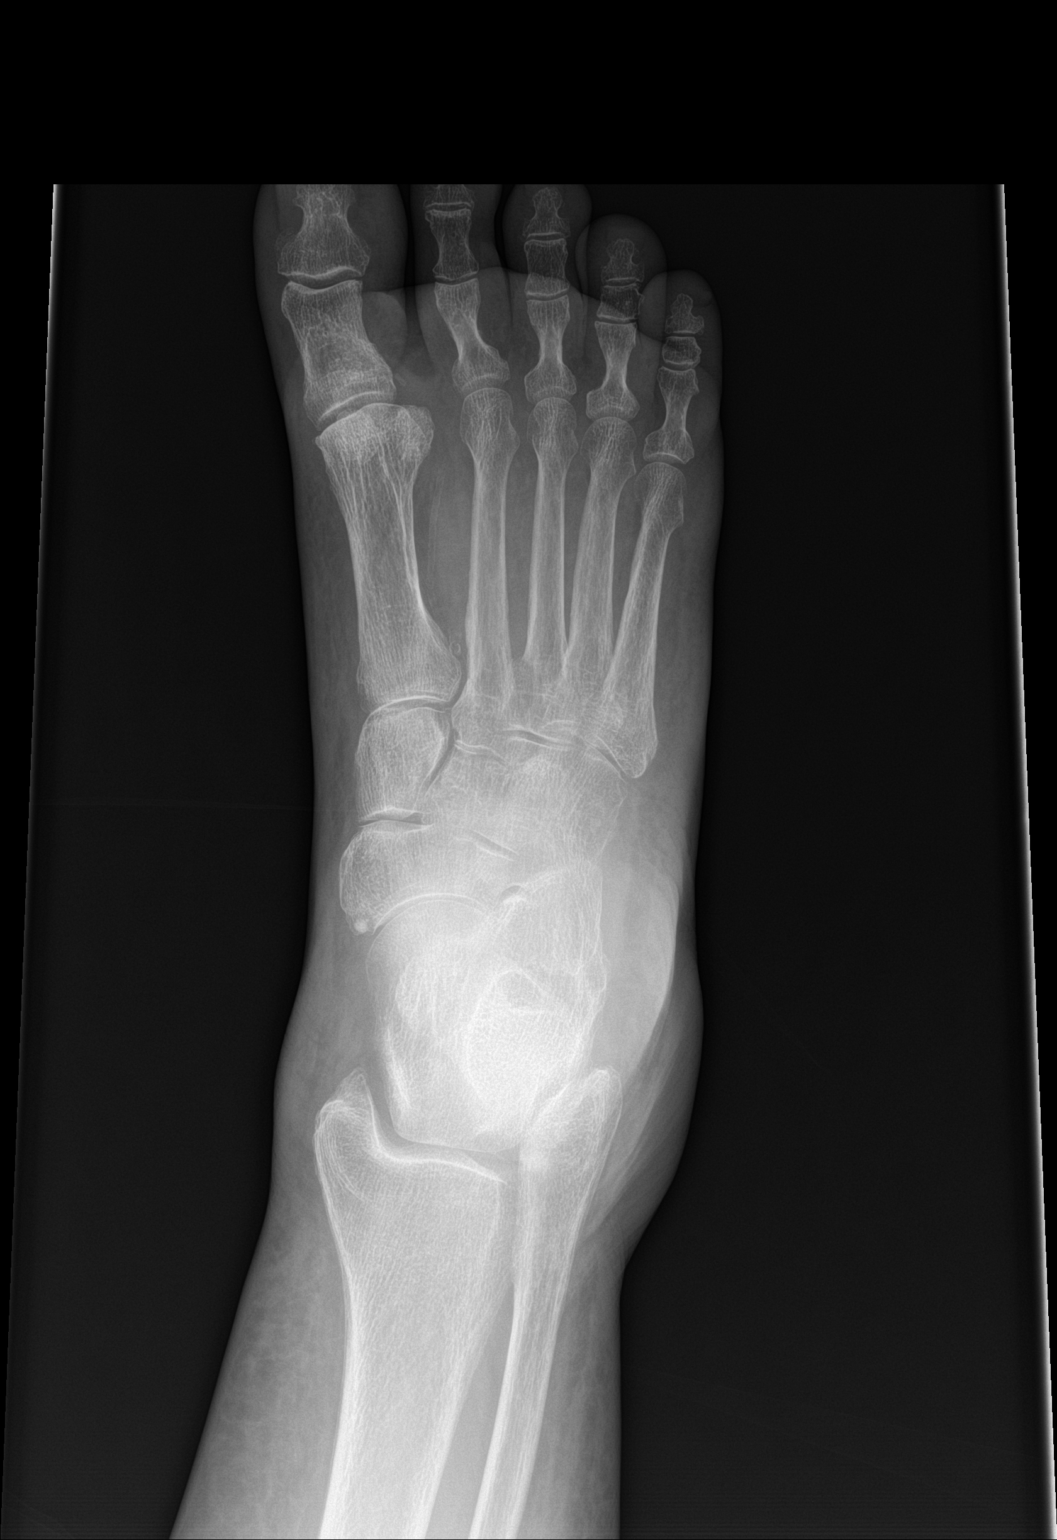

[foot lat]
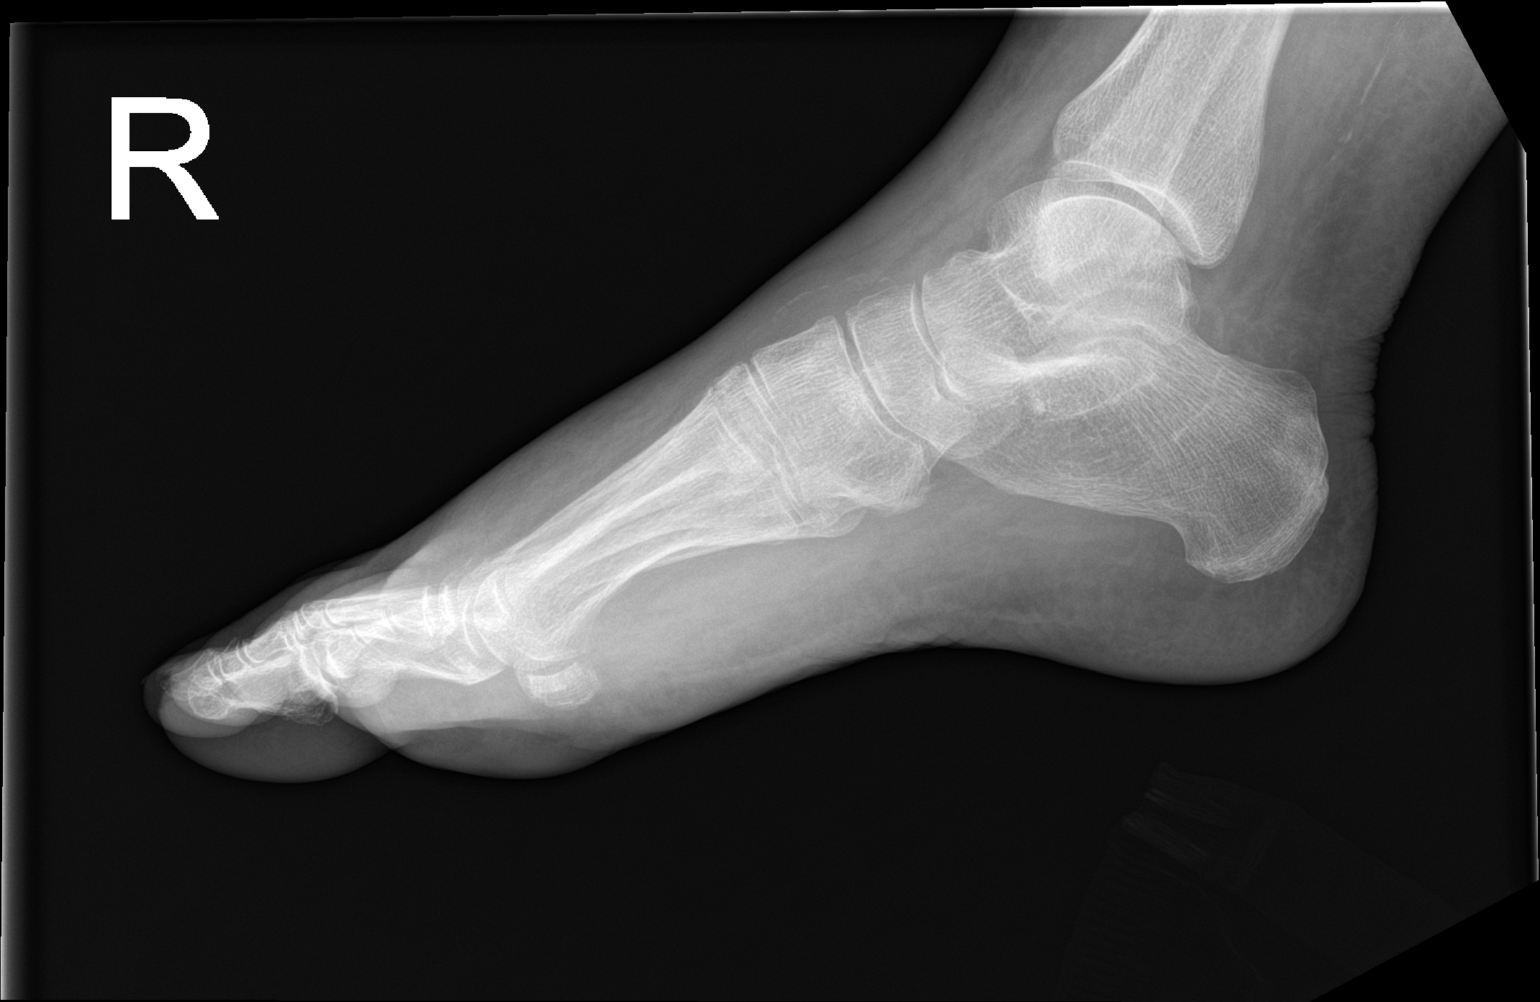

[3 of 3 positions shown; findings below may reference images not displayed]

FINDINGS: There is no evidence of fracture or dislocation. There is no
evidence of arthropathy or other focal bone abnormality. Soft
tissues are unremarkable.
IMPRESSION: Negative.
